# Patient Record
Sex: Male | Born: 1935 | Race: White | Hispanic: No | Marital: Married | Smoking: Former smoker
Health system: Southern US, Community
[De-identification: ages and names within clinical notes are randomized; demographics above are authoritative.]

## PROBLEM LIST (undated history)

## (undated) DIAGNOSIS — E1165 Type 2 diabetes mellitus with hyperglycemia: Secondary | ICD-10-CM

## (undated) DIAGNOSIS — G4730 Sleep apnea, unspecified: Secondary | ICD-10-CM

## (undated) DIAGNOSIS — E785 Hyperlipidemia, unspecified: Secondary | ICD-10-CM

## (undated) DIAGNOSIS — Z87442 Personal history of urinary calculi: Secondary | ICD-10-CM

## (undated) DIAGNOSIS — R251 Tremor, unspecified: Secondary | ICD-10-CM

## (undated) DIAGNOSIS — I429 Cardiomyopathy, unspecified: Secondary | ICD-10-CM

## (undated) DIAGNOSIS — K5790 Diverticulosis of intestine, part unspecified, without perforation or abscess without bleeding: Secondary | ICD-10-CM

## (undated) DIAGNOSIS — G4710 Hypersomnia, unspecified: Secondary | ICD-10-CM

## (undated) DIAGNOSIS — K922 Gastrointestinal hemorrhage, unspecified: Secondary | ICD-10-CM

## (undated) DIAGNOSIS — D649 Anemia, unspecified: Secondary | ICD-10-CM

## (undated) DIAGNOSIS — I2510 Atherosclerotic heart disease of native coronary artery without angina pectoris: Secondary | ICD-10-CM

## (undated) DIAGNOSIS — IMO0001 Reserved for inherently not codable concepts without codable children: Secondary | ICD-10-CM

## (undated) DIAGNOSIS — I10 Essential (primary) hypertension: Secondary | ICD-10-CM

## (undated) DIAGNOSIS — N400 Enlarged prostate without lower urinary tract symptoms: Secondary | ICD-10-CM

## (undated) DIAGNOSIS — E7800 Pure hypercholesterolemia, unspecified: Secondary | ICD-10-CM

## (undated) DIAGNOSIS — I219 Acute myocardial infarction, unspecified: Secondary | ICD-10-CM

## (undated) HISTORY — DX: Diverticulosis of intestine, part unspecified, without perforation or abscess without bleeding: K57.90

## (undated) HISTORY — PX: CARDIAC CATHETERIZATION: SHX172

## (undated) HISTORY — DX: Reserved for inherently not codable concepts without codable children: IMO0001

## (undated) HISTORY — PX: FLEXIBLE SIGMOIDOSCOPY: SHX1649

## (undated) HISTORY — PX: TONSILLECTOMY: SUR1361

## (undated) HISTORY — DX: Type 2 diabetes mellitus with hyperglycemia: E11.65

## (undated) HISTORY — PX: KNEE ARTHROSCOPY: SUR90

## (undated) HISTORY — PX: HERNIA REPAIR: SHX51

## (undated) HISTORY — DX: Acute myocardial infarction, unspecified: I21.9

## (undated) HISTORY — PX: OTHER SURGICAL HISTORY: SHX169

## (undated) HISTORY — DX: Sleep apnea, unspecified: G47.30

## (undated) HISTORY — DX: Atherosclerotic heart disease of native coronary artery without angina pectoris: I25.10

## (undated) HISTORY — DX: Hyperlipidemia, unspecified: E78.5

## (undated) HISTORY — DX: Benign prostatic hyperplasia without lower urinary tract symptoms: N40.0

## (undated) HISTORY — DX: Hypersomnia, unspecified: G47.10

## (undated) HISTORY — DX: Essential (primary) hypertension: I10

## (undated) HISTORY — PX: EYE SURGERY: SHX253

## (undated) HISTORY — DX: Pure hypercholesterolemia, unspecified: E78.00

---

## 1992-06-28 HISTORY — PX: CHOLECYSTECTOMY: SHX55

## 1994-06-28 DIAGNOSIS — I219 Acute myocardial infarction, unspecified: Secondary | ICD-10-CM

## 1994-06-28 HISTORY — DX: Acute myocardial infarction, unspecified: I21.9

## 1994-06-28 HISTORY — PX: CORONARY ANGIOPLASTY: SHX604

## 2009-04-30 ENCOUNTER — Ambulatory Visit: Payer: Self-pay | Admitting: Urology

## 2009-08-05 ENCOUNTER — Ambulatory Visit: Payer: Self-pay | Admitting: Gastroenterology

## 2010-03-04 ENCOUNTER — Ambulatory Visit: Payer: Self-pay | Admitting: Urology

## 2010-03-11 ENCOUNTER — Ambulatory Visit: Payer: Self-pay | Admitting: Urology

## 2010-03-12 LAB — PATHOLOGY REPORT

## 2012-04-10 ENCOUNTER — Ambulatory Visit: Payer: Self-pay | Admitting: Ophthalmology

## 2012-04-10 LAB — POTASSIUM: Potassium: 3.9 mmol/L (ref 3.5–5.1)

## 2012-04-25 ENCOUNTER — Ambulatory Visit: Payer: Self-pay | Admitting: Ophthalmology

## 2012-05-12 ENCOUNTER — Ambulatory Visit: Payer: Self-pay | Admitting: Ophthalmology

## 2012-05-23 ENCOUNTER — Ambulatory Visit: Payer: Self-pay | Admitting: Ophthalmology

## 2012-08-07 DIAGNOSIS — N529 Male erectile dysfunction, unspecified: Secondary | ICD-10-CM | POA: Insufficient documentation

## 2012-08-07 DIAGNOSIS — N138 Other obstructive and reflux uropathy: Secondary | ICD-10-CM | POA: Insufficient documentation

## 2012-08-07 DIAGNOSIS — R972 Elevated prostate specific antigen [PSA]: Secondary | ICD-10-CM | POA: Insufficient documentation

## 2012-08-07 DIAGNOSIS — N401 Enlarged prostate with lower urinary tract symptoms: Secondary | ICD-10-CM | POA: Insufficient documentation

## 2013-06-28 DIAGNOSIS — K922 Gastrointestinal hemorrhage, unspecified: Secondary | ICD-10-CM

## 2013-06-28 HISTORY — DX: Gastrointestinal hemorrhage, unspecified: K92.2

## 2013-07-06 ENCOUNTER — Inpatient Hospital Stay: Payer: Self-pay | Admitting: Internal Medicine

## 2013-07-06 LAB — COMPREHENSIVE METABOLIC PANEL
ALBUMIN: 3.2 g/dL — AB (ref 3.4–5.0)
Alkaline Phosphatase: 85 U/L
Anion Gap: 4 — ABNORMAL LOW (ref 7–16)
BILIRUBIN TOTAL: 1 mg/dL (ref 0.2–1.0)
BUN: 17 mg/dL (ref 7–18)
CALCIUM: 8.8 mg/dL (ref 8.5–10.1)
CHLORIDE: 103 mmol/L (ref 98–107)
CREATININE: 1.3 mg/dL (ref 0.60–1.30)
Co2: 29 mmol/L (ref 21–32)
GFR CALC AF AMER: 58 — AB
GFR CALC NON AF AMER: 50 — AB
GLUCOSE: 182 mg/dL — AB (ref 65–99)
Osmolality: 278 (ref 275–301)
POTASSIUM: 4.2 mmol/L (ref 3.5–5.1)
SGOT(AST): 34 U/L (ref 15–37)
SGPT (ALT): 22 U/L (ref 12–78)
SODIUM: 136 mmol/L (ref 136–145)
TOTAL PROTEIN: 7.7 g/dL (ref 6.4–8.2)

## 2013-07-06 LAB — CBC
HCT: 34.9 % — ABNORMAL LOW (ref 40.0–52.0)
HGB: 11.9 g/dL — ABNORMAL LOW (ref 13.0–18.0)
MCH: 30.5 pg (ref 26.0–34.0)
MCHC: 34.1 g/dL (ref 32.0–36.0)
MCV: 89 fL (ref 80–100)
Platelet: 198 10*3/uL (ref 150–440)
RBC: 3.91 10*6/uL — AB (ref 4.40–5.90)
RDW: 14.3 % (ref 11.5–14.5)
WBC: 7.4 10*3/uL (ref 3.8–10.6)

## 2013-07-07 LAB — HEMOGLOBIN
HGB: 8.9 g/dL — ABNORMAL LOW (ref 13.0–18.0)
HGB: 8.6 g/dL — AB (ref 13.0–18.0)

## 2013-07-07 LAB — CBC WITH DIFFERENTIAL/PLATELET
Basophil #: 0 10*3/uL (ref 0.0–0.1)
Basophil %: 0.4 %
EOS ABS: 0.2 10*3/uL (ref 0.0–0.7)
EOS PCT: 1.4 %
HCT: 27.7 % — ABNORMAL LOW (ref 40.0–52.0)
HGB: 9.3 g/dL — ABNORMAL LOW (ref 13.0–18.0)
LYMPHS ABS: 1.6 10*3/uL (ref 1.0–3.6)
Lymphocyte %: 15 %
MCH: 29.8 pg (ref 26.0–34.0)
MCHC: 33.7 g/dL (ref 32.0–36.0)
MCV: 89 fL (ref 80–100)
MONO ABS: 1.4 x10 3/mm — AB (ref 0.2–1.0)
MONOS PCT: 12.5 %
Neutrophil #: 7.7 10*3/uL — ABNORMAL HIGH (ref 1.4–6.5)
Neutrophil %: 70.7 %
Platelet: 152 10*3/uL (ref 150–440)
RBC: 3.14 10*6/uL — ABNORMAL LOW (ref 4.40–5.90)
RDW: 14.4 % (ref 11.5–14.5)
WBC: 10.9 10*3/uL — AB (ref 3.8–10.6)

## 2013-07-07 LAB — BASIC METABOLIC PANEL
ANION GAP: 4 — AB (ref 7–16)
BUN: 20 mg/dL — AB (ref 7–18)
CALCIUM: 8.1 mg/dL — AB (ref 8.5–10.1)
Chloride: 107 mmol/L (ref 98–107)
Co2: 29 mmol/L (ref 21–32)
Creatinine: 1.23 mg/dL (ref 0.60–1.30)
EGFR (African American): >60
GFR CALC NON AF AMER: 54 — AB
GLUCOSE: 148 mg/dL — AB (ref 65–99)
Osmolality: 285 (ref 275–301)
Potassium: 3.9 mmol/L (ref 3.5–5.1)
Sodium: 140 mmol/L (ref 136–145)

## 2013-07-07 LAB — HEMATOCRIT
HCT: 26.1 % — AB (ref 40.0–52.0)
HCT: 25.6 % — ABNORMAL LOW (ref 40.0–52.0)
HCT: 25.2 % — AB (ref 40.0–52.0)

## 2013-07-08 DIAGNOSIS — I4891 Unspecified atrial fibrillation: Secondary | ICD-10-CM

## 2013-07-08 LAB — CBC WITH DIFFERENTIAL/PLATELET
BASOS ABS: 0.1 10*3/uL (ref 0.0–0.1)
Basophil %: 0.6 %
EOS PCT: 2.5 %
Eosinophil #: 0.3 10*3/uL (ref 0.0–0.7)
HCT: 25.9 % — ABNORMAL LOW (ref 40.0–52.0)
HGB: 8.8 g/dL — ABNORMAL LOW (ref 13.0–18.0)
Lymphocyte #: 1.6 10*3/uL (ref 1.0–3.6)
Lymphocyte %: 14.5 %
MCH: 30.2 pg (ref 26.0–34.0)
MCHC: 34.1 g/dL (ref 32.0–36.0)
MCV: 88 fL (ref 80–100)
MONO ABS: 1.5 x10 3/mm — AB (ref 0.2–1.0)
MONOS PCT: 13.6 %
NEUTROS PCT: 68.8 %
Neutrophil #: 7.7 10*3/uL — ABNORMAL HIGH (ref 1.4–6.5)
PLATELETS: 141 10*3/uL — AB (ref 150–440)
RBC: 2.93 10*6/uL — ABNORMAL LOW (ref 4.40–5.90)
RDW: 14.3 % (ref 11.5–14.5)
WBC: 11.2 10*3/uL — ABNORMAL HIGH (ref 3.8–10.6)

## 2013-07-08 LAB — BASIC METABOLIC PANEL
ANION GAP: 4 — AB (ref 7–16)
BUN: 12 mg/dL (ref 7–18)
CALCIUM: 8 mg/dL — AB (ref 8.5–10.1)
CO2: 28 mmol/L (ref 21–32)
CREATININE: 1.18 mg/dL (ref 0.60–1.30)
Chloride: 106 mmol/L (ref 98–107)
EGFR (African American): >60
EGFR (Non-African Amer.): 57 — ABNORMAL LOW
GLUCOSE: 146 mg/dL — AB (ref 65–99)
OSMOLALITY: 278 (ref 275–301)
POTASSIUM: 4 mmol/L (ref 3.5–5.1)
Sodium: 138 mmol/L (ref 136–145)

## 2013-07-08 LAB — TSH: Thyroid Stimulating Horm: 1.77 u[IU]/mL

## 2013-07-27 ENCOUNTER — Encounter: Payer: Self-pay | Admitting: Cardiovascular Disease

## 2013-07-27 ENCOUNTER — Encounter (INDEPENDENT_AMBULATORY_CARE_PROVIDER_SITE_OTHER): Payer: Self-pay

## 2013-07-27 ENCOUNTER — Ambulatory Visit (INDEPENDENT_AMBULATORY_CARE_PROVIDER_SITE_OTHER): Payer: Medicare Other | Admitting: Cardiovascular Disease

## 2013-07-27 VITALS — BP 120/54 | HR 76 | Ht 65.0 in | Wt 196.8 lb

## 2013-07-27 DIAGNOSIS — K922 Gastrointestinal hemorrhage, unspecified: Secondary | ICD-10-CM

## 2013-07-27 DIAGNOSIS — E118 Type 2 diabetes mellitus with unspecified complications: Secondary | ICD-10-CM

## 2013-07-27 DIAGNOSIS — E785 Hyperlipidemia, unspecified: Secondary | ICD-10-CM

## 2013-07-27 DIAGNOSIS — I5032 Chronic diastolic (congestive) heart failure: Secondary | ICD-10-CM

## 2013-07-27 DIAGNOSIS — I4891 Unspecified atrial fibrillation: Secondary | ICD-10-CM

## 2013-07-27 DIAGNOSIS — Z9861 Coronary angioplasty status: Secondary | ICD-10-CM

## 2013-07-27 DIAGNOSIS — I509 Heart failure, unspecified: Secondary | ICD-10-CM

## 2013-07-27 DIAGNOSIS — I2510 Atherosclerotic heart disease of native coronary artery without angina pectoris: Secondary | ICD-10-CM

## 2013-07-27 DIAGNOSIS — E669 Obesity, unspecified: Secondary | ICD-10-CM | POA: Insufficient documentation

## 2013-07-27 DIAGNOSIS — R0789 Other chest pain: Secondary | ICD-10-CM

## 2013-07-27 NOTE — Patient Instructions (Signed)
You are doing well. No medication changes were made.  Please call us if you have new issues that need to be addressed before your next appt.  Your physician wants you to follow-up in: 3 to 4 weeks

## 2013-07-27 NOTE — Assessment & Plan Note (Signed)
Encouraged him to stay on his diuretic regiment. Discussion about heart failure diet

## 2013-07-27 NOTE — Assessment & Plan Note (Signed)
We have encouraged continued exercise, careful diet management in an effort to lose weight. 

## 2013-07-27 NOTE — Assessment & Plan Note (Signed)
Long discussion about various options for him and his atrial fibrillation. Given recent GI bleed 2 weeks ago, he is not a good candidate for anticoagulation at this time. Heart rate is well controlled. Will repeat again in several weeks to discuss how he feels and whether he would like to pursue anticoagulation, possible TEE, and cardioversion

## 2013-07-27 NOTE — Assessment & Plan Note (Signed)
Seen by GI in the hospital. Suspected diverticulitis. We'll hold off on any anticoagulation at this time

## 2013-07-27 NOTE — Progress Notes (Signed)
Patient ID: [name], male    DOB: Sep 19, 1927, 80 y.o.   MRN: [id number]  HPI Comments: [name] is a 80 year old gentleman presents for new patient evaluation. He is a history of CAD, angioplasty in August 1996. He had a recent hospitalization 07/07/2013 for diverticular bleed. He was evaluated by cardiology during this hospital admission for atrial fibrillation. He was given antibiotics for diverticulitis. Seen by Dr. Mechele Collin. No colonoscopy was performed. He was not started on anticoagulation by cardiology given his GI bleeding. He was started on other medications for heart rate control. He was discharged and presents today for followup.  He reports having dizziness while playing golf 05/16/2013. He felt he was having the flu. Seen by Dr. Dareen Piano and given prednisone with antibiotics. Uncertain if symptoms improved. Developed blood per rectum in January 2015 . He was anemic in the hospital, hemoglobin in the 9 range  Notes provided shows echocardiogram 06/26/2013 showing ejection fraction 45-50%, right ventricular systolic pressure 68 mmHg. He was instructed to increase his torsemide to 40 mg alternating with 20 mg Repeat echocardiogram July 08 2013 showed right ventricular systolic pressure 49, normal ejection fraction  In followup today he reports that he feels better but still has some shortness of breath with malaise. He does report having some memory issues. Daughter presents with him today with his wife. He continues to take high-dose aspirin as he has been taking for many years.  Notes provided by patient today shows normal stress test March 2009, normal ejection fraction by echocardiogram March 2009 He had a PTCI of a diagonal branch and OM in 1996  Recent creatinine 1.23 with BUN 20, TSH 1.77, normal LFTs  EKG today shows atrial fibrillation with ventricular rate 7 6 beats per minute, no significant ST or T wave changes  Allergies; 2 Exforge in 2008 with tongue  swelling, rash     Outpatient Encounter Prescriptions as of 07/27/2013  Medication Sig  . aspirin 325 MG tablet Take 325 mg by mouth daily.  . carvedilol (COREG) 25 MG tablet Take 25 mg by mouth 2 (two) times daily with a meal.  . dutasteride (AVODART) 0.5 MG capsule Take 0.5 mg by mouth daily.  Marland Kitchen glipiZIDE (GLUCOTROL) 10 MG tablet Take 10 mg by mouth daily before breakfast.  . glucose blood test strip 1 each by Other route as needed for other. Use as instructed  . Insulin Detemir (LEVEMIR FLEXPEN) 100 UNIT/ML Pen Inject 12 Units into the skin daily at 10 pm.  . losartan (COZAAR) 100 MG tablet Take 100 mg by mouth daily.  Marland Kitchen lovastatin (MEVACOR) 10 MG tablet Take 10 mg by mouth at bedtime.  . Magnesium 200 MG TABS Take 200 mg by mouth daily.   . potassium chloride (K-DUR) 10 MEQ tablet Take 10 mEq by mouth daily.  . sitaGLIPtin-metformin (JANUMET) 50-1000 MG per tablet Take 1 tablet by mouth 2 (two) times daily with a meal.  . torsemide (DEMADEX) 20 MG tablet Takes 40 mg every two days.  . [DISCONTINUED] pioglitazone (ACTOS) 30 MG tablet Take 30 mg by mouth daily.     Review of Systems  HENT: Negative.   Eyes: Negative.   Respiratory: Positive for shortness of breath.   Cardiovascular: Negative.   Gastrointestinal: Negative.   Endocrine: Negative.   Musculoskeletal: Negative.   Skin: Negative.   Allergic/Immunologic: Negative.   Neurological: Positive for weakness.  Hematological: Negative.   Psychiatric/Behavioral: Negative.   All other systems reviewed and are negative.  BP 120/54  Pulse 76  Ht 5\' 5"  (1.651 m)  Wt 196 lb 12 oz (89.245 kg)  BMI 32.74 kg/m2  Physical Exam  Nursing note and vitals reviewed. Constitutional: He is oriented to person, place, and time. He appears well-developed and well-nourished.  HENT:  Head: Normocephalic.  Nose: Nose normal.  Mouth/Throat: Oropharynx is clear and moist.  Eyes: Conjunctivae are normal. Pupils are equal, round, and  reactive to light.  Neck: Normal range of motion. Neck supple. No JVD present.  Cardiovascular: Normal rate, regular rhythm, S1 normal, S2 normal, normal heart sounds and intact distal pulses.  Exam reveals no gallop and no friction rub.   No murmur heard. Pulmonary/Chest: Effort normal and breath sounds normal. No respiratory distress. He has no wheezes. He has no rales. He exhibits no tenderness.  Abdominal: Soft. Bowel sounds are normal. He exhibits no distension. There is no tenderness.  Musculoskeletal: Normal range of motion. He exhibits no edema and no tenderness.  Lymphadenopathy:    He has no cervical adenopathy.  Neurological: He is alert and oriented to person, place, and time. Coordination normal.  Skin: Skin is warm and dry. No rash noted. No erythema.  Psychiatric: He has a normal mood and affect. His behavior is normal. Judgment and thought content normal.      Assessment and Plan

## 2013-07-27 NOTE — Assessment & Plan Note (Signed)
Encouraged weight loss and diet

## 2013-07-27 NOTE — Assessment & Plan Note (Signed)
Encouraged him to stay on his lovastatin. Goal LDL less than 70 

## 2013-07-27 NOTE — Assessment & Plan Note (Signed)
He is provided images today that suggests angioplasty to the OM and diagonal vessel

## 2013-07-27 NOTE — Assessment & Plan Note (Signed)
Prior angioplasty in 1996 of the OM vessel, diagonal vessel. Denies any recent angina.

## 2013-08-17 ENCOUNTER — Ambulatory Visit (INDEPENDENT_AMBULATORY_CARE_PROVIDER_SITE_OTHER): Payer: Medicare Other | Admitting: Cardiovascular Disease

## 2013-08-17 ENCOUNTER — Encounter: Payer: Self-pay | Admitting: Cardiovascular Disease

## 2013-08-17 VITALS — BP 142/60 | HR 61 | Ht 65.0 in | Wt 193.8 lb

## 2013-08-17 DIAGNOSIS — E118 Type 2 diabetes mellitus with unspecified complications: Secondary | ICD-10-CM

## 2013-08-17 DIAGNOSIS — R002 Palpitations: Secondary | ICD-10-CM

## 2013-08-17 DIAGNOSIS — E785 Hyperlipidemia, unspecified: Secondary | ICD-10-CM

## 2013-08-17 DIAGNOSIS — I4891 Unspecified atrial fibrillation: Secondary | ICD-10-CM

## 2013-08-17 DIAGNOSIS — I509 Heart failure, unspecified: Secondary | ICD-10-CM

## 2013-08-17 DIAGNOSIS — R0602 Shortness of breath: Secondary | ICD-10-CM

## 2013-08-17 DIAGNOSIS — I2510 Atherosclerotic heart disease of native coronary artery without angina pectoris: Secondary | ICD-10-CM

## 2013-08-17 DIAGNOSIS — I5032 Chronic diastolic (congestive) heart failure: Secondary | ICD-10-CM

## 2013-08-17 NOTE — Assessment & Plan Note (Signed)
Currently with no symptoms of angina. No further workup at this time. Continue current medication regimen. 

## 2013-08-17 NOTE — Assessment & Plan Note (Signed)
Long discussion about management of his atrial fibrillation. He is symptomatic though uncertain if this is from atrial fibrillation or continued heart failure symptoms, or from anemia or combination of all of the above. He's not a good candidate at this time for aggressive anticoagulation and we have discussed this with him. Very little buffer as he continues to be very anemic. He was recently started on iron. We have recommended that we wait several weeks until blood count improves. It would certainly be his choice as to whether he would go on anticoagulation as this could set him back if he has another bleed. Ideally we could see how he feels once anemia improves, heart failure improves and if he feels okay, could possibly use low dose anticoagulation such as eliquis 2.5 g twice a day and have him stay in atrial fibrillation. Alternatively we would need anticoagulation with TEE cardioversion and one more month of anticoagulation following this (eliquis 5 mg BID)

## 2013-08-17 NOTE — Assessment & Plan Note (Signed)
We have encouraged continued exercise, careful diet management in an effort to lose weight. 

## 2013-08-17 NOTE — Assessment & Plan Note (Signed)
Recommended he increase his torsemide to 40 mg daily, decrease his fluid intake

## 2013-08-17 NOTE — Assessment & Plan Note (Signed)
Recommended he stay on his lovastatin. Goal LDL less than 70 

## 2013-08-17 NOTE — Progress Notes (Signed)
Patient ID: [name], male    DOB: 09/29/1927, 80 y.o.   MRN: [id number]  HPI Comments: [name] is a 80 year old gentleman presents for routine followup of his atrial fibrillation. He is a history of CAD, angioplasty in August 1996. PTCA of a diagonal branch and OM in 1996 He had a recent hospitalization 07/07/2013 for diverticular bleed, atrial fibrillation. He was given antibiotics for diverticulitis. Seen by Dr. Mechele CollinElliott. No colonoscopy was performed. He was not started on anticoagulation by cardiology given his GI bleeding.  He reports having dizziness while playing golf 05/16/2013. He felt he was having the flu. Seen by Dr. Dareen PianoAnderson and given prednisone with antibiotics. Uncertain if symptoms improved. Developed blood per rectum in January 2015 . He was anemic in the hospital, hemoglobin in the 9 range  Notes provided shows echocardiogram 06/26/2013 showing ejection fraction 45-50%, right ventricular systolic pressure 68 mmHg. He was instructed to increase his torsemide to 40 mg alternating with 20 mg Repeat echocardiogram July 08 2013 showed right ventricular systolic pressure 49, normal ejection fraction  In followup today, he reports that he continues to have leg edema. Hemoglobin is 8.7. Continues to alternate torsemide 40 mg with 20 mg. He does not like having occasional palpitations. Feels his energy is poor, overall feels weak. Wife reports that he's not doing any walking or exercise. He feels he has some abdominal swelling/bloating. He reports having obstructive sleep apnea, not wearing his mask. Reports that [facility] is supposed to get back in touch with him.    He does report having some memory issues.    normal stress test March 2009, normal ejection fraction by echocardiogram March 2009 He had a PTCA of a diagonal branch and OM in 1996  Recent creatinine 1.23 with BUN 20, TSH 1.77, normal LFTs total cholesterol 99, LDL 50, HDL 32, hemoglobin A1c 7.3, hemoglobin 8.7     EKG shows atrial fibrillation with ventricular rate 61 beats per minute, nonspecific ST abnormality  EKG today shows atrial fibrillation with ventricular rate 7 6 beats per minute, no significant ST or T wave changes  Allergies; 2 Exforge in 2008 with tongue swelling, rash     Outpatient Encounter Prescriptions as of 08/17/2013  Medication Sig  . aspirin 325 MG tablet Take 325 mg by mouth daily.  . carvedilol (COREG) 25 MG tablet Take 25 mg by mouth 2 (two) times daily with a meal.  . dutasteride (AVODART) 0.5 MG capsule Take 0.5 mg by mouth 3 (three) times a week.   Marland Kitchen. glipiZIDE (GLUCOTROL) 10 MG tablet Take 10 mg by mouth daily before breakfast.  . glucose blood test strip 1 each by Other route as needed for other. Use as instructed  . Insulin Detemir (LEVEMIR FLEXPEN) 100 UNIT/ML Pen Inject 12 Units into the skin daily at 10 pm.  . IRON, FERROUS GLUCONATE, PO Take 150 mg by mouth 2 (two) times daily.  Marland Kitchen. losartan (COZAAR) 100 MG tablet Take 100 mg by mouth daily.  Marland Kitchen. lovastatin (MEVACOR) 10 MG tablet Take 10 mg by mouth at bedtime.  . Magnesium 200 MG TABS Take 200 mg by mouth daily.   . potassium chloride (K-DUR) 10 MEQ tablet Take 10 mEq by mouth daily.  . sitaGLIPtin-metformin (JANUMET) 50-1000 MG per tablet Take 1 tablet by mouth 2 (two) times daily with a meal.  . torsemide (DEMADEX) 20 MG tablet Takes 40 mg every two days.    Review of Systems  HENT: Negative.   Eyes: Negative.  Respiratory: Positive for shortness of breath.   Cardiovascular: Positive for palpitations.  Gastrointestinal: Negative.   Endocrine: Negative.   Musculoskeletal: Negative.   Skin: Negative.   Allergic/Immunologic: Negative.   Neurological: Positive for weakness.  Hematological: Negative.   Psychiatric/Behavioral: Negative.   All other systems reviewed and are negative.    BP 142/60  Pulse 61  Ht 5\' 5"  (1.651 m)  Wt 193 lb 12.8 oz (87.907 kg)  BMI 32.25 kg/m2  Physical Exam  Nursing  note and vitals reviewed. Constitutional: He is oriented to person, place, and time. He appears well-developed and well-nourished.  HENT:  Head: Normocephalic.  Nose: Nose normal.  Mouth/Throat: Oropharynx is clear and moist.  Eyes: Conjunctivae are normal. Pupils are equal, round, and reactive to light.  Neck: Normal range of motion. Neck supple. No JVD present.  Cardiovascular: Normal rate, regular rhythm, S1 normal, S2 normal, normal heart sounds and intact distal pulses.  Exam reveals no gallop and no friction rub.   No murmur heard. Pulmonary/Chest: Effort normal and breath sounds normal. No respiratory distress. He has no wheezes. He has no rales. He exhibits no tenderness.  Abdominal: Soft. Bowel sounds are normal. He exhibits no distension. There is no tenderness.  Musculoskeletal: Normal range of motion. He exhibits no edema and no tenderness.  Lymphadenopathy:    He has no cervical adenopathy.  Neurological: He is alert and oriented to person, place, and time. Coordination normal.  Skin: Skin is warm and dry. No rash noted. No erythema.  Psychiatric: He has a normal mood and affect. His behavior is normal. Judgment and thought content normal.      Assessment and Plan

## 2013-08-17 NOTE — Patient Instructions (Signed)
Your shortness of breath is from anemia and extra fluid, possibly also from atrial fibrillation  We will wait until the next lab work to determine what to do with the atrial fibrillation Take torsemide 40 mg daily If breathing gets worse, leg swelling worse on any particular day,  take an extra torsemide 40 mg after lunch  If the blood count comes way back up,  We could consider a blood thinner for a short period   Walk around the house at least three times a day for at least 5 minutes for exercise (otherwise you will get weaker) Also do chair exercises, light weight  Please call us if you have new issues that need to be addressed before your next appt.  Your physician wants you to follow-up in: 1 month.

## 2013-09-14 ENCOUNTER — Ambulatory Visit: Payer: Medicare Other | Admitting: Cardiovascular Disease

## 2013-09-21 ENCOUNTER — Ambulatory Visit (INDEPENDENT_AMBULATORY_CARE_PROVIDER_SITE_OTHER): Payer: Medicare Other | Admitting: Cardiovascular Disease

## 2013-09-21 ENCOUNTER — Encounter: Payer: Self-pay | Admitting: Cardiovascular Disease

## 2013-09-21 VITALS — BP 122/60 | Ht 65.0 in | Wt 199.2 lb

## 2013-09-21 DIAGNOSIS — R0602 Shortness of breath: Secondary | ICD-10-CM

## 2013-09-21 DIAGNOSIS — E785 Hyperlipidemia, unspecified: Secondary | ICD-10-CM

## 2013-09-21 DIAGNOSIS — E669 Obesity, unspecified: Secondary | ICD-10-CM

## 2013-09-21 DIAGNOSIS — I509 Heart failure, unspecified: Secondary | ICD-10-CM

## 2013-09-21 DIAGNOSIS — E118 Type 2 diabetes mellitus with unspecified complications: Secondary | ICD-10-CM

## 2013-09-21 DIAGNOSIS — I2510 Atherosclerotic heart disease of native coronary artery without angina pectoris: Secondary | ICD-10-CM

## 2013-09-21 DIAGNOSIS — I5032 Chronic diastolic (congestive) heart failure: Secondary | ICD-10-CM

## 2013-09-21 DIAGNOSIS — K922 Gastrointestinal hemorrhage, unspecified: Secondary | ICD-10-CM

## 2013-09-21 DIAGNOSIS — I4891 Unspecified atrial fibrillation: Secondary | ICD-10-CM

## 2013-09-21 NOTE — Assessment & Plan Note (Signed)
Heart rate well controlled. Unable to start anticoagulation given recent GI bleed approximately 2 weeks ago. Bleeding on and off for several days. He does not want a thinner and I believe it is risky

## 2013-09-21 NOTE — Assessment & Plan Note (Signed)
Cholesterol is at goal on the current lipid regimen. No changes to the medications were made.  

## 2013-09-21 NOTE — Progress Notes (Signed)
Patient ID: [name], male    DOB: 12/12/1927, 80 y.o.   MRN: [id number]  HPI Comments: [name] is a 80 year old gentleman presents for routine followup of his atrial fibrillation. history of CAD, angioplasty in August 1996. PTCA of a diagonal branch and OM in 1996  hospitalization 07/07/2013 for diverticular bleed, atrial fibrillation. He was given antibiotics for diverticulitis. Seen by Dr. Mechele CollinElliott. No colonoscopy was performed. He was not started on anticoagulation by cardiology given his GI bleeding.   dizziness started while playing golf 05/16/2013. He felt he was having the flu. Seen by Dr. Dareen PianoAnderson and given prednisone with antibiotics. Uncertain if symptoms improved. Developed blood per rectum in January 2015 . He was anemic in the hospital, hemoglobin in the 9 range   echocardiogram 06/26/2013 showing ejection fraction 45-50%, right ventricular systolic pressure 68 mmHg. He was instructed to increase his torsemide to 40 mg alternating with 20 mg Repeat echocardiogram July 08 2013 showed right ventricular systolic pressure 49, normal ejection fraction  On his last clinic visit, he had continued leg edema, hemoglobin 8.7 He continued to alternate torsemide 40 mg with 20 mg. he felt weak at that time with abdominal bloating Poorly controlled obstructive sleep apnea, not wearing his mask.   In followup today, still not wearing CPAP mask, has daytime somnolence Leg edema about the same, he is now to torsemide 40 mg daily. Heart rate relatively well controlled. He is sedentary, not doing any regular exercise. Some shortness of breath with exertion he does report having recent (M. on March 22 lasting for several days on and off. Finally resolved. Latest hemoglobin 10.6 one week ago  He does report having some memory issues.    normal stress test March 2009, normal ejection fraction by echocardiogram March 2009 He had a PTCA of a diagonal branch and OM in 1996  EKG shows  atrial fibrillation with ventricular rate 54 beats per minute, nonspecific ST abnormality  Allergies; 2 Exforge in 2008 with tongue swelling, rash     Outpatient Encounter Prescriptions as of 09/21/2013  Medication Sig  . aspirin 325 MG tablet Take 325 mg by mouth daily.  . carvedilol (COREG) 25 MG tablet Take 25 mg by mouth 2 (two) times daily with a meal.  . dutasteride (AVODART) 0.5 MG capsule Take 0.5 mg by mouth 3 (three) times a week.   Marland Kitchen. glipiZIDE (GLUCOTROL) 10 MG tablet Take 10 mg by mouth daily before breakfast.  . glucose blood test strip 1 each by Other route as needed for other. Use as instructed  . Insulin Detemir (LEVEMIR FLEXPEN) 100 UNIT/ML Pen Inject 12 Units into the skin daily at 10 pm.  . IRON, FERROUS GLUCONATE, PO Take 150 mg by mouth 2 (two) times daily.  Marland Kitchen. losartan (COZAAR) 100 MG tablet Take 100 mg by mouth daily.  Marland Kitchen. lovastatin (MEVACOR) 10 MG tablet Take 10 mg by mouth at bedtime.  . Magnesium 200 MG TABS Take 200 mg by mouth daily.   . potassium chloride (K-DUR) 10 MEQ tablet Take 10 mEq by mouth daily.  . sitaGLIPtin-metformin (JANUMET) 50-1000 MG per tablet Take 1 tablet by mouth 2 (two) times daily with a meal.  . torsemide (DEMADEX) 20 MG tablet Takes 40 mg every day.    Review of Systems  HENT: Negative.   Eyes: Negative.   Respiratory: Positive for shortness of breath.   Cardiovascular: Positive for palpitations.  Gastrointestinal: Negative.   Endocrine: Negative.   Skin: Negative.   Allergic/Immunologic:  Negative.   Neurological: Positive for weakness.  Hematological: Negative.   Psychiatric/Behavioral: Negative.   All other systems reviewed and are negative.    BP 122/60  Ht 5\' 5"  (1.651 m)  Wt 199 lb 4 oz (90.379 kg)  BMI 33.16 kg/m2  Physical Exam  Nursing note and vitals reviewed. Constitutional: He is oriented to person, place, and time. He appears well-developed and well-nourished.  HENT:  Head: Normocephalic.  Nose: Nose  normal.  Mouth/Throat: Oropharynx is clear and moist.  Eyes: Conjunctivae are normal. Pupils are equal, round, and reactive to light.  Neck: Normal range of motion. Neck supple. No JVD present.  Cardiovascular: Normal rate, regular rhythm, S1 normal, S2 normal, normal heart sounds and intact distal pulses.  Exam reveals no gallop and no friction rub.   No murmur heard. Pulmonary/Chest: Effort normal and breath sounds normal. No respiratory distress. He has no wheezes. He has no rales. He exhibits no tenderness.  Abdominal: Soft. Bowel sounds are normal. He exhibits no distension. There is no tenderness.  Musculoskeletal: Normal range of motion. He exhibits no edema and no tenderness.  Lymphadenopathy:    He has no cervical adenopathy.  Neurological: He is alert and oriented to person, place, and time. Coordination normal.  Skin: Skin is warm and dry. No rash noted. No erythema.  Psychiatric: He has a normal mood and affect. His behavior is normal. Judgment and thought content normal.      Assessment and Plan

## 2013-09-21 NOTE — Assessment & Plan Note (Signed)
Currently with no symptoms of angina. No further workup at this time. Continue current medication regimen. 

## 2013-09-21 NOTE — Assessment & Plan Note (Signed)
Recommended slow and steady weight loss by watching his diet

## 2013-09-21 NOTE — Patient Instructions (Signed)
You are doing well. No medication changes were made.  Please call us if you have new issues that need to be addressed before your next appt.  Your physician wants you to follow-up in: 3 months You will receive a reminder letter in the mail two months in advance. If you don't receive a letter, please call our office to schedule the follow-up appointment.   

## 2013-09-21 NOTE — Assessment & Plan Note (Signed)
Currently taking torsemide 40 mg daily. Suggested he try to minimize his fluid intake. Some of his leg edema could be secondary to venous insufficiency.

## 2013-09-21 NOTE — Assessment & Plan Note (Signed)
Recurrent GI bleed on March 22 on and off for several days. Suggested no more than aspirin 81 mg x2. We'll not start eliquis given recent bleeding even at low dose

## 2013-09-21 NOTE — Assessment & Plan Note (Signed)
We have encouraged continued exercise, careful diet management in an effort to lose weight. 

## 2013-10-29 ENCOUNTER — Ambulatory Visit: Payer: Self-pay | Admitting: Gastroenterology

## 2013-11-21 DIAGNOSIS — G4733 Obstructive sleep apnea (adult) (pediatric): Secondary | ICD-10-CM | POA: Insufficient documentation

## 2013-11-21 DIAGNOSIS — E7800 Pure hypercholesterolemia, unspecified: Secondary | ICD-10-CM | POA: Insufficient documentation

## 2013-11-21 DIAGNOSIS — I429 Cardiomyopathy, unspecified: Secondary | ICD-10-CM | POA: Insufficient documentation

## 2013-12-19 ENCOUNTER — Ambulatory Visit (INDEPENDENT_AMBULATORY_CARE_PROVIDER_SITE_OTHER): Payer: Medicare Other | Admitting: Cardiovascular Disease

## 2013-12-19 ENCOUNTER — Encounter: Payer: Self-pay | Admitting: Cardiovascular Disease

## 2013-12-19 VITALS — BP 90/52 | HR 42 | Ht 65.0 in | Wt 172.5 lb

## 2013-12-19 DIAGNOSIS — I509 Heart failure, unspecified: Secondary | ICD-10-CM

## 2013-12-19 DIAGNOSIS — I4891 Unspecified atrial fibrillation: Secondary | ICD-10-CM

## 2013-12-19 DIAGNOSIS — E785 Hyperlipidemia, unspecified: Secondary | ICD-10-CM

## 2013-12-19 DIAGNOSIS — I10 Essential (primary) hypertension: Secondary | ICD-10-CM

## 2013-12-19 DIAGNOSIS — I5032 Chronic diastolic (congestive) heart failure: Secondary | ICD-10-CM

## 2013-12-19 DIAGNOSIS — I2510 Atherosclerotic heart disease of native coronary artery without angina pectoris: Secondary | ICD-10-CM

## 2013-12-19 DIAGNOSIS — E118 Type 2 diabetes mellitus with unspecified complications: Secondary | ICD-10-CM

## 2013-12-19 MED ORDER — METOLAZONE 5 MG PO TABS: 5.0000 mg | ORAL_TABLET | Freq: Every day | ORAL | Status: DC | PRN

## 2013-12-19 MED ORDER — INSULIN PEN NEEDLE 31G X 6 MM MISC: Status: DC

## 2013-12-19 NOTE — Assessment & Plan Note (Signed)
Weight is dramatically improved. Overall he feels much better. He would like to stop metolazone as his weight has dropped dramatically over the past month, down 25 pounds. We have suggested he take metolazone only for weight in the high 170 range. Continue on torsemide 40 mg daily for now

## 2013-12-19 NOTE — Assessment & Plan Note (Signed)
We have encouraged continued exercise, careful diet management in an effort to lose weight. 

## 2013-12-19 NOTE — Progress Notes (Signed)
Patient ID: [name], male    DOB: 12-25-1927, 80 y.o.   MRN: [id number]  HPI Comments: [name] is a 80 year old gentleman presents for routine followup of his atrial fibrillation. history of CAD, angioplasty in August 1996. PTCA of a diagonal branch and OM in 1996  hospitalization 07/07/2013 for diverticular bleed, atrial fibrillation. He was given antibiotics for diverticulitis. Seen by Dr. Mechele Collin. No colonoscopy was performed. He was not started on anticoagulation by cardiology given his GI bleeding.   dizziness started while playing golf 05/16/2013. He felt he was having the flu. Seen by Dr. Dareen Piano and given prednisone with antibiotics. Uncertain if symptoms improved. Developed blood per rectum in January 2015 . He was anemic in the hospital, hemoglobin in the 9 range   echocardiogram 06/26/2013 showing ejection fraction 45-50%, right ventricular systolic pressure 68 mmHg. He was instructed to increase his torsemide to 40 mg alternating with 20 mg Repeat echocardiogram July 08 2013 showed right ventricular systolic pressure 49, normal ejection fraction  On his last clinic visit,  hemoglobin 8.7 Poorly controlled obstructive sleep apnea, not wearing his mask.   In followup today, he reports that metolazone has worked very well for him. He has been taking 5 mg 2 days per week for the past several weeks. He would like to stop the medication at this time. Weight is down from 199 pounds down to 172.8 pounds per our scale. Edema has resolved, overall he feels much better. He has started weighing himself daily. Breathing has improved, now his pants will fit. He continues to take torsemide 40 mg daily. He does report having some recent orthostasis On prior clinic visit, He does report having some memory issues.    normal stress test March 2009, normal ejection fraction by echocardiogram March 2009 He had a PTCA of a diagonal branch and OM in 1996  EKG shows atrial fibrillation  with ventricular rate 42 beats per minute, nonspecific ST abnormality. Repeat EKG with heart rate 51 beats per minute  Allergies; 2 Exforge in 2008 with tongue swelling, rash     Outpatient Encounter Prescriptions as of 12/19/2013  Medication Sig  . aspirin 325 MG tablet Take 325 mg by mouth daily.  . carvedilol (COREG) 25 MG tablet Take 25 mg by mouth 2 (two) times daily with a meal.  . Coenzyme Q10 (CO Q 10) 100 MG CAPS Take 200 mg by mouth daily.  Marland Kitchen dutasteride (AVODART) 0.5 MG capsule Take 0.5 mg by mouth 3 (three) times a week.   Marland Kitchen glipiZIDE (GLUCOTROL) 10 MG tablet Take 10 mg by mouth daily before breakfast.  . glucose blood test strip 1 each by Other route as needed for other. Use as instructed  . Insulin Detemir (LEVEMIR FLEXPEN) 100 UNIT/ML Pen Inject 12 Units into the skin daily at 10 pm.  . Insulin Detemir (LEVEMIR FLEXPEN) 100 UNIT/ML Pen Inject into the skin.  . IRON, FERROUS GLUCONATE, PO Take 150 mg by mouth 2 (two) times daily.  Marland Kitchen losartan (COZAAR) 100 MG tablet Take 100 mg by mouth daily.  Marland Kitchen lovastatin (MEVACOR) 10 MG tablet Take 10 mg by mouth at bedtime.  . Magnesium 200 MG TABS Take 200 mg by mouth daily.   . metolazone (ZAROXOLYN) 5 MG tablet Take 5 mg by mouth 2 (two) times a week.  . potassium chloride (K-DUR) 10 MEQ tablet Take 20 mEq by mouth daily.   . sitaGLIPtin-metformin (JANUMET) 50-1000 MG per tablet Take 1 tablet by mouth 2 (two) times  daily with a meal.  . torsemide (DEMADEX) 20 MG tablet Takes 40 mg every day.    Review of Systems  HENT: Negative.   Eyes: Negative.   Respiratory: Positive for shortness of breath.   Cardiovascular: Positive for palpitations.  Gastrointestinal: Negative.   Endocrine: Negative.   Musculoskeletal: Negative.   Skin: Negative.   Allergic/Immunologic: Negative.   Neurological: Positive for dizziness.  Hematological: Negative.   Psychiatric/Behavioral: Negative.   All other systems reviewed and are negative.   BP  90/52  Ht 5\' 5"  (1.651 m)  Wt 172 lb 8 oz (78.245 kg)  BMI 28.71 kg/m2  Physical Exam  Nursing note and vitals reviewed. Constitutional: He is oriented to person, place, and time. He appears well-developed and well-nourished.  HENT:  Head: Normocephalic.  Nose: Nose normal.  Mouth/Throat: Oropharynx is clear and moist.  Eyes: Conjunctivae are normal. Pupils are equal, round, and reactive to light.  Neck: Normal range of motion. Neck supple. No JVD present.  Cardiovascular: S1 normal, S2 normal, normal heart sounds and intact distal pulses.  An irregularly irregular rhythm present. Bradycardia present.  Exam reveals no gallop and no friction rub.   No murmur heard. Pulmonary/Chest: Effort normal and breath sounds normal. No respiratory distress. He has no wheezes. He has no rales. He exhibits no tenderness.  Abdominal: Soft. Bowel sounds are normal. He exhibits no distension. There is no tenderness.  Musculoskeletal: Normal range of motion. He exhibits no edema and no tenderness.  Lymphadenopathy:    He has no cervical adenopathy.  Neurological: He is alert and oriented to person, place, and time. Coordination normal.  Skin: Skin is warm and dry. No rash noted. No erythema.  Psychiatric: He has a normal mood and affect. His behavior is normal. Judgment and thought content normal.      Assessment and Plan

## 2013-12-19 NOTE — Assessment & Plan Note (Signed)
Currently with no symptoms of angina. No further workup at this time. Continue current medication regimen. 

## 2013-12-19 NOTE — Patient Instructions (Addendum)
Please cut the coreg/cardvedilol in 1/2 twice a day  Please cut the losartan in 1/2 daily Please monitor your heart rate and blood pressure  Please take metolazone only as needed for weight >178 pounds or for leg swelling Take 30 minutes before the morning torsemide Please monitor your weight daily  Please call us if you have new issues that need to be addressed before your next appt.  Your physician wants you to follow-up in: 3 months.

## 2013-12-19 NOTE — Assessment & Plan Note (Signed)
We have recommended that he stay on his lovastatin

## 2013-12-19 NOTE — Assessment & Plan Note (Signed)
Heart rate is very slow today. Blood pressure also low. We have suggested he decrease the Coreg to 12.5 mg twice a day.

## 2013-12-19 NOTE — Assessment & Plan Note (Signed)
80s systolic on initial check, 100 systolic on my check. We would decrease the Coreg to 12.5 mg twice a day, decrease the losartan down to 50 mg daily. Suggested he monitor his blood pressure at home. He will also hold the metolazone, take the metolazone only for weight in the high 170 range. Weight today 172

## 2014-01-26 DIAGNOSIS — I2510 Atherosclerotic heart disease of native coronary artery without angina pectoris: Secondary | ICD-10-CM | POA: Insufficient documentation

## 2014-01-26 DIAGNOSIS — E119 Type 2 diabetes mellitus without complications: Secondary | ICD-10-CM | POA: Insufficient documentation

## 2014-03-21 ENCOUNTER — Telehealth: Payer: Self-pay | Admitting: *Deleted

## 2014-03-21 ENCOUNTER — Encounter: Payer: Self-pay | Admitting: Cardiovascular Disease

## 2014-03-21 ENCOUNTER — Ambulatory Visit (INDEPENDENT_AMBULATORY_CARE_PROVIDER_SITE_OTHER): Payer: Medicare Other | Admitting: Cardiovascular Disease

## 2014-03-21 VITALS — BP 122/75 | HR 58 | Ht 65.0 in | Wt 174.0 lb

## 2014-03-21 DIAGNOSIS — E785 Hyperlipidemia, unspecified: Secondary | ICD-10-CM

## 2014-03-21 DIAGNOSIS — I4891 Unspecified atrial fibrillation: Secondary | ICD-10-CM

## 2014-03-21 DIAGNOSIS — I509 Heart failure, unspecified: Secondary | ICD-10-CM

## 2014-03-21 DIAGNOSIS — I10 Essential (primary) hypertension: Secondary | ICD-10-CM

## 2014-03-21 DIAGNOSIS — I2510 Atherosclerotic heart disease of native coronary artery without angina pectoris: Secondary | ICD-10-CM

## 2014-03-21 DIAGNOSIS — E118 Type 2 diabetes mellitus with unspecified complications: Secondary | ICD-10-CM

## 2014-03-21 DIAGNOSIS — I4819 Other persistent atrial fibrillation: Secondary | ICD-10-CM

## 2014-03-21 DIAGNOSIS — E669 Obesity, unspecified: Secondary | ICD-10-CM

## 2014-03-21 DIAGNOSIS — K922 Gastrointestinal hemorrhage, unspecified: Secondary | ICD-10-CM

## 2014-03-21 DIAGNOSIS — I5032 Chronic diastolic (congestive) heart failure: Secondary | ICD-10-CM

## 2014-03-21 MED ORDER — APIXABAN 2.5 MG PO TABS: 2.5000 mg | ORAL_TABLET | Freq: Two times a day (BID) | ORAL | Status: DC

## 2014-03-21 NOTE — Assessment & Plan Note (Signed)
We have encouraged continued exercise, careful diet management in an effort to lose weight. 

## 2014-03-21 NOTE — Assessment & Plan Note (Signed)
Appears relatively euvolemic, if not mildly dehydrated given recent elevated creatinine and BUN. We have recommended he decrease the torsemide to 20 mg daily, back to 40 mg daily for weight of 178 pounds, metolazone only for weight of 180 pounds

## 2014-03-21 NOTE — Assessment & Plan Note (Signed)
Blood pressure is well controlled on today's visit. No changes made to the medications. 

## 2014-03-21 NOTE — Assessment & Plan Note (Signed)
Would encourage you his lovastatin. Goal LDL less than 70

## 2014-03-21 NOTE — Assessment & Plan Note (Addendum)
1 prior bleed in January 2015. No prior bleeds or bleed since that time. We will start low-dose anticoagulation for atrial fibrillation with close monitoring

## 2014-03-21 NOTE — Telephone Encounter (Signed)
Spoke w/ pt.  He is questioning the cost of xarelto, as the cash price is over $1000 for a month supply.  Eliquis is $152.72 for 30 day supply w/ his ins. Advised pt that Xarelto is on his ins plan, but I do not have access to the cost of his meds.  He states that the pharmacy could not run the ins cost for xarelto, as they had already entered his eliquis.  Advised pt that I can provide him w/ samples if we have them. He is appreciative and will call back if he would like to switch to xarelto.

## 2014-03-21 NOTE — Assessment & Plan Note (Signed)
We have encouraged continued exercise, careful diet management in an effort to lose weight. Hemoglobin A1c 7.5

## 2014-03-21 NOTE — Progress Notes (Signed)
Patient ID: [name], male    DOB: 1927-11-04, 80 y.o.   MRN: [id number]  HPI Comments: [name] is a 80 year old gentleman presents for routine followup of his atrial fibrillation. history of CAD, angioplasty in August 1996. PTCA of a diagonal branch and OM in 1996  hospitalization 07/07/2013 for diverticular bleed, atrial fibrillation. He was given antibiotics for diverticulitis. Seen by Dr. Mechele Collin. No colonoscopy was performed. He was not started on anticoagulation by cardiology given his GI bleeding and anemia.  In followup today, he reports that he is feeling well. On his prior clinic visit, medication changes were made including decreasing his carvedilol for bradycardia and hypotension. Since then he has felt better. No lightheadedness or dizziness. Good energy. He is playing golf. Does have calf pain after playing golf. Poorly controlled obstructive sleep apnea, not wearing his mask.  He's been taking torsemide 40 mg daily, metolazone as needed for weight of 178 pounds. Recent creatinine and BUN mildly elevated  He takes metolazone probably once every 10 days  Recent lab work August 2015 showing total cholesterol 129, LDL 74, hemoglobin A1c 7.5. No recent CBC. He reports hemoglobin 11   dizziness started while playing golf 05/16/2013. He felt he was having the flu. Seen by Dr. Dareen Piano and given prednisone with antibiotics. Uncertain if symptoms improved. Developed blood per rectum in January 2015 . He was anemic in the hospital, hemoglobin in the 9 range   echocardiogram 06/26/2013 showing ejection fraction 45-50%, right ventricular systolic pressure 68 mmHg. He was instructed to increase his torsemide to 40 mg alternating with 20 mg Repeat echocardiogram July 08 2013 showed right ventricular systolic pressure 49, normal ejection fraction   normal stress test March 2009, normal ejection fraction by echocardiogram March 2009 He had a PTCA of a diagonal branch and OM in  1996  EKG shows atrial fibrillation with ventricular rate 58 beats per minute, nonspecific ST abnormality.   Allergies; 2 Exforge in 2008 with tongue swelling, rash     Outpatient Encounter Prescriptions as of 03/21/2014  Medication Sig  . aspirin 325 MG tablet Take 325 mg by mouth daily.  . carvedilol (COREG) 25 MG tablet Take 12.5 mg by mouth 2 (two) times daily with a meal.   . Coenzyme Q10 (CO Q 10) 100 MG CAPS Take 200 mg by mouth daily.  Marland Kitchen dutasteride (AVODART) 0.5 MG capsule Take 0.5 mg by mouth 3 (three) times a week.   Marland Kitchen glipiZIDE (GLUCOTROL) 10 MG tablet Take 10 mg by mouth daily before breakfast.  . glucose blood test strip 1 each by Other route as needed for other. Use as instructed  . Insulin Detemir (LEVEMIR FLEXPEN) 100 UNIT/ML Pen Inject 12 Units into the skin daily at 10 pm.  . Insulin Pen Needle 31G X 6 MM MISC Use as directed  . IRON, FERROUS GLUCONATE, PO Take 150 mg by mouth 2 (two) times daily.  Marland Kitchen losartan (COZAAR) 100 MG tablet Take 50 mg by mouth daily.   Marland Kitchen lovastatin (MEVACOR) 10 MG tablet Take 10 mg by mouth at bedtime.  . Magnesium 200 MG TABS Take 200 mg by mouth daily.   . metolazone (ZAROXOLYN) 5 MG tablet Take 1 tablet (5 mg total) by mouth daily as needed.  . potassium chloride (K-DUR) 10 MEQ tablet Take 20 mEq by mouth daily.   . sitaGLIPtin-metformin (JANUMET) 50-1000 MG per tablet Take 1 tablet by mouth 2 (two) times daily with a meal.  . torsemide (DEMADEX) 20 MG  tablet Takes 40 mg every day.    Review of Systems  HENT: Negative.   Eyes: Negative.   Respiratory: Negative.   Cardiovascular: Negative.   Gastrointestinal: Negative.   Endocrine: Negative.   Musculoskeletal: Positive for myalgias.  Skin: Negative.   Allergic/Immunologic: Negative.   Neurological: Negative.   Hematological: Negative.   Psychiatric/Behavioral: Negative.   All other systems reviewed and are negative.   BP 122/75  Pulse 58  Ht 5\' 5"  (1.651 m)  Wt 174 lb  (78.926 kg)  BMI 28.96 kg/m2  Physical Exam  Nursing note and vitals reviewed. Constitutional: He is oriented to person, place, and time. He appears well-developed and well-nourished.  HENT:  Head: Normocephalic.  Nose: Nose normal.  Mouth/Throat: Oropharynx is clear and moist.  Eyes: Conjunctivae are normal. Pupils are equal, round, and reactive to light.  Neck: Normal range of motion. Neck supple. No JVD present.  Cardiovascular: S1 normal, S2 normal, normal heart sounds and intact distal pulses.  An irregularly irregular rhythm present. Bradycardia present.  Exam reveals no gallop and no friction rub.   No murmur heard. Pulmonary/Chest: Effort normal and breath sounds normal. No respiratory distress. He has no wheezes. He has no rales. He exhibits no tenderness.  Abdominal: Soft. Bowel sounds are normal. He exhibits no distension. There is no tenderness.  Musculoskeletal: Normal range of motion. He exhibits no edema and no tenderness.  Lymphadenopathy:    He has no cervical adenopathy.  Neurological: He is alert and oriented to person, place, and time. Coordination normal.  Skin: Skin is warm and dry. No rash noted. No erythema.  Psychiatric: He has a normal mood and affect. His behavior is normal. Judgment and thought content normal.      Assessment and Plan

## 2014-03-21 NOTE — Assessment & Plan Note (Signed)
Currently with no symptoms of angina. No further workup at this time. Continue current medication regimen. 

## 2014-03-21 NOTE — Patient Instructions (Signed)
You are doing well. Please decrease the torsemide down to once a day Only take metolazone for weight greater than 180  Please start eliquis 2.5 mg twice a twice a day  Please call us if you have new issues that need to be addressed before your next appt.  Your physician wants you to follow-up in: 3 months.  You will receive a reminder letter in the mail two months in advance. If you don't receive a letter, please call our office to schedule the follow-up appointment.

## 2014-03-21 NOTE — Assessment & Plan Note (Signed)
Heart rate well controlled, continues to run in atrial fibrillation. He reports hemoglobin has improved to greater than 11. Long discussion today about anticoagulation with him. He is reluctant but willing to start eliquis at a low dose. We'll start 2.5 mg twice a day to start. Low dose given his prior GI bleeding history . 30 day coupon given today

## 2014-03-21 NOTE — Telephone Encounter (Signed)
Please call the patient with the strength of the [facility]?

## 2014-06-07 DIAGNOSIS — I480 Paroxysmal atrial fibrillation: Secondary | ICD-10-CM | POA: Insufficient documentation

## 2014-06-17 ENCOUNTER — Ambulatory Visit (INDEPENDENT_AMBULATORY_CARE_PROVIDER_SITE_OTHER): Payer: Medicare Other | Admitting: Cardiovascular Disease

## 2014-06-17 ENCOUNTER — Telehealth: Payer: Self-pay | Admitting: Cardiovascular Disease

## 2014-06-17 ENCOUNTER — Encounter: Payer: Self-pay | Admitting: Cardiovascular Disease

## 2014-06-17 VITALS — BP 138/64 | HR 52 | Ht 65.0 in | Wt 181.2 lb

## 2014-06-17 DIAGNOSIS — I4891 Unspecified atrial fibrillation: Secondary | ICD-10-CM

## 2014-06-17 DIAGNOSIS — E669 Obesity, unspecified: Secondary | ICD-10-CM

## 2014-06-17 DIAGNOSIS — E118 Type 2 diabetes mellitus with unspecified complications: Secondary | ICD-10-CM

## 2014-06-17 DIAGNOSIS — I2510 Atherosclerotic heart disease of native coronary artery without angina pectoris: Secondary | ICD-10-CM

## 2014-06-17 DIAGNOSIS — I10 Essential (primary) hypertension: Secondary | ICD-10-CM

## 2014-06-17 DIAGNOSIS — E785 Hyperlipidemia, unspecified: Secondary | ICD-10-CM

## 2014-06-17 DIAGNOSIS — R001 Bradycardia, unspecified: Secondary | ICD-10-CM

## 2014-06-17 MED ORDER — POTASSIUM CHLORIDE ER 20 MEQ PO TBCR: 20.0000 meq | EXTENDED_RELEASE_TABLET | Freq: Every day | ORAL | Status: DC

## 2014-06-17 MED ORDER — APIXABAN 2.5 MG PO TABS: 2.5000 mg | ORAL_TABLET | Freq: Two times a day (BID) | ORAL | Status: DC

## 2014-06-17 MED ORDER — CARVEDILOL 6.25 MG PO TABS: 6.2500 mg | ORAL_TABLET | Freq: Two times a day (BID) | ORAL | Status: DC

## 2014-06-17 NOTE — Telephone Encounter (Signed)
Pt called to let us, Eliquis was not on the "updated" med list   Eliquis. Needs to be on the list of meds.

## 2014-06-17 NOTE — Assessment & Plan Note (Signed)
Decreased coreg dow down to 6.25 mg po BID

## 2014-06-17 NOTE — Assessment & Plan Note (Signed)
Recommended strict diet changes, weight loss in an effort to get hemoglobin A1c in the 6 range

## 2014-06-17 NOTE — Assessment & Plan Note (Signed)
We have recommended that he decrease the carvedilol down to 6.25 mill grams twice a day. He has fatigue, heart rate low 50s

## 2014-06-17 NOTE — Assessment & Plan Note (Signed)
Currently with no symptoms of angina. No further workup at this time. Continue current medication regimen. 

## 2014-06-17 NOTE — Assessment & Plan Note (Signed)
We have encouraged continued exercise, careful diet management in an effort to lose weight. 

## 2014-06-17 NOTE — Assessment & Plan Note (Addendum)
I'm concerned his leg cramping could be from the lovastatin. We'll hold the lovastatin for now and suggested he call us back if symptoms of calf cramping improves He had similar symptoms with Lipitor

## 2014-06-17 NOTE — Patient Instructions (Addendum)
You are doing well.  For slow heart rate,  Decrease the coreg down to 6.25 mg twice a day  Please hold the lovastatin for a few weeks to see if leg cramps get better  Mon, wed,fri take an extra potassium  Please call us if you have new issues that need to be addressed before your next appt.  Your physician wants you to follow-up in: 6 months.  You will receive a reminder letter in the mail two months in advance. If you don't receive a letter, please call our office to schedule the follow-up appointment.

## 2014-06-17 NOTE — Progress Notes (Signed)
Patient ID: Yonatan Zottola, male    DOB: [birth year], 80 y.o.   MRN: [id number]  HPI Comments: Mr Daws is a 80 year old gentleman presents for routine followup of his atrial fibrillation. history of CAD, angioplasty in August 1996. PTCA of a diagonal branch and OM in 1996 He has obstructive sleep apnea, not on CPAP  hospitalization 07/07/2013 for diverticular bleed, atrial fibrillation. He was given antibiotics for diverticulitis. Seen by Dr. Mechele Collin. No colonoscopy was performed. He was not started on anticoagulation by cardiology given his GI bleeding and anemia.  In followup today, he reports that he is feeling well. He reports that he is having some leg cramping. Previously had problems on Lipitor States that his weight has been stable. Typically takes torsemide daily, metolazone for weight of 181 pounds He takes potassium daily Recent blood showing creatinine 1.6, BUN 29, hemoglobin A1c 7.6 Total cholesterol 120, LDL 64, this was reviewed with him Carvedilol previously decreased for bradycardia. He takes Coreg 12.5 mill grams twice a day He does report significant fatigue in the daytime, sleeps whenever he sits down  EKG on today's visit shows sinus bradycardia with rate 52 bpm, no significant ST or T-wave changes   lab work August 2015 showing total cholesterol 129, LDL 74, hemoglobin A1c 7.5. No recent CBC. He reports hemoglobin 11   dizziness started while playing golf 05/16/2013. He felt he was having the flu. Seen by Dr. Dareen Piano and given prednisone with antibiotics. Uncertain if symptoms improved. Developed blood per rectum in January 2015 . He was anemic in the hospital, hemoglobin in the 9 range   echocardiogram 06/26/2013 showing ejection fraction 45-50%, right ventricular systolic pressure 68 mmHg. He was instructed to increase his torsemide to 40 mg alternating with 20 mg Repeat echocardiogram July 08 2013 showed right ventricular systolic pressure 49, normal ejection  fraction   normal stress test March 2009, normal ejection fraction by echocardiogram March 2009 He had a PTCA of a diagonal branch and OM in 1996  EKG shows atrial fibrillation with ventricular rate 58 beats per minute, nonspecific ST abnormality.   Allergies; 2 Exforge in 2008 with tongue swelling, rash     Allergies  Allergen Reactions  . Exforge [Amlodipine Besylate-Valsartan]   . Lisinopril   . Niacin And Related     Outpatient Encounter Prescriptions as of 06/17/2014  Medication Sig  . apixaban (ELIQUIS) 2.5 MG TABS tablet Take 1 tablet (2.5 mg total) by mouth 2 (two) times daily.  Marland Kitchen aspirin 325 MG tablet Take 325 mg by mouth daily.  . carvedilol (COREG) 6.25 MG tablet Take 1 tablet (6.25 mg total) by mouth 2 (two) times daily with a meal.  . Coenzyme Q10 (CO Q 10) 100 MG CAPS Take 200 mg by mouth daily.  Marland Kitchen dutasteride (AVODART) 0.5 MG capsule Take 0.5 mg by mouth 3 (three) times a week.   Marland Kitchen glipiZIDE (GLUCOTROL) 10 MG tablet Take 10 mg by mouth daily before breakfast.  . glucose blood test strip 1 each by Other route as needed for other. Use as instructed  . Insulin Detemir (LEVEMIR FLEXPEN) 100 UNIT/ML Pen Inject 12 Units into the skin daily at 10 pm.  . Insulin Pen Needle 31G X 6 MM MISC Use as directed  . IRON, FERROUS GLUCONATE, PO Take 150 mg by mouth 2 (two) times daily.  Marland Kitchen losartan (COZAAR) 100 MG tablet Take 50 mg by mouth daily.   Marland Kitchen lovastatin (MEVACOR) 10 MG tablet Take 10 mg by mouth at  bedtime.  . Magnesium 200 MG TABS Take 200 mg by mouth daily.   . metolazone (ZAROXOLYN) 5 MG tablet Take 1 tablet (5 mg total) by mouth daily as needed.  . sitaGLIPtin-metformin (JANUMET) 50-1000 MG per tablet Take 1 tablet by mouth 2 (two) times daily with a meal.  . torsemide (DEMADEX) 20 MG tablet Take 20 mg by mouth daily.   . [DISCONTINUED] apixaban (ELIQUIS) 2.5 MG TABS tablet Take 1 tablet (2.5 mg total) by mouth 2 (two) times daily.  . [DISCONTINUED] carvedilol (COREG)  25 MG tablet Take 12.5 mg by mouth 2 (two) times daily with a meal.   . [DISCONTINUED] potassium chloride (K-DUR) 10 MEQ tablet Take 20 mEq by mouth daily.   . potassium chloride 20 MEQ TBCR Take 20 mEq by mouth daily.    Past Medical History  Diagnosis Date  . Pure hypercholesterolemia   . Type II or unspecified type diabetes mellitus without mention of complication, uncontrolled   . Hypersomnia with sleep apnea, unspecified   . Other and unspecified hyperlipidemia   . Essential hypertension, benign   . Coronary artery disease   . BPH (benign prostatic hypertrophy)   . Diverticulosis   . MI (myocardial infarction)     Past Surgical History  Procedure Laterality Date  . Tonsillectomy    . Inguinal hernia repair      left inguinal   . Cholecystectomy  1994  . Flexible sigmoidoscopy    . Cardiac catheterization    . [place]    Social History  reports that he has quit smoking. His smoking use included Cigarettes. He smoked 0.00 packs per day for 70 years. He does not have any smokeless tobacco history on file. He reports that he does not drink alcohol or use illicit drugs.  Family History Family history is unknown by patient.     Review of Systems  Constitutional: Positive for fatigue.  Eyes: Negative.   Respiratory: Negative.   Cardiovascular: Negative.   Gastrointestinal: Negative.   Musculoskeletal: Positive for myalgias.  Neurological: Negative.   Hematological: Negative.   Psychiatric/Behavioral: Negative.   All other systems reviewed and are negative.   BP 138/64 mmHg  Pulse 52  Ht 5\' 5"  (1.651 m)  Wt 181 lb 4 oz (82.214 kg)  BMI 30.16 kg/m2  Physical Exam  Constitutional: He is oriented to person, place, and time. He appears well-developed and well-nourished.  HENT:  Head: Normocephalic.  Nose: Nose normal.  Mouth/Throat: Oropharynx is clear and moist.  Eyes: Conjunctivae are normal. Pupils are equal, round, and  reactive to light.  Neck: Normal range of motion. Neck supple. No JVD present.  Cardiovascular: Normal rate, regular rhythm, S1 normal, S2 normal, normal heart sounds and intact distal pulses.  Exam reveals no gallop and no friction rub.   No murmur heard. Pulmonary/Chest: Effort normal and breath sounds normal. No respiratory distress. He has no wheezes. He has no rales. He exhibits no tenderness.  Abdominal: Soft. Bowel sounds are normal. He exhibits no distension. There is no tenderness.  Musculoskeletal: Normal range of motion. He exhibits no edema or tenderness.  Lymphadenopathy:    He has no cervical adenopathy.  Neurological: He is alert and oriented to person, place, and time. Coordination normal.  Skin: Skin is warm and dry. No rash noted. No erythema.  Psychiatric: He has a normal mood and affect. His behavior is normal. Judgment and thought content normal.  Assessment and Plan   Nursing note and vitals reviewed.

## 2014-06-19 NOTE — Telephone Encounter (Signed)
Left message on pt's vm that Eliquis 2.5 mg is on his med list and must have been added after his AVS was printed. Asked him to call back w/ any questions or concerns.

## 2014-06-19 NOTE — Telephone Encounter (Signed)
Pt called, states he is waiting for a call back from yesterday regarding his Eliquis

## 2014-06-20 ENCOUNTER — Ambulatory Visit: Payer: Medicare Other | Admitting: Cardiovascular Disease

## 2014-09-30 DIAGNOSIS — R310 Gross hematuria: Secondary | ICD-10-CM | POA: Insufficient documentation

## 2014-10-15 NOTE — Op Note (Signed)
PATIENT NAME:  [name], [name] MR#:  [id number] DATE OF BIRTH:  [birth year]  DATE OF PROCEDURE:  04/25/2012  PREOPERATIVE DIAGNOSIS: Visually significant cataract of the left eye.   POSTOPERATIVE DIAGNOSIS: Visually significant cataract of the left eye.   OPERATIVE PROCEDURE: Cataract extraction by phacoemulsification with implant of intraocular lens to left eye.   SURGEON: Galen Manila, MD.   ANESTHESIA:  1. Managed anesthesia care.  2. Topical tetracaine drops followed by 2% Xylocaine jelly applied in the preoperative holding area.   COMPLICATIONS: None.   TECHNIQUE:  Stop and chop.   DESCRIPTION OF PROCEDURE: The patient was examined and consented in the preoperative holding area where the aforementioned topical anesthesia was applied to the left eye and then brought back to the Operating Room where the left eye was prepped and draped in the usual sterile ophthalmic fashion and a lid speculum was placed. A paracentesis was created with the side port blade and the anterior chamber was filled with viscoelastic. A near clear corneal incision was performed with the steel keratome. A continuous curvilinear capsulorrhexis was performed with a cystotome followed by the capsulorrhexis forceps. Hydrodissection and hydrodelineation were carried out with BSS on a blunt cannula. The lens was removed in a stop and chop technique and the remaining cortical material was removed with the irrigation-aspiration handpiece. The capsular bag was inflated with viscoelastic and the Tecnis ZCB00 22.5-diopter lens, serial number [id number] was placed in the capsular bag without complication. The remaining viscoelastic was removed from the eye with the irrigation-aspiration handpiece. The wounds were hydrated. The anterior chamber was flushed with Miostat and the eye was inflated to physiologic pressure. The wounds were found to be water tight. The eye was dressed with Vigamox. The patient was given protective  glasses to wear throughout the day and a shield with which to sleep tonight. The patient was also given drops with which to begin a drop regimen today and will follow-up with me in one day.  ____________________________ Jerilee Field. Dewie Ahart, MD wlp:slb D: 04/25/2012 15:29:15 ET T: 04/25/2012 15:52:33 ET JOB#: [id number]  cc: Caine Barfield L. Caitlen Worth, MD, <Dictator> Jerilee Field Jeena Arnett MD ELECTRONICALLY SIGNED 05/04/2012 10:23

## 2014-10-15 NOTE — Op Note (Signed)
PATIENT NAME:  [name], [name] MR#:  [id number] DATE OF BIRTH:  [birth year]  DATE OF PROCEDURE:  05/23/2012  PREOPERATIVE DIAGNOSIS: Visually significant cataract of the right eye.   POSTOPERATIVE DIAGNOSIS: Visually significant cataract of the right eye.   OPERATIVE PROCEDURE: Cataract extraction by phacoemulsification with implant of intraocular lens to right eye.   SURGEON: Galen Manila, MD.   ANESTHESIA:  1. Managed anesthesia care.  2. Topical tetracaine drops followed by 2% Xylocaine jelly applied in the preoperative holding area.   COMPLICATIONS: None.   TECHNIQUE:  Stop and chop.  DESCRIPTION OF PROCEDURE: The patient was examined and consented in the preoperative holding area where the aforementioned topical anesthesia was applied to the right eye and then brought back to the Operating Room where the right eye was prepped and draped in the usual sterile ophthalmic fashion and a lid speculum was placed. A paracentesis was created with the side port blade and the anterior chamber was filled with viscoelastic. A near clear corneal incision was performed with the steel keratome. A continuous curvilinear capsulorrhexis was performed with a cystotome followed by the capsulorrhexis forceps. Hydrodissection and hydrodelineation were carried out with BSS on a blunt cannula. The lens was removed in a stop and chop technique and the remaining cortical material was removed with the irrigation-aspiration handpiece. The capsular bag was inflated with viscoelastic and the Tecnis ZCB00 23.5-diopter lens, serial number [id number] was placed in the capsular bag without complication. The remaining viscoelastic was removed from the eye with the irrigation-aspiration handpiece. The wounds were hydrated. The anterior chamber was flushed with Miostat and the eye was inflated to physiologic pressure. The wounds were found to be water tight. The eye was dressed with Vigamox. The patient was given protective  glasses to wear throughout the day and a shield with which to sleep tonight. The patient was also given drops with which to begin a drop regimen today and will follow-up with me in one day.  ____________________________ Jerilee Field. Jabree Pernice, MD wlp:slb D: 05/23/2012 16:31:31 ET T: 05/23/2012 16:47:16 ET JOB#: [id number]  cc: Serapio Edelson L. Haillee Johann, MD, <Dictator> Jerilee Field Yudit Modesitt MD ELECTRONICALLY SIGNED 06/01/2012 15:44

## 2014-10-19 NOTE — Consult Note (Signed)
Brief Consult Note: Diagnosis: atrial fibrillation, new onset.   Patient was seen by consultant.   Consult note dictated.   Comments: Patient seen and examined. Full dictated note to follow. The patient has been admitted with a GI bleed due to diverticular disease. His bleeding appears have stopped. He has been found to have atrial fibrillation. He is not a candidate for systemic anti-coagulation, at least not presently. I have discussed the treatment options. He is minimally sympomatic. Would recommend a strategy of rate control with beta blockers. He will need a 2D echo. This could be scheduled as an outpatient. Lewayne Bunting, M.D.  Electronic Signatures: Lewayne Bunting (MD)  (Signed 11-Jan-15 12:35)  Authored: Brief Consult Note   Last Updated: 11-Jan-15 12:35 by Lewayne Bunting (MD)

## 2014-10-19 NOTE — Consult Note (Signed)
PATIENT NAME:  [name], [name] MR#:  [id number] DATE OF BIRTH:  April 15, 1928  DATE OF CONSULTATION:  07/08/2013  REFERRING PHYSICIAN:  Dr. Thedore Mins CONSULTING PHYSICIAN:  Doylene Canning. Ladona Ridgel, MD  INDICATION FOR CONSULTATION:  Evaluation of atrial fibrillation in the setting of GI bleeding.    HISTORY OF PRESENT ILLNESS:  The patient is an 81 year old male, retired, moved approximately 5 years ago from [place] to [place] to be closer to family.  He has a history of diabetes and coronary disease and was in his usual state of health when he developed bleeding with bright red blood per rectum.  He was admitted to the hospital and after approximately 12 hours his bleeding stopped.  The patient did get lightheaded and dizzy when he was going to the bathroom, and felt like he might pass out though he did not.  He does not have palpitations.  He was subsequently found to have atrial fibrillation with a controlled/rapid ventricular response and was referred for additional evaluation.  He denies any prior history of atrial fibrillation and this is a new diagnosis to him.  He has not had syncope.  He denies chest pain or shortness of breath at baseline.  He normally does not have nausea or vomiting and his health has otherwise been fairly good.    PAST MEDICAL HISTORY:  Notable for hypertension, also notable for diabetes and coronary disease.  He had angioplasty in the 1990's.    PAST SURGICAL HISTORY:  Notable for cholecystectomy, a cystoscopy and remote hernia repair.    ALLERGIES:  HE HAS A HISTORY OF ALLERGIES TO EXFORGE, LISINOPRIL AND NIASPAN.  SOCIAL HISTORY:  The patient has a history of tobacco abuse but stopped smoking many, many years ago.  He denies alcohol or drug use.    FAMILY HISTORY:  He has a family history of hypertension, otherwise negative.   REVIEW OF SYSTEMS:  Negative except as noted in the HPI.  He did not have any stroke-like symptoms.    PHYSICAL EXAMINATION: GENERAL:  He is a  pleasant, elderly-appearing man, well appearing, in no acute distress.   VITAL SIGNS:  Blood pressure was 150/76, his pulse was 80 and irregular, the respirations were 18, temperature was 98. HEENT:  Normocephalic, atraumatic.  Pupils were equal and round.  Oropharynx was moist.  His sclerae were anicteric.   NECK:  Revealed 7 cm jugular venous distension.  There was no thyromegaly.  Trachea was midline.  The carotids were 2+ and symmetric.   LUNGS:  Revealed rales in the bases bilaterally but no increased work of breathing, no rhonchi and no wheezes.   CARDIOVASCULAR:  Distant with an irregularly irregular rhythm but normal S1 and S2.  The PMI was not enlarged, nor was it laterally displaced.   ABDOMEN:  Obese, nontender, nondistended.  There was no organomegaly.   EXTREMITIES:  Demonstrated trace peripheral edema bilaterally.  There was no cyanosis or clubbing.    LABORATORY, DIAGNOSTIC AND RADIOLOGICAL DATA:  EKG demonstrates atrial fibrillation with a controlled ventricular response.  Chest x-ray was reviewed.  Labs were reviewed.    IMPRESSION: 1.  New-onset atrial fibrillation. 2.  Gastrointestinal bleeding.   3.  Diabetes. 4.  Obesity.  DISCUSSION:  The patient is not a candidate for anticoagulation at the present time secondary to his acute GI bleed.  He may well be in the future but not right now.  Would continue the strategy of rate control with low-dose beta blocker therapy, either 25  to 50 mg of Toprol a day or 6 to 12 mg twice a day of carvedilol.  Would recommend 2-D echo.  He will need followup with my partners in the [place] office, Dr. Mariah Milling and/or Dr. Kirke Corin.  From a cardiovascular perspective, he could be discharged sooner rather than later as his GI bleeding appears to have stopped and he will not need any systemic anticoagulation with his history of GI bleeding.    ____________________________ Doylene Canning. Ladona Ridgel, MD gwt:cs D: 07/08/2013 12:41:00 ET T: 07/08/2013 15:01:04  ET JOB#: [id number]  cc: Doylene Canning. Ladona Ridgel, MD, <Dictator> Dr. Lewayne Bunting, M.D. ELECTRONICALLY SIGNED 08/14/2013 15:45

## 2014-10-19 NOTE — Consult Note (Signed)
Pt with LGI bleed likely from diverticulosis.  He has stopped bleeding, his GI bleeding scan was neg.  He has palpable tenderness in LLQ so I would go ahead and cover him with antibiotics, Zosyn and change to Augmentin when discharged.  Clear liq today and tomorrow and full liquids after that followed by mechanical soft diet.  Will follow with you.  If has obvious rebleed need repeat CT scan since he had diveticuli in transverse, descending and sigmoid colon on colonoscopy 3 years ago.  Electronic Signatures: Scot Jun (MD)  (Signed on 10-Jan-15 11:31)  Authored  Last Updated: 10-Jan-15 11:31 by Scot Jun (MD)

## 2014-10-19 NOTE — Consult Note (Signed)
Pt without bleeding.  VSS, on 3L oxygen.  New onset at fib.  hgb 8.8.  Met B OK.    He has insp rales in right base posterior.  From GI standpoint he can go home today, await cardiac work up.  Due to rales and hypoxemia suggest CXR, I cannot find results on one.  Electronic Signatures: Manya Silvas (MD)  (Signed on 11-Jan-15 10:16)  Authored  Last Updated: 11-Jan-15 10:16 by Manya Silvas (MD)

## 2014-10-19 NOTE — Discharge Summary (Signed)
PATIENT NAME:  [name], [name] MR#:  [id number] DATE OF BIRTH:  1927-11-12  DATE OF ADMISSION:  07/06/2013 DATE OF DISCHARGE:  07/09/2013  DISCHARGE DIAGNOSES:  1.  Anemia, acute from lower gastrointestinal bleed.  2.  Diverticulitis.  3.  History of cardiomyopathy with edema.  4.  Atrial fibrillation, not anticoagulated secondary to the above, and just 1 episode with acute illness.   DISCHARGE MEDICATIONS: Per [facility] med reconciliation system. Please see for details. Briefly, on Augmentin for a week. We are holding his doxazosin and losartan until seen in the office but going back to his full dose of carvedilol 25 b.i.d. now.   HISTORY AND PHYSICAL: Please see detailed history and physical done on admission and documented in the chart.   HOSPITAL COURSE: The patient was admitted by the hospitalist, seen by partner, Dr. Thedore Mins, throughout the hospitalization until today's date. He had a lower GI bleed thought likely diverticular. Cardiology saw him for the brief Afib. Given his rate was controlled, he did not want anticoagulation as noted. He was seen by GI, Dr. Mechele Collin, as well. They noted a negative bleeding scan and likely diverticulitis with some left lower quadrant tenderness. He had some mild hypoxemia, history of edema noted, which has actually markedly improved with recent diuresis as an outpatient. He will follow up in the office soon.   TIME SPENT: It took approximately 34 minutes to do all discharge tasks today;  evaluation, med reconciliation, discussion with the patient, review of chart, etc.   ____________________________ Marya Amsler. Dareen Piano, MD mwa:np D: 07/09/2013 09:46:33 ET T: 07/09/2013 15:03:08 ET JOB#: [id number]  cc: Marya Amsler. Dareen Piano, MD, <Dictator> Lauro Regulus MD ELECTRONICALLY SIGNED 07/10/2013 7:30

## 2014-10-19 NOTE — Consult Note (Signed)
PATIENT NAME:  [name], [name] MR#:  [id number] DATE OF BIRTH:  06/13/1928  DATE OF CONSULTATION:  07/07/2013  CONSULTING PHYSICIAN:  Charles Jun, MD  HISTORY OF PRESENT ILLNESS: The patient is an 81 year old white male with a history of rectal bleeding. He had about 10 bowel movements with passage of blood, and last time he sat on the commode, he felt like he was going to have a syncopal episode and reported dizziness in the ER when he was evaluated. I was asked to see him for lower GI bleeding.   The patient had a colonoscopy in 2011 with Dr. Bluford Frazier that showed diverticulosis of the sigmoid, descending and transverse colons. The patient had a bleeding scan this hospitalization after admission, and by that time, apparently the bleeding had stopped because the scan was negative, and since then, he has had no further episodes of bleeding. Because of dizziness on presentation and near-syncope and continued bleeding while in the ER, it was felt necessary to transfuse him with a unit of blood, which was done, which turns out to be very appropriate use since his hemoglobin, despite the unit of blood, fell 2 points since admission.   PAST MEDICAL HISTORY: Diabetes, coronary artery disease with angioplasty, diverticulosis.   PAST SURGICAL HISTORY: Gallbladder removal, cystoscopy, hernia repair.   SOCIAL HISTORY: The patient was an Print production planner. Lived in [place] most of his life. Moved down to this area 5 years ago.   ALLERGIES: EXFORGE, LISINOPRIL AND NIASPAN.   MEDICATIONS: He takes:  1. Torsemide 20 mg once a day.  2. Losartan 100 mg once a day. 3. Levemir 12 units per day. 4. Janumet 1000/50 one twice a day.  5. Hydrochlorothiazide 25 mg a day.  6. Glipizide 10 mg a day.  7. Ecotrin 325 mg once a day.  8. Doxazosin 4 mg a day.  9. Carvedilol 25 mg twice a day. 10. Avodart 0.5 mg 3 to 4 times a day.   REVIEW OF SYSTEMS: He denies any nausea, vomiting, fever, chills. No shortness  of breath. No dysuria. No urinary bleeding. He did have some illness for a few weeks in November with some shortness of breath and dyspnea on exertion after a flulike illness, which went away after a few weeks. He does complain of twinges of pain in the left lower quadrant, however.   PHYSICAL EXAMINATION:  GENERAL: An elderly white male in no acute distress, wearing oxygen.  VITAL SIGNS: Temperature 98.8, pulse 64, blood pressure 144/63, 93% on room air, 100% on 3 liters.  HEENT: Sclerae nonicteric. Conjunctivae negative. Tongue negative. The head is atraumatic.  CHEST: Clear anterior fields.  HEART: Shows no murmurs or gallops that I can hear.  ABDOMEN: Bowel sounds are present. No hepatosplenomegaly. No masses. No bruits. There is tenderness in the left lower quadrant.  EXTREMITIES: No edema.  SKIN: Warm and dry.  PSYCHIATRIC: Mood and affect are appropriate.   LABORATORY DATA: Glucose 148, BUN 20, creatinine 1.23, sodium 140, potassium 3.9, chloride 107, CO2 29, calcium 8.1, total protein 7.7, albumin 3.2, total bilirubin 1.0, alkaline phosphatase 85, SGOT 34, SGPT 22. White blood count 10.9, hemoglobin 9.3, hemoglobin repeat 6 hours later is 8.9. Bleeding scan showed no active bleeding. He has A positive blood with a negative antibody screen.   ASSESSMENT AND PLAN: Lower gastrointestinal bleeding, very likely diverticulosis and bleeding from that. It is unusual to see abdominal discomfort in patients with diverticulosis bleeding, so there may be a touch of diverticulitis  here also. Because of his tenderness and his bleeding, I am going to go ahead and cover him with Zosyn and recommend a course of Augmentin as an outpatient. No need for colonoscopy at this time. If he rebleeds, will repeat the bleeding scan and go from there.   ____________________________ Charles Jun, MD rte:lb D: 07/07/2013 11:47:55 ET T: 07/07/2013 11:59:54 ET JOB#: [id number]  cc: Charles Jun, MD,  <Dictator> [name]. Charles Kaufmann, MD [name]. Charles Piano, MD  Charles Jun MD ELECTRONICALLY SIGNED 07/12/2013 17:38

## 2014-10-19 NOTE — H&P (Signed)
PATIENT NAME:  [name], SWINGER MR#:  [id number] DATE OF BIRTH:  1928-01-09  DATE OF ADMISSION:  07/06/2013  PRIMARY CARE PROVIDER:  Dr. Dareen Piano  EMERGENCY REFERRING PHYSICIAN:  Dr. Carollee Massed  CHIEF COMPLAINT: Bright red blood per rectum.   HISTORY OF PRESENT ILLNESS: The patient is an 81 year old white male with history of diabetes, coronary artery disease who states that he was recently getting over pneumonia and was doing better over the past few days. And then last night he started having some bright red blood per rectum. He thought it would go away. His second bowel movement was clear and then he went out. And then subsequently started having further bowel movements with bright red color. The patient at least had 10 bowel movements of this type. He came to the ED. The last bowel movement he had, he was sitting on the commode when he felt like he was going to pass out. The ED physician has asked to get a bleeding scan. The results, currently we are waiting for the tech to come in. The patient reports that he is not feeling short of breath, is not having any chest pain but is feeling a little dizzy. The patient reports no previous history of having bleeding. He did have a colonoscopy a few years ago that did show diverticula. He otherwise denies any abdominal pain, nausea, vomiting or any hematemesis.   PAST MEDICAL HISTORY: Significant for:  1.  Diabetes.  2.  Coronary artery disease with balloon angioplasty in the 1990s. 3.  History of diverticulosis. 4.  History of colon.   PAST SURGICAL HISTORY: Status post cholecystectomy, history of cystoscopy, history of hernia repair.   ALLERGIES: EXFORGE, LISINOPRIL, NIASPAN.  MEDICATIONS:  List is currently pending. His wife is to bring that.   SOCIAL HISTORY: History of smoking, quit a long time ago. No alcohol or drug use.   FAMILY HISTORY: Positive for hypertension.  REVIEW OF SYSTEMS:  CONSTITUTIONAL: Denies any fevers, fatigueness,  weakness. No pain. No weight loss. No weight gain.  EYES: No blurred or double vision. No pain. No redness. No inflammation. No glaucoma. No cataract.  ENT: No tinnitus. No ear pain. No hearing loss. No seasonal or year-round allergies. No difficulty swallowing.  RESPIRATORY: Denies any cough, wheezing, hemoptysis. No COPD.  CARDIOVASCULAR: Denies any chest pain, orthopnea. Has chronic edema of the lower extremity. No palpitation.  GASTROINTESTINAL: No nausea, vomiting, diarrhea. No abdominal pain. Complains of bright red blood per rectum. Denies any constipation.  GENITOURINARY: Denies any dysuria, hematuria, renal calc or frequency.  ENDOCRINE: Denies any polyuria, nocturia or thyroid problems. HEMATOLOGIC AND LYMPHATIC:  Denies anemias, easy bruisability or bleeding.  SKIN: No acne. No rash. No changes in mole, hair or skin.  MUSCULOSKELETAL: Denies any pain in the neck, back or shoulder.  NEUROLOGIC: No numbness. No CVA. No TIA. No seizures.  PSYCHIATRIC: No anxiety. No insomnia. No ADD.   PHYSICAL EXAMINATION: VITAL SIGNS: Temperature 97.8, pulse 74, respirations 18, blood pressure 173/66, O2 100%.  GENERAL: The patient is an elderly white male, obese, currently not in any acute distress.  HEENT: Head atraumatic, normocephalic. Pupils equally round, reactive to light and accommodation. There is no conjunctival pallor. No scleral icterus. Nasal exam shows no drainage or ulceration.  Oropharynx is clear without any exudate.  NECK: Supple without any JVD.  CARDIOVASCULAR: Regular rate and rhythm. No murmurs, rubs, clicks or gallops. PMI is not displaced.  LUNGS: Clear to auscultation bilaterally without any rales, rhonchi, wheezing.  ABDOMEN: Soft, nontender, nondistended. Positive bowel sounds x 4.  EXTREMITIES: No clubbing, cyanosis or edema.  SKIN: No rash.  LYMPHATICS: No lymph nodes palpable.  VASCULAR: Good DP, PT pulses.  PSYCHIATRIC: Not anxious or depressed.  NEUROLOGIC: Awake,  alert, oriented x 3. No focal deficits.  Glucose 182, BUN 17, creatinine 1.30. Sodium 136, potassium 4.2, chloride 103. CO2 is 29, calcium 8.8. LFTs are normal except albumin at 3.2. WBC 7.4, hemoglobin 11.9, platelet count 198.   ASSESSMENT AND PLAN: The patient is an 81 year old white male with history of diverticulosis, diabetes, hypertension, presents with bright red blood per rectum, has had 10 bowel movements, had a near syncopal episode.  1.  Acute lower gastrointestinal bleed, severe with symptomatic. The patient's bleeding scan is currently pending. He is very symptomatic. We will transfuse, likely due to diverticular bleed. A bleeding scan is currently pending. To localize the bleeding, if possible. The patient is very symptomatic and continues to have bleeding. At this point, we will go ahead and transfuse him 2 packed red blood cells. Follow CBC.  I have discussed the case with Dr. Mechele Collin, who agrees with the plan. He agrees with the transfusion. 2.  Coronary artery disease, hold aspirin.  3.  Diabetes. We will place him on sliding scale.  4. Miscellaneous. We will use sequential compression devices for deep vein thrombosis prophylaxis in light of gastrointestinal bleeding.  TIME SPENT:  On this patient is 50 minutes.  NOTE:  The patient's care will be transferred to Dr. Dareen Piano or [facility].   ____________________________ Lacie Scotts. Allena Katz, MD shp:ce D: 07/06/2013 19:18:21 ET T: 07/06/2013 19:50:20 ET JOB#: [id number]  cc: Addis Tuohy H. Allena Katz, MD, <Dictator> Charise Carwin MD ELECTRONICALLY SIGNED 07/20/2013 17:08

## 2014-10-24 DIAGNOSIS — I739 Peripheral vascular disease, unspecified: Secondary | ICD-10-CM | POA: Insufficient documentation

## 2014-12-18 ENCOUNTER — Encounter: Payer: Self-pay | Admitting: Cardiovascular Disease

## 2014-12-18 ENCOUNTER — Ambulatory Visit (INDEPENDENT_AMBULATORY_CARE_PROVIDER_SITE_OTHER): Payer: Medicare Other | Admitting: Cardiovascular Disease

## 2014-12-18 VITALS — BP 140/70 | HR 53 | Ht 65.0 in | Wt 180.0 lb

## 2014-12-18 DIAGNOSIS — I481 Persistent atrial fibrillation: Secondary | ICD-10-CM | POA: Diagnosis not present

## 2014-12-18 DIAGNOSIS — I5032 Chronic diastolic (congestive) heart failure: Secondary | ICD-10-CM

## 2014-12-18 DIAGNOSIS — I2510 Atherosclerotic heart disease of native coronary artery without angina pectoris: Secondary | ICD-10-CM | POA: Diagnosis not present

## 2014-12-18 DIAGNOSIS — E785 Hyperlipidemia, unspecified: Secondary | ICD-10-CM | POA: Diagnosis not present

## 2014-12-18 DIAGNOSIS — I10 Essential (primary) hypertension: Secondary | ICD-10-CM

## 2014-12-18 DIAGNOSIS — I4819 Other persistent atrial fibrillation: Secondary | ICD-10-CM

## 2014-12-18 DIAGNOSIS — E669 Obesity, unspecified: Secondary | ICD-10-CM | POA: Diagnosis not present

## 2014-12-18 MED ORDER — LOSARTAN POTASSIUM 50 MG PO TABS
50.0000 mg | ORAL_TABLET | Freq: Every day | ORAL | Status: DC
Start: 2014-12-18 — End: 2015-11-25

## 2014-12-18 NOTE — Assessment & Plan Note (Signed)
Currently not on a statin due to myalgias. Recommended we could try low-dose Crestor. He will think about this Goal LDL less than 70

## 2014-12-18 NOTE — Assessment & Plan Note (Signed)
Appears relatively euvolemic. He will change to Lasix as torsemide not on his formulary

## 2014-12-18 NOTE — Assessment & Plan Note (Signed)
Currently with no symptoms of angina. No further workup at this time.   

## 2014-12-18 NOTE — Patient Instructions (Signed)
You are doing well. No medication changes were made.  Hold the aspirin (or no more than 81 mg daily)  Please call us if you have new issues that need to be addressed before your next appt.  Your physician wants you to follow-up in: 6 months.  You will receive a reminder letter in the mail two months in advance. If you don't receive a letter, please call our office to schedule the follow-up appointment.

## 2014-12-18 NOTE — Assessment & Plan Note (Signed)
We have encouraged continued exercise, careful diet management in an effort to lose weight. 

## 2014-12-18 NOTE — Assessment & Plan Note (Signed)
Chronic age fibrillation, heart rate relatively well-controlled. On low-dose eliquis. Recommended he hold his aspirin

## 2014-12-18 NOTE — Progress Notes (Signed)
Patient ID: [name], male    DOB: September 14, 1927, 81 y.o.   MRN: [id number]  HPI Comments: [name] is a 81 year old gentleman presents for routine followup of his atrial fibrillation. history of CAD, angioplasty in August 1996. PTCA of a diagonal branch and OM in 1996 [name] has obstructive sleep apnea, not on CPAP  hospitalization 07/07/2013 for diverticular bleed, atrial fibrillation. [name] was given antibiotics for diverticulitis. Seen by Dr. Mechele Collin. No colonoscopy was performed. [name] was not started on anticoagulation by cardiology given his GI bleeding and anemia.  In followup today, [name] reports that [name] is feeling well. [name] continues to have chronic leg edema. [name] takes torsemide daily, occasionally takes metolazone for weight gain. Weight has been relatively stable. [name] is bothered by his insurance company which has changed his formulary. Torsemide no longer available. Primary care recommended [name] change to Lasix with metolazone as needed. Also reports Avodart is no longer available. [name] has run out of this but has not noted any change in his symptoms, nocturia. Typically goes to urination at night every 2-3 hours even on the medication. Denies any shortness of breath, chest pain concerning for angina   Previously had problems on Lipitor, muscle ache. Also recently stopped lovastatin. Currently not on any cholesterol medication Previous blood showing creatinine 1.6, BUN 29, hemoglobin A1c 7.6 Carvedilol previously decreased for bradycardia.   EKG on today's visit shows atrial fibrillation with rate 57 bpm, no significant ST or T-wave changes  Other past medical history  dizziness while playing golf 05/16/2013. [name] felt [name] was having the flu. Seen by PMD and given prednisone with antibiotics.   Developed blood per rectum in January 2015 . [name] was anemic in the hospital, hemoglobin in the 9 range   echocardiogram 06/26/2013 showing ejection fraction 45-50%, right ventricular systolic pressure 68  mmHg. [name] was instructed to increase his torsemide to 40 mg alternating with 20 mg Repeat echocardiogram July 08 2013 showed right ventricular systolic pressure 49, normal ejection fraction   normal stress test March 2009, normal ejection fraction by echocardiogram March 2009 [name] had a PTCA of a diagonal branch and OM in 1996  Allergies; 2 Exforge in 2008 with tongue swelling, rash     Allergies  Allergen Reactions  . Exforge [Amlodipine Besylate-Valsartan]   . Lisinopril   . Niacin And Related     Outpatient Encounter Prescriptions as of 12/18/2014  Medication Sig  . apixaban (ELIQUIS) 2.5 MG TABS tablet Take 1 tablet (2.5 mg total) by mouth 2 (two) times daily.  . carvedilol (COREG) 6.25 MG tablet Take 1 tablet (6.25 mg total) by mouth 2 (two) times daily with a meal.  . Coenzyme Q10 (CO Q 10) 100 MG CAPS Take 200 mg by mouth daily.  Marland Kitchen glipiZIDE (GLUCOTROL) 10 MG tablet Take 10 mg by mouth daily before breakfast.  . glucose blood test strip 1 each by Other route as needed for other. Use as instructed  . Insulin Detemir (LEVEMIR FLEXPEN) 100 UNIT/ML Pen Inject 12 Units into the skin daily at 10 pm.  . Insulin Pen Needle 31G X 6 MM MISC Use as directed  . IRON, FERROUS GLUCONATE, PO Take 150 mg by mouth 2 (two) times daily.  Marland Kitchen losartan (COZAAR) 50 MG tablet Take 1 tablet (50 mg total) by mouth daily.  . Magnesium 200 MG TABS Take 200 mg by mouth daily.   . metolazone (ZAROXOLYN) 5 MG tablet Take 1 tablet (5 mg total) by mouth daily as needed.  Marland Kitchen  potassium chloride 20 MEQ TBCR Take 20 mEq by mouth daily. (Patient taking differently: Take 20 mEq by mouth daily. Takes additional tablet on Mon., Wed and Fridays.)  . sitaGLIPtin-metformin (JANUMET) 50-1000 MG per tablet Take 1 tablet by mouth 2 (two) times daily with a meal.  . torsemide (DEMADEX) 20 MG tablet Take 20 mg by mouth daily.   . [DISCONTINUED] aspirin 325 MG tablet Take 325 mg by mouth daily.  . [DISCONTINUED] losartan  (COZAAR) 100 MG tablet Take 50 mg by mouth daily.   Marland Kitchen dutasteride (AVODART) 0.5 MG capsule Take 0.5 mg by mouth 3 (three) times a week.   . [DISCONTINUED] lovastatin (MEVACOR) 10 MG tablet Take 10 mg by mouth at bedtime.   No facility-administered encounter medications on file as of 12/18/2014.    Past Medical History  Diagnosis Date  . Pure hypercholesterolemia   . Type II or unspecified type diabetes mellitus without mention of complication, uncontrolled   . Hypersomnia with sleep apnea, unspecified   . Other and unspecified hyperlipidemia   . Essential hypertension, benign   . Coronary artery disease   . BPH (benign prostatic hypertrophy)   . Diverticulosis   . MI (myocardial infarction)     Past Surgical History  Procedure Laterality Date  . Tonsillectomy    . Inguinal hernia repair      left inguinal   . Cholecystectomy  1994  . Flexible sigmoidoscopy    . Cardiac catheterization    . Coronary angioplasty  1996    [place]    Social History  reports that [name] has quit smoking. His smoking use included Cigarettes. [name] quit after 70 years of use. [name] does not have any smokeless tobacco history on file. [name] reports that [name] does not drink alcohol or use illicit drugs.  Family History Family history is unknown by patient.   Review of Systems  Constitutional: Negative.   Eyes: Negative.   Respiratory: Negative.   Cardiovascular: Negative.   Gastrointestinal: Negative.   Genitourinary: Positive for frequency.  Musculoskeletal: Positive for myalgias.  Neurological: Negative.   Hematological: Negative.   Psychiatric/Behavioral: Negative.   All other systems reviewed and are negative.   BP 140/70 mmHg  Pulse 53  Ht  (1.651 m)  Wt 180 lb (81.647 kg)  BMI 29.95 kg/m2  Physical Exam  Constitutional: [name] is oriented to person, place, and time. [name] appears well-developed and well-nourished.  HENT:  Head: Normocephalic.  Nose: Nose normal.  Mouth/Throat: Oropharynx  is clear and moist.  Eyes: Conjunctivae are normal. Pupils are equal, round, and reactive to light.  Neck: Normal range of motion. Neck supple. No JVD present.  Cardiovascular: Normal rate, regular rhythm, S1 normal, S2 normal, normal heart sounds and intact distal pulses.  Exam reveals no gallop and no friction rub.   No murmur heard. Pulmonary/Chest: Effort normal and breath sounds normal. No respiratory distress. [name] has no wheezes. [name] has no rales. [name] exhibits no tenderness.  Abdominal: Soft. Bowel sounds are normal. [name] exhibits no distension. There is no tenderness.  Musculoskeletal: Normal range of motion. [name] exhibits no edema or tenderness.  Lymphadenopathy:    [name] has no cervical adenopathy.  Neurological: [name] is alert and oriented to person, place, and time. Coordination normal.  Skin: Skin is warm and dry. No rash noted. No erythema.  Psychiatric: [name] has a normal mood and affect. His behavior is normal. Judgment and thought content normal.      Assessment and Plan  Nursing note and vitals reviewed.

## 2014-12-18 NOTE — Assessment & Plan Note (Signed)
Blood pressure is well controlled on today's visit. No changes made to the medications. 

## 2015-02-21 ENCOUNTER — Encounter: Payer: Self-pay | Admitting: Emergency Medicine

## 2015-02-21 ENCOUNTER — Inpatient Hospital Stay
Admission: EM | Admit: 2015-02-21 | Discharge: 2015-02-23 | DRG: 378 | Disposition: A | Discharge status: Home / Self Care | Payer: Medicare Other | Attending: Internal Medicine | Admitting: Internal Medicine

## 2015-02-21 DIAGNOSIS — Z87891 Personal history of nicotine dependence: Secondary | ICD-10-CM

## 2015-02-21 DIAGNOSIS — E119 Type 2 diabetes mellitus without complications: Secondary | ICD-10-CM | POA: Diagnosis not present

## 2015-02-21 DIAGNOSIS — E780 Pure hypercholesterolemia: Secondary | ICD-10-CM | POA: Diagnosis present

## 2015-02-21 DIAGNOSIS — E785 Hyperlipidemia, unspecified: Secondary | ICD-10-CM | POA: Diagnosis present

## 2015-02-21 DIAGNOSIS — Z9861 Coronary angioplasty status: Secondary | ICD-10-CM | POA: Diagnosis not present

## 2015-02-21 DIAGNOSIS — Z7982 Long term (current) use of aspirin: Secondary | ICD-10-CM | POA: Diagnosis not present

## 2015-02-21 DIAGNOSIS — G4730 Sleep apnea, unspecified: Secondary | ICD-10-CM | POA: Diagnosis not present

## 2015-02-21 DIAGNOSIS — Z9049 Acquired absence of other specified parts of digestive tract: Secondary | ICD-10-CM | POA: Diagnosis not present

## 2015-02-21 DIAGNOSIS — K5731 Diverticulosis of large intestine without perforation or abscess with bleeding: Secondary | ICD-10-CM | POA: Diagnosis present

## 2015-02-21 DIAGNOSIS — Z820 Family history of epilepsy and other diseases of the nervous system: Secondary | ICD-10-CM

## 2015-02-21 DIAGNOSIS — K922 Gastrointestinal hemorrhage, unspecified: Secondary | ICD-10-CM

## 2015-02-21 DIAGNOSIS — G4710 Hypersomnia, unspecified: Secondary | ICD-10-CM | POA: Diagnosis present

## 2015-02-21 DIAGNOSIS — N400 Enlarged prostate without lower urinary tract symptoms: Secondary | ICD-10-CM | POA: Diagnosis not present

## 2015-02-21 DIAGNOSIS — I10 Essential (primary) hypertension: Secondary | ICD-10-CM | POA: Diagnosis present

## 2015-02-21 DIAGNOSIS — D62 Acute posthemorrhagic anemia: Secondary | ICD-10-CM

## 2015-02-21 DIAGNOSIS — I482 Chronic atrial fibrillation: Secondary | ICD-10-CM | POA: Diagnosis not present

## 2015-02-21 DIAGNOSIS — Z794 Long term (current) use of insulin: Secondary | ICD-10-CM | POA: Diagnosis not present

## 2015-02-21 DIAGNOSIS — K921 Melena: Secondary | ICD-10-CM

## 2015-02-21 DIAGNOSIS — I252 Old myocardial infarction: Secondary | ICD-10-CM

## 2015-02-21 DIAGNOSIS — I2510 Atherosclerotic heart disease of native coronary artery without angina pectoris: Secondary | ICD-10-CM | POA: Diagnosis present

## 2015-02-21 HISTORY — DX: Gastrointestinal hemorrhage, unspecified: K92.2

## 2015-02-21 LAB — COMPREHENSIVE METABOLIC PANEL
ALT: 14 U/L — AB (ref 17–63)
AST: 27 U/L (ref 15–41)
Albumin: 3.6 g/dL (ref 3.5–5.0)
Alkaline Phosphatase: 72 U/L (ref 38–126)
Anion gap: 8 (ref 5–15)
BUN: 30 mg/dL — ABNORMAL HIGH (ref 6–20)
CHLORIDE: 107 mmol/L (ref 101–111)
CO2: 25 mmol/L (ref 22–32)
CREATININE: 1.56 mg/dL — AB (ref 0.61–1.24)
Calcium: 8.7 mg/dL — ABNORMAL LOW (ref 8.9–10.3)
GFR calc non Af Amer: 39 mL/min — ABNORMAL LOW (ref >60)
GFR, EST AFRICAN AMERICAN: 45 mL/min — AB (ref >60)
Glucose, Bld: 125 mg/dL — ABNORMAL HIGH (ref 65–99)
POTASSIUM: 3.7 mmol/L (ref 3.5–5.1)
SODIUM: 140 mmol/L (ref 135–145)
Total Bilirubin: 0.7 mg/dL (ref 0.3–1.2)
Total Protein: 8.1 g/dL (ref 6.5–8.1)

## 2015-02-21 LAB — CBC
HEMATOCRIT: 27.4 % — AB (ref 40.0–52.0)
Hemoglobin: 8.4 g/dL — ABNORMAL LOW (ref 13.0–18.0)
MCH: 23.9 pg — AB (ref 26.0–34.0)
MCHC: 30.8 g/dL — ABNORMAL LOW (ref 32.0–36.0)
MCV: 77.7 fL — AB (ref 80.0–100.0)
PLATELETS: 205 10*3/uL (ref 150–440)
RBC: 3.52 MIL/uL — AB (ref 4.40–5.90)
RDW: 21.8 % — ABNORMAL HIGH (ref 11.5–14.5)
WBC: 7.5 10*3/uL (ref 3.8–10.6)

## 2015-02-21 LAB — HEMOGLOBIN: Hemoglobin: 7 g/dL — ABNORMAL LOW (ref 13.0–18.0)

## 2015-02-21 LAB — PREPARE RBC (CROSSMATCH)

## 2015-02-21 LAB — GLUCOSE, CAPILLARY: GLUCOSE-CAPILLARY: 132 mg/dL — AB (ref 65–99)

## 2015-02-21 LAB — ABO/RH: ABO/RH(D): A POS

## 2015-02-21 MED ORDER — MAGNESIUM OXIDE 400 (241.3 MG) MG PO TABS
200.0000 mg | ORAL_TABLET | Freq: Every day | ORAL | Status: DC
Start: 2015-02-21 — End: 2015-02-23
  Administered 2015-02-21 – 2015-02-23 (×3): 200 mg via ORAL
  Filled 2015-02-21 (×4): qty 1

## 2015-02-21 MED ORDER — FINASTERIDE 5 MG PO TABS
5.0000 mg | ORAL_TABLET | Freq: Every day | ORAL | Status: DC
Start: 2015-02-21 — End: 2015-02-23
  Administered 2015-02-21 – 2015-02-22 (×2): 5 mg via ORAL
  Filled 2015-02-21 (×2): qty 1

## 2015-02-21 MED ORDER — ACETAMINOPHEN 325 MG PO TABS
650.0000 mg | ORAL_TABLET | Freq: Four times a day (QID) | ORAL | Status: DC | PRN
  Administered 2015-02-22: 650 mg via ORAL
  Filled 2015-02-21: qty 2

## 2015-02-21 MED ORDER — INSULIN ASPART 100 UNIT/ML ~~LOC~~ SOLN
0.0000 [IU] | Freq: Three times a day (TID) | SUBCUTANEOUS | Status: DC
  Administered 2015-02-22 (×2): 1 [IU] via SUBCUTANEOUS
  Administered 2015-02-23: 3 [IU] via SUBCUTANEOUS
  Administered 2015-02-23: 1 [IU] via SUBCUTANEOUS
  Filled 2015-02-21: qty 3
  Filled 2015-02-21 (×3): qty 1

## 2015-02-21 MED ORDER — INSULIN ASPART 100 UNIT/ML ~~LOC~~ SOLN: 0.0000 [IU] | Freq: Every day | SUBCUTANEOUS | Status: DC

## 2015-02-21 MED ORDER — ACETAMINOPHEN 650 MG RE SUPP: 650.0000 mg | Freq: Four times a day (QID) | RECTAL | Status: DC | PRN

## 2015-02-21 MED ORDER — SODIUM CHLORIDE 0.9 % IV SOLN
10.0000 mL/h | Freq: Once | INTRAVENOUS | Status: AC
  Administered 2015-02-22: 10 mL/h via INTRAVENOUS

## 2015-02-21 MED ORDER — ONDANSETRON HCL 4 MG/2ML IJ SOLN: 4.0000 mg | Freq: Four times a day (QID) | INTRAMUSCULAR | Status: DC | PRN

## 2015-02-21 MED ORDER — POTASSIUM CHLORIDE CRYS ER 20 MEQ PO TBCR
20.0000 meq | EXTENDED_RELEASE_TABLET | Freq: Every day | ORAL | Status: DC
  Administered 2015-02-22 – 2015-02-23 (×2): 20 meq via ORAL
  Filled 2015-02-21 (×2): qty 1

## 2015-02-21 MED ORDER — CARVEDILOL 6.25 MG PO TABS
6.2500 mg | ORAL_TABLET | Freq: Two times a day (BID) | ORAL | Status: DC
  Administered 2015-02-22 – 2015-02-23 (×3): 6.25 mg via ORAL
  Filled 2015-02-21 (×4): qty 1

## 2015-02-21 MED ORDER — ONDANSETRON HCL 4 MG PO TABS: 4.0000 mg | ORAL_TABLET | Freq: Four times a day (QID) | ORAL | Status: DC | PRN

## 2015-02-21 MED ORDER — CO Q 10 100 MG PO CAPS: 100.0000 mg | ORAL_CAPSULE | Freq: Every day | ORAL | Status: DC

## 2015-02-21 MED ORDER — SODIUM CHLORIDE 0.9 % IV SOLN
INTRAVENOUS | Status: DC
  Administered 2015-02-22 (×3): via INTRAVENOUS

## 2015-02-21 NOTE — ED Notes (Signed)
Patient to ED from [facility] with report of rectal bleeding that began this am, patient reports approximately 6 times since this am. Patient/wife reports bowl was full of blood. Patient reports this has happened before approximately 1.5-2 years ago.

## 2015-02-21 NOTE — ED Notes (Signed)
Phone call from lab that blood is ready to be picked up.

## 2015-02-21 NOTE — ED Notes (Signed)
Patient states he does not feel like he is having a BM but that is just pure blood.

## 2015-02-21 NOTE — ED Provider Notes (Signed)
[facility] Emergency Department Provider Note  ____________________________________________  Time seen: 5:20 PM  I have reviewed the triage vital signs and the nursing notes.   HISTORY  Chief Complaint Rectal Bleeding    HPI [name] is a 81 y.o. male who complains of multiple grossly bloody bowel movements today. He denies any chest pain or shortness of breath, denies abdominal pain or nausea. He had a similar episode about 1.5 years ago. Review of the records indicates that he was hospitalized due to acute anemia from lower GI bleeding which was thought to be due to his pancolonic diverticulosis, although a bleeding scan was negative by the time it was able to be completed. Patient reports that he is no longer taking Eliquis and does not take any other anticoagulants.     Past Medical History  Diagnosis Date  . Pure hypercholesterolemia   . Type II or unspecified type diabetes mellitus without mention of complication, uncontrolled   . Hypersomnia with sleep apnea, unspecified   . Other and unspecified hyperlipidemia   . Essential hypertension, benign   . Coronary artery disease   . BPH (benign prostatic hypertrophy)   . Diverticulosis   . MI (myocardial infarction)     Patient Active Problem List   Diagnosis Date Noted  . Bradycardia 06/17/2014  . Essential hypertension 12/19/2013  . Coronary artery disease 07/27/2013  . Status post coronary angioplasty 07/27/2013  . Diabetes mellitus type 2 with complications 07/27/2013  . Atrial fibrillation 07/27/2013  . Obesity 07/27/2013  . Hyperlipidemia 07/27/2013  . Chronic diastolic CHF (congestive heart failure) 07/27/2013  . Lower GI bleed 07/27/2013    Past Surgical History  Procedure Laterality Date  . Tonsillectomy    . Inguinal hernia repair      left inguinal   . Cholecystectomy  1994  . Flexible sigmoidoscopy    . Cardiac catheterization    . Coronary angioplasty  1996    [place]    Current Outpatient Rx  Name  Route  Sig  Dispense  Refill  . apixaban (ELIQUIS) 2.5 MG TABS tablet   Oral   Take 1 tablet (2.5 mg total) by mouth 2 (two) times daily.   180 tablet   3   . carvedilol (COREG) 6.25 MG tablet   Oral   Take 1 tablet (6.25 mg total) by mouth 2 (two) times daily with a meal.   180 tablet   3   . Coenzyme Q10 (CO Q 10) 100 MG CAPS   Oral   Take 200 mg by mouth daily.         Marland Kitchen dutasteride (AVODART) 0.5 MG capsule   Oral   Take 0.5 mg by mouth 3 (three) times a week.          Marland Kitchen glipiZIDE (GLUCOTROL) 10 MG tablet   Oral   Take 10 mg by mouth daily before breakfast.         . Insulin Detemir (LEVEMIR FLEXPEN) 100 UNIT/ML Pen   Subcutaneous   Inject 12 Units into the skin daily at 10 pm.         . Insulin Pen Needle 31G X 6 MM MISC      Use as directed   100 each   0   . IRON, FERROUS GLUCONATE, PO   Oral   Take 150 mg by mouth 2 (two) times daily.         Marland Kitchen losartan (COZAAR) 50 MG tablet  Oral   Take 1 tablet (50 mg total) by mouth daily.   90 tablet   3   . Magnesium 200 MG TABS   Oral   Take 200 mg by mouth daily.          . metolazone (ZAROXOLYN) 5 MG tablet   Oral   Take 1 tablet (5 mg total) by mouth daily as needed.   30 tablet   1   . potassium chloride 20 MEQ TBCR   Oral   Take 20 mEq by mouth daily. Patient taking differently: Take 20 mEq by mouth daily. Takes additional tablet on Mon., Wed and Fridays.   30 tablet   6   . sitaGLIPtin-metformin (JANUMET) 50-1000 MG per tablet   Oral   Take 1 tablet by mouth 2 (two) times daily with a meal.         . torsemide (DEMADEX) 20 MG tablet   Oral   Take 20 mg by mouth daily.            Allergies Exforge; Lisinopril; and Niacin and related  Family History  Problem Relation Age of Onset  . Family history unknown: Yes    Social History Social History  Substance Use Topics  . Smoking status: Former Smoker -- 70 years    Types:  Cigarettes  . Smokeless tobacco: None  . Alcohol Use: No    Review of Systems  Constitutional: No fever or chills. No weight changes Eyes:No blurry vision or double vision.  ENT: No sore throat. Cardiovascular: No chest pain. Respiratory: No dyspnea or cough. Gastrointestinal: Negative for abdominal pain, vomiting and diarrhea.  Positive BRBPR. Genitourinary: Negative for dysuria, urinary retention, bloody urine, or difficulty urinating. Musculoskeletal: Negative for back pain. No joint swelling or pain. Skin: Negative for rash. Neurological: Negative for headaches, focal weakness or numbness. Psychiatric:No anxiety or depression.   Endocrine:No hot/cold intolerance, changes in energy, or sleep difficulty.  10-point ROS otherwise negative.  ____________________________________________   PHYSICAL EXAM:  VITAL SIGNS: ED Triage Vitals  Enc Vitals Group     BP 02/21/15 1524 135/102 mmHg     Pulse Rate 02/21/15 1524 58     Resp 02/21/15 1524 20     Temp 02/21/15 1524 98.5 F (36.9 C)     Temp Source 02/21/15 1524 Oral     SpO2 02/21/15 1524 100 %     Weight 02/21/15 1524 174 lb (78.926 kg)     Height 02/21/15 1524  (1.651 m)     Head Cir --      Peak Flow --      Pain Score 02/21/15 1733 0     Pain Loc --      Pain Edu? --      Excl. in GC? --      Constitutional: Alert and oriented. Well appearing and in no distress. Eyes: No scleral icterus. No conjunctival pallor. PERRL. EOMI ENT   Head: Normocephalic and atraumatic.   Nose: No congestion/rhinnorhea. No septal hematoma   Mouth/Throat: MMM, no pharyngeal erythema. No peritonsillar mass. No uvula shift.   Neck: No stridor. No SubQ emphysema. No meningismus. Hematological/Lymphatic/Immunilogical: No cervical lymphadenopathy. Cardiovascular: RRR. Normal and symmetric distal pulses are present in all extremities. No murmurs, rubs, or gallops. Respiratory: Normal respiratory effort without tachypnea  nor retractions. Breath sounds are clear and equal bilaterally. No wheezes/rales/rhonchi. Gastrointestinal: Soft with mild tenderness in the left upper quadrant and epigastrium. No distention. There is no CVA tenderness.  No rebound, rigidity, or guarding. Patient has had a grossly bloody bowel movement without stool in the toilet in the treatment room. Genitourinary: deferred Musculoskeletal: Nontender with normal range of motion in all extremities. No joint effusions.  No lower extremity tenderness.  No edema. Neurologic:   Normal speech and language.  CN 2-10 normal. Motor grossly intact. No pronator drift.  Normal gait. No gross focal neurologic deficits are appreciated.  Skin:  Skin is warm, dry and intact. No rash noted.  No petechiae, purpura, or bullae. Psychiatric: Mood and affect are normal. Speech and behavior are normal. Patient exhibits appropriate insight and judgment.  ____________________________________________    LABS (pertinent positives/negatives) (all labs ordered are listed, but only abnormal results are displayed) Labs Reviewed  COMPREHENSIVE METABOLIC PANEL - Abnormal; Notable for the following:    Glucose, Bld 125 (*)    BUN 30 (*)    Creatinine, Ser 1.56 (*)    Calcium 8.7 (*)    ALT 14 (*)    GFR calc non Af Amer 39 (*)    GFR calc Af Amer 45 (*)    All other components within normal limits  CBC - Abnormal; Notable for the following:    RBC 3.52 (*)    Hemoglobin 8.4 (*)    HCT 27.4 (*)    MCV 77.7 (*)    MCH 23.9 (*)    MCHC 30.8 (*)    RDW 21.8 (*)    All other components within normal limits  POC OCCULT BLOOD, ED  TYPE AND SCREEN  ABO/RH  PREPARE RBC (CROSSMATCH)   ____________________________________________   EKG  EKG at 1521 interpreted by me, reveals atrial fibrillation with a rate of 53. Normal axis intervals gross ST segments and T waves.  G performed at 1618 p.m. Route interpreted by me Neutrophils ablation rate of 103, normal  axis intervals QRS and ST segments and T waves.  ____________________________________________    RADIOLOGY    ____________________________________________   PROCEDURES CRITICAL CARE Performed by: Sharman Cheek   Total critical care time: 35 minutes  Critical care time was exclusive of separately billable procedures and treating other patients.  Critical care was necessary to treat or prevent imminent or life-threatening deterioration.  Critical care was time spent personally by me on the following activities: development of treatment plan with patient and/or surrogate as well as nursing, discussions with consultants, evaluation of patient's response to treatment, examination of patient, obtaining history from patient or surrogate, ordering and performing treatments and interventions, ordering and review of laboratory studies, ordering and review of radiographic studies, pulse oximetry and re-evaluation of patient's condition.  ____________________________________________   INITIAL IMPRESSION / ASSESSMENT AND PLAN / ED COURSE  Pertinent labs & imaging results that were available during my care of the patient were reviewed by me and considered in my medical decision making (see chart for details).  Patient presents with acute lower GI bleed. No vomiting or concern for upper GI bleeding source at this time. This is most likely due to his severe diverticulosis, and the location of tenderness points to the transverse colon. Due to the precipitous bleed with a drop in his hemoglobin from 10 in April 2 8 today, we'll go ahead and start transfusion and type and cross due to high risk of rapid deterioration with ongoing blood loss. We'll give IV fluids until blood is available. ----------------------------------------- 6:07 PM on 02/21/2015 -----------------------------------------  Vitals remained stable for now. Case was discussed with the hospitalist will evaluate the  patient for  admission. ____________________________________________   FINAL CLINICAL IMPRESSION(S) / ED DIAGNOSES  Final diagnoses:  Acute lower GI bleeding  Acute blood loss anemia      Sharman Cheek, MD 02/21/15 [phone]

## 2015-02-21 NOTE — H&P (Signed)
[facility] - [place] at [facility]   PATIENT NAME: [name]    MR#:  [id number]  DATE OF BIRTH:  04-20-1928  DATE OF ADMISSION:  02/21/2015  PRIMARY CARE PHYSICIAN: Lauro Regulus., MD   REQUESTING/REFERRING PHYSICIAN: Dr. Alfonse Flavors  CHIEF COMPLAINT:   Chief Complaint  Patient presents with  . Rectal Bleeding    HISTORY OF PRESENT ILLNESS:  [name]  is a 81 y.o. male with a known history of diabetes, hypertension, hyperlipidemia, a pH, history of diverticulosis, who presents to the hospital due to multiple episodes of rectal bleeding. Patient says that he's had about 7-8 episodes of rectal bleeding since this morning. Initially his stool was mixed with blood but now for the past 3-4 bowel movements it's all been blood. Patient says he had similar episode like this about a year and a happy half ago and was noted to be secondary to diverticulosis. Patient is on Eliquis and aspirin which she did not take this morning when his bleeding started. He denies any abdominal pain, nausea, vomiting, fever, chills or any other associated symptoms. Given his progressive rectal bleeding and a drop in his hemoglobin hospitalist services were contacted for further treatment and evaluation.    PAST MEDICAL HISTORY:   Past Medical History  Diagnosis Date  . Pure hypercholesterolemia   . Type II or unspecified type diabetes mellitus without mention of complication, uncontrolled   . Hypersomnia with sleep apnea, unspecified   . Other and unspecified hyperlipidemia   . Essential hypertension, benign   . Coronary artery disease   . BPH (benign prostatic hypertrophy)   . Diverticulosis   . MI (myocardial infarction)     PAST SURGICAL HISTORY:   Past Surgical History  Procedure Laterality Date  . Tonsillectomy    . Inguinal hernia repair      left inguinal   . Cholecystectomy  1994  . Flexible sigmoidoscopy    . Cardiac catheterization    .  Coronary angioplasty  1996    [place]    SOCIAL HISTORY:   Social History  Substance Use Topics  . Smoking status: Former Smoker -- 1.00 packs/day for 50 years    Types: Cigarettes  . Smokeless tobacco: Not on file  . Alcohol Use: No    FAMILY HISTORY:   Family History  Problem Relation Age of Onset  . Rheum arthritis Mother   . Alzheimer's disease Father     DRUG ALLERGIES:   Allergies  Allergen Reactions  . Exforge [Amlodipine Besylate-Valsartan] Swelling and Other (See Comments)    Pt states that he is unable to swallow.    . Lisinopril Swelling and Other (See Comments)    Pt states that he is unable to swallow.    . Niacin And Related Swelling and Other (See Comments)    Pt states that he is unable to swallow.      REVIEW OF SYSTEMS:   Review of Systems  Constitutional: Negative for fever and weight loss.  HENT: Negative for congestion, nosebleeds and tinnitus.   Eyes: Negative for blurred vision, double vision and redness.  Respiratory: Negative for cough, hemoptysis and shortness of breath.   Cardiovascular: Negative for chest pain, orthopnea, leg swelling and PND.  Gastrointestinal: Positive for blood in stool. Negative for nausea, vomiting, abdominal pain, diarrhea and melena.  Genitourinary: Negative for dysuria, urgency and hematuria.  Musculoskeletal: Negative for joint pain and falls.  Neurological: Negative for dizziness, tingling, sensory change, focal weakness,  seizures, weakness and headaches.  Endo/Heme/Allergies: Negative for polydipsia. Does not bruise/bleed easily.  Psychiatric/Behavioral: Negative for depression and memory loss. The patient is not nervous/anxious.     MEDICATIONS AT HOME:   Prior to Admission medications   Medication Sig Start Date End Date Taking? Authorizing Provider  apixaban (ELIQUIS) 2.5 MG TABS tablet Take 1 tablet (2.5 mg total) by mouth 2 (two) times daily. 06/17/14  Yes Antonieta Iba, MD  aspirin EC 81 MG  tablet Take 81 mg by mouth daily.   Yes Historical Provider, MD  carvedilol (COREG) 6.25 MG tablet Take 1 tablet (6.25 mg total) by mouth 2 (two) times daily with a meal. 06/17/14  Yes Antonieta Iba, MD  Coenzyme Q10 (CO Q 10) 100 MG CAPS Take 100 mg by mouth daily.    Yes Historical Provider, MD  finasteride (PROSCAR) 5 MG tablet Take 5 mg by mouth at bedtime.   Yes Historical Provider, MD  furosemide (LASIX) 40 MG tablet Take 40-80 mg by mouth 2 (two) times daily. Pt takes two in the morning and one in the evening.   Yes Historical Provider, MD  glipiZIDE (GLUCOTROL) 10 MG tablet Take 10 mg by mouth at bedtime.    Yes Historical Provider, MD  insulin glargine (LANTUS) 100 UNIT/ML injection Inject 12 Units into the skin daily.   Yes Historical Provider, MD  losartan (COZAAR) 50 MG tablet Take 1 tablet (50 mg total) by mouth daily. 12/18/14  Yes Antonieta Iba, MD  Magnesium 200 MG TABS Take 200 mg by mouth daily.    Yes Historical Provider, MD  metolazone (ZAROXOLYN) 5 MG tablet Take 1 tablet (5 mg total) by mouth daily as needed. Patient taking differently: Take 5 mg by mouth daily as needed (for edema).  12/19/13  Yes Antonieta Iba, MD  potassium chloride 20 MEQ TBCR Take 20 mEq by mouth daily. Patient taking differently: Take 20-40 mEq by mouth daily. Pt takes two tablets on Monday, Wednesday, and Friday and takes one tablet all other days. 06/17/14  Yes Antonieta Iba, MD  sitaGLIPtin-metformin (JANUMET) 50-1000 MG per tablet Take 1 tablet by mouth 2 (two) times daily with a meal.   Yes Historical Provider, MD      VITAL SIGNS:  Blood pressure 145/59, pulse 38, temperature 98.5 F (36.9 C), temperature source Oral, resp. rate 16, height  (1.651 m), weight 78.926 kg (174 lb), SpO2 97 %.  PHYSICAL EXAMINATION:  Physical Exam  GENERAL:  81 y.o.-year-old patient lying in the bed with no acute distress.  EYES: Pupils equal, round, reactive to light and accommodation. No scleral  icterus. Extraocular muscles intact.  HEENT: Head atraumatic, normocephalic. Oropharynx and nasopharynx clear. No oropharyngeal erythema, moist oral mucosa  NECK:  Supple, no jugular venous distention. No thyroid enlargement, no tenderness.  LUNGS: Normal breath sounds bilaterally, no wheezing, rales, rhonchi. No use of accessory muscles of respiration.  CARDIOVASCULAR: S1, S2 Irregular. No murmurs, rubs, gallops, clicks.  ABDOMEN: Soft, nontender, nondistended. Bowel sounds present. No organomegaly or mass.  EXTREMITIES: No pedal edema, cyanosis, or clubbing. + 2 pedal & radial pulses b/l.   NEUROLOGIC: Cranial nerves II through XII are intact. No focal Motor or sensory deficits appreciated b/l PSYCHIATRIC: The patient is alert and oriented x 3. Good affect.  SKIN: No obvious rash, lesion, or ulcer.   LABORATORY PANEL:   CBC  Recent Labs Lab 02/21/15 1529  WBC 7.5  HGB 8.4*  HCT 27.4*  PLT  205   ------------------------------------------------------------------------------------------------------------------  Chemistries   Recent Labs Lab 02/21/15 1529  NA 140  K 3.7  CL 107  CO2 25  GLUCOSE 125*  BUN 30*  CREATININE 1.56*  CALCIUM 8.7*  AST 27  ALT 14*  ALKPHOS 72  BILITOT 0.7   ------------------------------------------------------------------------------------------------------------------  Cardiac Enzymes No results for input(s): TROPONINI in the last 168 hours. ------------------------------------------------------------------------------------------------------------------  RADIOLOGY:  No results found.   IMPRESSION AND PLAN:   81 year old male with past medical history of diabetes, hypertension, hyperlipidemia, BPH, history of diverticulosis, who presented to the hospital due to multiple episodes of rectal bleeding.   #1 acute GI bleed-this is likely a lower GI bleed given the patient's hematochezia. I suspect this is likely a diverticular GI  bleed. -Patient continues to have active bleeding and therefore I will order a nuclear medicine GI bleeding scan -Follow serial hemoglobin. Hold aspirin, Eliquis. Get a gastroenterology consult. -Patient has been ordered for transfusion of 1 unit of packed red blood cells.  #2 history of chronic afibrillation-continue Coreg, hold Eliquis, aspirin.  #3 BPH-continue finasteride.  #4 diabetes type 2 without complication-hold metformin, glipizide, scheduled insulin.  -Continue sliding scale insulin.  #5 history of hypertension-continue Coreg. I will hold other antihypertensives given his acute GI bleed presently.   All the records are reviewed and case discussed with ED provider. Management plans discussed with the patient, family and they are in agreement.  CODE STATUS: Full  TOTAL TIME TAKING CARE OF THIS PATIENT: 45 minutes.    Houston Siren M.D on 02/21/2015 at 7:23 PM  Between 7am to 6pm - Pager - [phone]  After 6pm go to [email or web address] - password [email or web address]  [place] [facility]  Office  [phone]  CC: Primary care physician; Lauro Regulus., MD

## 2015-02-21 NOTE — ED Notes (Signed)
Pt. Used bathroom had bowel movement with one stool, moderate amount of blood (red/brown) in toilet bowel.

## 2015-02-22 ENCOUNTER — Inpatient Hospital Stay: Payer: Medicare Other

## 2015-02-22 LAB — BASIC METABOLIC PANEL
ANION GAP: 7 (ref 5–15)
BUN: 30 mg/dL — AB (ref 6–20)
CHLORIDE: 110 mmol/L (ref 101–111)
CO2: 24 mmol/L (ref 22–32)
Calcium: 8.2 mg/dL — ABNORMAL LOW (ref 8.9–10.3)
Creatinine, Ser: 1.44 mg/dL — ABNORMAL HIGH (ref 0.61–1.24)
GFR calc Af Amer: 50 mL/min — ABNORMAL LOW (ref >60)
GFR, EST NON AFRICAN AMERICAN: 43 mL/min — AB (ref >60)
GLUCOSE: 146 mg/dL — AB (ref 65–99)
POTASSIUM: 4.1 mmol/L (ref 3.5–5.1)
Sodium: 141 mmol/L (ref 135–145)

## 2015-02-22 LAB — GLUCOSE, CAPILLARY
Glucose-Capillary: 142 mg/dL — ABNORMAL HIGH (ref 65–99)
Glucose-Capillary: 129 mg/dL — ABNORMAL HIGH (ref 65–99)
Glucose-Capillary: 169 mg/dL — ABNORMAL HIGH (ref 65–99)

## 2015-02-22 LAB — CBC
HEMATOCRIT: 24.3 % — AB (ref 40.0–52.0)
HEMOGLOBIN: 7.7 g/dL — AB (ref 13.0–18.0)
MCH: 24.5 pg — AB (ref 26.0–34.0)
MCHC: 31.9 g/dL — AB (ref 32.0–36.0)
MCV: 76.7 fL — AB (ref 80.0–100.0)
Platelets: 159 10*3/uL (ref 150–440)
RBC: 3.16 MIL/uL — ABNORMAL LOW (ref 4.40–5.90)
RDW: 21.3 % — AB (ref 11.5–14.5)
WBC: 8.5 10*3/uL (ref 3.8–10.6)

## 2015-02-22 LAB — HEMOGLOBIN
HEMOGLOBIN: 7.2 g/dL — AB (ref 13.0–18.0)
Hemoglobin: 7.3 g/dL — ABNORMAL LOW (ref 13.0–18.0)

## 2015-02-22 LAB — OCCULT BLOOD X 1 CARD TO LAB, STOOL: Fecal Occult Bld: POSITIVE — AB

## 2015-02-22 MED ORDER — LORAZEPAM 2 MG/ML IJ SOLN
0.5000 mg | Freq: Once | INTRAMUSCULAR | Status: AC
  Administered 2015-02-22: 0.5 mg via INTRAVENOUS
  Filled 2015-02-22: qty 1

## 2015-02-22 MED ORDER — TECHNETIUM TC 99M-LABELED RED BLOOD CELLS IV KIT
21.5800 | PACK | Freq: Once | INTRAVENOUS | Status: AC | PRN
  Administered 2015-02-22: 21.58 via INTRAVENOUS

## 2015-02-22 MED ORDER — INFLUENZA VAC SPLIT QUAD 0.5 ML IM SUSY
0.5000 mL | PREFILLED_SYRINGE | INTRAMUSCULAR | Status: AC
  Administered 2015-02-23: 0.5 mL via INTRAMUSCULAR
  Filled 2015-02-22: qty 0.5

## 2015-02-22 NOTE — Progress Notes (Signed)
[facility] - [place] at [facility]   PATIENT NAME: [name]    MR#:  [id number]  DATE OF BIRTH:  [birth year]  SUBJECTIVE:  CHIEF COMPLAINT:  Patient reports one bowel movement with blood since this a.m.. Some abdominal pain in the left lower quadrant which is 3 out of 4  REVIEW OF SYSTEMS:  CONSTITUTIONAL: No fever, fatigue or weakness.  EYES: No blurred or double vision.  EARS, NOSE, AND THROAT: No tinnitus or ear pain.  RESPIRATORY: No cough, shortness of breath, wheezing or hemoptysis.  CARDIOVASCULAR: No chest pain, orthopnea, edema.  GASTROINTESTINAL: No nausea, vomiting, diarrhea .reporting left lower quadrant abdominal pain. Hematochezia GENITOURINARY: No dysuria, hematuria.  ENDOCRINE: No polyuria, nocturia,  HEMATOLOGY: No anemia, easy bruising or bleeding SKIN: No rash or lesion. MUSCULOSKELETAL: No joint pain or arthritis.   NEUROLOGIC: No tingling, numbness, weakness.  PSYCHIATRY: No anxiety or depression.   DRUG ALLERGIES:   Allergies  Allergen Reactions  . Exforge [Amlodipine Besylate-Valsartan] Swelling and Other (See Comments)    Pt states that he is unable to swallow.    . Lisinopril Swelling and Other (See Comments)    Pt states that he is unable to swallow.    . Niacin And Related Swelling and Other (See Comments)    Pt states that he is unable to swallow.      VITALS:  Blood pressure 152/63, pulse 58, temperature 98.5 F (36.9 C), temperature source Oral, resp. rate 18, height 5\' 5"  (1.651 m), weight 76.34 kg (168 lb 4.8 oz), SpO2 98 %.  PHYSICAL EXAMINATION:  GENERAL:  81 y.o.-year-old patient lying in the bed with no acute distress.  EYES: Pupils equal, round, reactive to light and accommodation. No scleral icterus. Extraocular muscles intact.  HEENT: Head atraumatic, normocephalic. Oropharynx and nasopharynx clear.  NECK:  Supple, no jugular venous distention. No thyroid enlargement, no tenderness.  LUNGS: Normal  breath sounds bilaterally, no wheezing, rales,rhonchi or crepitation. No use of accessory muscles of respiration.  CARDIOVASCULAR: S1, S2 normal. No murmurs, rubs, or gallops.  ABDOMEN: Soft, left lower quadrant tender, no rebound tenderness, distended. Bowel sounds present. No organomegaly or mass.  EXTREMITIES: No pedal edema, cyanosis, or clubbing.  NEUROLOGIC: Cranial nerves II through XII are intact. Muscle strength 5/5 in all extremities. Sensation intact. Gait not checked.  PSYCHIATRIC: The patient is alert and oriented x 3.  SKIN: No obvious rash, lesion, or ulcer.    LABORATORY PANEL:   CBC  Recent Labs Lab 02/22/15 0248 02/22/15 0830  WBC 8.5  --   HGB 7.7* 7.3*  HCT 24.3*  --   PLT 159  --    ------------------------------------------------------------------------------------------------------------------  Chemistries   Recent Labs Lab 02/21/15 1529 02/22/15 0248  NA 140 141  K 3.7 4.1  CL 107 110  CO2 25 24  GLUCOSE 125* 146*  BUN 30* 30*  CREATININE 1.56* 1.44*  CALCIUM 8.7* 8.2*  AST 27  --   ALT 14*  --   ALKPHOS 72  --   BILITOT 0.7  --    ------------------------------------------------------------------------------------------------------------------  Cardiac Enzymes No results for input(s): TROPONINI in the last 168 hours. ------------------------------------------------------------------------------------------------------------------  RADIOLOGY:  Nm Gi Blood Loss  02/22/2015   CLINICAL DATA:  Hematochezia, last bleeding at 0200 hours, history type II diabetes mellitus, essential benign hypertension, coronary artery disease post MI  EXAM: NUCLEAR MEDICINE GASTROINTESTINAL BLEEDING SCAN  TECHNIQUE: Sequential abdominal images were obtained following intravenous administration of Tc-24m labeled red blood cells. Imaging was  performed for 2 hours.  RADIOPHARMACEUTICALS:  21.58 mCi Tc-18m in-vitro labeled autologous red cells.  COMPARISON:  07/06/2013   FINDINGS: Normal blood pool distribution of tracer.  Small amount of delabeled tracer within urinary bladder.  No abnormal gastrointestinal localization of tracer identified to suggest active GI bleeding.  IMPRESSION: Negative GI bleeding scan.   Electronically Signed   By: Ulyses Southward M.D.   On: 02/22/2015 14:17    EKG:   Orders placed or performed during the hospital encounter of 02/21/15  . EKG 12-Lead  . EKG 12-Lead    ASSESSMENT AND PLAN:  81 year old male with past medical history of diabetes, hypertension, hyperlipidemia, BPH, history of diverticulosis, who presented to the hospital due to multiple episodes of rectal bleeding.   #1 acute GI bleed-this is likely a lower GI bleed given the patient's hematochezia ,  likely a diverticular GI bleed. -Patient's bleeding scan is negative -Follow serial hemoglobin. Hold aspirin, Eliquis.  -Pending gastroenterology consult. -Patient had transfusion of 1 unit of packed red blood cells, hemoglobin is at 7.7--.7.3 today  #2 history of chronic afibrillation-continue Coreg, hold Eliquis, aspirin.  #3 BPH-continue finasteride.  #4 diabetes type 2 without complication-hold metformin, glipizide, scheduled insulin.  -Continue sliding scale insulin.  #5 history of hypertension-continue Coreg. I will hold other antihypertensives given his acute GI bleed presently.     All the records are reviewed and case discussed with Care Management/Social Workerr. Management plans discussed with the patient, family and they are in agreement.  CODE STATUS: Full code  TOTAL TIME TAKING CARE OF THIS PATIENT: 35 minutes.   POSSIBLE D/C IN  2-3AYS, DEPENDING ON CLINICAL CONDITION.   Ramonita Lab M.D on 02/22/2015 at 2:28 PM  Between 7am to 6pm - Pager - [phone] After 6pm go to [email or web address] - password [email or web address]  [place] [facility]  Office  [phone]  CC: Primary care physician; Lauro Regulus., MD

## 2015-02-22 NOTE — Progress Notes (Signed)
Spoke with Dr Clint Guy, bleeding scan for Saturday

## 2015-02-22 NOTE — Consult Note (Signed)
GI Inpatient Consult Note  Reason for Consult: hematochezia, anemia.    Attending Requesting Consult: Sainani  History of Present Illness: [name] is a 81 y.o. male with past medical history notable for coronary artery disease, lower GI bleed DM 2 presenting for evaluation of rectal bleeding.  The bleeding started on the morning of the 26th. Up to 6 or 7 episodes and then presented to the emergency room. He had several more episodes here in the hospital. The blood is bright red and maroon in color. Not had any further bleeding since 2 AM this morning. [name] really does endorse a previous history of lower GI bleed which she reports was thought due to diverticulosis. He does report a colonoscopy several years ago and is not interested in pursuing any further colonoscopies.  Here in the hospital he did have a large drop in his hemoglobin to 7. It is now stable at 7. He went for a tagged red blood cell scan this afternoon with the results still pending.  He currently feels well. He does not have abdominal pain, nausea, vomiting, chest pain, or shortness of breath.  Reviewing the records, he did have a colonoscopy 1 year ago and on that study he had only sigmoid and descending diverticulosis.  Past Medical History:  Past Medical History  Diagnosis Date  . Pure hypercholesterolemia   . Type II or unspecified type diabetes mellitus without mention of complication, uncontrolled   . Hypersomnia with sleep apnea, unspecified   . Other and unspecified hyperlipidemia   . Essential hypertension, benign   . Coronary artery disease   . BPH (benign prostatic hypertrophy)   . Diverticulosis   . MI (myocardial infarction)   . GI bleed 2015    Problem List: Patient Active Problem List   Diagnosis Date Noted  . GI bleed 02/21/2015  . Bradycardia 06/17/2014  . Essential hypertension 12/19/2013  . Coronary artery disease 07/27/2013  . Status post coronary angioplasty 07/27/2013  . Diabetes  mellitus type 2 with complications 07/27/2013  . Atrial fibrillation 07/27/2013  . Obesity 07/27/2013  . Hyperlipidemia 07/27/2013  . Chronic diastolic CHF (congestive heart failure) 07/27/2013  . Lower GI bleed 07/27/2013    Past Surgical History: Past Surgical History  Procedure Laterality Date  . Tonsillectomy    . Inguinal hernia repair      left inguinal   . Cholecystectomy  1994  . Flexible sigmoidoscopy    . Cardiac catheterization    . Coronary angioplasty  1996    [place]    Allergies: Allergies  Allergen Reactions  . Exforge [Amlodipine Besylate-Valsartan] Swelling and Other (See Comments)    Pt states that he is unable to swallow.    . Lisinopril Swelling and Other (See Comments)    Pt states that he is unable to swallow.    . Niacin And Related Swelling and Other (See Comments)    Pt states that he is unable to swallow.      Home Medications: Prescriptions prior to admission  Medication Sig Dispense Refill Last Dose  . apixaban (ELIQUIS) 2.5 MG TABS tablet Take 1 tablet (2.5 mg total) by mouth 2 (two) times daily. 180 tablet 3 02/20/2015 at Unknown time  . aspirin EC 81 MG tablet Take 81 mg by mouth daily.   02/20/2015 at Unknown time  . carvedilol (COREG) 6.25 MG tablet Take 1 tablet (6.25 mg total) by mouth 2 (two) times daily with a meal. 180 tablet 3 02/21/2015 at 0730  .  Coenzyme Q10 (CO Q 10) 100 MG CAPS Take 100 mg by mouth daily.    02/20/2015 at Unknown time  . finasteride (PROSCAR) 5 MG tablet Take 5 mg by mouth at bedtime.   02/20/2015 at Unknown time  . furosemide (LASIX) 40 MG tablet Take 40-80 mg by mouth 2 (two) times daily. Pt takes two in the morning and one in the evening.   02/21/2015 at Unknown time  . glipiZIDE (GLUCOTROL) 10 MG tablet Take 10 mg by mouth at bedtime.    02/20/2015 at Unknown time  . insulin glargine (LANTUS) 100 UNIT/ML injection Inject 12 Units into the skin daily.   02/21/2015 at Unknown time  . losartan (COZAAR) 50 MG tablet Take  1 tablet (50 mg total) by mouth daily. 90 tablet 3 02/21/2015 at Unknown time  . Magnesium 200 MG TABS Take 200 mg by mouth daily.    02/20/2015 at Unknown time  . metolazone (ZAROXOLYN) 5 MG tablet Take 1 tablet (5 mg total) by mouth daily as needed. (Patient taking differently: Take 5 mg by mouth daily as needed (for edema). ) 30 tablet 1 Past Month at Unknown time  . potassium chloride 20 MEQ TBCR Take 20 mEq by mouth daily. (Patient taking differently: Take 20-40 mEq by mouth daily. Pt takes two tablets on Monday, Wednesday, and Friday and takes one tablet all other days.) 30 tablet 6 02/21/2015 at Unknown time  . sitaGLIPtin-metformin (JANUMET) 50-1000 MG per tablet Take 1 tablet by mouth 2 (two) times daily with a meal.   02/21/2015 at Unknown time   Home medication reconciliation was completed with the patient.   Scheduled Inpatient Medications:   . carvedilol  6.25 mg Oral BID WC  . finasteride  5 mg Oral QHS  . [START ON 02/23/2015] Influenza vac split quadrivalent PF  0.5 mL Intramuscular Tomorrow-1000  . insulin aspart  0-5 Units Subcutaneous QHS  . insulin aspart  0-9 Units Subcutaneous TID WC  . magnesium oxide  200 mg Oral Daily  . potassium chloride SA  20 mEq Oral Daily    Continuous Inpatient Infusions:   . sodium chloride 100 mL/hr at 02/22/15 1102    PRN Inpatient Medications:  acetaminophen **OR** acetaminophen, ondansetron **OR** ondansetron (ZOFRAN) IV  Family History: family history includes Alzheimer's disease in his father; Rheum arthritis in his mother.  The patient's family history is negative for inflammatory bowel disorders, GI malignancy, or solid organ transplantation.  Social History:   reports that he has quit smoking. His smoking use included Cigarettes. He has a 50 pack-year smoking history. He does not have any smokeless tobacco history on file. He reports that he does not drink alcohol or use illicit drugs.    Review of Systems: Constitutional: Weight  is stable.  Eyes: No changes in vision. ENT: No oral lesions, sore throat.  GI: see HPI.  Heme/Lymph: No easy bruising.  CV: No chest pain.  GU: No hematuria.  Integumentary: No rashes.  Neuro: No headaches.  Psych: No depression/anxiety.  Endocrine: No heat/cold intolerance.  Allergic/Immunologic: No urticaria.  Resp: No cough, SOB.  Musculoskeletal: No joint swelling.    Physical Examination: BP 152/63 mmHg  Pulse 58  Temp(Src) 98.5 F (36.9 C) (Oral)  Resp 18  Ht  (1.651 m)  Wt 76.34 kg (168 lb 4.8 oz)  BMI 28.01 kg/m2  SpO2 98% Gen: NAD, alert and oriented x 4 HEENT: PEERLA, EOMI, Neck: supple, no JVD or thyromegaly Chest: CTA bilaterally, no wheezes,  crackles, or other adventitious sounds CV: RRR, no m/g/c/r Abd: soft, NT, ND, +BS in all four quadrants; no HSM, guarding, ridigity, or rebound tenderness Ext: no edema, well perfused with 2+ pulses, Skin: no rash or lesions noted Lymph: no LAD  Data: Lab Results  Component Value Date   WBC 8.5 02/22/2015   HGB 7.2* 02/22/2015   HCT 24.3* 02/22/2015   MCV 76.7* 02/22/2015   PLT 159 02/22/2015    Recent Labs Lab 02/22/15 0248 02/22/15 0830 02/22/15 1443  HGB 7.7* 7.3* 7.2*   Lab Results  Component Value Date   NA 141 02/22/2015   K 4.1 02/22/2015   CL 110 02/22/2015   CO2 24 02/22/2015   BUN 30* 02/22/2015   CREATININE 1.44* 02/22/2015   Lab Results  Component Value Date   ALT 14* 02/21/2015   AST 27 02/21/2015   ALKPHOS 72 02/21/2015   BILITOT 0.7 02/21/2015   No results for input(s): APTT, INR, PTT in the last 168 hours.   Assessment/Plan: Mr. Urrego is a 81 y.o. male with hematochezia that has now resolved. He does have a low hemoglobin at this time of 7.2. The colonoscopy 1 year ago and did have documented diverticulosis on that exam. He is not interested in any further colonoscopies and I think this is quite reasonable giving the bleeding has resolved and his age along with, along  with a colonoscopy performed one year ago.  Recommendations: - monitor Hgb until stable - safe for d/c when hgb stable and no further bleeding - start PO iron TID - consider transfusion if becomes symptomatic or further drop in hgb - no plans for colonoscopy.   Thank you for the consult. Please call with questions or concerns.  Oshua Mcconaha, Addison Naegeli, MD

## 2015-02-23 DIAGNOSIS — K5731 Diverticulosis of large intestine without perforation or abscess with bleeding: Secondary | ICD-10-CM | POA: Diagnosis not present

## 2015-02-23 LAB — GLUCOSE, CAPILLARY
GLUCOSE-CAPILLARY: 241 mg/dL — AB (ref 65–99)
Glucose-Capillary: 149 mg/dL — ABNORMAL HIGH (ref 65–99)
Glucose-Capillary: 149 mg/dL — ABNORMAL HIGH (ref 65–99)

## 2015-02-23 LAB — CBC
HCT: 21.8 % — ABNORMAL LOW (ref 40.0–52.0)
Hemoglobin: 6.9 g/dL — ABNORMAL LOW (ref 13.0–18.0)
MCH: 24.3 pg — ABNORMAL LOW (ref 26.0–34.0)
MCHC: 31.5 g/dL — ABNORMAL LOW (ref 32.0–36.0)
MCV: 77 fL — ABNORMAL LOW (ref 80.0–100.0)
PLATELETS: 165 10*3/uL (ref 150–440)
RBC: 2.83 MIL/uL — AB (ref 4.40–5.90)
RDW: 21.7 % — AB (ref 11.5–14.5)
WBC: 8 10*3/uL (ref 3.8–10.6)

## 2015-02-23 LAB — BASIC METABOLIC PANEL
ANION GAP: 6 (ref 5–15)
BUN: 27 mg/dL — AB (ref 6–20)
CO2: 23 mmol/L (ref 22–32)
Calcium: 8.1 mg/dL — ABNORMAL LOW (ref 8.9–10.3)
Chloride: 110 mmol/L (ref 101–111)
Creatinine, Ser: 1.49 mg/dL — ABNORMAL HIGH (ref 0.61–1.24)
GFR, EST AFRICAN AMERICAN: 48 mL/min — AB (ref >60)
GFR, EST NON AFRICAN AMERICAN: 41 mL/min — AB (ref >60)
Glucose, Bld: 144 mg/dL — ABNORMAL HIGH (ref 65–99)
POTASSIUM: 4.5 mmol/L (ref 3.5–5.1)
SODIUM: 139 mmol/L (ref 135–145)

## 2015-02-23 LAB — PREPARE RBC (CROSSMATCH)

## 2015-02-23 LAB — HEMOGLOBIN AND HEMATOCRIT, BLOOD
HEMATOCRIT: 25.5 % — AB (ref 40.0–52.0)
Hemoglobin: 8.2 g/dL — ABNORMAL LOW (ref 13.0–18.0)

## 2015-02-23 MED ORDER — SODIUM CHLORIDE 0.9 % IV SOLN
Freq: Once | INTRAVENOUS | Status: AC
  Administered 2015-02-23: 09:00:00 via INTRAVENOUS

## 2015-02-23 MED ORDER — FERROUS SULFATE 325 (65 FE) MG PO TABS: 325.0000 mg | ORAL_TABLET | Freq: Three times a day (TID) | ORAL | Status: DC

## 2015-02-23 MED ORDER — DOCUSATE SODIUM 100 MG PO CAPS: 100.0000 mg | ORAL_CAPSULE | Freq: Two times a day (BID) | ORAL | Status: DC

## 2015-02-23 MED ORDER — ACETAMINOPHEN 325 MG PO TABS: 650.0000 mg | ORAL_TABLET | Freq: Four times a day (QID) | ORAL | Status: DC | PRN

## 2015-02-23 NOTE — Progress Notes (Signed)
GI Note:  No further bleeding.  Hgb 6.9 today. Getting 1 U prbc.   Recs: - ok for d/c. - would start just asa 81 and sched f/u with cardiologist regarding starting elaquis, would err on side of re-starting since this was likely diverticular bleed.

## 2015-02-23 NOTE — Discharge Instructions (Signed)
F/u with GI -dr,Rein in 1-2 weeks F/u with [facility] in a week F/u with [facility] - dr.Gollan in 3 days Stop taking eliquis until seen and cleared by dr.Gollan  Activity as tolerated Diet -cardiac, diabetic

## 2015-02-23 NOTE — Care Management Note (Signed)
Case Management Note  Patient Details  Name: [name] MRN: [id number] Date of Birth: 05-20-1928  Subjective/Objective:        Discussed discharge planning with 81yo [name]. Resides at home with his wife. PCP=Dr Anderson. Pharmacy=[facility] on [address] Both he and his wife drive. Performs own ADLs. No home assistive equipment. Denies need for any in home services after this hospital discharge.             Action/Plan:   Expected Discharge Date:                  Expected Discharge Plan:     In-House Referral:     Discharge planning Services     Post Acute Care Choice:    Choice offered to:     DME Arranged:    DME Agency:     HH Arranged:    HH Agency:     Status of Service:     Medicare Important Message Given:    Date Medicare IM Given:    Medicare IM give by:    Date Additional Medicare IM Given:    Additional Medicare Important Message give by:     If discussed at Long Length of Stay Meetings, dates discussed:    Additional Comments:  Mccall Lomax A, RN 02/23/2015, 1:53 PM

## 2015-02-23 NOTE — Progress Notes (Signed)
Pt alert and oriented. Discharge information given, concerns addressed. Prescription given. IV site removed.

## 2015-02-25 LAB — TYPE AND SCREEN
ABO/RH(D): A POS
ANTIBODY SCREEN: NEGATIVE
UNIT DIVISION: 0
UNIT DIVISION: 0
Unit division: 0

## 2015-02-26 ENCOUNTER — Ambulatory Visit (INDEPENDENT_AMBULATORY_CARE_PROVIDER_SITE_OTHER): Payer: Medicare Other | Admitting: Cardiovascular Disease

## 2015-02-26 ENCOUNTER — Encounter: Payer: Self-pay | Admitting: Cardiovascular Disease

## 2015-02-26 VITALS — BP 138/60 | HR 56 | Ht 65.0 in | Wt 179.5 lb

## 2015-02-26 DIAGNOSIS — K922 Gastrointestinal hemorrhage, unspecified: Secondary | ICD-10-CM

## 2015-02-26 DIAGNOSIS — D500 Iron deficiency anemia secondary to blood loss (chronic): Secondary | ICD-10-CM | POA: Diagnosis not present

## 2015-02-26 DIAGNOSIS — I4819 Other persistent atrial fibrillation: Secondary | ICD-10-CM

## 2015-02-26 DIAGNOSIS — I2510 Atherosclerotic heart disease of native coronary artery without angina pectoris: Secondary | ICD-10-CM | POA: Diagnosis not present

## 2015-02-26 DIAGNOSIS — I481 Persistent atrial fibrillation: Secondary | ICD-10-CM | POA: Diagnosis not present

## 2015-02-26 DIAGNOSIS — I5032 Chronic diastolic (congestive) heart failure: Secondary | ICD-10-CM

## 2015-02-26 DIAGNOSIS — E785 Hyperlipidemia, unspecified: Secondary | ICD-10-CM

## 2015-02-26 DIAGNOSIS — Z9861 Coronary angioplasty status: Secondary | ICD-10-CM

## 2015-02-26 DIAGNOSIS — E669 Obesity, unspecified: Secondary | ICD-10-CM

## 2015-02-26 NOTE — Assessment & Plan Note (Signed)
Currently not on a statin given history of myalgias

## 2015-02-26 NOTE — Discharge Summary (Signed)
[facility] [place] [facility]   PATIENT NAME: [name]    MR#:  [id number]  DATE OF BIRTH:  13-Oct-1927  DATE OF ADMISSION:  02/21/2015 ADMITTING PHYSICIAN: Houston Siren, MD  DATE OF DISCHARGE: 02/23/2015  6:58 PM  PRIMARY CARE PHYSICIAN: Lauro Regulus., MD    ADMISSION DIAGNOSIS:  Hematochezia [K92.1] Acute blood loss anemia [D62] Acute lower GI bleeding [K92.2]  DISCHARGE DIAGNOSIS:  Active Problems:   GI bleed   SECONDARY DIAGNOSIS:   Past Medical History  Diagnosis Date  . Pure hypercholesterolemia   . Type II or unspecified type diabetes mellitus without mention of complication, uncontrolled   . Hypersomnia with sleep apnea, unspecified   . Other and unspecified hyperlipidemia   . Essential hypertension, benign   . Coronary artery disease   . BPH (benign prostatic hypertrophy)   . Diverticulosis   . MI (myocardial infarction)   . GI bleed 2015    HOSPITAL COURSE:   81 year old male with past medical history of diabetes, hypertension, hyperlipidemia, BPH, history of diverticulosis, who presented to the hospital due to multiple episodes of rectal bleeding.   #1 acute GI bleed-this is likely a lower GI bleed given the patient's hematochezia , likely a diverticular GI bleed. -Patient's bleeding scan is negative -Followed serial hemoglobins and given PRBC prn . Held aspirin, Eliquis.  -Appreciate gastroenterology consult, recommending OP f/u , rec to resume baby asa and op f/u with cardio regarding Eliquis to be continued or not in future -Patient had transfusion of 2 unit of packed red blood cells, hemoglobin is at 7.7--.7.3-->6.9 --> 8.2 today after transfusion   #2 history of chronic afibrillation-continue Coreg, hold Eliquis, resume 81 mg aspirin.  #3 BPH-continue finasteride.  #4 diabetes type 2 without complication- resumed  metformin, glipizide, scheduled insulin.  - given  sliding scale insulin during  hospital course   #5 history of hypertension-continue Coreg.       DISCHARGE CONDITIONS:   satisfactory  CONSULTS OBTAINED:   Gastroenterology   PROCEDURES none   DRUG ALLERGIES:   Allergies  Allergen Reactions  . Exforge [Amlodipine Besylate-Valsartan] Swelling and Other (See Comments)    Pt states that he is unable to swallow.    . Lisinopril Swelling and Other (See Comments)    Pt states that he is unable to swallow.    . Niacin And Related Swelling and Other (See Comments)    Pt states that he is unable to swallow.      DISCHARGE MEDICATIONS:   Discharge Medication List as of 02/24/2015  4:39 PM    START taking these medications   Details  docusate sodium (COLACE) 100 MG capsule Take 1 capsule (100 mg total) by mouth 2 (two) times daily., Starting 02/23/2015, Until Discontinued, Normal    ferrous sulfate 325 (65 FE) MG tablet Take 1 tablet (325 mg total) by mouth 3 (three) times daily with meals., Starting 02/23/2015, Until Discontinued, Normal    acetaminophen (TYLENOL) 325 MG tablet Take 2 tablets (650 mg total) by mouth every 6 (six) hours as needed for mild pain (or Fever >/= 101)., Starting 02/23/2015, Until Discontinued, OTC      CONTINUE these medications which have NOT CHANGED   Details  aspirin EC 81 MG tablet Take 81 mg by mouth daily., Until Discontinued, Historical Med    carvedilol (COREG) 6.25 MG tablet Take 1 tablet (6.25 mg total) by mouth 2 (two) times daily with a meal., Starting 06/17/2014, Until  Discontinued, Normal    Coenzyme Q10 (CO Q 10) 100 MG CAPS Take 100 mg by mouth daily. , Until Discontinued, Historical Med    finasteride (PROSCAR) 5 MG tablet Take 5 mg by mouth at bedtime., Until Discontinued, Historical Med    furosemide (LASIX) 40 MG tablet Take 40-80 mg by mouth 2 (two) times daily. Pt takes two in the morning and one in the evening., Until Discontinued, Historical Med    glipiZIDE (GLUCOTROL) 10 MG tablet Take 10 mg by mouth at  bedtime. , Until Discontinued, Historical Med    insulin glargine (LANTUS) 100 UNIT/ML injection Inject 12 Units into the skin daily., Until Discontinued, Historical Med    losartan (COZAAR) 50 MG tablet Take 1 tablet (50 mg total) by mouth daily., Starting 12/18/2014, Until Discontinued, Normal    metolazone (ZAROXOLYN) 5 MG tablet Take 1 tablet (5 mg total) by mouth daily as needed., Starting 12/19/2013, Until Discontinued, Normal    sitaGLIPtin-metformin (JANUMET) 50-1000 MG per tablet Take 1 tablet by mouth 2 (two) times daily with a meal., Until Discontinued, Historical Med    Magnesium 200 MG TABS Take 200 mg by mouth daily. , Until Discontinued, Historical Med    potassium chloride 20 MEQ TBCR Take 20 mEq by mouth daily., Starting 06/17/2014, Until Discontinued, Normal      STOP taking these medications     apixaban (ELIQUIS) 2.5 MG TABS tablet          DISCHARGE INSTRUCTIONS:  F/u with pcp in a week  F/u with GI in 2 weeks F/u with cardiology in 3-5 days   DIET:  Healthy heart and diabetic   DISCHARGE CONDITION:  fair  ACTIVITY:  As tolerated  OXYGEN:  Home Oxygen: no   Oxygen Delivery: none  DISCHARGE LOCATION:  home  If you experience worsening of your admission symptoms, develop shortness of breath, life threatening emergency, suicidal or homicidal thoughts you must seek medical attention immediately by calling 911 or calling your MD immediately  if symptoms less severe.  You Must read complete instructions/literature along with all the possible adverse reactions/side effects for all the Medicines you take and that have been prescribed to you. Take any new Medicines after you have completely understood and accpet all the possible adverse reactions/side effects.   Please note  You were cared for by a hospitalist during your hospital stay. If you have any questions about your discharge medications or the care you received while you were in the hospital after  you are discharged, you can call the unit and asked to speak with the hospitalist on call if the hospitalist that took care of you is not available. Once you are discharged, your primary care physician will handle any further medical issues. Please note that NO REFILLS for any discharge medications will be authorized once you are discharged, as it is imperative that you return to your primary care physician (or establish a relationship with a primary care physician if you do not have one) for your aftercare needs so that they can reassess your need for medications and monitor your lab values.     Today  Chief Complaint  Patient presents with  . Rectal Bleeding    Pt is feeling fine, denies any other episodes of bloody stool, abdominal pain , wants to go home after PRBC transfusion   ROS: none CONSTITUTIONAL: Denies fevers, chills. Denies any fatigue, weakness.  EYES: Denies blurry vision, double vision, eye pain. EARS, NOSE, THROAT: Denies tinnitus, ear  pain, hearing loss. RESPIRATORY: Denies cough, wheeze, shortness of breath.  CARDIOVASCULAR: Denies chest pain, palpitations, edema.  GASTROINTESTINAL: Denies nausea, vomiting, diarrhea, abdominal pain. Denies bright red blood per rectum. GENITOURINARY: Denies dysuria, hematuria. ENDOCRINE: Denies nocturia or thyroid problems. HEMATOLOGIC AND LYMPHATIC: Denies easy bruising or bleeding. SKIN: Denies rash or lesion. MUSCULOSKELETAL: Denies pain in neck, back, shoulder, knees, hips or arthritic symptoms.  NEUROLOGIC: Denies paralysis, paresthesias.  PSYCHIATRIC: Denies anxiety or depressive symptoms.   VITAL SIGNS:  Blood pressure 117/54, pulse 64, temperature 98.3 F (36.8 C), temperature source Oral, resp. rate 17, height 5\' 5"  (1.651 m), weight 76.34 kg (168 lb 4.8 oz), SpO2 97 %.  I/O:  No intake or output data in the 24 hours ending 02/26/15 1231  PHYSICAL EXAMINATION:  GENERAL:  81 y.o.-year-old patient lying in the bed with  no acute distress.  EYES: Pupils equal, round, reactive to light and accommodation. No scleral icterus. Extraocular muscles intact.  HEENT: Head atraumatic, normocephalic. Oropharynx and nasopharynx clear.  NECK:  Supple, no jugular venous distention. No thyroid enlargement, no tenderness.  LUNGS: Normal breath sounds bilaterally, no wheezing, rales,rhonchi or crepitation. No use of accessory muscles of respiration.  CARDIOVASCULAR: S1, S2 normal. No murmurs, rubs, or gallops.  ABDOMEN: Soft, non-tender, non-distended. Bowel sounds present. No organomegaly or mass.  EXTREMITIES: No pedal edema, cyanosis, or clubbing.  NEUROLOGIC: Cranial nerves II through XII are intact. Muscle strength 5/5 in all extremities. Sensation intact. Gait not checked.  PSYCHIATRIC: The patient is alert and oriented x 3.  SKIN: No obvious rash, lesion, or ulcer.   DATA REVIEW:   CBC  Recent Labs Lab 02/23/15 0646 02/23/15 1626  WBC 8.0  --   HGB 6.9* 8.2*  HCT 21.8* 25.5*  PLT 165  --     Chemistries   Recent Labs Lab 02/21/15 1529  02/23/15 1626  NA 140  < > 139  K 3.7  < > 4.5  CL 107  < > 110  CO2 25  < > 23  GLUCOSE 125*  < > 144*  BUN 30*  < > 27*  CREATININE 1.56*  < > 1.49*  CALCIUM 8.7*  < > 8.1*  AST 27  --   --   ALT 14*  --   --   ALKPHOS 72  --   --   BILITOT 0.7  --   --   < > = values in this interval not displayed.  Cardiac Enzymes No results for input(s): TROPONINI in the last 168 hours.  Microbiology Results  No results found for this or any previous visit.  RADIOLOGY:  Nm Gi Blood Loss  02/22/2015   CLINICAL DATA:  Hematochezia, last bleeding at 0200 hours, history type II diabetes mellitus, essential benign hypertension, coronary artery disease post MI  EXAM: NUCLEAR MEDICINE GASTROINTESTINAL BLEEDING SCAN  TECHNIQUE: Sequential abdominal images were obtained following intravenous administration of Tc-12m labeled red blood cells. Imaging was performed for 2 hours.   RADIOPHARMACEUTICALS:  21.58 mCi Tc-44m in-vitro labeled autologous red cells.  COMPARISON:  07/06/2013  FINDINGS: Normal blood pool distribution of tracer.  Small amount of delabeled tracer within urinary bladder.  No abnormal gastrointestinal localization of tracer identified to suggest active GI bleeding.  IMPRESSION: Negative GI bleeding scan.   Electronically Signed   By: Ulyses Southward M.D.   On: 02/22/2015 14:17    EKG:   Orders placed or performed during the hospital encounter of 02/21/15  . EKG 12-Lead  .  EKG 12-Lead  . EKG      Management plans discussed with the patient, family and they are in agreement.  CODE STATUS: full code  TOTAL TIME TAKING CARE OF THIS PATIENT: 45  minutes.    @[facility] @  on 02/26/2015 at 12:31 PM  Between 7am to 6pm - Pager - [phone]  After 6pm go to [email or web address] - password [email or web address] [facility]  [facility]  Office  [phone]  CC: Primary care physician; Lauro Regulus., MD

## 2015-02-26 NOTE — Assessment & Plan Note (Signed)
Chronic atrial fibrillation, rate relatively well-controlled Recommended that he stop his aspirin as this provides little benefit with higher bleeding risk. He will continue to hold other anticoagulation for least 2 weeks, repeat CBC at that time. Recommended he stay on his iron We will likely change his anticoagulation to pradaxa 75 mg by mouth twice a day, which has a reversing agent that can be given in the emergency room I plan is to restart anticoagulation once CBC has improved up to hemoglobin at least 10, perhaps in 2 weeks or so

## 2015-02-26 NOTE — Assessment & Plan Note (Signed)
Appears relatively euvolemic. Leg edema from venous insufficiency. Suggested he stay on his metolazone

## 2015-02-26 NOTE — Progress Notes (Signed)
Patient ID: [name], male    DOB: [birth year], 81 y.o.   MRN: [id number]  HPI Comments: [name] is a 81 year old gentleman presents for routine followup of his atrial fibrillation and GI bleeding history of CAD, angioplasty in August 1996. PTCA of a diagonal branch and OM in 1996 He has obstructive sleep apnea, not on CPAP  hospitalization 07/07/2013 for diverticular bleed, atrial fibrillation. He was given antibiotics for diverticulitis. Seen by Dr. Mechele Collin. No colonoscopy was performed. He was not started on anticoagulation by cardiology given his GI bleeding and anemia.  In follow-up today, he reports that last week he developed lower GI bleed, bright red blood per rectum. Several bowel movements on Friday and Saturday .Seem to taper off on the Sunday. Was kept in the hospital for several days, given 2 units of blood for hemoglobin 6.9 . Increased up to 8.2 at the time of discharge . Currently feel stable.   he was taking aspirin and low-dose eliquis 2.5 mill grams twice a day at the time of the bleed Seen by GI, no procedures performed Continued mild fatigue, chronic leg swelling  EKG on today's visit shows atrial fibrillation with ventricular rate 56 bpm  Other past medical history   Previously had problems on Lipitor, muscle ache. Also recently stopped lovastatin. Currently not on any cholesterol medication Previous blood showing creatinine 1.6, BUN 29, hemoglobin A1c 7.6 Carvedilol previously decreased for bradycardia.    dizziness while playing golf 05/16/2013. He felt he was having the flu. Seen by PMD and given prednisone with antibiotics.   Developed blood per rectum in January 2015 . He was anemic in the hospital, hemoglobin in the 9 range   echocardiogram 06/26/2013 showing ejection fraction 45-50%, right ventricular systolic pressure 68 mmHg. He was instructed to increase his torsemide to 40 mg alternating with 20 mg Repeat echocardiogram July 08 2013 showed  right ventricular systolic pressure 49, normal ejection fraction   normal stress test March 2009, normal ejection fraction by echocardiogram March 2009 He had a PTCA of a diagonal branch and OM in 1996  Allergies; 2 Exforge in 2008 with tongue swelling, rash     Allergies  Allergen Reactions  . Exforge [Amlodipine Besylate-Valsartan] Swelling and Other (See Comments)    Pt states that he is unable to swallow.    . Lisinopril Swelling and Other (See Comments)    Pt states that he is unable to swallow.    . Niacin And Related Swelling and Other (See Comments)    Pt states that he is unable to swallow.      Outpatient Encounter Prescriptions as of 02/26/2015  Medication Sig  . aspirin EC 81 MG tablet Take 81 mg by mouth daily.  . carvedilol (COREG) 6.25 MG tablet Take 1 tablet (6.25 mg total) by mouth 2 (two) times daily with a meal.  . Coenzyme Q10 (CO Q 10) 100 MG CAPS Take 100 mg by mouth daily.   Marland Kitchen docusate sodium (COLACE) 100 MG capsule Take 1 capsule (100 mg total) by mouth 2 (two) times daily.  . ferrous sulfate 325 (65 FE) MG tablet Take 1 tablet (325 mg total) by mouth 3 (three) times daily with meals.  . finasteride (PROSCAR) 5 MG tablet Take 5 mg by mouth at bedtime.  . furosemide (LASIX) 40 MG tablet Take 40-80 mg by mouth 2 (two) times daily. Pt takes two in the morning and one in the evening.  Marland Kitchen glipiZIDE (GLUCOTROL) 10 MG tablet Take 10  mg by mouth at bedtime.   . insulin glargine (LANTUS) 100 UNIT/ML injection Inject 12 Units into the skin daily.  Marland Kitchen losartan (COZAAR) 50 MG tablet Take 1 tablet (50 mg total) by mouth daily.  . Magnesium 250 MG TABS Take 250 mg by mouth daily.  . metolazone (ZAROXOLYN) 5 MG tablet Take 1 tablet (5 mg total) by mouth daily as needed. (Patient taking differently: Take 5 mg by mouth daily as needed (for edema). )  . potassium chloride (KLOR-CON) 20 MEQ packet Take 20 mEq by mouth daily.  . sitaGLIPtin-metformin (JANUMET) 50-1000 MG per  tablet Take 1 tablet by mouth 2 (two) times daily with a meal.  . [DISCONTINUED] acetaminophen (TYLENOL) 325 MG tablet Take 2 tablets (650 mg total) by mouth every 6 (six) hours as needed for mild pain (or Fever >/= 101). (Patient not taking: Reported on 02/26/2015)  . [DISCONTINUED] Magnesium 200 MG TABS Take 200 mg by mouth daily.   . [DISCONTINUED] potassium chloride 20 MEQ TBCR Take 20 mEq by mouth daily. (Patient not taking: Reported on 02/26/2015)   No facility-administered encounter medications on file as of 02/26/2015.    Past Medical History  Diagnosis Date  . Pure hypercholesterolemia   . Type II or unspecified type diabetes mellitus without mention of complication, uncontrolled   . Hypersomnia with sleep apnea, unspecified   . Other and unspecified hyperlipidemia   . Essential hypertension, benign   . Coronary artery disease   . BPH (benign prostatic hypertrophy)   . Diverticulosis   . MI (myocardial infarction)   . GI bleed 2015    Past Surgical History  Procedure Laterality Date  . Tonsillectomy    . Inguinal hernia repair      left inguinal   . Cholecystectomy  1994  . Flexible sigmoidoscopy    . Cardiac catheterization    . Coronary angioplasty  1996    [place]    Social History  reports that he has quit smoking. His smoking use included Cigarettes. He has a 50 pack-year smoking history. He does not have any smokeless tobacco history on file. He reports that he does not drink alcohol or use illicit drugs.  Family History family history includes Alzheimer's disease in his father; Rheum arthritis in his mother.   Review of Systems  Constitutional: Negative.   Eyes: Negative.   Respiratory: Positive for shortness of breath.   Cardiovascular: Positive for leg swelling.  Gastrointestinal: Negative.   Genitourinary: Negative.   Musculoskeletal: Negative.   Neurological: Negative.   Hematological: Negative.   Psychiatric/Behavioral: Negative.   All other  systems reviewed and are negative.   BP 138/60 mmHg  Pulse 56  Ht 5\' 5"  (1.651 m)  Wt 179 lb 8 oz (81.421 kg)  BMI 29.87 kg/m2  Physical Exam  Constitutional: He is oriented to person, place, and time. He appears well-developed and well-nourished.  HENT:  Head: Normocephalic.  Nose: Nose normal.  Mouth/Throat: Oropharynx is clear and moist.  Eyes: Conjunctivae are normal. Pupils are equal, round, and reactive to light.  Neck: Normal range of motion. Neck supple. No JVD present.  Cardiovascular: Normal rate, regular rhythm, S1 normal, S2 normal, normal heart sounds and intact distal pulses.  Exam reveals no gallop and no friction rub.   No murmur heard. Pulmonary/Chest: Effort normal and breath sounds normal. No respiratory distress. He has no wheezes. He has no rales. He exhibits no tenderness.  Abdominal: Soft. Bowel sounds are normal. He exhibits no distension.  There is no tenderness.  Musculoskeletal: Normal range of motion. He exhibits no edema or tenderness.  Lymphadenopathy:    He has no cervical adenopathy.  Neurological: He is alert and oriented to person, place, and time. Coordination normal.  Skin: Skin is warm and dry. No rash noted. No erythema.  Psychiatric: He has a normal mood and affect. His behavior is normal. Judgment and thought content normal.      Assessment and Plan   Nursing note and vitals reviewed.

## 2015-02-26 NOTE — Assessment & Plan Note (Signed)
On recent hospitalization, it was recommended that he schedule follow-up in 2-3 weeks' time with GI Prior colonoscopy showing diverticuli

## 2015-02-26 NOTE — Assessment & Plan Note (Signed)
Currently with no symptoms of angina. No further testing at this time 

## 2015-02-26 NOTE — Patient Instructions (Signed)
Please take miralex (generic) daily, cap fill  Consider adding citracel  Daily  Stop eliquis and aspirin CBC check in 2 weeks here, We will call with results Stay on iron three times a day  Meet with GI doctor 2 to 3 weeks  We should consider restart pradaxa 75 mg twice a day in 2 to 3 weeks  Please call us if you have new issues that need to be addressed before your next appt.  Your physician wants you to follow-up in: 1 month.

## 2015-02-26 NOTE — Assessment & Plan Note (Signed)
Currently with no symptoms of angina. No further workup at this time. Continue current medication regimen. We'll hold his aspirin given recent GI bleed

## 2015-02-26 NOTE — Assessment & Plan Note (Signed)
We have encouraged continued exercise, careful diet management in an effort to lose weight. 

## 2015-03-12 ENCOUNTER — Other Ambulatory Visit: Payer: Medicare Other

## 2015-03-12 DIAGNOSIS — I4819 Other persistent atrial fibrillation: Secondary | ICD-10-CM

## 2015-03-12 DIAGNOSIS — D500 Iron deficiency anemia secondary to blood loss (chronic): Secondary | ICD-10-CM

## 2015-03-13 LAB — CBC WITH DIFFERENTIAL/PLATELET
BASOS: 0 %
Basophils Absolute: 0 10*3/uL (ref 0.0–0.2)
EOS (ABSOLUTE): 0.3 10*3/uL (ref 0.0–0.4)
EOS: 5 %
HEMOGLOBIN: 9.9 g/dL — AB (ref 12.6–17.7)
Hematocrit: 30.4 % — ABNORMAL LOW (ref 37.5–51.0)
IMMATURE GRANS (ABS): 0 10*3/uL (ref 0.0–0.1)
Immature Granulocytes: 0 %
LYMPHS ABS: 1.7 10*3/uL (ref 0.7–3.1)
LYMPHS: 28 %
MCH: 28.2 pg (ref 26.6–33.0)
MCHC: 32.6 g/dL (ref 31.5–35.7)
MCV: 87 fL (ref 79–97)
MONOCYTES: 12 %
Monocytes Absolute: 0.7 10*3/uL (ref 0.1–0.9)
NEUTROS ABS: 3.3 10*3/uL (ref 1.4–7.0)
Neutrophils: 55 %
PLATELETS: 264 10*3/uL (ref 150–379)
RBC: 3.51 x10E6/uL — ABNORMAL LOW (ref 4.14–5.80)
RDW: 22.1 % — ABNORMAL HIGH (ref 12.3–15.4)
WBC: 6 10*3/uL (ref 3.4–10.8)

## 2015-04-15 ENCOUNTER — Encounter: Payer: Self-pay | Admitting: Cardiovascular Disease

## 2015-04-15 ENCOUNTER — Ambulatory Visit (INDEPENDENT_AMBULATORY_CARE_PROVIDER_SITE_OTHER): Payer: Medicare Other | Admitting: Cardiovascular Disease

## 2015-04-15 VITALS — BP 156/64 | HR 48 | Ht 68.0 in | Wt 178.8 lb

## 2015-04-15 DIAGNOSIS — I2510 Atherosclerotic heart disease of native coronary artery without angina pectoris: Secondary | ICD-10-CM | POA: Diagnosis not present

## 2015-04-15 DIAGNOSIS — E118 Type 2 diabetes mellitus with unspecified complications: Secondary | ICD-10-CM

## 2015-04-15 DIAGNOSIS — I4819 Other persistent atrial fibrillation: Secondary | ICD-10-CM

## 2015-04-15 DIAGNOSIS — I481 Persistent atrial fibrillation: Secondary | ICD-10-CM

## 2015-04-15 DIAGNOSIS — I5032 Chronic diastolic (congestive) heart failure: Secondary | ICD-10-CM | POA: Diagnosis not present

## 2015-04-15 DIAGNOSIS — R001 Bradycardia, unspecified: Secondary | ICD-10-CM

## 2015-04-15 DIAGNOSIS — I10 Essential (primary) hypertension: Secondary | ICD-10-CM | POA: Diagnosis not present

## 2015-04-15 DIAGNOSIS — K922 Gastrointestinal hemorrhage, unspecified: Secondary | ICD-10-CM

## 2015-04-15 MED ORDER — APIXABAN 2.5 MG PO TABS: 2.5000 mg | ORAL_TABLET | Freq: Two times a day (BID) | ORAL | Status: DC

## 2015-04-15 NOTE — Assessment & Plan Note (Signed)
Currently with no symptoms of angina. No further workup at this time. Continue current medication regimen. 

## 2015-04-15 NOTE — Assessment & Plan Note (Signed)
Asymptomatic bradycardia. Recommended he watch his heart rate Suggested we could cut back his Coreg. He prefers to leave it as it is

## 2015-04-15 NOTE — Assessment & Plan Note (Signed)
Chronic atrial fibrillation.ventricular rate is slow. He reports he is asymptomatic

## 2015-04-15 NOTE — Assessment & Plan Note (Signed)
No recent GI bleeding on low-dose anticoagulation

## 2015-04-15 NOTE — Progress Notes (Signed)
Patient ID: [name], male    DOB: Feb 11, 1928, 81 y.o.   MRN: [id number]  HPI Comments: [name] is a 81 year old gentleman presents for routine followup of his atrial fibrillation and GI bleeding history of CAD, angioplasty in August 1996. PTCA of a diagonal branch and OM in 1996 He has obstructive sleep apnea, not on CPAP  hospitalization 07/07/2013 for diverticular bleed, atrial fibrillation. He was given antibiotics for diverticulitis. Seen by Dr. Mechele Collin. No colonoscopy was performed. He was not started on anticoagulation by cardiology given his GI bleeding and anemia.  In follow-up today, he reports that he restarted his eliquis 2.5 mill grams twice a day approximately one month ago Some dark stools presumably from iron pill otherwise feels well Previous hemoglobin 9.9 in September 2016 Denies any other complaints. Legs are getting weaker, not doing regular exercise Plays golf once per week Chronic lower extremity edema, does not bother him Chronic mild fatigue which she attributes to inactivity Currently is not taking a cholesterol medication. Total cholesterol in August 2016 was 120 range  EKG on today's visit shows atrial fibrillation with ventricular rate 52 bpm, nonspecific ST abnormality  Other past medical history  Previously had problems on Lipitor, muscle ache. Also recently stopped lovastatin. Currently not on any cholesterol medication Previous blood showing creatinine 1.6, BUN 29, hemoglobin A1c 7.6 Carvedilol previously decreased for bradycardia.    dizziness while playing golf 05/16/2013. He felt he was having the flu. Seen by PMD and given prednisone with antibiotics.   Developed blood per rectum in January 2015 . He was anemic in the hospital, hemoglobin in the 9 range   echocardiogram 06/26/2013 showing ejection fraction 45-50%, right ventricular systolic pressure 68 mmHg. He was instructed to increase his torsemide to 40 mg alternating with 20 mg Repeat  echocardiogram July 08 2013 showed right ventricular systolic pressure 49, normal ejection fraction   normal stress test March 2009, normal ejection fraction by echocardiogram March 2009 He had a PTCA of a diagonal branch and OM in 1996  Allergies; 2 Exforge in 2008 with tongue swelling, rash     Allergies  Allergen Reactions  . Exforge [Amlodipine Besylate-Valsartan] Swelling and Other (See Comments)    Pt states that he is unable to swallow.    . Lisinopril Swelling and Other (See Comments)    Pt states that he is unable to swallow.    . Niacin And Related Swelling and Other (See Comments)    Pt states that he is unable to swallow.      Outpatient Encounter Prescriptions as of 04/15/2015  Medication Sig  . carvedilol (COREG) 6.25 MG tablet Take 1 tablet (6.25 mg total) by mouth 2 (two) times daily with a meal.  . Coenzyme Q10 (CO Q 10) 100 MG CAPS Take 100 mg by mouth daily.   Marland Kitchen docusate sodium (COLACE) 100 MG capsule Take 1 capsule (100 mg total) by mouth 2 (two) times daily.  . ferrous sulfate 325 (65 FE) MG tablet Take 1 tablet (325 mg total) by mouth 3 (three) times daily with meals.  . finasteride (PROSCAR) 5 MG tablet Take 5 mg by mouth at bedtime.  . furosemide (LASIX) 40 MG tablet Take 40-80 mg by mouth 2 (two) times daily. Pt takes two in the morning and one in the evening.  Marland Kitchen glipiZIDE (GLUCOTROL) 10 MG tablet Take 10 mg by mouth at bedtime.   . insulin glargine (LANTUS) 100 UNIT/ML injection Inject 12 Units into the skin daily.  Marland Kitchen  losartan (COZAAR) 50 MG tablet Take 1 tablet (50 mg total) by mouth daily.  . Magnesium 250 MG TABS Take 250 mg by mouth daily.  . metolazone (ZAROXOLYN) 5 MG tablet Take 1 tablet (5 mg total) by mouth daily as needed. (Patient taking differently: Take 5 mg by mouth daily as needed (for edema). )  . potassium chloride (KLOR-CON) 20 MEQ packet Take 20 mEq by mouth daily.  . sitaGLIPtin-metformin (JANUMET) 50-1000 MG per tablet Take 1 tablet  by mouth 2 (two) times daily with a meal.  . [DISCONTINUED] aspirin EC 81 MG tablet Take 81 mg by mouth daily.  Marland Kitchen apixaban (ELIQUIS) 2.5 MG TABS tablet Take 1 tablet (2.5 mg total) by mouth 2 (two) times daily.   No facility-administered encounter medications on file as of 04/15/2015.    Past Medical History  Diagnosis Date  . Pure hypercholesterolemia   . Type II or unspecified type diabetes mellitus without mention of complication, uncontrolled   . Hypersomnia with sleep apnea, unspecified   . Other and unspecified hyperlipidemia   . Essential hypertension, benign   . Coronary artery disease   . BPH (benign prostatic hypertrophy)   . Diverticulosis   . MI (myocardial infarction) (HCC)   . GI bleed 2015    Past Surgical History  Procedure Laterality Date  . Tonsillectomy    . Inguinal hernia repair      left inguinal   . Cholecystectomy  1994  . Flexible sigmoidoscopy    . Cardiac catheterization    . Coronary angioplasty  1996    [place]    Social History  reports that he has quit smoking. His smoking use included Cigarettes. He has a 50 pack-year smoking history. He does not have any smokeless tobacco history on file. He reports that he does not drink alcohol or use illicit drugs.  Family History family history includes Alzheimer's disease in his father; Rheum arthritis in his mother.   Review of Systems  Constitutional: Negative.   Respiratory: Negative.   Cardiovascular: Positive for leg swelling.  Gastrointestinal: Negative.   Genitourinary: Negative.   Musculoskeletal: Negative.   Neurological: Negative.   Hematological: Negative.   Psychiatric/Behavioral: Negative.   All other systems reviewed and are negative.   BP 156/64 mmHg  Pulse 48  Ht 5\' 8"  (1.727 m)  Wt 178 lb 12 oz (81.08 kg)  BMI 27.18 kg/m2  Physical Exam  Constitutional: He is oriented to person, place, and time. He appears well-developed and well-nourished.  HENT:  Head:  Normocephalic.  Nose: Nose normal.  Mouth/Throat: Oropharynx is clear and moist.  Eyes: Conjunctivae are normal. Pupils are equal, round, and reactive to light.  Neck: Normal range of motion. Neck supple. No JVD present.  Cardiovascular: Normal rate, regular rhythm, S1 normal, S2 normal, normal heart sounds and intact distal pulses.  Exam reveals no gallop and no friction rub.   No murmur heard. Pulmonary/Chest: Effort normal and breath sounds normal. No respiratory distress. He has no wheezes. He has no rales. He exhibits no tenderness.  Abdominal: Soft. Bowel sounds are normal. He exhibits no distension. There is no tenderness.  Musculoskeletal: Normal range of motion. He exhibits no edema or tenderness.  Lymphadenopathy:    He has no cervical adenopathy.  Neurological: He is alert and oriented to person, place, and time. Coordination normal.  Skin: Skin is warm and dry. No rash noted. No erythema.  Psychiatric: He has a normal mood and affect. His behavior is normal.  Judgment and thought content normal.      Assessment and Plan   Nursing note and vitals reviewed.

## 2015-04-15 NOTE — Assessment & Plan Note (Signed)
Blood pressure borderline elevated today. He reports it is approximately 140 at home on a regular basis No medication changes made

## 2015-04-15 NOTE — Assessment & Plan Note (Signed)
We have encouraged continued exercise, careful diet management in an effort to lose weight. 

## 2015-04-15 NOTE — Assessment & Plan Note (Signed)
No desire to start a regular exercise program. Recommended diet restriction

## 2015-04-15 NOTE — Assessment & Plan Note (Signed)
He takes Lasix 40 mg twice a day. Stable renal function, creatinine 1.4 Leg edema likely from venous insufficiency

## 2015-04-15 NOTE — Patient Instructions (Addendum)
You are doing well. No medication changes were made.  Please call us if you have new issues that need to be addressed before your next appt.  Your physician wants you to follow-up in: 6 months.  You will receive a reminder letter in the mail two months in advance. If you don't receive a letter, please call our office to schedule the follow-up appointment.   

## 2015-06-16 ENCOUNTER — Other Ambulatory Visit: Payer: Self-pay | Admitting: Cardiovascular Disease

## 2015-07-05 ENCOUNTER — Other Ambulatory Visit: Payer: Self-pay | Admitting: Cardiovascular Disease

## 2015-07-07 NOTE — Telephone Encounter (Signed)
Requested Prescriptions   Pending Prescriptions Disp Refills  . carvedilol (COREG) 6.25 MG tablet [Pharmacy Med Name: CARVEDILOL 6.25 MG TABLET] 180 tablet 3    Sig: TAKE 1 TABLET (6.25 MG TOTAL) BY MOUTH 2 (TWO) TIMES DAILY WITH A MEAL.

## 2015-08-06 ENCOUNTER — Telehealth: Payer: Self-pay | Admitting: *Deleted

## 2015-08-06 ENCOUNTER — Other Ambulatory Visit: Payer: Self-pay | Admitting: *Deleted

## 2015-08-06 MED ORDER — APIXABAN 2.5 MG PO TABS: ORAL_TABLET | ORAL | Status: DC

## 2015-08-06 NOTE — Telephone Encounter (Signed)
Requested Prescriptions   Signed Prescriptions Disp Refills  . apixaban (ELIQUIS) 2.5 MG TABS tablet 180 tablet 3    Sig: TAKE 1 BY MOUTH TWICE DAILY    Authorizing Provider: Antonieta Iba    Ordering User: Kendrick Fries

## 2015-08-06 NOTE — Telephone Encounter (Signed)
°*  STAT* If patient is at the pharmacy, call can be transferred to refill team.   1. Which medications need to be refilled? (please list name of each medication and dose if known) Eliquis 2.5 mg 2x a day   2. Which pharmacy/location (including street and city if local pharmacy) is medication to be sent to? [facility] fax [phone] phone [phone]  3. Do they need a 30 day or 90 day supply? 90 day

## 2015-09-23 DIAGNOSIS — M1A00X0 Idiopathic chronic gout, unspecified site, without tophus (tophi): Secondary | ICD-10-CM | POA: Insufficient documentation

## 2015-09-30 ENCOUNTER — Ambulatory Visit (INDEPENDENT_AMBULATORY_CARE_PROVIDER_SITE_OTHER): Payer: Medicare Other | Admitting: Cardiovascular Disease

## 2015-09-30 ENCOUNTER — Encounter: Payer: Self-pay | Admitting: Cardiovascular Disease

## 2015-09-30 VITALS — BP 146/68 | HR 53 | Ht 65.0 in | Wt 182.0 lb

## 2015-09-30 DIAGNOSIS — I2510 Atherosclerotic heart disease of native coronary artery without angina pectoris: Secondary | ICD-10-CM

## 2015-09-30 DIAGNOSIS — I5032 Chronic diastolic (congestive) heart failure: Secondary | ICD-10-CM

## 2015-09-30 DIAGNOSIS — I10 Essential (primary) hypertension: Secondary | ICD-10-CM | POA: Diagnosis not present

## 2015-09-30 DIAGNOSIS — I4819 Other persistent atrial fibrillation: Secondary | ICD-10-CM

## 2015-09-30 DIAGNOSIS — E118 Type 2 diabetes mellitus with unspecified complications: Secondary | ICD-10-CM

## 2015-09-30 DIAGNOSIS — I481 Persistent atrial fibrillation: Secondary | ICD-10-CM | POA: Diagnosis not present

## 2015-09-30 NOTE — Progress Notes (Signed)
Patient ID: [name], male    DOB: 09/02/1927, 82 y.o.   MRN: [id number]  HPI Comments: [name] is a 82 year old gentleman presents for routine followup of his atrial fibrillation and GI bleeding Baseline creatinine 1.5 history of CAD, angioplasty in August 1996. PTCA of a diagonal branch and OM in 1996 He has obstructive sleep apnea, not on CPAP  hospitalization 07/07/2013 for diverticular bleed, atrial fibrillation. He was given antibiotics for diverticulitis. Seen by Dr. Mechele Collin. No colonoscopy was performed. He was not started on anticoagulation by cardiology given his GI bleeding and anemia. Stable on eliquis 2.5 mill grams twice a day   In follow-up, he reports having episodes of gout first in his right wrist, now in his right large toe Treated by primary care with colchicine, pain medication Reports some fatigue on the pain medication, taking this 3 times per day, has not felt like doing anything Chronic lower extremity edema, typically wears compression hose, not wearing these today Denies any worsening of his leg edema Takes diuretics on a regular basis including Lasix, occasionally metolazone Diet has been poor, eating lots of Pringles Stopped his iron pill, hematocrit down from 38 to 34  Currently not taking cholesterol medication, total cholesterol recently 139, LDL 84, hemoglobin A1c 7.5  Other past medical history  Previously had problems on Lipitor, muscle ache. Also recently stopped lovastatin. Currently not on any cholesterol medication Previous blood showing creatinine 1.6, BUN 29, hemoglobin A1c 7.6 Carvedilol previously decreased for bradycardia.    dizziness while playing golf 05/16/2013. He felt he was having the flu. Seen by PMD and given prednisone with antibiotics.   Developed blood per rectum in January 2015 . He was anemic in the hospital, hemoglobin in the 9 range   echocardiogram 06/26/2013 showing ejection fraction 45-50%, right ventricular  systolic pressure 68 mmHg. He was instructed to increase his torsemide to 40 mg alternating with 20 mg Repeat echocardiogram July 08 2013 showed right ventricular systolic pressure 49, normal ejection fraction   normal stress test March 2009, normal ejection fraction by echocardiogram March 2009 He had a PTCA of a diagonal branch and OM in 1996  Allergies; 2 Exforge in 2008 with tongue swelling, rash     Allergies  Allergen Reactions  . Exforge [Amlodipine Besylate-Valsartan] Swelling and Other (See Comments)    Pt states that he is unable to swallow.    . Lisinopril Swelling and Other (See Comments)    Pt states that he is unable to swallow.    . Niacin And Related Swelling and Other (See Comments)    Pt states that he is unable to swallow.      Outpatient Encounter Prescriptions as of 09/30/2015  Medication Sig  . apixaban (ELIQUIS) 2.5 MG TABS tablet TAKE 1 BY MOUTH TWICE DAILY  . carvedilol (COREG) 6.25 MG tablet TAKE 1 TABLET (6.25 MG TOTAL) BY MOUTH 2 (TWO) TIMES DAILY WITH A MEAL.  Marland Kitchen Coenzyme Q10 (CO Q 10) 100 MG CAPS Take 100 mg by mouth daily.   . colchicine-probenecid 0.5-500 MG tablet Take 1 tablet by mouth daily.   . finasteride (PROSCAR) 5 MG tablet Take 5 mg by mouth at bedtime.  . furosemide (LASIX) 40 MG tablet Take 40-80 mg by mouth 2 (two) times daily. Pt takes two in the morning and one in the evening.  Marland Kitchen glipiZIDE (GLUCOTROL) 10 MG tablet Take 10 mg by mouth at bedtime.   . insulin glargine (LANTUS) 100 UNIT/ML injection Inject 12 Units  into the skin daily.  Marland Kitchen losartan (COZAAR) 50 MG tablet Take 1 tablet (50 mg total) by mouth daily.  . Magnesium 250 MG TABS Take 250 mg by mouth daily.  . metolazone (ZAROXOLYN) 5 MG tablet Take 1 tablet (5 mg total) by mouth daily as needed. (Patient taking differently: Take 5 mg by mouth daily as needed (for edema). )  . oxyCODONE-acetaminophen (PERCOCET/ROXICET) 5-325 MG tablet Take by mouth every 4 (four) hours as needed for  severe pain.  . potassium chloride (KLOR-CON) 20 MEQ packet Take 20 mEq by mouth daily.  . sitaGLIPtin-metformin (JANUMET) 50-1000 MG per tablet Take 1 tablet by mouth 2 (two) times daily with a meal.  . [DISCONTINUED] docusate sodium (COLACE) 100 MG capsule Take 1 capsule (100 mg total) by mouth 2 (two) times daily. (Patient not taking: Reported on 09/30/2015)  . [DISCONTINUED] ferrous sulfate 325 (65 FE) MG tablet Take 1 tablet (325 mg total) by mouth 3 (three) times daily with meals. (Patient not taking: Reported on 09/30/2015)   No facility-administered encounter medications on file as of 09/30/2015.    Past Medical History  Diagnosis Date  . Pure hypercholesterolemia   . Type II or unspecified type diabetes mellitus without mention of complication, uncontrolled   . Hypersomnia with sleep apnea, unspecified   . Other and unspecified hyperlipidemia   . Essential hypertension, benign   . Coronary artery disease   . BPH (benign prostatic hypertrophy)   . Diverticulosis   . MI (myocardial infarction) (HCC)   . GI bleed 2015    Past Surgical History  Procedure Laterality Date  . Tonsillectomy    . Inguinal hernia repair      left inguinal   . Cholecystectomy  1994  . Flexible sigmoidoscopy    . Cardiac catheterization    . Coronary angioplasty  1996    [place]    Social History  reports that he has quit smoking. His smoking use included Cigarettes. He has a 50 pack-year smoking history. He does not have any smokeless tobacco history on file. He reports that he does not drink alcohol or use illicit drugs.  Family History family history includes Alzheimer's disease in his father; Rheum arthritis in his mother.   Review of Systems  Constitutional: Negative.   Respiratory: Negative.   Cardiovascular: Positive for leg swelling.  Gastrointestinal: Negative.   Genitourinary: Negative.   Musculoskeletal: Positive for arthralgias.  Neurological: Negative.   Hematological:  Negative.   Psychiatric/Behavioral: Negative.   All other systems reviewed and are negative.   BP 146/68 mmHg  Pulse 53  Ht 5\' 5"  (1.651 m)  Wt 182 lb (82.555 kg)  BMI 30.29 kg/m2  Physical Exam  Constitutional: He is oriented to person, place, and time. He appears well-developed and well-nourished.  HENT:  Head: Normocephalic.  Nose: Nose normal.  Mouth/Throat: Oropharynx is clear and moist.  Eyes: Conjunctivae are normal. Pupils are equal, round, and reactive to light.  Neck: Normal range of motion. Neck supple. No JVD present.  Cardiovascular: Normal rate, regular rhythm, S1 normal, S2 normal, normal heart sounds and intact distal pulses.  An irregularly irregular rhythm present. Exam reveals no gallop and no friction rub.   No murmur heard. 1+ pitting leg edema to the mid shins  Pulmonary/Chest: Effort normal and breath sounds normal. No respiratory distress. He has no wheezes. He has no rales. He exhibits no tenderness.  Abdominal: Soft. Bowel sounds are normal. He exhibits no distension. There is no tenderness.  Musculoskeletal: Normal range of motion. He exhibits no edema or tenderness.  Lymphadenopathy:    He has no cervical adenopathy.  Neurological: He is alert and oriented to person, place, and time. Coordination normal.  Skin: Skin is warm and dry. No rash noted. No erythema.  Psychiatric: He has a normal mood and affect. His behavior is normal. Judgment and thought content normal.      Assessment and Plan   Nursing note and vitals reviewed.

## 2015-09-30 NOTE — Assessment & Plan Note (Signed)
Long discussion with him today concerning his anticoagulation He is likely on the appropriate regiment for eliquis, Creatinine 1.5 last year, most recently 1.4, this puts him on the edge of the appropriate dosing This was discussed with him in detail, he prefers to stay on his current dose given history of GI bleeding and waxing waning creatinine

## 2015-09-30 NOTE — Assessment & Plan Note (Signed)
Blood pressure is well controlled on today's visit. No changes made to the medications. 

## 2015-09-30 NOTE — Assessment & Plan Note (Signed)
Currently with no symptoms of angina. No further workup at this time. Continue current medication regimen. 

## 2015-09-30 NOTE — Assessment & Plan Note (Signed)
Recommended he follow a strict diet, currently eating lots of Pringles and junk food

## 2015-09-30 NOTE — Assessment & Plan Note (Signed)
We have encouraged continued exercise, careful diet management in an effort to lose weight.   Total encounter time more than 25 minutes  Greater than 50% was spent in counseling and coordination of care with the patient 

## 2015-09-30 NOTE — Assessment & Plan Note (Signed)
Appears relatively euvolemic, takes Lasix daily, still with leg edema likely from venous insufficiency Recommended compression hose, monitoring his diet Decreasing his salt intake

## 2015-09-30 NOTE — Patient Instructions (Signed)
You are doing well. No medication changes were made.  Please call us if you have new issues that need to be addressed before your next appt.  Your physician wants you to follow-up in: 6 months.  You will receive a reminder letter in the mail two months in advance. If you don't receive a letter, please call our office to schedule the follow-up appointment.   

## 2015-11-22 ENCOUNTER — Telehealth: Payer: Self-pay | Admitting: Cardiology

## 2015-11-22 ENCOUNTER — Inpatient Hospital Stay
Admission: EM | Admit: 2015-11-22 | Discharge: 2015-11-25 | DRG: 378 | Disposition: A | Discharge status: Home / Self Care | Payer: Medicare Other | Attending: Internal Medicine | Admitting: Internal Medicine

## 2015-11-22 ENCOUNTER — Encounter: Payer: Self-pay | Admitting: Emergency Medicine

## 2015-11-22 ENCOUNTER — Emergency Department: Payer: Medicare Other

## 2015-11-22 DIAGNOSIS — N401 Enlarged prostate with lower urinary tract symptoms: Secondary | ICD-10-CM | POA: Diagnosis present

## 2015-11-22 DIAGNOSIS — E876 Hypokalemia: Secondary | ICD-10-CM | POA: Diagnosis present

## 2015-11-22 DIAGNOSIS — I252 Old myocardial infarction: Secondary | ICD-10-CM | POA: Diagnosis not present

## 2015-11-22 DIAGNOSIS — D649 Anemia, unspecified: Secondary | ICD-10-CM

## 2015-11-22 DIAGNOSIS — N179 Acute kidney failure, unspecified: Secondary | ICD-10-CM | POA: Diagnosis present

## 2015-11-22 DIAGNOSIS — K5790 Diverticulosis of intestine, part unspecified, without perforation or abscess without bleeding: Secondary | ICD-10-CM | POA: Diagnosis present

## 2015-11-22 DIAGNOSIS — D500 Iron deficiency anemia secondary to blood loss (chronic): Secondary | ICD-10-CM | POA: Diagnosis present

## 2015-11-22 DIAGNOSIS — R338 Other retention of urine: Secondary | ICD-10-CM | POA: Diagnosis present

## 2015-11-22 DIAGNOSIS — G4730 Sleep apnea, unspecified: Secondary | ICD-10-CM | POA: Diagnosis present

## 2015-11-22 DIAGNOSIS — Z7901 Long term (current) use of anticoagulants: Secondary | ICD-10-CM

## 2015-11-22 DIAGNOSIS — Z794 Long term (current) use of insulin: Secondary | ICD-10-CM | POA: Diagnosis not present

## 2015-11-22 DIAGNOSIS — Z7984 Long term (current) use of oral hypoglycemic drugs: Secondary | ICD-10-CM | POA: Diagnosis not present

## 2015-11-22 DIAGNOSIS — I129 Hypertensive chronic kidney disease with stage 1 through stage 4 chronic kidney disease, or unspecified chronic kidney disease: Secondary | ICD-10-CM | POA: Diagnosis present

## 2015-11-22 DIAGNOSIS — R531 Weakness: Secondary | ICD-10-CM | POA: Diagnosis present

## 2015-11-22 DIAGNOSIS — Z87891 Personal history of nicotine dependence: Secondary | ICD-10-CM | POA: Diagnosis not present

## 2015-11-22 DIAGNOSIS — I2510 Atherosclerotic heart disease of native coronary artery without angina pectoris: Secondary | ICD-10-CM | POA: Diagnosis present

## 2015-11-22 DIAGNOSIS — N183 Chronic kidney disease, stage 3 (moderate): Secondary | ICD-10-CM | POA: Diagnosis present

## 2015-11-22 DIAGNOSIS — E7800 Pure hypercholesterolemia, unspecified: Secondary | ICD-10-CM | POA: Diagnosis present

## 2015-11-22 DIAGNOSIS — I959 Hypotension, unspecified: Secondary | ICD-10-CM | POA: Diagnosis present

## 2015-11-22 DIAGNOSIS — K5520 Angiodysplasia of colon without hemorrhage: Secondary | ICD-10-CM | POA: Diagnosis present

## 2015-11-22 DIAGNOSIS — K921 Melena: Secondary | ICD-10-CM | POA: Diagnosis present

## 2015-11-22 DIAGNOSIS — I248 Other forms of acute ischemic heart disease: Secondary | ICD-10-CM | POA: Diagnosis present

## 2015-11-22 DIAGNOSIS — K2970 Gastritis, unspecified, without bleeding: Secondary | ICD-10-CM | POA: Diagnosis present

## 2015-11-22 DIAGNOSIS — K922 Gastrointestinal hemorrhage, unspecified: Secondary | ICD-10-CM | POA: Diagnosis present

## 2015-11-22 DIAGNOSIS — E1122 Type 2 diabetes mellitus with diabetic chronic kidney disease: Secondary | ICD-10-CM | POA: Diagnosis present

## 2015-11-22 DIAGNOSIS — M109 Gout, unspecified: Secondary | ICD-10-CM | POA: Diagnosis present

## 2015-11-22 DIAGNOSIS — I4891 Unspecified atrial fibrillation: Secondary | ICD-10-CM | POA: Diagnosis present

## 2015-11-22 DIAGNOSIS — K259 Gastric ulcer, unspecified as acute or chronic, without hemorrhage or perforation: Secondary | ICD-10-CM | POA: Diagnosis present

## 2015-11-22 DIAGNOSIS — Z79899 Other long term (current) drug therapy: Secondary | ICD-10-CM

## 2015-11-22 LAB — COMPREHENSIVE METABOLIC PANEL
ALBUMIN: 3 g/dL — AB (ref 3.5–5.0)
ALK PHOS: 63 U/L (ref 38–126)
ALT: 16 U/L — ABNORMAL LOW (ref 17–63)
AST: 31 U/L (ref 15–41)
Anion gap: 18 — ABNORMAL HIGH (ref 5–15)
BILIRUBIN TOTAL: 0.8 mg/dL (ref 0.3–1.2)
BUN: 130 mg/dL — AB (ref 6–20)
CALCIUM: 8.5 mg/dL — AB (ref 8.9–10.3)
CO2: 15 mmol/L — ABNORMAL LOW (ref 22–32)
Chloride: 101 mmol/L (ref 101–111)
Creatinine, Ser: 2.48 mg/dL — ABNORMAL HIGH (ref 0.61–1.24)
GFR calc Af Amer: 25 mL/min — ABNORMAL LOW (ref >60)
GFR calc non Af Amer: 22 mL/min — ABNORMAL LOW (ref >60)
GLUCOSE: 256 mg/dL — AB (ref 65–99)
Potassium: 4.9 mmol/L (ref 3.5–5.1)
Sodium: 134 mmol/L — ABNORMAL LOW (ref 135–145)
TOTAL PROTEIN: 7 g/dL (ref 6.5–8.1)

## 2015-11-22 LAB — CBC
HEMATOCRIT: 13.2 % — AB (ref 40.0–52.0)
HEMOGLOBIN: 4.1 g/dL — AB (ref 13.0–18.0)
MCH: 23.2 pg — ABNORMAL LOW (ref 26.0–34.0)
MCHC: 31 g/dL — AB (ref 32.0–36.0)
MCV: 74.8 fL — AB (ref 80.0–100.0)
Platelets: 355 10*3/uL (ref 150–440)
RBC: 1.76 MIL/uL — ABNORMAL LOW (ref 4.40–5.90)
RDW: 23.4 % — ABNORMAL HIGH (ref 11.5–14.5)
WBC: 8.7 10*3/uL (ref 3.8–10.6)

## 2015-11-22 LAB — PREPARE RBC (CROSSMATCH)

## 2015-11-22 LAB — PROTIME-INR
INR: 1.56
Prothrombin Time: 18.7 seconds — ABNORMAL HIGH (ref 11.4–15.0)

## 2015-11-22 LAB — TROPONIN I: TROPONIN I: 0.04 ng/mL — AB (ref <0.031)

## 2015-11-22 MED ORDER — ONDANSETRON HCL 4 MG/2ML IJ SOLN: 4.0000 mg | Freq: Four times a day (QID) | INTRAMUSCULAR | Status: DC | PRN

## 2015-11-22 MED ORDER — SODIUM CHLORIDE 0.9 % IV SOLN
INTRAVENOUS | Status: DC
  Administered 2015-11-23 (×3): via INTRAVENOUS

## 2015-11-22 MED ORDER — FINASTERIDE 5 MG PO TABS
5.0000 mg | ORAL_TABLET | Freq: Every day | ORAL | Status: DC
  Administered 2015-11-22 – 2015-11-24 (×3): 5 mg via ORAL
  Filled 2015-11-22 (×4): qty 1

## 2015-11-22 MED ORDER — SODIUM CHLORIDE 0.9 % IV SOLN: 10.0000 mL/h | Freq: Once | INTRAVENOUS | Status: AC

## 2015-11-22 MED ORDER — COLCHICINE-PROBENECID 0.5-500 MG PO TABS
1.0000 | ORAL_TABLET | Freq: Every day | ORAL | Status: DC
  Filled 2015-11-22 (×2): qty 1

## 2015-11-22 MED ORDER — SODIUM CHLORIDE 0.9 % IV BOLUS (SEPSIS)
1000.0000 mL | Freq: Once | INTRAVENOUS | Status: AC
  Administered 2015-11-22: 1000 mL via INTRAVENOUS

## 2015-11-22 MED ORDER — ONDANSETRON HCL 4 MG PO TABS: 4.0000 mg | ORAL_TABLET | Freq: Four times a day (QID) | ORAL | Status: DC | PRN

## 2015-11-22 MED ORDER — ACETAMINOPHEN 325 MG PO TABS: 650.0000 mg | ORAL_TABLET | Freq: Four times a day (QID) | ORAL | Status: DC | PRN

## 2015-11-22 MED ORDER — MAGNESIUM 250 MG PO TABS: 250.0000 mg | ORAL_TABLET | Freq: Every day | ORAL | Status: DC

## 2015-11-22 MED ORDER — SODIUM CHLORIDE 0.9 % IV SOLN: INTRAVENOUS | Status: DC

## 2015-11-22 MED ORDER — ACETAMINOPHEN 650 MG RE SUPP: 650.0000 mg | Freq: Four times a day (QID) | RECTAL | Status: DC | PRN

## 2015-11-22 MED ORDER — SODIUM CHLORIDE 0.9 % IV SOLN
8.0000 mg/h | INTRAVENOUS | Status: DC
  Administered 2015-11-22 – 2015-11-24 (×4): 8 mg/h via INTRAVENOUS
  Filled 2015-11-22 (×4): qty 80

## 2015-11-22 MED ORDER — SODIUM CHLORIDE 0.9 % IV SOLN
80.0000 mg | Freq: Once | INTRAVENOUS | Status: AC
  Administered 2015-11-22: 80 mg via INTRAVENOUS
  Filled 2015-11-22: qty 80

## 2015-11-22 MED ORDER — MAGNESIUM OXIDE 400 (241.3 MG) MG PO TABS
200.0000 mg | ORAL_TABLET | Freq: Every day | ORAL | Status: DC
  Administered 2015-11-23 – 2015-11-25 (×3): 200 mg via ORAL
  Filled 2015-11-22 (×3): qty 1

## 2015-11-22 MED ORDER — OXYCODONE-ACETAMINOPHEN 5-325 MG PO TABS: 1.0000 | ORAL_TABLET | ORAL | Status: DC | PRN

## 2015-11-22 MED ORDER — CO Q 10 100 MG PO CAPS
100.0000 mg | ORAL_CAPSULE | Freq: Every day | ORAL | Status: DC
Start: 2015-11-22 — End: 2015-11-22

## 2015-11-22 NOTE — H&P (Signed)
[facility] [place] at [facility]   PATIENT NAME: [name]    MR#:  [id number]  DATE OF BIRTH:  Jul 10, 1927  DATE OF ADMISSION:  11/22/2015  PRIMARY CARE PHYSICIAN: Lauro Regulus., MD   REQUESTING/REFERRING PHYSICIAN: Dr Thomasene Mohair  CHIEF COMPLAINT:  Shortness of breath, black stools, chest tightness  HISTORY OF PRESENT ILLNESS:  [name]  is a 82 y.o. male with a known history of Coronary artery disease, chronic atrial fibrillation on oral anticoagulation, type 2 diabetes comes to the emergency room with increasing generalized weakness fatigue shortness of breath and some chest tightness. Chest tightness resolved. EKG shows no changes. Patient was found to have hemoglobin of 4.1. He's been having black tarry stools last 3 or 4 days. He's had diverticular lower GI bleed in 2016 and underwent bleeding scan which was negative. Patient had 2 units of blood transfusion at that time. Once his hemoglobin was stabilized patient was resumed back on his oral anticoagulation by cardiology as outpatient. He's been on it for last 4 months. Patient has Hemoccult positive. His hemoglobin is 4.1. He's getting an emergent blood transfusion at present and 2 more to follow after that.  PAST MEDICAL HISTORY:   Past Medical History  Diagnosis Date  . Pure hypercholesterolemia   . Type II or unspecified type diabetes mellitus without mention of complication, uncontrolled   . Hypersomnia with sleep apnea, unspecified   . Other and unspecified hyperlipidemia   . Essential hypertension, benign   . Coronary artery disease   . BPH (benign prostatic hypertrophy)   . Diverticulosis   . MI (myocardial infarction) (HCC)   . GI bleed 2015    PAST SURGICAL HISTOIRY:   Past Surgical History  Procedure Laterality Date  . Tonsillectomy    . Inguinal hernia repair      left inguinal   . Cholecystectomy  1994  . Flexible sigmoidoscopy    . Cardiac  catheterization    . Coronary angioplasty  1996    [place]    SOCIAL HISTORY:   Social History  Substance Use Topics  . Smoking status: Former Smoker -- 1.00 packs/day for 50 years    Types: Cigarettes  . Smokeless tobacco: Not on file  . Alcohol Use: No    FAMILY HISTORY:   Family History  Problem Relation Age of Onset  . Rheum arthritis Mother   . Alzheimer's disease Father     DRUG ALLERGIES:   Allergies  Allergen Reactions  . Exforge [Amlodipine Besylate-Valsartan] Swelling and Other (See Comments)    Pt states that he is unable to swallow.    . Lisinopril Swelling and Other (See Comments)    Pt states that he is unable to swallow.    . Niacin And Related Swelling and Other (See Comments)    Pt states that he is unable to swallow.      REVIEW OF SYSTEMS:  Review of Systems  Constitutional: Negative for fever, chills and weight loss.  HENT: Negative for ear discharge, ear pain and nosebleeds.   Eyes: Negative for blurred vision, pain and discharge.  Respiratory: Positive for shortness of breath. Negative for sputum production, wheezing and stridor.   Cardiovascular: Positive for chest pain. Negative for palpitations, orthopnea and PND.  Gastrointestinal: Positive for melena. Negative for nausea, vomiting, abdominal pain and diarrhea.  Genitourinary: Negative for urgency and frequency.  Musculoskeletal: Negative for back pain and joint pain.  Neurological: Negative for sensory change, speech change, focal weakness  and weakness.  Psychiatric/Behavioral: Negative for depression and hallucinations. The patient is not nervous/anxious.   All other systems reviewed and are negative.    MEDICATIONS AT HOME:   Prior to Admission medications   Medication Sig Start Date End Date Taking? Authorizing Provider  apixaban (ELIQUIS) 2.5 MG TABS tablet TAKE 1 BY MOUTH TWICE DAILY 08/06/15   Antonieta Iba, MD  carvedilol (COREG) 6.25 MG tablet TAKE 1 TABLET (6.25 MG TOTAL)  BY MOUTH 2 (TWO) TIMES DAILY WITH A MEAL. 07/07/15   Antonieta Iba, MD  Coenzyme Q10 (CO Q 10) 100 MG CAPS Take 100 mg by mouth daily.     Historical Provider, MD  colchicine-probenecid 0.5-500 MG tablet Take 1 tablet by mouth daily.  09/23/15 09/22/16  Historical Provider, MD  finasteride (PROSCAR) 5 MG tablet Take 5 mg by mouth at bedtime.    Historical Provider, MD  furosemide (LASIX) 40 MG tablet Take 40-80 mg by mouth 2 (two) times daily. Pt takes two in the morning and one in the evening.    Historical Provider, MD  glipiZIDE (GLUCOTROL) 10 MG tablet Take 10 mg by mouth at bedtime.     Historical Provider, MD  insulin glargine (LANTUS) 100 UNIT/ML injection Inject 12 Units into the skin daily.    Historical Provider, MD  losartan (COZAAR) 50 MG tablet Take 1 tablet (50 mg total) by mouth daily. 12/18/14   Antonieta Iba, MD  Magnesium 250 MG TABS Take 250 mg by mouth daily.    Historical Provider, MD  metolazone (ZAROXOLYN) 5 MG tablet Take 1 tablet (5 mg total) by mouth daily as needed. Patient taking differently: Take 5 mg by mouth daily as needed (for edema).  12/19/13   Antonieta Iba, MD  oxyCODONE-acetaminophen (PERCOCET/ROXICET) 5-325 MG tablet Take by mouth every 4 (four) hours as needed for severe pain.    Historical Provider, MD  potassium chloride (KLOR-CON) 20 MEQ packet Take 20 mEq by mouth daily.    Historical Provider, MD  sitaGLIPtin-metformin (JANUMET) 50-1000 MG per tablet Take 1 tablet by mouth 2 (two) times daily with a meal.    Historical Provider, MD      VITAL SIGNS:  Blood pressure 114/54, pulse 75, temperature 97.7 F (36.5 C), temperature source Oral, resp. rate 19, height 5\' 5"  (1.651 m), weight 75.751 kg (167 lb), SpO2 100 %.  PHYSICAL EXAMINATION:  GENERAL:  82 y.o.-year-old patient lying in the bed with no acute distress. Pallor+ EYES: Pupils equal, round, reactive to light and accommodation. No scleral icterus. Extraocular muscles intact.  HEENT: Head  atraumatic, normocephalic. Oropharynx and nasopharynx clear.  NECK:  Supple, no jugular venous distention. No thyroid enlargement, no tenderness.  LUNGS: Normal breath sounds bilaterally, no wheezing, rales,rhonchi or crepitation. No use of accessory muscles of respiration.  CARDIOVASCULAR: S1, S2 normal. No murmurs, rubs, or gallops.  ABDOMEN: Soft, nontender, nondistended. Bowel sounds present. No organomegaly or mass.  EXTREMITIES: No pedal edema, cyanosis, or clubbing.  NEUROLOGIC: Cranial nerves II through XII are intact. Muscle strength 5/5 in all extremities. Sensation intact. Gait not checked.  PSYCHIATRIC: The patient is alert and oriented x 3.  SKIN: No obvious rash, lesion, or ulcer.   LABORATORY PANEL:   CBC  Recent Labs Lab 11/22/15 1245  WBC 8.7  HGB 4.1*  HCT 13.2*  PLT 355   ------------------------------------------------------------------------------------------------------------------  Chemistries   Recent Labs Lab 11/22/15 1245  NA 134*  K 4.9  CL 101  CO2 15*  GLUCOSE 256*  BUN 130*  CREATININE 2.48*  CALCIUM 8.5*  AST 31  ALT 16*  ALKPHOS 63  BILITOT 0.8   ------------------------------------------------------------------------------------------------------------------  Cardiac Enzymes  Recent Labs Lab 11/22/15 1245  TROPONINI 0.04*   ------------------------------------------------------------------------------------------------------------------  RADIOLOGY:  Dg Chest Portable 1 View  11/22/2015  CLINICAL DATA:  Chest pain. EXAM: PORTABLE CHEST 1 VIEW COMPARISON:  July 08, 2013. FINDINGS: Stable cardiomegaly. No pneumothorax or pleural effusion is noted. No acute pulmonary disease is noted. Bony thorax is unremarkable. IMPRESSION: No acute cardiopulmonary abnormality seen. Electronically Signed   By: Lupita Raider, M.D.   On: 11/22/2015 13:16    EKG:  Atrial fibrillation  IMPRESSION AND PLAN:   [name]  is a 82 y.o.  male with a known history of Coronary artery disease, chronic atrial fibrillation on oral anticoagulation, type 2 diabetes comes to the emergency room with increasing generalized weakness fatigue shortness of breath and some chest tightness. Chest tightness resolved. EKG shows no changes. Patient was found to have hemoglobin of 4.1. He's been having black tarry stools last 3 or 4 days.  1. GI bleed appears slow with black tarry stools likely upper -Patient getting emergent blood transfusion. ER physician total 3 units to be transfused -IV Protonix drip. -Dr Zachery Dauer GI on call aware of pt. Pt will need EGD -hold oral anticoagulation -h and H q 6 hourly  2. Acute on chronic renal failure -Suspected due to GI bleed and dehydration -hold lisinopril, coreg  3.Hypotension,relative -hold BP meds  4.Dm-2 SSI Hold insulin and oral meds since pt is on CLD  5.DVT prophylaxis SCD/TEDS  All the records are reviewed and case discussed with ED provider. Management plans discussed with the patient, family and they are in agreement.  CODE STATUS:full TOTAL TIME TAKING CARE OF THIS PATIENT: 50 minutes.    [name] M.D on 11/22/2015 at 2:43 PM  Between 7am to 6pm - Pager - [phone]  After 6pm go to [email or web address] - password [email or web address] [facility]  [facility]  Office  [phone]  CC: Primary care physician; Lauro Regulus., MD

## 2015-11-22 NOTE — Consult Note (Signed)
GI Consultation Note  Referring Provider: Fritzi Mandes, MD Date of Consult: 11/22/2015  HPI: [name] is a 82 y.o. male being seen in consultation at the request of Fritzi Mandes, MD for melena.  [name] is a 82 y.o. man with PMH significant for atrial fibrillation on Eliquis , type II DM, CAD who is now being seen for melena. He reports that he has developed progressive dyspnea on exertion for the past 3-4 weeks, along with dizziness, fatigue, and occasional chest pain with exertion. For the past 3-4 days he has noted increasing black stools.  He denies abdominal pain, n/v, changes in appetite. He has lost 14 pounds recently with the addition of diuretics to his medication regimen. He was also recently treated for gout with a 10 day course of prednisone (as well as colchicine and opioids prn). He denies the use of NSAIDs.   The pt reports episodes of hematochezia in 2015 and 2016.  These were evaluated with a tagged RBC scan, which was negative. He underwent colonoscopy approximately 3 years ago. He also reports one prior EGD several years ago, which he states was normal.  PMH: Past Medical History  Diagnosis Date  . Pure hypercholesterolemia   . Type II or unspecified type diabetes mellitus without mention of complication, uncontrolled   . Hypersomnia with sleep apnea, unspecified   . Other and unspecified hyperlipidemia   . Essential hypertension, benign   . Coronary artery disease   . BPH (benign prostatic hypertrophy)   . Diverticulosis   . MI (myocardial infarction) ([place])   . GI bleed 2015    PSH: Past Surgical History  Procedure Laterality Date  . Tonsillectomy    . Inguinal hernia repair      left inguinal   . Cholecystectomy  1994  . Flexible sigmoidoscopy    . Cardiac catheterization    . Coronary angioplasty  1996    [place]    Family History: Family History  Problem Relation Age of Onset  . Rheum arthritis Mother   . Alzheimer's disease Father      Social History: Social History   Social History  . Marital Status: Married    Spouse Name: N/A  . Number of Children: N/A  . Years of Education: N/A   Occupational History  . Not on file.   Social History Main Topics  . Smoking status: Former Smoker -- 1.00 packs/day for 50 years    Types: Cigarettes  . Smokeless tobacco: Not on file  . Alcohol Use: No  . Drug Use: No  . Sexual Activity: Not on file   Other Topics Concern  . Not on file   Social History Narrative    ROS: The balance of a 12 system review is negative other than as described in the HPI.  SCHEDULED MEDS:    PHYSICAL EXAM: Filed Vitals:   11/22/15 1430 11/22/15 1500  BP: 114/54 109/54  Pulse: 75 77  Temp:    Resp: 19 16    GEN: Alert, oriented x3 in no apparent distress, but ill appearing. HEENT: Oropharynx clear. Anicteric. Pale conjunctavae CV: Nl rate, nl rhythm. No murmurs, rubs or gallops. LUNGS: Clear to auscultation bilaterally. No wheezes, rales or rhonchi. ABD: Bowel sounds present. Abdomen soft, nontender, nondistended. EXT: no edema NEURO: no focal neurologic deficits. RECTAL: deferred. Per ED, dark, heme positive stool.  LABS: CBC Latest Ref Rng 11/22/2015 03/12/2015 02/23/2015  WBC 3.8 - 10.6 K/uL 8.7 6.0 -  Hemoglobin 13.0 - 18.0  g/dL 4.1(LL) - 8.2(L)  Hematocrit 40.0 - 52.0 % 13.2(LL) 30.4(L) 25.5(L)  Platelets 150 - 440 K/uL 355 264 -    BMP Latest Ref Rng 11/22/2015 02/23/2015 02/22/2015  Glucose 65 - 99 mg/dL 256(H) 144(H) 146(H)  BUN 6 - 20 mg/dL 130(H) 27(H) 30(H)  Creatinine 0.61 - 1.24 mg/dL 2.48(H) 1.49(H) 1.44(H)  Sodium 135 - 145 mmol/L 134(L) 139 141  Potassium 3.5 - 5.1 mmol/L 4.9 4.5 4.1  Chloride 101 - 111 mmol/L 101 110 110  CO2 22 - 32 mmol/L 15(L) 23 24  Calcium 8.9 - 10.3 mg/dL 8.5(L) 8.1(L) 8.2(L)    Hepatic Function Latest Ref Rng 11/22/2015 02/21/2015 07/06/2013  Total Protein 6.5 - 8.1 g/dL 7.0 8.1 7.7  Albumin 3.5 - 5.0 g/dL 3.0(L) 3.6 3.2(L)  AST  15 - 41 U/L 31 27 34  ALT 17 - 63 U/L 16(L) 14(L) 22  Alk Phosphatase 38 - 126 U/L 63 72 85  Total Bilirubin 0.3 - 1.2 mg/dL 0.8 0.7 1.0    ASSESSMENT: [name] is a 82 y.o. male with history of atrial fibrillation on chronic anticoagulation, now presenting with melena and severe anemia. I am somewhat concerned about his cardiovascular status given his dizziness and intermittent chest pain in the setting of anemia. He last took Eliquis this morning. I suspect that he has a slowly bleeding upper GI source (explaning long standing dyspnea, fatigue and dizziness), which became more abrupt in the past 3-4 days given the increased black stools. He seems to be tolerating blood transfusions well.  For now I would recommend continuing the PPI gtt, monitoring his hemodynamics closely, and continuing to transfuse.  If he responds appropriately to PRBC transfusion we will plan for EGD tomorrow am. I am hesitant to perform upper endoscopy more emergently (this evening) given his recent Eliquis use and my concerns about his cardiovascular status, his Acute Kidney Injury, etc.. However, if he develops more acute bleeding overnight we will need to re-evaluate the risks/benefits of endoscopy vs. other evaluation such as tagged RBC. The most pertinent measure at this point seems to be PRBC transfusion. With regards to his Eliquis in particular, the half life is likely closer to 18 hours given his current renal function. I have discussed this plan with the pt and he is in agreement.  RECOMMENDATIONS: - NPO - continue protonix infusion at 25m/hr - monitor hemodynamics closely - continue to monitor Hgb/Hct closely overnight - continue to transfuse to at least a goal Hgb of 7, though 8 would be preferable given cardiovascular comorbidities and recent reports of dyspnea and orthostatic changes - will plan for EGD tomorrow am (at least 24 hours off Eliquis) unless hemodynamics warrant other more urgent  intervention  Hutch Rhett L. BDrema Dallas MD, MPH

## 2015-11-22 NOTE — ED Provider Notes (Signed)
[facility] Emergency Department Provider Note  Time seen: 12:47 PM  I have reviewed the triage vital signs and the nursing notes.   HISTORY  Chief Complaint Chest Pain and Melena    HPI [name] is a 82 y.o. male with a past medical history of diabetes, hypertension, hyperlipidemia, BPH, MI, on Eliquis, hx of GI bleed, who presents to the emergency department with symptoms of generalized weakness, intermittent chest pain and shortness of breath. According to the patient for the past 2 weeks he has been experiencing intermittent chest pain and pressure to the center of his chest. He states for the past one week or so he has been feeling extremely fatigued with generalized weakness, become short of breath with minimal exertion, for instance become short of breath when speaking long sentences speaking. Patient does state for the past 3 or 4 days he has noticed dark stool. Denies any chest pain currently. Does state some mild shortness of breath especially when talking.      Past Medical History  Diagnosis Date  . Pure hypercholesterolemia   . Type II or unspecified type diabetes mellitus without mention of complication, uncontrolled   . Hypersomnia with sleep apnea, unspecified   . Other and unspecified hyperlipidemia   . Essential hypertension, benign   . Coronary artery disease   . BPH (benign prostatic hypertrophy)   . Diverticulosis   . MI (myocardial infarction) (HCC)   . GI bleed 2015    Patient Active Problem List   Diagnosis Date Noted  . Morbid obesity (HCC) 04/15/2015  . GI bleed 02/21/2015  . Bradycardia 06/17/2014  . Essential hypertension 12/19/2013  . Coronary artery disease 07/27/2013  . Status post coronary angioplasty 07/27/2013  . Diabetes mellitus type 2 with complications (HCC) 07/27/2013  . Atrial fibrillation (HCC) 07/27/2013  . Obesity 07/27/2013  . Hyperlipidemia 07/27/2013  . Chronic diastolic CHF (congestive heart  failure) (HCC) 07/27/2013  . Lower GI bleed 07/27/2013    Past Surgical History  Procedure Laterality Date  . Tonsillectomy    . Inguinal hernia repair      left inguinal   . Cholecystectomy  1994  . Flexible sigmoidoscopy    . Cardiac catheterization    . Coronary angioplasty  1996    [place]    Current Outpatient Rx  Name  Route  Sig  Dispense  Refill  . apixaban (ELIQUIS) 2.5 MG TABS tablet      TAKE 1 BY MOUTH TWICE DAILY   180 tablet   3   . carvedilol (COREG) 6.25 MG tablet      TAKE 1 TABLET (6.25 MG TOTAL) BY MOUTH 2 (TWO) TIMES DAILY WITH A MEAL.   180 tablet   3   . Coenzyme Q10 (CO Q 10) 100 MG CAPS   Oral   Take 100 mg by mouth daily.          . colchicine-probenecid 0.5-500 MG tablet   Oral   Take 1 tablet by mouth daily.          . finasteride (PROSCAR) 5 MG tablet   Oral   Take 5 mg by mouth at bedtime.         . furosemide (LASIX) 40 MG tablet   Oral   Take 40-80 mg by mouth 2 (two) times daily. Pt takes two in the morning and one in the evening.         Marland Kitchen glipiZIDE (GLUCOTROL) 10 MG tablet  Oral   Take 10 mg by mouth at bedtime.          . insulin glargine (LANTUS) 100 UNIT/ML injection   Subcutaneous   Inject 12 Units into the skin daily.         Marland Kitchen losartan (COZAAR) 50 MG tablet   Oral   Take 1 tablet (50 mg total) by mouth daily.   90 tablet   3   . Magnesium 250 MG TABS   Oral   Take 250 mg by mouth daily.         . metolazone (ZAROXOLYN) 5 MG tablet   Oral   Take 1 tablet (5 mg total) by mouth daily as needed. Patient taking differently: Take 5 mg by mouth daily as needed (for edema).    30 tablet   1   . oxyCODONE-acetaminophen (PERCOCET/ROXICET) 5-325 MG tablet   Oral   Take by mouth every 4 (four) hours as needed for severe pain.         . potassium chloride (KLOR-CON) 20 MEQ packet   Oral   Take 20 mEq by mouth daily.         . sitaGLIPtin-metformin (JANUMET) 50-1000 MG per tablet   Oral    Take 1 tablet by mouth 2 (two) times daily with a meal.           Allergies Exforge; Lisinopril; and Niacin and related  Family History  Problem Relation Age of Onset  . Rheum arthritis Mother   . Alzheimer's disease Father     Social History Social History  Substance Use Topics  . Smoking status: Former Smoker -- 1.00 packs/day for 50 years    Types: Cigarettes  . Smokeless tobacco: None  . Alcohol Use: No    Review of Systems Constitutional: Negative for fever. Cardiovascular: Intermittent mild chest pain/pressure for the past several weeks. Respiratory: Negative for shortness of breath. Gastrointestinal: Negative for abdominal. Negative for nausea, vomiting, diarrhea. Positive for dark stool.  Genitourinary: Negative for dysuria. Musculoskeletal: Negative for back pain. Neurological: Negative for headache 10-point ROS otherwise negative.  ____________________________________________   PHYSICAL EXAM:  VITAL SIGNS: ED Triage Vitals  Enc Vitals Group     BP 11/22/15 1227 103/22 mmHg     Pulse Rate 11/22/15 1227 68     Resp 11/22/15 1227 18     Temp 11/22/15 1227 97.7 F (36.5 C)     Temp Source 11/22/15 1227 Oral     SpO2 11/22/15 1227 100 %     Weight 11/22/15 1227 167 lb (75.751 kg)     Height 11/22/15 1227 5\' 5"  (1.651 m)     Head Cir --      Peak Flow --      Pain Score 11/22/15 1228 9     Pain Loc --      Pain Edu? --      Excl. in GC? --     Constitutional: Alert and oriented. Well appearing and in no distress. Eyes: Normal exam, Pale conjunctiva  ENT   Head: Normocephalic and atraumatic   Mouth/Throat: Mucous membranes are moist. pale mucous membranes.  Cardiovascular: Normal rate, regular rhythm. Respiratory: Normal respiratory effort without tachypnea nor retractions. Breath sounds are clear Gastrointestinal: Soft and nontender. No distention Musculoskeletal: Nontender with normal range of motion in all extremities. Neurologic:   Normal speech and language. No gross focal neurologic deficits Skin:  Skin is warm, dry and intact.  pale in appearance. Psychiatric: Mood and affect  are normal.  ____________________________________________    EKG   EKG reviewed and interpreted by myself Appears to show sinus arrhythmia versus atrial fibrillation, normal axis, nonspecific ST changes. No ST elevations.    Repeat EKG reviewed and interpreted by myself 13:43:00 shows atrial fibrillation at 64 bpm, narrow QRS, normal axis, nonspecific ST changes, no obvious ST elevation. ____________________________________________    RADIOLOGY  Chest x-ray negative  ____________________________________________    INITIAL IMPRESSION / ASSESSMENT AND PLAN / ED COURSE  Pertinent labs & imaging results that were available during my care of the patient were reviewed by me and considered in my medical decision making (see chart for details).patient presents the emergency department with generalized weakness, intermittent chest pains, does appear very pale on exam. Rectal exam shows melena, strongly guaiac positive. Currently awaiting lab results, suspect very low hemoglobin. Given the melena we will start on a Protonix bolus and infusion.  Hemoglobin has resulted in 4.1. I have ordered the patient 2 units of blood, we'll transfuse once available. Patient will need to be admitted for treatment. The rest of the lab work is pending at this time   ----------------------------------------- 1:25 PM on 11/22/2015 -----------------------------------------  Patient at one point became minimally responsive during questioning, this was very brief and then the patient once again became responsive and was answering questions appropriately. Given his extremely low hemoglobin and changes in mental status I ordered 1 unit of her merchant release blood to begin transfusing in the emergency department.  Creatinine slightly elevated 2.48. Troponin slightly  elevated 0.04. During emergency release blood transfusion the patient did state very slight chest tightness. Infusion was stopped and a repeat EKG performed. Patient states he was very slight and he has been experiencing chest discomfort intermittently over the past 2 weeks. At this time I do not believe this is transfusion related, we'll restart the transfuse him at a slower rate and monitor the patient very closely.   ----------------------------------------- 2:12 PM on 11/22/2015 -----------------------------------------  Patient is doing well with a slower infusion, no further complaints. Patient will be admitted to the hospital. I ordered a total of 3 units of blood for the patient including the one unit of emergency release.       CRITICAL CARE Performed by: Minna Antis   Total critical care time: 60 minutes  Critical care time was exclusive of separately billable procedures and treating other patients.  Critical care was necessary to treat or prevent imminent or life-threatening deterioration.  Critical care was time spent personally by me on the following activities: development of treatment plan with patient and/or surrogate as well as nursing, discussions with consultants, evaluation of patient's response to treatment, examination of patient, obtaining history from patient or surrogate, ordering and performing treatments and interventions, ordering and review of laboratory studies, ordering and review of radiographic studies, pulse oximetry and re-evaluation of patient's condition.     ____________________________________________   FINAL CLINICAL IMPRESSION(S) / ED DIAGNOSES  GI bleed Symptomatic anemia   Minna Antis, MD 11/22/15 [phone]

## 2015-11-22 NOTE — ED Notes (Signed)
Pt denies chest pain at this time.

## 2015-11-22 NOTE — Progress Notes (Signed)
PHARMACIST - PHYSICIAN ORDER COMMUNICATION  CONCERNING: P&T Medication Policy on Herbal Medications  DESCRIPTION:  This patient's order for:  Co-Q 10 has been noted.  This product(s) is classified as an "herbal" or natural product. Due to a lack of definitive safety studies or FDA approval, nonstandard manufacturing practices, plus the potential risk of unknown drug-drug interactions while on inpatient medications, the Pharmacy and Therapeutics Committee does not permit the use of "herbal" or natural products of this type within [facility].   ACTION TAKEN: The pharmacy department is unable to verify this order at this time and your patient has been informed of this safety policy. Please reevaluate patient's clinical condition at discharge and address if the herbal or natural product(s) should be resumed at that time.  Clarisa Schools, PharmD Clinical Pharmacist 11/22/2015

## 2015-11-22 NOTE — ED Notes (Addendum)
Blood transfusion stopped at 1725 due to 4 hour window being up. Total of 323.8 mL of blood was infused with 16.2 mL remaining in the bag.

## 2015-11-22 NOTE — ED Notes (Signed)
At bedside during Rectal exam performed by MD. Hemoccult positive during rectal exam

## 2015-11-22 NOTE — ED Notes (Addendum)
Patient given 1 unit of emergent blood. Total mL given: 340 mL Rate: 125 mL/hr  O Positive Unit # [id number]

## 2015-11-22 NOTE — ED Notes (Signed)
Attempted to call report. Morrie Sheldon, RN not available to take report at this time. Left ascom number for her to call me back

## 2015-11-22 NOTE — ED Notes (Signed)
Extremely pale man states chest pressure x 2 days, black stools x 4 days.

## 2015-11-22 NOTE — ED Notes (Signed)
Patient reports that his chest tightness is the same. No change in status since resuming blood transfusion.

## 2015-11-22 NOTE — Telephone Encounter (Signed)
Pt called with complaints of chest discomfort and SOB. I suggested he go to the ED for evaluation.  Corine Shelter PA-C 11/22/2015 10:05 AM

## 2015-11-22 NOTE — ED Notes (Signed)
Patient report to this RN that he is now experiencing chest tightness. Blood transfusion paused and Dr. Lenard Lance informed. Dr. Lenard Lance to bedside to see patient. Per Dr. Lenard Lance slow blood transfusion infusion to 75 mL/hr and repeat EKG.

## 2015-11-23 ENCOUNTER — Inpatient Hospital Stay: Payer: Medicare Other | Admitting: Anesthesiology

## 2015-11-23 ENCOUNTER — Encounter: Payer: Self-pay | Admitting: Anesthesiology

## 2015-11-23 ENCOUNTER — Encounter
Admission: EM | Disposition: A | Discharge status: Home / Self Care | Payer: Self-pay | Source: Home / Self Care | Attending: Internal Medicine

## 2015-11-23 HISTORY — PX: ESOPHAGOGASTRODUODENOSCOPY (EGD) WITH PROPOFOL: SHX5813

## 2015-11-23 LAB — BASIC METABOLIC PANEL
Anion gap: 9 (ref 5–15)
BUN: 121 mg/dL — ABNORMAL HIGH (ref 6–20)
CHLORIDE: 108 mmol/L (ref 101–111)
CO2: 20 mmol/L — AB (ref 22–32)
CREATININE: 2.42 mg/dL — AB (ref 0.61–1.24)
Calcium: 8.4 mg/dL — ABNORMAL LOW (ref 8.9–10.3)
GFR calc non Af Amer: 23 mL/min — ABNORMAL LOW (ref >60)
GFR, EST AFRICAN AMERICAN: 26 mL/min — AB (ref >60)
Glucose, Bld: 132 mg/dL — ABNORMAL HIGH (ref 65–99)
Potassium: 3.8 mmol/L (ref 3.5–5.1)
Sodium: 137 mmol/L (ref 135–145)

## 2015-11-23 LAB — GLUCOSE, CAPILLARY
GLUCOSE-CAPILLARY: 213 mg/dL — AB (ref 65–99)
GLUCOSE-CAPILLARY: 167 mg/dL — AB (ref 65–99)
Glucose-Capillary: 142 mg/dL — ABNORMAL HIGH (ref 65–99)

## 2015-11-23 LAB — HEMOGLOBIN
HEMOGLOBIN: 7.8 g/dL — AB (ref 13.0–18.0)
Hemoglobin: 6.9 g/dL — ABNORMAL LOW (ref 13.0–18.0)
Hemoglobin: 7.9 g/dL — ABNORMAL LOW (ref 13.0–18.0)

## 2015-11-23 LAB — FERRITIN: Ferritin: 11 ng/mL — ABNORMAL LOW (ref 24–336)

## 2015-11-23 LAB — PREPARE RBC (CROSSMATCH)

## 2015-11-23 LAB — VITAMIN B12: Vitamin B-12: 150 pg/mL — ABNORMAL LOW (ref 180–914)

## 2015-11-23 SURGERY — ESOPHAGOGASTRODUODENOSCOPY (EGD) WITH PROPOFOL
Anesthesia: General | Laterality: Left

## 2015-11-23 MED ORDER — LIDOCAINE HCL (CARDIAC) 20 MG/ML IV SOLN
INTRAVENOUS | Status: DC | PRN
  Administered 2015-11-23: 100 mg via INTRAVENOUS

## 2015-11-23 MED ORDER — INSULIN ASPART 100 UNIT/ML ~~LOC~~ SOLN
0.0000 [IU] | Freq: Every day | SUBCUTANEOUS | Status: DC
  Administered 2015-11-24: 2 [IU] via SUBCUTANEOUS
  Filled 2015-11-23: qty 2

## 2015-11-23 MED ORDER — EPHEDRINE SULFATE 50 MG/ML IJ SOLN
INTRAMUSCULAR | Status: DC | PRN
  Administered 2015-11-23: 10 mg via INTRAVENOUS

## 2015-11-23 MED ORDER — SODIUM CHLORIDE 0.9 % IV SOLN: Freq: Once | INTRAVENOUS | Status: DC

## 2015-11-23 MED ORDER — INSULIN ASPART 100 UNIT/ML ~~LOC~~ SOLN
0.0000 [IU] | Freq: Three times a day (TID) | SUBCUTANEOUS | Status: DC
  Administered 2015-11-23: 3 [IU] via SUBCUTANEOUS
  Administered 2015-11-23: 1 [IU] via SUBCUTANEOUS
  Administered 2015-11-24 – 2015-11-25 (×4): 2 [IU] via SUBCUTANEOUS
  Filled 2015-11-23 (×3): qty 2
  Filled 2015-11-23: qty 1
  Filled 2015-11-23: qty 3
  Filled 2015-11-23: qty 2

## 2015-11-23 MED ORDER — CARVEDILOL 6.25 MG PO TABS
6.2500 mg | ORAL_TABLET | Freq: Two times a day (BID) | ORAL | Status: DC
  Administered 2015-11-23 – 2015-11-25 (×2): 6.25 mg via ORAL
  Filled 2015-11-23 (×4): qty 1

## 2015-11-23 MED ORDER — SODIUM CHLORIDE 0.9 % IV SOLN
Freq: Once | INTRAVENOUS | Status: AC
  Administered 2015-11-23: 11:00:00 via INTRAVENOUS

## 2015-11-23 MED ORDER — GLYCOPYRROLATE 0.2 MG/ML IJ SOLN
INTRAMUSCULAR | Status: DC | PRN
  Administered 2015-11-23: 0.2 mg via INTRAVENOUS

## 2015-11-23 MED ORDER — TAMSULOSIN HCL 0.4 MG PO CAPS
0.4000 mg | ORAL_CAPSULE | Freq: Every day | ORAL | Status: DC
  Administered 2015-11-23 – 2015-11-24 (×2): 0.4 mg via ORAL
  Filled 2015-11-23 (×2): qty 1

## 2015-11-23 MED ORDER — PROPOFOL 500 MG/50ML IV EMUL
INTRAVENOUS | Status: DC | PRN
  Administered 2015-11-23: 200 ug/kg/min via INTRAVENOUS

## 2015-11-23 MED ORDER — FUROSEMIDE 10 MG/ML IJ SOLN
40.0000 mg | Freq: Once | INTRAMUSCULAR | Status: AC
  Administered 2015-11-23: 40 mg via INTRAVENOUS
  Filled 2015-11-23: qty 4

## 2015-11-23 MED ORDER — PROPOFOL 10 MG/ML IV BOLUS
INTRAVENOUS | Status: DC | PRN
  Administered 2015-11-23: 30 mg via INTRAVENOUS
  Administered 2015-11-23: 40 mg via INTRAVENOUS

## 2015-11-23 NOTE — Progress Notes (Signed)
PT Cancellation Note  Patient Details Name: [name] MRN: [id number] DOB: 10/08/1927   Cancelled Treatment:    Reason Eval/Treat Not Completed: Medical issues which prohibited therapy Pt has had 2 units of but HGB is still below 7.0.  Pt to have another transfusion this AM, will check back if pt is appropriate.  Malachi Pro, DPT 11/23/2015, 11:00 AM

## 2015-11-23 NOTE — Progress Notes (Signed)
Patient sitting on bedside commode - requesting privacy. Educated on safety, instructed to use phone or call bell when finished and wait for assistance before getting up.

## 2015-11-23 NOTE — Progress Notes (Signed)
Patient back from endo. Dr. Renae Gloss rounding now. Order to give one unit of blood (cancel duplicate order). NS @75  to be held during tranfusion but restart afterwards. One time lasix dose to be given after transfusion.

## 2015-11-23 NOTE — Progress Notes (Signed)
Patient complaining of not being able to void everything. Voiding about 75-100cc but still having urge. Bladder scan shows 350cc. Patient stated he really doesn't want to have any type of catheter, but he has had to take flomax in the past. Dr. Renae Gloss notified - order for 0.4 flomax daily starting now. Will continue to monitor.

## 2015-11-23 NOTE — Progress Notes (Signed)
Patient ID: [name], male   DOB: 1928/03/27, 82 y.o.   MRN: [id number] [employer] PROGRESS NOTE  Bhavik Cabiness WUJ:[id number] DOB: May 16, 1928 DOA: 11/22/2015 PCP: Lauro Regulus., MD  HPI/Subjective: Patient is hungry and asking for food. He has noticed black stools for a few weeks. He has been on eliquis requests for atrial fibrillation. Found to have a hemoglobin of 4.1 on presentation  Objective: Filed Vitals:   11/23/15 1139 11/23/15 1413  BP: 133/65 123/57  Pulse: 63 58  Temp: 97.9 F (36.6 C) 97.6 F (36.4 C)  Resp: 18 15    Filed Weights   11/22/15 1227  Weight: 75.751 kg (167 lb)    ROS: Review of Systems  Constitutional: Negative for fever and chills.  Eyes: Negative for blurred vision.  Respiratory: Negative for cough and shortness of breath.   Cardiovascular: Negative for chest pain.  Gastrointestinal: Positive for melena. Negative for nausea, vomiting, abdominal pain, diarrhea and constipation.  Genitourinary: Negative for dysuria.  Musculoskeletal: Negative for joint pain.  Neurological: Negative for dizziness and headaches.   Exam: Physical Exam  Constitutional: He is oriented to person, place, and time.  HENT:  Nose: No mucosal edema.  Mouth/Throat: No oropharyngeal exudate or posterior oropharyngeal edema.  Eyes: Conjunctivae, EOM and lids are normal. Pupils are equal, round, and reactive to light.  Neck: No JVD present. Carotid bruit is not present. No edema present. No thyroid mass and no thyromegaly present.  Cardiovascular: S1 normal, S2 normal and normal heart sounds.  Exam reveals no gallop.   No murmur heard. Pulses:      Dorsalis pedis pulses are 2+ on the right side, and 2+ on the left side.  Respiratory: No respiratory distress. He has no wheezes. He has no rhonchi. He has no rales.  GI: Soft. Bowel sounds are normal. There is no tenderness.  Musculoskeletal:       Right ankle: He exhibits swelling.       Left ankle: He  exhibits swelling.  Lymphadenopathy:    He has no cervical adenopathy.  Neurological: He is alert and oriented to person, place, and time. No cranial nerve deficit.  Skin: Skin is warm. No rash noted. Nails show no clubbing.  Psychiatric: He has a normal mood and affect.      Data Reviewed: Basic Metabolic Panel:  Recent Labs Lab 11/22/15 1245 11/23/15 0552  NA 134* 137  K 4.9 3.8  CL 101 108  CO2 15* 20*  GLUCOSE 256* 132*  BUN 130* 121*  CREATININE 2.48* 2.42*  CALCIUM 8.5* 8.4*   Liver Function Tests:  Recent Labs Lab 11/22/15 1245  AST 31  ALT 16*  ALKPHOS 63  BILITOT 0.8  PROT 7.0  ALBUMIN 3.0*   CBC:  Recent Labs Lab 11/22/15 1245 11/23/15 0552  WBC 8.7  --   HGB 4.1* 6.9*  HCT 13.2*  --   MCV 74.8*  --   PLT 355  --    Cardiac Enzymes:  Recent Labs Lab 11/22/15 1245  TROPONINI 0.04*    CBG:  Recent Labs Lab 11/23/15 1235  GLUCAP 142*    Studies: Dg Chest Portable 1 View  11/22/2015  CLINICAL DATA:  Chest pain. EXAM: PORTABLE CHEST 1 VIEW COMPARISON:  July 08, 2013. FINDINGS: Stable cardiomegaly. No pneumothorax or pleural effusion is noted. No acute pulmonary disease is noted. Bony thorax is unremarkable. IMPRESSION: No acute cardiopulmonary abnormality seen. Electronically Signed   By: Lupita Raider, M.D.   On:  11/22/2015 13:16    Scheduled Meds: . sodium chloride   Intravenous Once  . carvedilol  6.25 mg Oral BID WC  . finasteride  5 mg Oral QHS  . furosemide  40 mg Intravenous Once  . insulin aspart  0-5 Units Subcutaneous QHS  . insulin aspart  0-9 Units Subcutaneous TID WC  . magnesium oxide  200 mg Oral Daily   Continuous Infusions: . sodium chloride 75 mL/hr at 11/23/15 1411  . pantoprozole (PROTONIX) infusion 8 mg/hr (11/23/15 0218)    Assessment/Plan:  1. Chronic slow GI bleed. Gastric ulcer and angiodysplasia seen on EGD. Eliquis stopped. Will need repeat endoscopy in 6-8 weeks. Continue PPI. Start clear  liquid diet. 2. Acute kidney injury. IV fluid hydration. Hold nephrotoxic medications. 3. Atrial fibrillation history hold Eliquis 4. Type 2 diabetes mellitus on sliding scale 5. BPH on finasteride 6. History of CAD. On Coreg  Code Status:     Code Status Orders        Start     Ordered   11/22/15 1811  Full code   Continuous     11/22/15 1810    Code Status History    Date Active Date Inactive Code Status Order ID Comments User Context   02/21/2015  8:30 PM 02/23/2015  9:59 PM Full Code [id number]  Houston Siren, MD Inpatient     Family Communication: Wife at bedside Disposition Plan: home in the next few days  Consultants:  Gastroenterology  Time spent: 25 minutes  Alford Highland  [employer]

## 2015-11-23 NOTE — Progress Notes (Signed)
Pt's hgb resulted at 6.9 this am. MD Dr. Tobi Bastos notified. No new orders received. RN will continue to monitor. Syliva Overman, RN

## 2015-11-23 NOTE — Anesthesia Preprocedure Evaluation (Signed)
Anesthesia Evaluation  Patient identified by MRN, date of birth, ID band Patient awake    Reviewed: Allergy & Precautions, H&P , NPO status , Patient's Chart, lab work & pertinent test results  History of Anesthesia Complications Negative for: history of anesthetic complications  Airway Mallampati: III  TM Distance: <3 FB Neck ROM: limited    Dental  (+) Poor Dentition, Chipped   Pulmonary shortness of breath and with exertion, former smoker,    Pulmonary exam normal breath sounds clear to auscultation       Cardiovascular Exercise Tolerance: Poor hypertension, + angina + CAD, + Past MI, +CHF and + DOE  Normal cardiovascular examII Rhythm:regular Rate:Normal     Neuro/Psych negative neurological ROS  negative psych ROS   GI/Hepatic negative GI ROS, Neg liver ROS, neg GERD  ,  Endo/Other  diabetes, Type 2, Insulin Dependent  Renal/GU negative Renal ROS  negative genitourinary   Musculoskeletal   Abdominal   Peds  Hematology negative hematology ROS (+)   Anesthesia Other Findings Past Medical History:   Pure hypercholesterolemia                                    Type II or unspecified type diabetes mellitus *              Hypersomnia with sleep apnea, unspecified                    Other and unspecified hyperlipidemia                         Essential hypertension, benign                               Coronary artery disease                                      BPH (benign prostatic hypertrophy)                           Diverticulosis                                               MI (myocardial infarction) (HCC)                             GI bleed                                        2015        Past Surgical History:   TONSILLECTOMY                                                 inguinal hernia repair  Comment:left inguinal    CHOLECYSTECTOMY                                   1994         FLEXIBLE SIGMOIDOSCOPY                                        CARDIAC CATHETERIZATION                                       CORONARY ANGIOPLASTY                             1996           Comment:[place]  BMI    Body Mass Index   27.79 kg/m 2    Patient is NPO appropriate and reports no nausea or vomiting today.     Reproductive/Obstetrics negative OB ROS                             Anesthesia Physical Anesthesia Plan  ASA: IV  Anesthesia Plan: General   Post-op Pain Management:    Induction:   Airway Management Planned:   Additional Equipment:   Intra-op Plan:   Post-operative Plan:   Informed Consent: I have reviewed the patients History and Physical, chart, labs and discussed the procedure including the risks, benefits and alternatives for the proposed anesthesia with the patient or authorized representative who has indicated his/her understanding and acceptance.   Dental Advisory Given  Plan Discussed with: Anesthesiologist, CRNA and Surgeon  Anesthesia Plan Comments: (Patient informed that they are higher risk for complications from anesthesia during this procedure due to their medical history.  Patient voiced understanding. )        Anesthesia Quick Evaluation

## 2015-11-23 NOTE — Transfer of Care (Signed)
Immediate Anesthesia Transfer of Care Note  Patient: [name]  Procedure(s) Performed: Procedure(s): ESOPHAGOGASTRODUODENOSCOPY (EGD) WITH PROPOFOL (Left)  Patient Location: Endoscopy Unit  Anesthesia Type:General  Level of Consciousness: sedated  Airway & Oxygen Therapy: Patient connected to nasal cannula oxygen  Post-op Assessment: Post -op Vital signs reviewed and stable  Post vital signs: stable  Last Vitals:  Filed Vitals:   11/23/15 0904 11/23/15 0925  BP: 148/81 104/55  Pulse: 67 70  Temp: 36.1 C 36.1 C  Resp: 16 12    Last Pain:  Filed Vitals:   11/23/15 0925  PainSc: 0-No pain         Complications: No apparent anesthesia complications

## 2015-11-23 NOTE — Evaluation (Signed)
Physical Therapy Evaluation Patient Details Name: [name] MRN: [id number] DOB: [birth year] Today's Date: 11/23/2015   History of Present Illness  Pt here with GI bleed, arrived with HGB in the 4s.  He has since had 3 units of blood and at the time of eval was feeling much stronger.  Clinical Impression  Pt did well with PT exam and showed good confidence and safety with ambulation and stairs.  He reports he feels a little weak, but ultimately near his baseline and he is eager to get home once the GI bleed is sorted out.  Pt did well and does not require further PT intervention.     Follow Up Recommendations No PT follow up    Equipment Recommendations       Recommendations for Other Services       Precautions / Restrictions Restrictions Weight Bearing Restrictions: No      Mobility  Bed Mobility Overal bed mobility: Independent                Transfers Overall transfer level: Independent                  Ambulation/Gait Ambulation/Gait assistance: Independent Ambulation Distance (Feet): 150 Feet Assistive device: None       General Gait Details: Pt did well with ambulation with no LOBs and no overt safety issues.  He did not have any fatigue with the effort and ultimately has community appropriate speed and reports he is not far from his normal ambulation.  Stairs Stairs: Yes Stairs assistance: Independent Stair Management: One rail Left Number of Stairs: 5 General stair comments: Pt negotiates up/down with step-to strategy, no hesitancy or safety issues.  Wheelchair Mobility    Modified Rankin (Stroke Patients Only)       Balance Overall balance assessment: Independent                                           Pertinent Vitals/Pain Pain Assessment: No/denies pain    Home Living Family/patient expects to be discharged to:: Private residence Living Arrangements: Spouse/significant other Available Help at Discharge:  Family Type of Home: House Home Access: Stairs to enter Entrance Stairs-Rails: Left Entrance Stairs-Number of Steps: 5          Prior Function Level of Independence: Independent         Comments: Pt reports he is regularly out of the house and is able to do all he needs w/o issue     Hand Dominance        Extremity/Trunk Assessment   Upper Extremity Assessment: Overall WFL for tasks assessed           Lower Extremity Assessment: Overall WFL for tasks assessed         Communication   Communication: No difficulties  Cognition Arousal/Alertness: Awake/alert Behavior During Therapy: WFL for tasks assessed/performed Overall Cognitive Status: Within Functional Limits for tasks assessed                      General Comments      Exercises        Assessment/Plan    PT Assessment Patent does not need any further PT services  PT Diagnosis Generalized weakness   PT Problem List    PT Treatment Interventions     PT Goals (Current goals can be found in the Care  Plan section) Acute Rehab PT Goals Patient Stated Goal: go home    Frequency     Barriers to discharge        Co-evaluation               End of Session Equipment Utilized During Treatment: Gait belt Activity Tolerance: Patient tolerated treatment well Patient left: in bed           Time: 4696-2952 PT Time Calculation (min) (ACUTE ONLY): 21 min   Charges:   PT Evaluation $PT Eval Low Complexity: 1 Procedure     PT G CodesMalachi Pro, DPT 11/23/2015, 6:02 PM

## 2015-11-23 NOTE — Anesthesia Postprocedure Evaluation (Signed)
Anesthesia Post Note  Patient: [name]  Procedure(s) Performed: Procedure(s) (LRB): ESOPHAGOGASTRODUODENOSCOPY (EGD) WITH PROPOFOL (Left)  Patient location during evaluation: Endoscopy Anesthesia Type: General Level of consciousness: awake and alert Pain management: pain level controlled Vital Signs Assessment: post-procedure vital signs reviewed and stable Respiratory status: spontaneous breathing, nonlabored ventilation, respiratory function stable and patient connected to nasal cannula oxygen Cardiovascular status: blood pressure returned to baseline and stable Postop Assessment: no signs of nausea or vomiting Anesthetic complications: no    Last Vitals:  Filed Vitals:   11/23/15 1059 11/23/15 1139  BP: 126/58 133/65  Pulse: 59 63  Temp: 36.4 C 36.6 C  Resp: 16 18    Last Pain:  Filed Vitals:   11/23/15 1139  PainSc: 0-No pain                 Cleda Mccreedy [name]

## 2015-11-23 NOTE — Progress Notes (Signed)
Pt stable post EGD on room air.  Report given to [facility], RN.  Pt transported back to room 243.

## 2015-11-23 NOTE — Op Note (Signed)
[facility] Patient Name: [name] Procedure Date: 11/23/2015 8:54 AM MRN: [id number] Account #: [id number] Date of Birth: 01-Jul-1927 Admit Type: Inpatient Age: 82 Room: [facility] ENDO ROOM 3 Gender: Male Note Status: Finalized Procedure:            Upper GI endoscopy Indications:          Melena Providers:            Ramon Dredge L. Zachery Dauer, MD Referring MD:         Marya Amsler. Dareen Piano, MD (Referring MD) Medicines:            Monitored Anesthesia Care Complications:        No immediate complications. Estimated blood loss:                        Minimal. Procedure:            Pre-Anesthesia Assessment:                       - Prior to the procedure, a History and Physical was                        performed, and patient medications, allergies and                        sensitivities were reviewed. The patient's tolerance of                        previous anesthesia was reviewed.                       - The risks and benefits of the procedure and the                        sedation options and risks were discussed with the                        patient. All questions were answered and informed                        consent was obtained.                       - Patient identification and proposed procedure were                        verified prior to the procedure by the physician, the                        nurse, the anesthesiologist and the anesthetist. The                        procedure was verified in the procedure room.                       After obtaining informed consent, the endoscope was                        passed under direct vision. Throughout the procedure,  the patient's blood pressure, pulse, and oxygen                        saturations were monitored continuously. The                        Colonoscope was introduced through the mouth, and                        advanced to the third part of duodenum.  The upper GI                        endoscopy was accomplished without difficulty. The                        patient tolerated the procedure well. Findings:      The examined esophagus was normal.      The Z-line was regular and was found 40 cm from the incisors.      One non-bleeding superficial gastric ulcer with no stigmata of bleeding       was found in the gastric fundus.      A single no bleeding angioectasia was found in the gastric fundus.       Coagulation for tissue destruction using argon plasma was successful.      Diffuse nodular mucosa was found in the gastric fundus and in the       gastric body. This was suggestive of Menetrier's disease, however the       mucosal folds were not as pronounced as would be expected with this       disease.      The examined duodenum was normal. Impression:           - Normal esophagus.                       - Z-line regular, 40 cm from the incisors.                       - Non-bleeding gastric ulcer with no stigmata of                        bleeding.                       - A single non-bleeding angioectasia in the stomach.                        Treated with argon plasma coagulation (APC).                       - Nodular mucosa in the gastric fundus and in the                        gastric body.                       - Normal examined duodenum.                       - No specimens collected. Recommendation:       - Return patient to hospital ward for ongoing care.                       -  Clear liquid diet today.                       - Use Protonix (pantoprazole) 40 mg IV BID. Transition                        to Pantoprazole 40 mg po BID for 8 weeks on discharge.                       - Repeat upper endoscopy in 8 weeks to check healing                        and given increased malignancy risk with gastric ulcer.                        Additionally, appearance of gastric folds were                        suggestive of Menetrier's  disease, however the mucosal                        folds were not as pronounced as would be expected with                        this disease. Consider biopsy of this area on repeat                        endoscopy in 8 weeks (biopsy deferred today given                        active bleeding and recent Eliquis use).                       - Discuss timing of Eliquis with the patient's                        cardiologist based on risks and benefits given                        co-morbidities. No increased of bleeding should be                        conferred from therapies today, however patient may be                        at continued risk of bleeding given gastric ulcer. Procedure Code(s):    --- Professional ---                       [phone], Esophagogastroduodenoscopy, flexible, transoral;                        with ablation of tumor(s), polyp(s), or other lesion(s)                        (includes pre- and post-dilation and guide wire                        passage, when performed) Diagnosis Code(s):    --- Professional ---  K92.1, Melena (includes Hematochezia)                       K31.819, Angiodysplasia of stomach and duodenum without                        bleeding                       K25.9, Gastric ulcer, unspecified as acute or chronic,                        without hemorrhage or perforation CPT copyright 2016 American Medical Association. All rights reserved. The codes documented in this report are preliminary and upon coder review may  be revised to meet current compliance requirements. Dr. Vance Gather, MD [name], MD 11/23/2015 9:37:06 AM This report has been signed electronically. Number of Addenda: 0 Note Initiated On: 11/23/2015 8:54 AM Estimated Blood Loss: Estimated blood loss was minimal.      [facility]

## 2015-11-24 ENCOUNTER — Encounter: Payer: Self-pay | Admitting: Internal Medicine

## 2015-11-24 LAB — GLUCOSE, CAPILLARY
GLUCOSE-CAPILLARY: 158 mg/dL — AB (ref 65–99)
GLUCOSE-CAPILLARY: 198 mg/dL — AB (ref 65–99)
Glucose-Capillary: 164 mg/dL — ABNORMAL HIGH (ref 65–99)
Glucose-Capillary: 220 mg/dL — ABNORMAL HIGH (ref 65–99)

## 2015-11-24 LAB — BASIC METABOLIC PANEL
ANION GAP: 7 (ref 5–15)
BUN: 97 mg/dL — ABNORMAL HIGH (ref 6–20)
CALCIUM: 8.3 mg/dL — AB (ref 8.9–10.3)
CO2: 19 mmol/L — AB (ref 22–32)
Chloride: 113 mmol/L — ABNORMAL HIGH (ref 101–111)
Creatinine, Ser: 2.04 mg/dL — ABNORMAL HIGH (ref 0.61–1.24)
GFR calc Af Amer: 32 mL/min — ABNORMAL LOW (ref >60)
GFR calc non Af Amer: 28 mL/min — ABNORMAL LOW (ref >60)
GLUCOSE: 178 mg/dL — AB (ref 65–99)
Potassium: 3.5 mmol/L (ref 3.5–5.1)
Sodium: 139 mmol/L (ref 135–145)

## 2015-11-24 LAB — CBC
HEMATOCRIT: 22.8 % — AB (ref 40.0–52.0)
HEMOGLOBIN: 7.7 g/dL — AB (ref 13.0–18.0)
MCH: 28.9 pg (ref 26.0–34.0)
MCHC: 33.7 g/dL (ref 32.0–36.0)
MCV: 85.5 fL (ref 80.0–100.0)
Platelets: 228 10*3/uL (ref 150–440)
RBC: 2.67 MIL/uL — ABNORMAL LOW (ref 4.40–5.90)
RDW: 20.7 % — ABNORMAL HIGH (ref 11.5–14.5)
WBC: 8.5 10*3/uL (ref 3.8–10.6)

## 2015-11-24 MED ORDER — PANTOPRAZOLE SODIUM 40 MG PO TBEC
40.0000 mg | DELAYED_RELEASE_TABLET | Freq: Two times a day (BID) | ORAL | Status: DC
  Administered 2015-11-24 – 2015-11-25 (×3): 40 mg via ORAL
  Filled 2015-11-24 (×3): qty 1

## 2015-11-24 MED ORDER — SODIUM CHLORIDE 0.9 % IV SOLN
400.0000 mg | Freq: Once | INTRAVENOUS | Status: AC
  Administered 2015-11-24: 400 mg via INTRAVENOUS
  Filled 2015-11-24: qty 20

## 2015-11-24 MED ORDER — BETHANECHOL CHLORIDE 25 MG PO TABS
25.0000 mg | ORAL_TABLET | Freq: Three times a day (TID) | ORAL | Status: DC
Start: 2015-11-24 — End: 2015-11-25
  Administered 2015-11-24 (×3): 25 mg via ORAL
  Filled 2015-11-24 (×7): qty 1

## 2015-11-24 MED ORDER — TAMSULOSIN HCL 0.4 MG PO CAPS
0.4000 mg | ORAL_CAPSULE | Freq: Two times a day (BID) | ORAL | Status: DC
  Administered 2015-11-24 – 2015-11-25 (×2): 0.4 mg via ORAL
  Filled 2015-11-24 (×2): qty 1

## 2015-11-24 NOTE — Progress Notes (Signed)
Dr.  Renae Gloss on floor now - updated on patient's voiding issues overnight. Orders to discontinue continuous IV fluids. MD to round later. Will continue to monitor.

## 2015-11-24 NOTE — Progress Notes (Signed)
Pt has been urinating frequently this shift, with little output each time. This am c/o discomfort. Bladder scanner shows . MD paged, no response at this time. RN will continue to monitor. Syliva Overman, RN

## 2015-11-24 NOTE — Progress Notes (Signed)
Patient continuing to have frequent urination. Output is still little at a time, but better than overnight now that patient is taking flomax and bethanechol. No discomfort at rest anymore. Dr. Markham Jordan and PA rounding at this time, instructed to bladder scan again in the morning or sooner if discomfort returns. Patient resting quietly in bed at this time. Will continue to monitor.

## 2015-11-24 NOTE — Progress Notes (Signed)
Patient ID: [name], male   DOB: 1927/10/23, 82 y.o.   MRN: [id number] Patient ID: [name], male   DOB: 02-01-1928, 82 y.o.   MRN: [id number] [employer] PROGRESS NOTE  Cedrik Heindl NWG:[id number] DOB: 1927-07-11 DOA: 11/22/2015 PCP: Lauro Regulus., MD  HPI/Subjective: Patient is hungry and asking for food. He has noticed black stools for a few weeks. He has been on eliquis requests for atrial fibrillation. Found to have a hemoglobin of 4.1 on presentation  Objective: Filed Vitals:   11/24/15 0758 11/24/15 1151  BP: 128/64 130/56  Pulse: 56 58  Temp:  97.8 F (36.6 C)  Resp:  19    Filed Weights   11/22/15 1227  Weight: 75.751 kg (167 lb)    ROS: Review of Systems  Constitutional: Negative for fever and chills.  Eyes: Negative for blurred vision.  Respiratory: Negative for cough and shortness of breath.   Cardiovascular: Negative for chest pain.  Gastrointestinal: Positive for abdominal pain. Negative for nausea, vomiting, diarrhea, constipation and melena.  Genitourinary: Positive for dysuria.  Musculoskeletal: Negative for joint pain.  Neurological: Negative for dizziness and headaches.   Exam: Physical Exam  Constitutional: He is oriented to person, place, and time.  HENT:  Nose: No mucosal edema.  Mouth/Throat: No oropharyngeal exudate or posterior oropharyngeal edema.  Eyes: Conjunctivae, EOM and lids are normal. Pupils are equal, round, and reactive to light.  Neck: No JVD present. Carotid bruit is not present. No edema present. No thyroid mass and no thyromegaly present.  Cardiovascular: S1 normal, S2 normal and normal heart sounds.  Exam reveals no gallop.   No murmur heard. Pulses:      Dorsalis pedis pulses are 2+ on the right side, and 2+ on the left side.  Respiratory: No respiratory distress. He has no wheezes. He has no rhonchi. He has no rales.  GI: Soft. Bowel sounds are normal. There is tenderness in the suprapubic area.   Musculoskeletal:       Right ankle: He exhibits swelling.       Left ankle: He exhibits swelling.  Lymphadenopathy:    He has no cervical adenopathy.  Neurological: He is alert and oriented to person, place, and time. No cranial nerve deficit.  Skin: Skin is warm. No rash noted. Nails show no clubbing.  Psychiatric: He has a normal mood and affect.      Data Reviewed: Basic Metabolic Panel:  Recent Labs Lab 11/22/15 1245 11/23/15 0552 11/24/15 0335  NA 134* 137 139  K 4.9 3.8 3.5  CL 101 108 113*  CO2 15* 20* 19*  GLUCOSE 256* 132* 178*  BUN 130* 121* 97*  CREATININE 2.48* 2.42* 2.04*  CALCIUM 8.5* 8.4* 8.3*   Liver Function Tests:  Recent Labs Lab 11/22/15 1245  AST 31  ALT 16*  ALKPHOS 63  BILITOT 0.8  PROT 7.0  ALBUMIN 3.0*   CBC:  Recent Labs Lab 11/22/15 1245 11/23/15 0552 11/23/15 1421 11/23/15 2105 11/24/15 0335  WBC 8.7  --   --   --  8.5  HGB 4.1* 6.9* 7.9* 7.8* 7.7*  HCT 13.2*  --   --   --  22.8*  MCV 74.8*  --   --   --  85.5  PLT 355  --   --   --  228   Cardiac Enzymes:  Recent Labs Lab 11/22/15 1245  TROPONINI 0.04*    CBG:  Recent Labs Lab 11/23/15 1235 11/23/15 1631 11/23/15 2156 11/24/15 0734 11/24/15  1149  GLUCAP 142* 213* 167* 158* 164*     Scheduled Meds: . sodium chloride   Intravenous Once  . bethanechol  25 mg Oral TID  . carvedilol  6.25 mg Oral BID WC  . finasteride  5 mg Oral QHS  . insulin aspart  0-5 Units Subcutaneous QHS  . insulin aspart  0-9 Units Subcutaneous TID WC  . magnesium oxide  200 mg Oral Daily  . pantoprazole  40 mg Oral BID  . tamsulosin  0.4 mg Oral BID    Assessment/Plan:  1. Chronic slow GI bleed. Gastric ulcer and angiodysplasia seen on EGD. Eliquis stopped. Will need repeat endoscopy in 6-8 weeks. Continue PPI. Patient wanted to advance diet today. I will give IV iron today. Patient received total of 3 units of packed red blood cells during the hospital course. Check  hemoglobin tomorrow. Potential discharge home tomorrow. 2. Acute kidney injury. IV fluid hydration. Hold nephrotoxic medications. 3. BPH and Urinary retention. Patient absolutely refused an in and out catheter. I increased his Flomax to 0.4 mg twice a day. He is already on finasteride. I will also start Urecholine 3 times a day to try to get him urinating. 4. Atrial fibrillation history hold Eliquis 5. Type 2 diabetes mellitus on sliding scale 6. History of CAD. On Coreg  Code Status:     Code Status Orders        Start     Ordered   11/22/15 1811  Full code   Continuous     11/22/15 1810    Code Status History    Date Active Date Inactive Code Status Order ID Comments User Context   02/21/2015  8:30 PM 02/23/2015  9:59 PM Full Code [id number]  Houston Siren, MD Inpatient     Family Communication: Wife Yesterday Disposition Plan: Early as potential disposition is tomorrow.  Consultants:  Gastroenterology  Time spent: 24 minutes  Alford Highland  [employer]

## 2015-11-24 NOTE — Progress Notes (Signed)
CBG 198 per [place] at [place] NT. Glucometer not synching yet.

## 2015-11-24 NOTE — Consult Note (Signed)
GI Inpatient Follow-up Note  Patient Identification: [name] is a 82 y.o. male with a history of Afib (on Eliquis), DM II, and CAD admitted with melena on 11/22/15.  Hgb initially 4.1 in the ED, now stable at 7.7 after multiple transfusions and Protonix drip.  EGD yesterday morning demonstrated one non-bleeding gastric ulcer and a non-bleeding angioectasia in the gastric fundus (treated with APC).  Irregular Z line and diffuse nodular mucosa in the gastric body were also noted.  Patient reports this was his third episode of melena in three years; however, he has not had an EGD in several years. Last colonoscopy in 2015 was notable for diverticulosis.   Subjective: Patient reports he is feeling well today.  He had a BM this morning that was "very dark and black", without frank blood; a second BM one hour ago was "more green", but still "very dark". He states these were his first BMs since early Saturday.  Patient also complains of urinary retention, worsening since yesterday morning.  No additional GI concerns at this time.  Scheduled Inpatient Medications:  . sodium chloride   Intravenous Once  . bethanechol  25 mg Oral TID  . carvedilol  6.25 mg Oral BID WC  . finasteride  5 mg Oral QHS  . insulin aspart  0-5 Units Subcutaneous QHS  . insulin aspart  0-9 Units Subcutaneous TID WC  . iron sucrose  400 mg Intravenous Once  . magnesium oxide  200 mg Oral Daily  . pantoprazole  40 mg Oral BID  . tamsulosin  0.4 mg Oral BID    Continuous Inpatient Infusions:     PRN Inpatient Medications:  acetaminophen **OR** acetaminophen, ondansetron **OR** ondansetron (ZOFRAN) IV, oxyCODONE-acetaminophen  Review of Systems: Constitutional: Weight is stable.  Eyes: No changes in vision. ENT: No oral lesions, sore throat.  GI: see HPI.  Heme/Lymph: No easy bruising.  CV: No chest pain.  GU: No hematuria.  Integumentary: No rashes.  Neuro: No headaches.  Psych: No depression/anxiety.   Endocrine: No heat/cold intolerance.  Allergic/Immunologic: No urticaria.  Resp: No cough, SOB.  Musculoskeletal: No joint swelling.    Physical Examination: BP 130/56 mmHg  Pulse 58  Temp(Src) 97.8 F (36.6 C) (Oral)  Resp 19  Ht  (1.651 m)  Wt 75.751 kg (167 lb)  BMI 27.79 kg/m2  SpO2 100% Gen: NAD, alert and oriented x 4 HEENT: PEERLA, EOMI, Neck: supple, no JVD or thyromegaly Chest: CTA bilaterally, no wheezes, crackles, or other adventitious sounds CV: RRR, no m/g/c/r Abd: soft, NT, ND, +BS in all four quadrants; no HSM, guarding, ridigity, or rebound tenderness Ext: no edema, well perfused with 2+ pulses, Skin: no rash or lesions noted Lymph: no LAD  Data: Lab Results  Component Value Date   WBC 8.5 11/24/2015   HGB 7.7* 11/24/2015   HCT 22.8* 11/24/2015   MCV 85.5 11/24/2015   PLT 228 11/24/2015    Recent Labs Lab 11/23/15 1421 11/23/15 2105 11/24/15 0335  HGB 7.9* 7.8* 7.7*   Lab Results  Component Value Date   NA 139 11/24/2015   K 3.5 11/24/2015   CL 113* 11/24/2015   CO2 19* 11/24/2015   BUN 97* 11/24/2015   CREATININE 2.04* 11/24/2015   Lab Results  Component Value Date   ALT 16* 11/22/2015   AST 31 11/22/2015   ALKPHOS 63 11/22/2015   BILITOT 0.8 11/22/2015    Recent Labs Lab 11/22/15 1245  INR 1.56   Assessment/Plan: Mr. Shinault is  a 82 y.o. male history of Afib (on Eliquis), DM II, and CAD admitted with melena on 11/22/15.  Hgb was 4.1 upon arrival to the ED - now stable x 3 around 7.7.  EGD demonstrated one non-bleeding gastric ulcer and one non-bleeding angioectasia in the gastric body.  Likely a slow, chronic bleeding due to oozing ulcer/AVM in the setting of Eliquis.  Patient improving symptomatically, with last stool one hour ago "slightly more green than black in color".  Will continue to monitor Hgb and stools.  Also recommend repeat bladder scan in the morning to follow-up on urinary retention, though this appears to be a  chronic issue exacerbated yesterday.  Recommendations: - Continue monitoring Hgb, transfuse if <7 - Repeat bladder scan in the morning, or sooner if indicated - Further recs pending above results  Patient has been discussed with Dr. Mechele Collin, pending further GI recommendations at this time. Please call with questions or concerns.  Burman Freestone, PA-C [facility] Phone: [phone] Pager: [phone]

## 2015-11-24 NOTE — Progress Notes (Signed)
Dr. Renae Gloss called to check on patient's urination. Updated that patient voided more the most recent time and has been able to wait the longest amount of time between attempts. Patient denies discomfort while resting. MD to adjust meds as needed since patient does not want [place] to do a straight cath or a foley. Bladder scan not indicated at this time.  Will continue to monitor.

## 2015-11-24 NOTE — Progress Notes (Signed)
Pt refuses bed alarm at this time. Educated on importance of calling nursing before up out of bed. Verbally agrees. Syliva Overman, RN

## 2015-11-24 NOTE — Progress Notes (Signed)
Pt refusing bed/chair alarm. Educated on safety. Will continue to monitor.

## 2015-11-25 LAB — BASIC METABOLIC PANEL
ANION GAP: 6 (ref 5–15)
BUN: 61 mg/dL — ABNORMAL HIGH (ref 6–20)
CALCIUM: 8.5 mg/dL — AB (ref 8.9–10.3)
CO2: 21 mmol/L — ABNORMAL LOW (ref 22–32)
CREATININE: 1.79 mg/dL — AB (ref 0.61–1.24)
Chloride: 114 mmol/L — ABNORMAL HIGH (ref 101–111)
GFR, EST AFRICAN AMERICAN: 38 mL/min — AB (ref >60)
GFR, EST NON AFRICAN AMERICAN: 33 mL/min — AB (ref >60)
Glucose, Bld: 155 mg/dL — ABNORMAL HIGH (ref 65–99)
Potassium: 3.2 mmol/L — ABNORMAL LOW (ref 3.5–5.1)
SODIUM: 141 mmol/L (ref 135–145)

## 2015-11-25 LAB — CBC
HEMATOCRIT: 23.9 % — AB (ref 40.0–52.0)
Hemoglobin: 8 g/dL — ABNORMAL LOW (ref 13.0–18.0)
MCH: 27.7 pg (ref 26.0–34.0)
MCHC: 33.3 g/dL (ref 32.0–36.0)
MCV: 83.1 fL (ref 80.0–100.0)
Platelets: 227 10*3/uL (ref 150–440)
RBC: 2.88 MIL/uL — ABNORMAL LOW (ref 4.40–5.90)
RDW: 22 % — AB (ref 11.5–14.5)
WBC: 6.7 10*3/uL (ref 3.8–10.6)

## 2015-11-25 LAB — GLUCOSE, CAPILLARY: GLUCOSE-CAPILLARY: 161 mg/dL — AB (ref 65–99)

## 2015-11-25 MED ORDER — PANTOPRAZOLE SODIUM 40 MG PO TBEC: 40.0000 mg | DELAYED_RELEASE_TABLET | Freq: Two times a day (BID) | ORAL | Status: DC

## 2015-11-25 MED ORDER — TAMSULOSIN HCL 0.4 MG PO CAPS: 0.4000 mg | ORAL_CAPSULE | Freq: Two times a day (BID) | ORAL | Status: DC

## 2015-11-25 MED ORDER — POTASSIUM CHLORIDE CRYS ER 20 MEQ PO TBCR
40.0000 meq | EXTENDED_RELEASE_TABLET | Freq: Once | ORAL | Status: AC
  Administered 2015-11-25: 40 meq via ORAL
  Filled 2015-11-25: qty 2

## 2015-11-25 NOTE — Progress Notes (Signed)
Dr. Renae Gloss rounding now. Patient is to get a foley placed to take home and follow-up with urology in one week. Notified MD of low potassium.

## 2015-11-25 NOTE — Care Management Important Message (Signed)
Important Message  Patient Details  Name: [name] MRN: [id number] Date of Birth: 1928/03/20   Medicare Important Message Given:  Yes    Marily Memos, RN 11/25/2015, 8:33 AM

## 2015-11-25 NOTE — Discharge Summary (Signed)
[facility] - [place] [facility]   PATIENT NAME: [name]    MR#:  [id number]  DATE OF BIRTH:  [birth year]  DATE OF ADMISSION:  11/22/2015 ADMITTING PHYSICIAN: Enedina Finner, MD  DATE OF DISCHARGE: 11/25/2015 11:33 AM  PRIMARY CARE PHYSICIAN: Lauro Regulus., MD    ADMISSION DIAGNOSIS:  Symptomatic anemia [D64.9] Gastrointestinal hemorrhage, unspecified gastritis, unspecified gastrointestinal hemorrhage type [K92.2]  DISCHARGE DIAGNOSIS:  Active Problems:   GI bleed   SECONDARY DIAGNOSIS:   Past Medical History  Diagnosis Date  . Pure hypercholesterolemia   . Type II or unspecified type diabetes mellitus without mention of complication, uncontrolled   . Hypersomnia with sleep apnea, unspecified   . Other and unspecified hyperlipidemia   . Essential hypertension, benign   . Coronary artery disease   . BPH (benign prostatic hypertrophy)   . Diverticulosis   . MI (myocardial infarction) (HCC)   . GI bleed 2015    HOSPITAL COURSE:   1. Chronic slow GI bleed. Gastric ulcer and angiectasia seen on EGD. Angiectasia cauterized. Eliquis stopped. Patient was initially on Protonix drip and switched over to twice a day PPI upon discharge home. Patient tolerated advanced diet. He will need a repeat endoscopy in 6-8 weeks to ensure healing of gastric ulcer. 2. Acute on chronic iron deficiency anemia. Hemoglobin on presentation 4.1. Patient was transfused a total of 3 units of packed red blood cells during the hospital course. I did give IV Venofer 400 mg 1 dose. Hemoglobin 8.0 upon discharge home 3. Acute kidney injury on chronic kidney disease stage III. Creatinine did improve to 1.79. 4. Urinary retention and BPH. I placed a Foley catheter for urinary retention of 650 mL and lower abdominal pain.. Nursing staff taught patient and wife how to empty this catheter. He can follow up with urology as outpatient for voiding trial. He was already on finasteride and  I added Flomax 0.4 mg twice a day 5. Atrial fibrillation. Higher risk of stroke being off Eliquis. But with recent GI bleeding we need to hold it at this point in time. Follow-up with cardiology Dr. Mariah Milling for consideration of restarting this in a few weeks. 6. Type 2 diabetes mellitus. Can restart Lantus low-dose but hold oral medications 7. History of coronary artery disease on Coreg. 8. Hypokalemia likely secondary to being on liquid diet for a period time. Replaced upon discharge 9. Elevated troponin. This is demand ischemia from severe anemia   DISCHARGE CONDITIONS:   Satisfactory  CONSULTS OBTAINED:  Gastroenterology  DRUG ALLERGIES:   Allergies  Allergen Reactions  . Exforge [Amlodipine Besylate-Valsartan] Swelling and Other (See Comments)    Pt states that he is unable to swallow.    . Lisinopril Swelling and Other (See Comments)    Pt states that he is unable to swallow.    . Niacin And Related Swelling and Other (See Comments)    Pt states that he is unable to swallow.      DISCHARGE MEDICATIONS:   Discharge Medication List as of 11/25/2015 10:12 AM    CONTINUE these medications which have CHANGED   Details  pantoprazole (PROTONIX) 40 MG tablet Take 1 tablet (40 mg total) by mouth 2 (two) times daily., Starting 11/25/2015, Until Discontinued, Print    tamsulosin (FLOMAX) 0.4 MG CAPS capsule Take 1 capsule (0.4 mg total) by mouth 2 (two) times daily., Starting 11/25/2015, Until Discontinued, Print      CONTINUE these medications which have NOT CHANGED   Details  carvedilol (COREG) 6.25 MG tablet TAKE 1 TABLET (6.25 MG TOTAL) BY MOUTH 2 (TWO) TIMES DAILY WITH A MEAL., Normal    finasteride (PROSCAR) 5 MG tablet Take 5 mg by mouth at bedtime., Until Discontinued, Historical Med    insulin glargine (LANTUS) 100 UNIT/ML injection Inject 12 Units into the skin every morning. , Until Discontinued, Historical Med    oxyCODONE-acetaminophen (PERCOCET/ROXICET) 5-325 MG  tablet Take by mouth every 4 (four) hours as needed for severe pain., Until Discontinued, Historical Med      STOP taking these medications     apixaban (ELIQUIS) 2.5 MG TABS tablet      Coenzyme Q10 (CO Q 10) 100 MG CAPS      furosemide (LASIX) 40 MG tablet      glipiZIDE (GLUCOTROL) 10 MG tablet      losartan (COZAAR) 50 MG tablet      Magnesium 250 MG TABS      metolazone (ZAROXOLYN) 5 MG tablet      potassium chloride (KLOR-CON) 20 MEQ packet      sitaGLIPtin-metformin (JANUMET) 50-1000 MG per tablet      colchicine-probenecid 0.5-500 MG tablet          DISCHARGE INSTRUCTIONS:   Follow-up PMD one week Follow up with urology 1 week as outpatient for voiding trial Follow-up with Dr. Mariah Milling cardiology 1-2 weeks Follow-up with Dr. Mechele Collin gastroenterology 6-8 weeks  If you experience worsening of your admission symptoms, develop shortness of breath, life threatening emergency, suicidal or homicidal thoughts you must seek medical attention immediately by calling 911 or calling your MD immediately  if symptoms less severe.  You Must read complete instructions/literature along with all the possible adverse reactions/side effects for all the Medicines you take and that have been prescribed to you. Take any new Medicines after you have completely understood and accept all the possible adverse reactions/side effects.   Please note  You were cared for by a hospitalist during your hospital stay. If you have any questions about your discharge medications or the care you received while you were in the hospital after you are discharged, you can call the unit and asked to speak with the hospitalist on call if the hospitalist that took care of you is not available. Once you are discharged, your primary care physician will handle any further medical issues. Please note that NO REFILLS for any discharge medications will be authorized once you are discharged, as it is imperative that you  return to your primary care physician (or establish a relationship with a primary care physician if you do not have one) for your aftercare needs so that they can reassess your need for medications and monitor your lab values.    Today   CHIEF COMPLAINT:   Chief Complaint  Patient presents with  . Chest Pain  . Melena    HISTORY OF PRESENT ILLNESS:  [name]  is a 82 y.o. male presented with chest pain and melena. Found to have a hemoglobin of 4.1 on presentation   VITAL SIGNS:  Blood pressure 148/64, pulse 81, temperature 97.9 F (36.6 C), temperature source Oral, resp. rate 19, height 5\' 5"  (1.651 m), weight 75.751 kg (167 lb), SpO2 99 %.    PHYSICAL EXAMINATION:  GENERAL:  82 y.o.-year-old patient lying in the bed with no acute distress.  EYES: Pupils equal, round, reactive to light and accommodation. No scleral icterus. Extraocular muscles intact.  HEENT: Head atraumatic, normocephalic. Oropharynx and nasopharynx clear.  NECK:  Supple, no jugular venous distention. No thyroid enlargement, no tenderness.  LUNGS: Normal breath sounds bilaterally, no wheezing, rales,rhonchi or crepitation. No use of accessory muscles of respiration.  CARDIOVASCULAR: S1, S2 normal. No murmurs, rubs, or gallops.  ABDOMEN: Soft, Suprapubic pain prior to Foley catheter insertion, non-distended. Bowel sounds present. No organomegaly or mass.  EXTREMITIES: Trace pedal edema, no cyanosis, or clubbing.  NEUROLOGIC: Cranial nerves II through XII are intact. Muscle strength 5/5 in all extremities. Sensation intact. Gait not checked.  PSYCHIATRIC: The patient is alert and oriented x 3.  SKIN: No obvious rash, lesion, or ulcer.   DATA REVIEW:   CBC  Recent Labs Lab 11/25/15 0453  WBC 6.7  HGB 8.0*  HCT 23.9*  PLT 227    Chemistries   Recent Labs Lab 11/22/15 1245  11/25/15 0453  NA 134*  < > 141  K 4.9  < > 3.2*  CL 101  < > 114*  CO2 15*  < > 21*  GLUCOSE 256*  < > 155*  BUN  130*  < > 61*  CREATININE 2.48*  < > 1.79*  CALCIUM 8.5*  < > 8.5*  AST 31  --   --   ALT 16*  --   --   ALKPHOS 63  --   --   BILITOT 0.8  --   --   < > = values in this interval not displayed.  Cardiac Enzymes  Recent Labs Lab 11/22/15 1245  TROPONINI 0.04*   Management plans discussed with the patient, family and they are in agreement.  CODE STATUS:  Code Status History    Date Active Date Inactive Code Status Order ID Comments User Context   11/22/2015  6:10 PM 11/25/2015  2:33 PM Full Code [id number]  Enedina Finner, MD Inpatient   02/21/2015  8:30 PM 02/23/2015  9:59 PM Full Code [id number]  Houston Siren, MD Inpatient      TOTAL TIME TAKING CARE OF THIS PATIENT: 35 minutes.    Alford Highland M.D on 11/25/2015 at 5:27 PM  Between 7am to 6pm - Pager - [phone]  After 6pm go to [email or web address] - password [email or web address]  [facility] Office  [phone]  CC: Primary care physician; Lauro Regulus., MD

## 2015-11-25 NOTE — Progress Notes (Signed)
Patient given discharge teaching and paperwork regarding medications, diet, follow-up appointments and activity. Patient understanding verbalized. No complaints at this time. IV and telemetry discontinued prior to leaving. Skin assessment as previously charted and vitals are stable; on room air. Patient being discharged to home. Wife present during discharge teaching. Prescriptions given to wife. Foley drainage bag changed to leg bag per Dr. Mathews Robinsons request with verbal and written education given to patient and wife.

## 2015-11-26 LAB — TYPE AND SCREEN
ABO/RH(D): A POS
ANTIBODY SCREEN: NEGATIVE
UNIT DIVISION: 0
UNIT DIVISION: 0
Unit division: 0
Unit division: 0

## 2015-12-02 ENCOUNTER — Ambulatory Visit (INDEPENDENT_AMBULATORY_CARE_PROVIDER_SITE_OTHER): Payer: Medicare Other | Admitting: Urology

## 2015-12-02 VITALS — BP 110/58 | HR 79 | Ht 65.0 in | Wt 188.0 lb

## 2015-12-02 DIAGNOSIS — N183 Chronic kidney disease, stage 3 unspecified: Secondary | ICD-10-CM | POA: Insufficient documentation

## 2015-12-02 DIAGNOSIS — N138 Other obstructive and reflux uropathy: Secondary | ICD-10-CM

## 2015-12-02 DIAGNOSIS — N200 Calculus of kidney: Secondary | ICD-10-CM | POA: Insufficient documentation

## 2015-12-02 DIAGNOSIS — N401 Enlarged prostate with lower urinary tract symptoms: Secondary | ICD-10-CM

## 2015-12-02 DIAGNOSIS — R339 Retention of urine, unspecified: Secondary | ICD-10-CM | POA: Diagnosis not present

## 2015-12-02 MED ORDER — TAMSULOSIN HCL 0.4 MG PO CAPS: 0.4000 mg | ORAL_CAPSULE | Freq: Every day | ORAL | Status: DC

## 2015-12-02 MED ORDER — FINASTERIDE 5 MG PO TABS: 5.0000 mg | ORAL_TABLET | Freq: Every day | ORAL | Status: DC

## 2015-12-02 NOTE — Progress Notes (Signed)
12/02/2015 9:29 AM   [name] [birth year] [id number]  Referring provider: Lauro Regulus, MD [address] [facility] [place] [facility] [place], [place] [address]  Chief Complaint  Patient presents with  . Urinary Retention    New Patient    HPI:  1 - Enlarged Prostate with Urinary Retention - long h/o obstructive sympotms managed on daily finasteride x years per Dr. Lonna Cobb. New urinary retention during hospitalizaiton for GI bleed 10/2015 and placed on tamsulosin in additiona and DC's with foley. Passed trial of void 11/2015 and staying on daily combination therapy.  2 - Nephrolithiasis - s/p ureteroscopy x 1 prior. No colic in years.  3 - Stage 3 Chronic Kidney Disease -  Cr 2's x several 2017. Remote renal imaigng w/o hydro. Not currently established with nephrology.  PMH sig for obesity, CAD/Stent/CHF/Lasix/Eliquus (held), follows cards, GI bleed. No chest or abdominal surgeries. His PCP is Einar Crow with [facility].   Today "[name]" is seen as new patient work in for above, specifically for further eval and management of recent urinary retention.    PMH: Past Medical History  Diagnosis Date  . Pure hypercholesterolemia   . Type II or unspecified type diabetes mellitus without mention of complication, uncontrolled   . Hypersomnia with sleep apnea, unspecified   . Other and unspecified hyperlipidemia   . Essential hypertension, benign   . Coronary artery disease   . BPH (benign prostatic hypertrophy)   . Diverticulosis   . MI (myocardial infarction) (HCC)   . GI bleed 2015    Surgical History: Past Surgical History  Procedure Laterality Date  . Tonsillectomy    . Inguinal hernia repair      left inguinal   . Cholecystectomy  1994  . Flexible sigmoidoscopy    . Cardiac catheterization    . Coronary angioplasty  1996    [place]  . Esophagogastroduodenoscopy (egd) with propofol Left 11/23/2015    Procedure: ESOPHAGOGASTRODUODENOSCOPY (EGD) WITH  PROPOFOL;  Surgeon: [name], MD;  [facility] ENDOSCOPY;  Service: Endoscopy;  Laterality: Left;    Home Medications:    Medication List       This list is accurate as of: 12/02/15  9:29 AM.  Always use your most recent med list.               carvedilol 6.25 MG tablet  Commonly known as:  COREG  TAKE 1 TABLET (6.25 MG TOTAL) BY MOUTH 2 (TWO) TIMES DAILY WITH A MEAL.     finasteride 5 MG tablet  Commonly known as:  PROSCAR  Take 1 tablet (5 mg total) by mouth daily.     insulin glargine 100 UNIT/ML injection  Commonly known as:  LANTUS  Inject 12 Units into the skin every morning.     oxyCODONE-acetaminophen 5-325 MG tablet  Commonly known as:  PERCOCET/ROXICET  Take by mouth every 4 (four) hours as needed for severe pain. Reported on 12/02/2015     pantoprazole 40 MG tablet  Commonly known as:  PROTONIX  Take 1 tablet (40 mg total) by mouth 2 (two) times daily.     tamsulosin 0.4 MG Caps capsule  Commonly known as:  FLOMAX  Take 1 capsule (0.4 mg total) by mouth daily.        Allergies:  Allergies  Allergen Reactions  . Exforge [Amlodipine Besylate-Valsartan] Swelling and Other (See Comments)    Pt states that he is unable to swallow.    . Lisinopril  Swelling and Other (See Comments)    Pt states that he is unable to swallow.    . Niacin And Related Swelling and Other (See Comments)    Pt states that he is unable to swallow.      Family History: Family History  Problem Relation Age of Onset  . Rheum arthritis Mother   . Alzheimer's disease Father     Social History:  reports that he has quit smoking. His smoking use included Cigarettes. He has a 50 pack-year smoking history. He does not have any smokeless tobacco history on file. He reports that he does not drink alcohol or use illicit drugs.  ROS: UROLOGY Frequent Urination?: Yes Hard to postpone urination?: No Burning/pain with urination?: No Get up at night to urinate?: Yes Leakage of  urine?: No Urine stream starts and stops?: No Trouble starting stream?: No Do you have to strain to urinate?: No Blood in urine?: No Urinary tract infection?: No Sexually transmitted disease?: No Injury to kidneys or bladder?: No Painful intercourse?: No Weak stream?: No Erection problems?: No Penile pain?: No  Gastrointestinal Nausea?: No Vomiting?: No Indigestion/heartburn?: No Diarrhea?: No Constipation?: No  Constitutional Fever: No Night sweats?: No Weight loss?: No Fatigue?: No  Skin Skin rash/lesions?: No Itching?: No  Eyes Blurred vision?: No Double vision?: No  Ears/Nose/Throat Sore throat?: No Sinus problems?: No  Hematologic/Lymphatic Swollen glands?: No Easy bruising?: No  Cardiovascular Leg swelling?: Yes Chest pain?: No  Respiratory Cough?: No Shortness of breath?: No  Endocrine Excessive thirst?: No  Musculoskeletal Back pain?: No Joint pain?: No  Neurological Headaches?: No Dizziness?: No  Psychologic Depression?: No Anxiety?: No  Physical Exam: BP 110/58 mmHg  Pulse 79  Ht 5\' 5"  (1.651 m)  Wt 85.276 kg (188 lb)  BMI 31.28 kg/m2  Constitutional:  Alert and oriented, No acute distress. HEENT: [place] AT, moist mucus membranes.  Trachea midline, no masses. Cardiovascular: No clubbing, cyanosis, or edema. Respiratory: Normal respiratory effort, no increased work of breathing. GI: Abdomen is soft, nontender, nondistended, no abdominal masses. Truncal obesity limits sensitivity.  GU: No CVA tenderness. Non-circ'd phalus w/o paraphimosis. DRE 50gm some assyemtry, no frank nodules. Testes down w/o masses Skin: No rashes, bruises or suspicious lesions. Lymph: No cervical or inguinal adenopathy. Significant pedal edema pitting to lower thigh.  Neurologic: Grossly intact, no focal deficits, moving all 4 extremities. Psychiatric: Normal mood and affect.  Laboratory Data: Lab Results  Component Value Date   WBC 6.7 11/25/2015   HGB  8.0* 11/25/2015   HCT 23.9* 11/25/2015   MCV 83.1 11/25/2015   PLT 227 11/25/2015    Lab Results  Component Value Date   CREATININE 1.79* 11/25/2015    No results found for: PSA  No results found for: TESTOSTERONE  No results found for: HGBA1C  Urinalysis No results found for: COLORURINE, APPEARANCEUR, LABSPEC, PHURINE, GLUCOSEU, HGBUR, BILIRUBINUR, KETONESUR, PROTEINUR, UROBILINOGEN, NITRITE, LEUKOCYTESUR  Pertinent Imaging: None recent  Assessment & Plan:    1 - Enlarged Prostate with Urinary Retention - continue finasteride + tamsulosin, both refilled today x 1 year. Passed trial of void today. Discussed symptoms of recurrent retention should the occur.   2 - Nephrolithiasis - observe.  3 - Stage 3 Chronic Kidney Disease -  likely medical renal exacerbated by CHF / poor perfusion state.  4 - RTC 1 year with PVR.      Return in about 1 year (around 12-08-16).  [name], MD  [facility] [address]  [place], [place] [address] [phone]

## 2015-12-02 NOTE — Progress Notes (Signed)
Fill and Pull Catheter Removal  Patient is present today for a catheter removal.  Patient was cleaned and prepped in a sterile fashion of sterile water was instilled into the bladder when the patient felt the urge to urinate. 50ml of water was then drained from the balloon.  A 16FR foley cath was removed from the bladder no complications were noted .  Patient as then given some time to void on their own.  Patient can void  on their own after some time.  Patient tolerated well.  Preformed by: Eligha Bridegroom, CMA

## 2015-12-04 ENCOUNTER — Telehealth: Payer: Self-pay

## 2015-12-04 ENCOUNTER — Ambulatory Visit (INDEPENDENT_AMBULATORY_CARE_PROVIDER_SITE_OTHER): Payer: Medicare Other

## 2015-12-04 DIAGNOSIS — R339 Retention of urine, unspecified: Secondary | ICD-10-CM

## 2015-12-04 LAB — BLADDER SCAN AMB NON-IMAGING: Scan Result: >745

## 2015-12-04 NOTE — Telephone Encounter (Signed)
Pt called stating that since having his catheter removed he has been having continuous leakage, not able to urinate but just dribbles, and have developed lower abd pain. Offered pt to come into office for a nurse visit for a PVR. Pt was added to nurse schedule for 2:00 this afternoon as pt declined to come in this morning.

## 2015-12-04 NOTE — Progress Notes (Signed)
Simple Catheter Placement  Due to urinary retention patient is present today for a foley cath placement.  Patient was cleaned and prepped in a sterile fashion with betadine and lidocaine jelly 2% was instilled into the urethra.  A 16 FR coude foley catheter was inserted, urine return was noted  , urine was yellow in color.  The balloon was filled with 10cc of sterile water.  A night bag was attached for drainage. Patient was also given a night bag to take home and was given instruction on how to change from one bag to another.  Patient was given instruction on proper catheter care.  Patient tolerated well, no complications were noted   Preformed by: Rupert Stacks, LPN   Additional notes/ Follow up: Pt made a 1 week f/u with Carollee Herter for a voiding trial and a f/u with Dr. Apolinar Junes. Per Carollee Herter a week of myrbetriq 25mg  was given for bladder spasms. Pt was instructed on how to take medication.

## 2015-12-09 ENCOUNTER — Ambulatory Visit (INDEPENDENT_AMBULATORY_CARE_PROVIDER_SITE_OTHER): Payer: Medicare Other | Admitting: Cardiovascular Disease

## 2015-12-09 ENCOUNTER — Encounter: Payer: Self-pay | Admitting: Cardiovascular Disease

## 2015-12-09 VITALS — BP 136/62 | HR 63 | Ht 65.0 in | Wt 169.0 lb

## 2015-12-09 DIAGNOSIS — N183 Chronic kidney disease, stage 3 unspecified: Secondary | ICD-10-CM

## 2015-12-09 DIAGNOSIS — E118 Type 2 diabetes mellitus with unspecified complications: Secondary | ICD-10-CM

## 2015-12-09 DIAGNOSIS — I5032 Chronic diastolic (congestive) heart failure: Secondary | ICD-10-CM

## 2015-12-09 DIAGNOSIS — K922 Gastrointestinal hemorrhage, unspecified: Secondary | ICD-10-CM

## 2015-12-09 DIAGNOSIS — I10 Essential (primary) hypertension: Secondary | ICD-10-CM

## 2015-12-09 DIAGNOSIS — I2510 Atherosclerotic heart disease of native coronary artery without angina pectoris: Secondary | ICD-10-CM | POA: Diagnosis not present

## 2015-12-09 DIAGNOSIS — I429 Cardiomyopathy, unspecified: Secondary | ICD-10-CM

## 2015-12-09 DIAGNOSIS — I4891 Unspecified atrial fibrillation: Secondary | ICD-10-CM | POA: Diagnosis not present

## 2015-12-09 DIAGNOSIS — E785 Hyperlipidemia, unspecified: Secondary | ICD-10-CM | POA: Diagnosis not present

## 2015-12-09 NOTE — Patient Instructions (Signed)
You are doing well. No medication changes were made.  Please call us if you have new issues that need to be addressed before your next appt.  Your physician wants you to follow-up in: 6 months.  You will receive a reminder letter in the mail two months in advance. If you don't receive a letter, please call our office to schedule the follow-up appointment.   

## 2015-12-09 NOTE — Progress Notes (Signed)
Patient ID: [name], male   DOB: May 10, 1928, 82 y.o.   MRN: [id number] Cardiology Office Note  Date:  12/09/2015   ID:  [name], DOB [birth year], MRN [id number]  PCP:  Lauro Regulus., MD   Chief Complaint  Patient presents with  . other    follow up from [facility]. Meds reviewed by the patient verbally. "doing well."    HPI:  [name] is a 82 year old gentleman presents for routine followup of his atrial fibrillation and GI bleeding Baseline creatinine 1.5 history of CAD, angioplasty in August 1996. PTCA of a diagonal branch and OM in 1996 He has obstructive sleep apnea, not on CPAP hospitalization 07/07/2013 for diverticular bleed, atrial fibrillation. He was given antibiotics for diverticulitis. Seen by Dr. Mechele Collin. No colonoscopy was performed. He was not started on anticoagulation by cardiology given his GI bleeding and anemia. As an outpatient, he was started  on eliquis 2.5 mgrams twice a day and was stable  In follow-up, recent hospital admission 11/22/2015 for profound anemia, hemoglobin 4.1 Wife reported he became progressively weak over April into May Did not notice dark colored stools, perhaps in hindsight was different color Required transfusion 4 in the hospital, all blood thinners were held Had testing including nuclear medicine GI bleeding scan which was negative for GI bleed. Hospital records were reviewed in detail with him  Other hospital complications include urine retention causing hydronephrosis, renal failure with creatinine greater than 2. He required Foley catheter placement and presents with Foley in place on today's clinic visit.  Also with significant leg swelling  He reports leg swelling has been slowly improving in the past week or so Skin is blistered in the lower extremities bilaterally, some oozing notably on the right leg  Not wearing compression hose or leg wraps Recent lab work reviewed with him showing hemoglobin of 8  He is  taking Lasix daily 80 mg in the morning, 40 mg in the evening, with metolazone as needed Denies any significant shortness of breath or PND No chest pain concerning for angina  EKG on today's visit shows normal sinus rhythm with ventricular rate 63 bpm, nonspecific ST abnormality  Other past medical history Previously had problems on Lipitor, muscle ache. Also recently stopped lovastatin. Currently not on any cholesterol medication Previous blood showing creatinine 1.6, BUN 29, hemoglobin A1c 7.6 Carvedilol previously decreased for bradycardia.   dizziness while playing golf 05/16/2013. He felt he was having the flu. Seen by PMD and given prednisone with antibiotics.  Developed blood per rectum in January 2015 . He was anemic in the hospital, hemoglobin in the 9 range  echocardiogram 06/26/2013 showing ejection fraction 45-50%, right ventricular systolic pressure 68 mmHg. He was instructed to increase his torsemide to 40 mg alternating with 20 mg Repeat echocardiogram July 08 2013 showed right ventricular systolic pressure 49, normal ejection fraction  normal stress test March 2009, normal ejection fraction by echocardiogram March 2009 He had a PTCA of a diagonal branch and OM in 1996  Allergies; 2 Exforge in 2008 with tongue swelling, rash  PMH:   has a past medical history of Pure hypercholesterolemia; Type II or unspecified type diabetes mellitus without mention of complication, uncontrolled; Hypersomnia with sleep apnea, unspecified; Other and unspecified hyperlipidemia; Essential hypertension, benign; Coronary artery disease; BPH (benign prostatic hypertrophy); Diverticulosis; MI (myocardial infarction) (HCC); and GI bleed (2015).  PSH:    Past Surgical History  Procedure Laterality Date  . Tonsillectomy    . Inguinal hernia repair  left inguinal   . Cholecystectomy  1994  . Flexible sigmoidoscopy    . Cardiac catheterization    . [place]ary [place]  [place]    [place]  . Esophagogastroduodenoscopy (egd) with propofol Left 11/23/2015    Procedure: ESOPHAGOGASTRODUODENOSCOPY (EGD) WITH PROPOFOL;  Surgeon: [name], MD;  Location: [facility] ENDOSCOPY;  Service: Endoscopy;  Laterality: Left;    Current Outpatient Prescriptions  Medication Sig Dispense Refill  . carvedilol (COREG) 6.25 MG tablet TAKE 1 TABLET (6.25 MG TOTAL) BY MOUTH 2 (TWO) TIMES DAILY WITH A MEAL. 180 tablet 3  . finasteride (PROSCAR) 5 MG tablet Take 1 tablet (5 mg total) by mouth daily. 90 tablet 3  . furosemide (LASIX) 40 MG tablet Take 2 tablets every am & 1 tablet every pm.    . insulin glargine (LANTUS) 100 UNIT/ML injection Inject 12 Units into the skin every morning.     Marland Kitchen losartan (COZAAR) 50 MG tablet Take 50 mg by mouth 2 (two) times daily.   0  . metolazone (ZAROXOLYN) 5 MG tablet Take 5 mg by mouth as needed.    Marland Kitchen oxyCODONE-acetaminophen (PERCOCET/ROXICET) 5-325 MG tablet Take by mouth every 4 (four) hours as needed for severe pain. Reported on 12/02/2015    . pantoprazole (PROTONIX) 40 MG tablet Take 1 tablet (40 mg total) by mouth 2 (two) times daily. 60 tablet 0  . potassium chloride (KLOR-CON) 20 MEQ packet Take 20 mEq by mouth daily.    . sitaGLIPtin-metformin (JANUMET) 50-1000 MG tablet Take 1 tablet by mouth 2 (two) times daily with a meal.    . tamsulosin (FLOMAX) 0.4 MG CAPS capsule Take 1 capsule (0.4 mg total) by mouth daily. 90 capsule 3   No current facility-administered medications for this visit.     Allergies:   Exforge; Lisinopril; and Niacin and related   Social History:  The patient  reports that he has quit smoking. His smoking use included Cigarettes. He has a 50 pack-year smoking history. He does not have any smokeless tobacco history on file. He reports that he does not drink alcohol or use illicit drugs.   Family History:   family history includes Alzheimer's disease in his father; Rheum arthritis in his mother.    Review of Systems: Review of  Systems  Constitutional: Positive for malaise/fatigue.  Respiratory: Positive for shortness of breath.   Cardiovascular: Positive for leg swelling.  Gastrointestinal: Negative.   Genitourinary:       Urine retention  Musculoskeletal: Negative.   Neurological: Negative.   Psychiatric/Behavioral: Negative.   All other systems reviewed and are negative.    PHYSICAL EXAM: VS:  BP 136/62 mmHg  Pulse 63  Ht  (1.651 m)  Wt 169 lb (76.658 kg)  BMI 28.12 kg/m2  SpO2 98% , BMI Body mass index is 28.12 kg/(m^2). GEN: Well nourished, well developed, in no acute distress HEENT: normal Neck: no JVD, carotid bruits, or masses Cardiac: RRR; no murmurs, rubs, or gallops, 2+ pitting edema to below the knee, regions of erythema, blistering over the shins bilaterally Respiratory:  clear to auscultation bilaterally, normal work of breathing GI: soft, nontender, nondistended, + BS MS: no deformity or atrophy Skin: warm and dry, no rash Neuro:  Strength and sensation are intact Psych: euthymic mood, full affect Foley catheter in place    Recent Labs: 11/22/2015: ALT 16* 11/25/2015: BUN 61*; Creatinine, Ser 1.79*; Hemoglobin 8.0*; Platelets 227; Potassium 3.2*; Sodium 141    Lipid Panel   Wt  Readings from Last 3 Encounters:  12/09/15 169 lb (76.658 kg)  12/02/15 188 lb (85.276 kg)  11/22/15 167 lb (75.751 kg)      ASSESSMENT AND PLAN:  Atrial fibrillation, unspecified type (HCC) -  All anticoagulation held given recent profound GI bleed with hemoglobin down to 4 requiring hospitalization We have recommended that he not restart his eliquis Second GI bleed in 2 years Maintaining normal sinus rhythm  Coronary artery disease involving native coronary artery of native heart without angina pectoris Currently with no symptoms of angina. No further workup at this time. Continue current medication regimen.  Hyperlipidemia He has declined a statin in the past  Chronic diastolic CHF  (congestive heart failure) (HCC) Encouraged him to continue on his current Lasix regiment Leg edema will likely improve as anemia resolves  Essential hypertension Blood pressure is well controlled on today's visit. No changes made to the medications.  Cardiomyopathy (HCC) Previous echocardiogram in 2015 was 45-50% If no rapid improvement in his leg edema, will repeat echocardiogram  Type 2 diabetes mellitus with complication, without long-term current use of insulin (HCC) We have encouraged continued careful diet management in an effort to lose weight.  Lower GI bleed He has follow-up with GI, Dr. Mechele Collin He did EGD, nonbleeding gastric ulcer noted Known history of diverticuli  Chronic progressive renal failure, stage 3 (moderate) Recent worsening of his renal failure ostomy secondary to hydronephrosis, urine outflow obstruction    Total encounter time more than 25 minutes  Greater than 50% was spent in counseling and coordination of care with the patient   Disposition:   F/U  6 months   Orders Placed This Encounter  Procedures  . Basic Metabolic Panel (BMET)  . EKG 12-Lead     Signed, Dossie Arbour, M.D., Ph.D. 12/09/2015  [facility] [place], [place] [phone]

## 2015-12-10 LAB — BASIC METABOLIC PANEL
BUN/Creatinine Ratio: 16 (ref 10–24)
BUN: 26 mg/dL (ref 8–27)
CALCIUM: 8.4 mg/dL — AB (ref 8.6–10.2)
CHLORIDE: 98 mmol/L (ref 96–106)
CO2: 23 mmol/L (ref 18–29)
Creatinine, Ser: 1.65 mg/dL — ABNORMAL HIGH (ref 0.76–1.27)
GFR calc Af Amer: 43 mL/min/{1.73_m2} — ABNORMAL LOW (ref >59)
GFR calc non Af Amer: 37 mL/min/{1.73_m2} — ABNORMAL LOW (ref >59)
GLUCOSE: 187 mg/dL — AB (ref 65–99)
Potassium: 4.1 mmol/L (ref 3.5–5.2)
Sodium: 140 mmol/L (ref 134–144)

## 2015-12-12 ENCOUNTER — Ambulatory Visit: Payer: Medicare Other | Admitting: Urology

## 2015-12-12 ENCOUNTER — Telehealth: Payer: Self-pay | Admitting: Urology

## 2015-12-12 NOTE — Telephone Encounter (Signed)
Spoke with pt in reference to small blood clots. Reinforced with pt to push fluids and as long as the catheter is draining then everything is ok. Pt voiced understanding.

## 2015-12-12 NOTE — Telephone Encounter (Signed)
Pt states there is reddish brown matter in his bag.  2 are in bag, and 1 is in bubble.  He is concerned and would like for you to give him a call.  [phone]

## 2015-12-15 ENCOUNTER — Encounter: Payer: Self-pay | Admitting: Urology

## 2015-12-15 ENCOUNTER — Ambulatory Visit (INDEPENDENT_AMBULATORY_CARE_PROVIDER_SITE_OTHER): Payer: Medicare Other

## 2015-12-15 ENCOUNTER — Telehealth: Payer: Self-pay

## 2015-12-15 ENCOUNTER — Ambulatory Visit (INDEPENDENT_AMBULATORY_CARE_PROVIDER_SITE_OTHER): Payer: Medicare Other | Admitting: Urology

## 2015-12-15 VITALS — BP 136/69 | HR 69 | Ht 65.0 in | Wt 166.5 lb

## 2015-12-15 DIAGNOSIS — R339 Retention of urine, unspecified: Secondary | ICD-10-CM | POA: Diagnosis not present

## 2015-12-15 NOTE — Progress Notes (Signed)
Simple Catheter Placement  Due to urinary retention patient is present today for a foley cath placement.  Patient was cleaned and prepped in a sterile fashion with betadine and lidocaine jelly 2% was instilled into the urethra.  A 16 FR coude foley catheter was inserted, urine return was noted  , urine was yellow in color.  The balloon was filled with 10cc of sterile water.  A leg bag was attached for drainage. Patient was also given a night bag to take home and was given instruction on how to change from one bag to another.  Patient was given instruction on proper catheter care.  Patient tolerated well, no complications were noted   Preformed by: Rupert Stacks, LPN

## 2015-12-15 NOTE — Progress Notes (Unsigned)
12/15/2015 8:37 AM   Hence Nuttle [birth year] [id number]  Referring provider: Lauro Regulus, MD [address] [facility] [place], [place] [address]  Chief Complaint  Patient presents with  . Follow-up    Enlarged Prostate with Urinary Retention    HPI: Dr Berneice Heinrich  1 - Enlarged Prostate with Urinary Retention - long h/o obstructive sympotms managed on daily finasteride x years per Dr. Lonna Cobb. New urinary retention during hospitalizaiton for GI bleed 10/2015 and placed on tamsulosin in additiona and DC's with foley. Passed trial of void 11/2015 and staying on daily combination therapy.  2 - Nephrolithiasis - s/p ureteroscopy x 1 prior. No colic in years.  3 - Stage 3 Chronic Kidney Disease - Cr 2's x several 2017. Remote renal imaigng w/o hydro. Not currently established with nephrology.  PMH sig for obesity, CAD/Stent/CHF/Lasix/Eliquus (held), follows cards, GI bleed. No chest or abdominal surgeries. His PCP is Einar Crow with [facility].    PMH: Past Medical History  Diagnosis Date  . Pure hypercholesterolemia   . Type II or unspecified type diabetes mellitus without mention of complication, uncontrolled   . Hypersomnia with sleep apnea, unspecified   . Other and unspecified hyperlipidemia   . Essential hypertension, benign   . Coronary artery disease   . BPH (benign prostatic hypertrophy)   . Diverticulosis   . MI (myocardial infarction) (HCC)   . GI bleed 2015    Surgical History: Past Surgical History  Procedure Laterality Date  . Tonsillectomy    . Inguinal hernia repair      left inguinal   . Cholecystectomy  1994  . Flexible sigmoidoscopy    . Cardiac catheterization    . [place]ary [place]oplasty  1996    [place]  . Esophagogastroduodenoscopy (egd) with propofol Left 11/23/2015    Procedure: ESOPHAGOGASTRODUODENOSCOPY (EGD) WITH PROPOFOL;  Surgeon: [name], MD;  Location: [facility] ENDOSCOPY;  Service: Endoscopy;  Laterality:  Left;    Home Medications:    Medication List       This list is accurate as of: 12/15/15  8:37 AM.  Always use your most recent med list.               carvedilol 6.25 MG tablet  Commonly known as:  COREG  TAKE 1 TABLET (6.25 MG TOTAL) BY MOUTH 2 (TWO) TIMES DAILY WITH A MEAL.     finasteride 5 MG tablet  Commonly known as:  PROSCAR  Take 1 tablet (5 mg total) by mouth daily.     furosemide 40 MG tablet  Commonly known as:  LASIX  Take 2 tablets every am & 1 tablet every pm.     insulin glargine 100 UNIT/ML injection  Commonly known as:  LANTUS  Inject 12 Units into the skin every morning.     losartan 50 MG tablet  Commonly known as:  COZAAR  Take 50 mg by mouth 2 (two) times daily.     metolazone 5 MG tablet  Commonly known as:  ZAROXOLYN  Take 5 mg by mouth as needed. Reported on 12/15/2015     oxyCODONE-acetaminophen 5-325 MG tablet  Commonly known as:  PERCOCET/ROXICET  Take by mouth every 4 (four) hours as needed for severe pain. Reported on 12/15/2015     pantoprazole 40 MG tablet  Commonly known as:  PROTONIX  Take 1 tablet (40 mg total) by mouth 2 (two) times daily.     potassium chloride 20 MEQ packet  Commonly known as:  KLOR-CON  Take 20 mEq by mouth daily.     sitaGLIPtin-metformin 50-1000 MG tablet  Commonly known as:  JANUMET  Take 1 tablet by mouth 2 (two) times daily with a meal.     tamsulosin 0.4 MG Caps capsule  Commonly known as:  FLOMAX  Take 1 capsule (0.4 mg total) by mouth daily.        Allergies:  Allergies  Allergen Reactions  . Exforge [Amlodipine Besylate-Valsartan] Swelling and Other (See Comments)    Pt states that he is unable to swallow.    . Lisinopril Swelling and Other (See Comments)    Pt states that he is unable to swallow.    . Niacin And Related Swelling and Other (See Comments)    Pt states that he is unable to swallow.      Family History: Family History  Problem Relation Age of Onset  . Rheum arthritis  Mother   . Alzheimer's disease Father     Social History:  reports that he has quit smoking. His smoking use included Cigarettes. He has a 50 pack-year smoking history. He does not have any smokeless tobacco history on file. He reports that he does not drink alcohol or use illicit drugs.  ROS:                                        Physical Exam: There were no vitals taken for this visit.  Constitutional:  Alert and oriented, No acute distress. HEENT: [place] AT, moist mucus membranes.  Trachea midline, no masses. Cardiovascular: No clubbing, cyanosis, or edema. Respiratory: Normal respiratory effort, no increased work of breathing. GI: Abdomen is soft, nontender, nondistended, no abdominal masses GU: No CVA tenderness. *** Skin: No rashes, bruises or suspicious lesions. Lymph: No cervical or inguinal adenopathy. Neurologic: Grossly intact, no focal deficits, moving all 4 extremities. Psychiatric: Normal mood and affect.  Laboratory Data: Lab Results  Component Value Date   WBC 6.7 11/25/2015   HGB 8.0* 11/25/2015   HCT 23.9* 11/25/2015   MCV 83.1 11/25/2015   PLT 227 11/25/2015    Lab Results  Component Value Date   CREATININE 1.65* 12/09/2015    No results found for: PSA  No results found for: TESTOSTERONE  No results found for: HGBA1C  Urinalysis No results found for: COLORURINE, APPEARANCEUR, LABSPEC, PHURINE, GLUCOSEU, HGBUR, BILIRUBINUR, KETONESUR, PROTEINUR, UROBILINOGEN, NITRITE, LEUKOCYTESUR  Pertinent Imaging: ***  Assessment & Plan:  ***  There are no diagnoses linked to this encounter.  No Follow-up on file.  Martina Sinner, MD  [facility] [address] [place], [place] [address] [phone]

## 2015-12-15 NOTE — Progress Notes (Signed)
Fill and Pull Catheter Removal  Patient is present today for a catheter removal.  Patient was cleaned and prepped in a sterile fashion 350 ml of sterile water/ saline was instilled into the bladder when the patient felt the urge to urinate. 8 ml of water was then drained from the balloon.  A 16 coude FR foley cath was removed from the bladder complications were noted as: pt failed voiding trial .  Patient as then given some time to void on their own.  Patient cannot void  80 ml on their own after some time.  Patient tolerated well.  Continuous Intermittent Catheterization  Due to urinary retention patient is present today for a teaching of self I & O Catheterization. Patient was given detailed verbal and printed instructions of self catheterization. Patient was cleaned and prepped in a sterile fashion.  With instruction and assistance patient inserted a 16 FR coude cath and urine return was noted , urine was redness  in color. I consulted with Carollee Herter and she stated to have the patient to cath 3 times a day and record his residual. If he's getting lower urine volumes to decrease the times he cath. I also instructed the pt to call the office on  Wednesday and give [place] a report on his urinary status. Patient tolerated well, no complications were noted Patient was given a sample bag with supplies to take home. .. Patient is to follow up w/ Dr.Brandon on 01/09/16.   Preformed by: Georgiann Hahn, CMA  Follow up/ Additional notes: w/ Dr. Apolinar Junes, July 14th

## 2015-12-15 NOTE — Telephone Encounter (Signed)
Pt called complaining that he is having some difficulty with CIC. He stated that he was only able to get out about 40 cc and it was extremely painful w/ gross hematuria. He stated that he would not be able to continue the CIC. I informed him he needed to come back in the office for a PVR and possible getting the catheter reinserted.

## 2016-01-09 ENCOUNTER — Ambulatory Visit (INDEPENDENT_AMBULATORY_CARE_PROVIDER_SITE_OTHER): Payer: Medicare Other | Admitting: Urology

## 2016-01-09 ENCOUNTER — Encounter: Payer: Self-pay | Admitting: Urology

## 2016-01-09 VITALS — BP 176/61 | HR 59 | Ht 65.0 in | Wt 167.7 lb

## 2016-01-09 DIAGNOSIS — R339 Retention of urine, unspecified: Secondary | ICD-10-CM | POA: Diagnosis not present

## 2016-01-09 DIAGNOSIS — N401 Enlarged prostate with lower urinary tract symptoms: Secondary | ICD-10-CM

## 2016-01-09 DIAGNOSIS — N138 Other obstructive and reflux uropathy: Secondary | ICD-10-CM

## 2016-01-09 MED ORDER — TAMSULOSIN HCL 0.4 MG PO CAPS: 0.4000 mg | ORAL_CAPSULE | Freq: Every day | ORAL | Status: DC

## 2016-01-09 NOTE — Progress Notes (Signed)
Fill and Pull Catheter Removal  Patient is present today for a catheter removal.  Patient was cleaned and prepped in a sterile fashion of sterile water/ saline was instilled into the bladder when the patient felt the urge to urinate. 44ml of water was then drained from the balloon.  A 16FR foley cath was removed from the bladder no complications were noted .  Patient as then given some time to void on their own.  Patient can void  on their own after some time.  Patient tolerated well.  Preformed by: Rupert Stacks, LPN

## 2016-01-09 NOTE — Progress Notes (Signed)
1:25 PM  01/09/2016   [name] Jun 23, 1928 [id number]  Referring provider: Lauro Regulus, MD [address] [facility] [place], [place] [address]  Chief Complaint  Patient presents with  . Urinary Retention    HPI:  1 - Enlarged Prostate with Urinary Retention - long h/o obstructive sympotms managed on daily finasteride x years per Dr. Lonna Cobb. New urinary retention during hospitalizaiton for GI bleed 10/2015.  Failed voiding trial 2 since that time. Rectal exam 10/2015 by Dr. Berneice Heinrich enlarged but unremarkable.  Unable to self catheterize. Currently on maximal medical therapy with finasteride and Flomax.  2 - Nephrolithiasis - s/p ureteroscopy x 1 prior. No colic in years.  3 - Stage 3 Chronic Kidney Disease -  Cr 2's x several 2017. Remote renal imaigng w/o hydro. Not currently established with nephrology.  PMH sig for obesity, CAD/Stent/CHF/Lasix/Eliquus (held), follows cards, GI bleed. No chest or abdominal surgeries. His PCP is Einar Crow with [facility].   Today "[name]" returns today to discuss his ongoing retention.   PMH: Past Medical History  Diagnosis Date  . Pure hypercholesterolemia   . Type II or unspecified type diabetes mellitus without mention of complication, uncontrolled   . Hypersomnia with sleep apnea, unspecified   . Other and unspecified hyperlipidemia   . Essential hypertension, benign   . Coronary artery disease   . BPH (benign prostatic hypertrophy)   . Diverticulosis   . MI (myocardial infarction) (HCC)   . GI bleed 2015    Surgical History: Past Surgical History  Procedure Laterality Date  . Tonsillectomy    . Inguinal hernia repair      left inguinal   . Cholecystectomy  1994  . Flexible sigmoidoscopy    . Cardiac catheterization    . Coronary angioplasty  1996    [place]  . Esophagogastroduodenoscopy (egd) with propofol Left 11/23/2015    Procedure: ESOPHAGOGASTRODUODENOSCOPY (EGD) WITH PROPOFOL;   Surgeon: [name], MD;  Location: [facility] ENDOSCOPY;  Service: Endoscopy;  Laterality: Left;    Home Medications:    Medication List       This list is accurate as of: 01/09/16  1:25 PM.  Always use your most recent med list.               carvedilol 6.25 MG tablet  Commonly known as:  COREG  TAKE 1 TABLET (6.25 MG TOTAL) BY MOUTH 2 (TWO) TIMES DAILY WITH A MEAL.     finasteride 5 MG tablet  Commonly known as:  PROSCAR  Take 1 tablet (5 mg total) by mouth daily.     furosemide 40 MG tablet  Commonly known as:  LASIX  Take 2 tablets every am & 1 tablet every pm.     insulin glargine 100 UNIT/ML injection  Commonly known as:  LANTUS  Inject 12 Units into the skin every morning.     losartan 50 MG tablet  Commonly known as:  COZAAR  Take 50 mg by mouth 2 (two) times daily.     metolazone 5 MG tablet  Commonly known as:  ZAROXOLYN  Take 5 mg by mouth as needed. Reported on 12/15/2015     oxyCODONE-acetaminophen 5-325 MG tablet  Commonly known as:  PERCOCET/ROXICET  Take by mouth every 4 (four) hours as needed for severe pain. Reported on 01/09/2016     potassium chloride 20 MEQ packet  Commonly known as:  KLOR-CON  Take 20 mEq by mouth daily.  sitaGLIPtin-metformin 50-1000 MG tablet  Commonly known as:  JANUMET  Take 1 tablet by mouth 2 (two) times daily with a meal.     tamsulosin 0.4 MG Caps capsule  Commonly known as:  FLOMAX  Take 1 capsule (0.4 mg total) by mouth daily.        Allergies:  Allergies  Allergen Reactions  . Exforge [Amlodipine Besylate-Valsartan] Swelling and Other (See Comments)    Pt states that he is unable to swallow.    . Lisinopril Swelling and Other (See Comments)    Pt states that he is unable to swallow.    . Niacin And Related Swelling and Other (See Comments)    Pt states that he is unable to swallow.      Family History: Family History  Problem Relation Age of Onset  . Rheum arthritis Mother   . Alzheimer's disease  Father     Social History:  reports that he has quit smoking. His smoking use included Cigarettes. He has a 50 pack-year smoking history. He does not have any smokeless tobacco history on file. He reports that he does not drink alcohol or use illicit drugs.  ROS: UROLOGY Frequent Urination?: No Hard to postpone urination?: No Burning/pain with urination?: No Get up at night to urinate?: No Leakage of urine?: No Urine stream starts and stops?: No Trouble starting stream?: No Do you have to strain to urinate?: No Blood in urine?: No Urinary tract infection?: No Sexually transmitted disease?: No Injury to kidneys or bladder?: No Painful intercourse?: No Weak stream?: No Erection problems?: No Penile pain?: No  Gastrointestinal Nausea?: No Vomiting?: No Indigestion/heartburn?: No Diarrhea?: No Constipation?: No  Constitutional Fever: No Night sweats?: No Weight loss?: No Fatigue?: No  Skin Skin rash/lesions?: No Itching?: Yes  Eyes Blurred vision?: No Double vision?: No  Ears/Nose/Throat Sore throat?: No Sinus problems?: No  Hematologic/Lymphatic Swollen glands?: No Easy bruising?: No  Cardiovascular Leg swelling?: No Chest pain?: No  Respiratory Cough?: No Shortness of breath?: No  Endocrine Excessive thirst?: No  Musculoskeletal Back pain?: No Joint pain?: No  Neurological Headaches?: No Dizziness?: No  Psychologic Depression?: No Anxiety?: No  Physical Exam: BP 176/61 mmHg  Pulse 59  Ht 5\' 5"  (1.651 m)  Wt 167 lb 11.2 oz (76.068 kg)  BMI 27.91 kg/m2  Constitutional:  Alert and oriented, No acute distress. HEENT: [place] AT, moist mucus membranes.  Trachea midline, no masses. Cardiovascular: No clubbing, cyanosis, or edema. Respiratory: Normal respiratory effort, no increased work of breathing. GI: Abdomen is soft, nontender, nondistended, no abdominal masses. Truncal obesity limits sensitivity.  Skin: No rashes, bruises or suspicious  lesions. Lymph: Significant pedal edema pitting to lower thigh.  Neurologic: Grossly intact, no focal deficits, moving all 4 extremities. Psychiatric: Normal mood and affect.  Laboratory Data: Lab Results  Component Value Date   WBC 6.7 11/25/2015   HGB 8.0* 11/25/2015   HCT 23.9* 11/25/2015   MCV 83.1 11/25/2015   PLT 227 11/25/2015    Lab Results  Component Value Date   CREATININE 1.65* 12/09/2015    Urinalysis No results found for: COLORURINE, APPEARANCEUR, LABSPEC, PHURINE, GLUCOSEU, HGBUR, BILIRUBINUR, KETONESUR, PROTEINUR, UROBILINOGEN, NITRITE, LEUKOCYTESUR  Pertinent Imaging: None recent  Assessment & Plan:    1 - Enlarged Prostate with Urinary Retention - continue finasteride + tamsulosin We discussed options moving forward including repeat voiding trial today. If able to void, he will need to return within the next month for post void residual to ensure that  he is indeed emptying his bladder sufficiently. If he fails a voiding trial again, options moving forward would include an outlet procedure, chronic indwelling Foley catheter, suprapubic tube, or clean intermittent catheterization. He is unable to catheterize himself. He is not interested in any surgical intervention at this time and has previously considered this. He is potentially interested in a suprapubic catheter and we discussed the risk and benefits in detail. I would recommend pursuing this in interventional radiology and slowly upsizing the catheter with serial exchanges if this is deemed necessary.  Trial this morning was ultimately successful. If he develops recurrent retention, will arrange for suprapubic tube catheter placement in the future.    2 - Nephrolithiasis - observe, no addressed today  3 - Stage 3 Chronic Kidney Disease -  likely medical renal exacerbated by CHF / poor perfusion state.  4 - RTC 1 month with PVR.    Return in about 1 month (around 02/09/2016) for PVR,  IPSS.  [name], MD  [facility] [address], [address] [place], [place] [address] [phone]

## 2016-01-11 ENCOUNTER — Emergency Department
Admission: EM | Admit: 2016-01-11 | Discharge: 2016-01-11 | Disposition: A | Discharge status: Home / Self Care | Payer: Medicare Other | Attending: Emergency Medicine | Admitting: Emergency Medicine

## 2016-01-11 DIAGNOSIS — I480 Paroxysmal atrial fibrillation: Secondary | ICD-10-CM | POA: Insufficient documentation

## 2016-01-11 DIAGNOSIS — I739 Peripheral vascular disease, unspecified: Secondary | ICD-10-CM | POA: Diagnosis not present

## 2016-01-11 DIAGNOSIS — I130 Hypertensive heart and chronic kidney disease with heart failure and stage 1 through stage 4 chronic kidney disease, or unspecified chronic kidney disease: Secondary | ICD-10-CM | POA: Diagnosis not present

## 2016-01-11 DIAGNOSIS — Z79899 Other long term (current) drug therapy: Secondary | ICD-10-CM | POA: Diagnosis not present

## 2016-01-11 DIAGNOSIS — R339 Retention of urine, unspecified: Secondary | ICD-10-CM | POA: Diagnosis present

## 2016-01-11 DIAGNOSIS — I2510 Atherosclerotic heart disease of native coronary artery without angina pectoris: Secondary | ICD-10-CM | POA: Diagnosis not present

## 2016-01-11 DIAGNOSIS — I5032 Chronic diastolic (congestive) heart failure: Secondary | ICD-10-CM | POA: Diagnosis not present

## 2016-01-11 DIAGNOSIS — Z794 Long term (current) use of insulin: Secondary | ICD-10-CM | POA: Insufficient documentation

## 2016-01-11 DIAGNOSIS — R8271 Bacteriuria: Secondary | ICD-10-CM

## 2016-01-11 DIAGNOSIS — N320 Bladder-neck obstruction: Secondary | ICD-10-CM | POA: Diagnosis not present

## 2016-01-11 DIAGNOSIS — E1122 Type 2 diabetes mellitus with diabetic chronic kidney disease: Secondary | ICD-10-CM | POA: Diagnosis not present

## 2016-01-11 DIAGNOSIS — N183 Chronic kidney disease, stage 3 (moderate): Secondary | ICD-10-CM | POA: Insufficient documentation

## 2016-01-11 DIAGNOSIS — I252 Old myocardial infarction: Secondary | ICD-10-CM | POA: Insufficient documentation

## 2016-01-11 DIAGNOSIS — E785 Hyperlipidemia, unspecified: Secondary | ICD-10-CM | POA: Insufficient documentation

## 2016-01-11 DIAGNOSIS — Z87891 Personal history of nicotine dependence: Secondary | ICD-10-CM | POA: Diagnosis not present

## 2016-01-11 LAB — BASIC METABOLIC PANEL
ANION GAP: 8 (ref 5–15)
BUN: 25 mg/dL — ABNORMAL HIGH (ref 6–20)
CALCIUM: 8.8 mg/dL — AB (ref 8.9–10.3)
CO2: 26 mmol/L (ref 22–32)
Chloride: 102 mmol/L (ref 101–111)
Creatinine, Ser: 1.41 mg/dL — ABNORMAL HIGH (ref 0.61–1.24)
GFR calc non Af Amer: 44 mL/min — ABNORMAL LOW (ref >60)
GFR, EST AFRICAN AMERICAN: 50 mL/min — AB (ref >60)
GLUCOSE: 160 mg/dL — AB (ref 65–99)
POTASSIUM: 3.5 mmol/L (ref 3.5–5.1)
Sodium: 136 mmol/L (ref 135–145)

## 2016-01-11 LAB — HEMOGLOBIN AND HEMATOCRIT, BLOOD
HEMATOCRIT: 30.3 % — AB (ref 40.0–52.0)
Hemoglobin: 10.1 g/dL — ABNORMAL LOW (ref 13.0–18.0)

## 2016-01-11 LAB — URINALYSIS COMPLETE WITH MICROSCOPIC (ARMC ONLY)
BILIRUBIN URINE: NEGATIVE
GLUCOSE, UA: 50 mg/dL — AB
KETONES UR: NEGATIVE mg/dL
Nitrite: NEGATIVE
PH: 6 (ref 5.0–8.0)
Protein, ur: 100 mg/dL — AB
SQUAMOUS EPITHELIAL / LPF: NONE SEEN
Specific Gravity, Urine: 1.011 (ref 1.005–1.030)

## 2016-01-11 MED ORDER — CEPHALEXIN 250 MG PO CAPS: 250.0000 mg | ORAL_CAPSULE | Freq: Four times a day (QID) | ORAL | Status: AC

## 2016-01-11 MED ORDER — CEPHALEXIN 500 MG PO CAPS
500.0000 mg | ORAL_CAPSULE | Freq: Once | ORAL | Status: AC
  Administered 2016-01-11: 500 mg via ORAL
  Filled 2016-01-11: qty 1

## 2016-01-11 NOTE — ED Notes (Signed)
Foley placed, pericare done, betadine cleaning, no resistence, immediate return of urine.Pt tolerated well. Education provided regarding why foley was placed for urinary retention and abdominal pain.

## 2016-01-11 NOTE — ED Notes (Signed)
Patient d/c with foley

## 2016-01-11 NOTE — ED Notes (Signed)
Patient to ED for urinary retention. Has not voided since Saturday morning. Patient is in significant amount of pain. Assisted patient from vehicle to ER registration and was roomed immediately in room 6. Abdomen is full and distended, painful with palpation.

## 2016-01-11 NOTE — ED Provider Notes (Signed)
[facility] Emergency Department Provider Note  ____________________________________________  Time seen: Approximately 7:52 AM  I have reviewed the triage vital signs and the nursing notes.   HISTORY  Chief Complaint Urinary Retention    HPI [name] is a 82 y.o. male presents for evaluation of difficulty with urinating.  Patient reports that for about the last 2 months she's had to have a Foley catheter in on multiple occasions due to an enlarged prostate. He sees Dr. Apolinar Junes at urology. They removed the catheter on Friday after he is been treated with finasteride and Flomax, however on Saturday he did not urinated all and then began experiencing increasing lower abdominal pain and a very "full bladder" to the point that is causing severe pain this morning. He attempted to self catheterize a few times at home without success, so this caused bleeding into his urine  He had a bladder catheter placed here at the ER, and he reports all of his symptoms are gone. He feels fine now. No fevers chills nausea or vomiting. Denies being in a antibiotics. He has recently been admitted for a bleeding ulcer, but reports no black or bloody stool or ongoing abdominal discomfort.   Past Medical History  Diagnosis Date  . Pure hypercholesterolemia   . Type II or unspecified type diabetes mellitus without mention of complication, uncontrolled   . Hypersomnia with sleep apnea, unspecified   . Other and unspecified hyperlipidemia   . Essential hypertension, benign   . Coronary artery disease   . BPH (benign prostatic hypertrophy)   . Diverticulosis   . MI (myocardial infarction) (HCC)   . GI bleed 2015    Patient Active Problem List   Diagnosis Date Noted  . Nephrolithiasis 12/02/2015  . Chronic progressive renal failure, stage 3 (moderate) 12/02/2015  . Urinary retention 12/02/2015  . Chronic gouty arthritis 09/23/2015  . Morbid obesity (HCC) 04/15/2015  . GI  bleed 02/21/2015  . Peripheral vascular disease (HCC) 10/24/2014  . Frank hematuria 09/30/2014  . Bradycardia 06/17/2014  . Paroxysmal atrial fibrillation (HCC) 06/07/2014  . Arteriosclerosis of coronary artery 01/26/2014  . Diabetes mellitus (HCC) 01/26/2014  . Essential hypertension 12/19/2013  . Cardiomyopathy (HCC) 11/21/2013  . Hypercholesterolemia 11/21/2013  . Obstructive apnea 11/21/2013  . Coronary artery disease 07/27/2013  . Status post coronary angioplasty 07/27/2013  . Diabetes mellitus type 2 with complications (HCC) 07/27/2013  . Atrial fibrillation (HCC) 07/27/2013  . Obesity 07/27/2013  . Hyperlipidemia 07/27/2013  . Chronic diastolic CHF (congestive heart failure) (HCC) 07/27/2013  . Lower GI bleed 07/27/2013  . Elevated prostate specific antigen (PSA) 08/07/2012  . Benign prostatic hyperplasia with urinary obstruction 08/07/2012  . ED (erectile dysfunction) of organic origin 08/07/2012    Past Surgical History  Procedure Laterality Date  . Tonsillectomy    . Inguinal hernia repair      left inguinal   . Cholecystectomy  1994  . Flexible sigmoidoscopy    . Cardiac catheterization    . Coronary angioplasty  1996    [place]  . Esophagogastroduodenoscopy (egd) with propofol Left 11/23/2015    Procedure: ESOPHAGOGASTRODUODENOSCOPY (EGD) WITH PROPOFOL;  Surgeon: [name], MD;  Location: [facility] ENDOSCOPY;  Service: Endoscopy;  Laterality: Left;    Current Outpatient Rx  Name  Route  Sig  Dispense  Refill  . carvedilol (COREG) 6.25 MG tablet      TAKE 1 TABLET (6.25 MG TOTAL) BY MOUTH 2 (TWO) TIMES DAILY WITH A MEAL.  180 tablet   3   . cephALEXin (KEFLEX) 250 MG capsule   Oral   Take 1 capsule (250 mg total) by mouth 4 (four) times daily.   40 capsule   0   . finasteride (PROSCAR) 5 MG tablet   Oral   Take 1 tablet (5 mg total) by mouth daily.   90 tablet   3   . furosemide (LASIX) 40 MG tablet      Take 2 tablets every am & 1 tablet  every pm.         . insulin glargine (LANTUS) 100 UNIT/ML injection   Subcutaneous   Inject 12 Units into the skin every morning.          Marland Kitchen losartan (COZAAR) 50 MG tablet   Oral   Take 50 mg by mouth 2 (two) times daily.       0   . metolazone (ZAROXOLYN) 5 MG tablet   Oral   Take 5 mg by mouth as needed. Reported on 12/15/2015         . oxyCODONE-acetaminophen (PERCOCET/ROXICET) 5-325 MG tablet   Oral   Take by mouth every 4 (four) hours as needed for severe pain. Reported on 01/09/2016         . potassium chloride (KLOR-CON) 20 MEQ packet   Oral   Take 20 mEq by mouth daily.         . sitaGLIPtin-metformin (JANUMET) 50-1000 MG tablet   Oral   Take 1 tablet by mouth 2 (two) times daily with a meal.         . tamsulosin (FLOMAX) 0.4 MG CAPS capsule   Oral   Take 1 capsule (0.4 mg total) by mouth daily.   90 capsule   3     Allergies Exforge; Lisinopril; and Niacin and related  Family History  Problem Relation Age of Onset  . Rheum arthritis Mother   . Alzheimer's disease Father     Social History Social History  Substance Use Topics  . Smoking status: Former Smoker -- 1.00 packs/day for 50 years    Types: Cigarettes  . Smokeless tobacco: None  . Alcohol Use: No    Review of Systems Constitutional: No fever/chills Eyes: No visual changes. ENT: No sore throat. Cardiovascular: Denies chest pain. Respiratory: Denies shortness of breath. Gastrointestinal:  No nausea, no vomiting.  No diarrhea.  No constipation. Genitourinary: See history of present illness. Patient denies any pain now after catheter inserted, he reports that he has not had any abnormal urine odor. He is been eating and drinking normally. Musculoskeletal: Negative for back pain. Skin: Negative for rash. Neurological: Negative for headaches, focal weakness or numbness.  10-point ROS otherwise negative.  ____________________________________________   PHYSICAL EXAM:  VITAL  SIGNS: ED Triage Vitals  Enc Vitals Group     BP 01/11/16 0640 213/92 mmHg     Pulse Rate 01/11/16 0640 72     Resp 01/11/16 0640 22     Temp 01/11/16 0640 98.5 F (36.9 C)     Temp Source 01/11/16 0640 Oral     SpO2 01/11/16 0640 99 %     Weight 01/11/16 0640 165 lb (74.844 kg)     Height 01/11/16 0640 5\' 6"  (1.676 m)     Head Cir --      Peak Flow --      Pain Score 01/11/16 0642 10     Pain Loc --      Pain Edu? --  Excl. in GC? --    Constitutional: Alert and oriented. Well appearing and in no acute distress.Very pleasant, score advise family. Eyes: Conjunctivae are normal. PERRL. EOMI. Head: Atraumatic. Nose: No congestion/rhinnorhea. Mouth/Throat: Mucous membranes are moist.  Oropharynx non-erythematous. Neck: No stridor.   Cardiovascular: Normal rate, regular rhythm. Grossly normal heart sounds.  Good peripheral circulation. Respiratory: Normal respiratory effort.  No retractions. Lungs CTAB. Gastrointestinal: Soft and nontender. No distention. No abdominal bruits. Foley catheter in place, has drained about 900 ML's of urine. Urine is dark with a red tinge to it. No clots. Musculoskeletal: No lower extremity tenderness nor edema.   Neurologic:  Normal speech and language. No gross focal neurologic deficits are appreciated. Skin:  Skin is warm, dry and intact. No rash noted. Psychiatric: Mood and affect are normal. Speech and behavior are normal.  ____________________________________________   LABS (all labs ordered are listed, but only abnormal results are displayed)  Labs Reviewed  URINALYSIS COMPLETEWITH MICROSCOPIC ([facility] ONLY) - Abnormal; Notable for the following:    Color, Urine RED (*)    APPearance CLOUDY (*)    Glucose, UA 50 (*)    Hgb urine dipstick 2+ (*)    Protein, ur 100 (*)    Leukocytes, UA TRACE (*)    Bacteria, UA MANY (*)    All other components within normal limits  BASIC METABOLIC PANEL - Abnormal; Notable for the following:    Glucose,  Bld 160 (*)    BUN 25 (*)    Creatinine, Ser 1.41 (*)    Calcium 8.8 (*)    GFR calc non Af Amer 44 (*)    GFR calc Af Amer 50 (*)    All other components within normal limits  HEMOGLOBIN AND HEMATOCRIT, BLOOD - Abnormal; Notable for the following:    Hemoglobin 10.1 (*)    HCT 30.3 (*)    All other components within normal limits  URINE CULTURE   ____________________________________________  EKG   ____________________________________________  RADIOLOGY   ____________________________________________   PROCEDURES  Procedure(s) performed: None  Critical Care performed: No  ____________________________________________   INITIAL IMPRESSION / ASSESSMENT AND PLAN / ED COURSE  Pertinent labs & imaging results that were available during my care of the patient were reviewed by me and considered in my medical decision making (see chart for details).  Patient presents for evaluation of difficulty voiding. He is found to have bladder outlet and obstruction relieved by Foley insertion. Complete resolution of symptoms after. We'll check basic chemistry and CBC. The patient denies any infectious symptoms. He has follow-up available already 3 urology and he is quite familiar with care for Foley catheters.  ----------------------------------------- 8:38 AM on 01/11/2016 -----------------------------------------  Patient continues to be asymptomatic, Foley catheter drained about additional 100 ML's for urine now. Discussed with him and he is going to call to set up follow-up with urology, is planning to call Dr. Delana Meyer office on Monday to be seen this week. We did discuss his urine results and I told him I cannot be certain but suspect there is possible urinary tract infection starting as are many bacteria noted in his urine specimen. We'll start him on treatment with cephalexin for potential catheter associated UTI, though it is still unclear and a urine culture has been ordered and  pending.  Return precautions and treatment recommendations and follow-up discussed with the patient who is agreeable with the plan.  ____________________________________________   FINAL CLINICAL IMPRESSION(S) / ED DIAGNOSES  Final diagnoses:  Bladder outflow  obstruction  Bacteria in urine      Sharyn Creamer, MD 01/11/16 [phone]

## 2016-01-12 ENCOUNTER — Telehealth: Payer: Self-pay

## 2016-01-12 NOTE — Telephone Encounter (Signed)
Pt came into the office today stating over the weekend he had to go to the ER. Pt stated that he developed the inability to urinate Friday night, attempted to cath 2x, and was unsuccessful. Pt then proceeded stating he attempted CIC 2x Saturday and again was unsuccessful. Therefore he went to the ER to have a foley placed. Pt stated ER physician started him on an abx due to infection. Pt stated that he did not like the night bag he received from the ER and wanted a leg bag. Gave pt a leg bag. Reinforced with pt how to exchange bags and reinforced the need for the night bag at bedtime. Pt voiced understanding of whole conversation. Pt has a f/u appt Wednesday with MD.

## 2016-01-13 LAB — URINE CULTURE
Culture: >=100000 — AB
Special Requests: NORMAL

## 2016-01-14 ENCOUNTER — Telehealth: Payer: Self-pay | Admitting: Pharmacist

## 2016-01-14 ENCOUNTER — Ambulatory Visit: Payer: Medicare Other | Admitting: Urology

## 2016-01-14 NOTE — Telephone Encounter (Signed)
Called pt informed him of cx results. Told him to stop taking cephalexin as it is does not cover bacteria growing. Pt states he uses [facility] on [address] in [place]. Rx for doxycycline 100mg  BID x 7 days called into pharmacy. Rx authorized by Dr. Darnelle Catalan.   Olene Floss, Pharm.D Clinical Pharmacist

## 2016-01-16 ENCOUNTER — Ambulatory Visit (INDEPENDENT_AMBULATORY_CARE_PROVIDER_SITE_OTHER): Payer: Medicare Other | Admitting: Urology

## 2016-01-16 ENCOUNTER — Encounter: Payer: Self-pay | Admitting: Urology

## 2016-01-16 VITALS — BP 166/67 | HR 58 | Ht 66.0 in | Wt 165.6 lb

## 2016-01-16 DIAGNOSIS — N400 Enlarged prostate without lower urinary tract symptoms: Secondary | ICD-10-CM | POA: Diagnosis not present

## 2016-01-16 DIAGNOSIS — N200 Calculus of kidney: Secondary | ICD-10-CM | POA: Diagnosis not present

## 2016-01-16 NOTE — Progress Notes (Signed)
01/16/2016 1:26 PM   [name] September 26, 1927 [id number]  Referring provider: Lauro Regulus, MD [address] [facility] [place], [place] [address]  No chief complaint on file.   HPI:  1 - Enlarged Prostate with Urinary Retention - long h/o obstructive sympotms managed on daily finasteride x years per Dr. Lonna Cobb. New urinary retention during hospitalizaiton for GI bleed 10/2015. Failed voiding trial 2 since that time. Rectal exam 10/2015 by Dr. Berneice Heinrich enlarged but unremarkable. Unable to self catheterize. Currently on maximal medical therapy with finasteride and Flomax.  He has previously been on intermittent catheterization failed trial of void. He went to the ER recently and a Foley catheter was placed. He has not been interested in surgery in the past.  2 - Nephrolithiasis - s/p ureteroscopy x 1 prior. No colic in years.  3 - Stage 3 Chronic Kidney Disease - Cr 2's x several 2017. Remote renal imaigng w/o hydro. Not currently established with nephrology.  PMH sig for obesity, CAD/Stent/CHF/Lasix/Eliquus (held), follows cards, GI bleed. No chest or abdominal surgeries. His PCP is Einar Crow with [facility].   Today "[name]" returns today to discuss his ongoing retention.     PMH: Past Medical History  Diagnosis Date  . Pure hypercholesterolemia   . Type II or unspecified type diabetes mellitus without mention of complication, uncontrolled   . Hypersomnia with sleep apnea, unspecified   . Other and unspecified hyperlipidemia   . Essential hypertension, benign   . Coronary artery disease   . BPH (benign prostatic hypertrophy)   . Diverticulosis   . MI (myocardial infarction) (HCC)   . GI bleed 2015    Surgical History: Past Surgical History  Procedure Laterality Date  . Tonsillectomy    . Inguinal hernia repair      left inguinal   . Cholecystectomy  1994  . Flexible sigmoidoscopy    . Cardiac catheterization    . [place]ary  angioplasty  1996    [place]  . Esophagogastroduodenoscopy (egd) with propofol Left 11/23/2015    Procedure: ESOPHAGOGASTRODUODENOSCOPY (EGD) WITH PROPOFOL;  Surgeon: [name], MD;  Location: [facility] ENDOSCOPY;  Service: Endoscopy;  Laterality: Left;    Home Medications:    Medication List       This list is accurate as of: 01/16/16  1:26 PM.  Always use your most recent med list.               carvedilol 6.25 MG tablet  Commonly known as:  COREG  TAKE 1 TABLET (6.25 MG TOTAL) BY MOUTH 2 (TWO) TIMES DAILY WITH A MEAL.     cephALEXin 250 MG capsule  Commonly known as:  KEFLEX  Take 1 capsule (250 mg total) by mouth 4 (four) times daily.     doxycycline 100 MG capsule  Commonly known as:  VIBRAMYCIN  take 1 capsule by mouth twice a day for 7 days     finasteride 5 MG tablet  Commonly known as:  PROSCAR  Take 1 tablet (5 mg total) by mouth daily.     furosemide 40 MG tablet  Commonly known as:  LASIX  Take 2 tablets every am & 1 tablet every pm.     insulin glargine 100 UNIT/ML injection  Commonly known as:  LANTUS  Inject 12 Units into the skin every morning.     losartan 50 MG tablet  Commonly known as:  COZAAR  Take 50 mg by mouth 2 (two) times daily.  metolazone 5 MG tablet  Commonly known as:  ZAROXOLYN  Take 5 mg by mouth as needed. Reported on 12/15/2015     oxyCODONE-acetaminophen 5-325 MG tablet  Commonly known as:  PERCOCET/ROXICET  Take by mouth every 4 (four) hours as needed for severe pain. Reported on 01/09/2016     potassium chloride 20 MEQ packet  Commonly known as:  KLOR-CON  Take 20 mEq by mouth daily.     sitaGLIPtin-metformin 50-1000 MG tablet  Commonly known as:  JANUMET  Take 1 tablet by mouth 2 (two) times daily with a meal.     tamsulosin 0.4 MG Caps capsule  Commonly known as:  FLOMAX  Take 1 capsule (0.4 mg total) by mouth daily.        Allergies:  Allergies  Allergen Reactions  . Exforge [Amlodipine  Besylate-Valsartan] Swelling and Other (See Comments)    Pt states that he is unable to swallow.    . Lisinopril Swelling and Other (See Comments)    Pt states that he is unable to swallow.    . Niacin And Related Swelling and Other (See Comments)    Pt states that he is unable to swallow.      Family History: Family History  Problem Relation Age of Onset  . Rheum arthritis Mother   . Alzheimer's disease Father     Social History:  reports that he has quit smoking. His smoking use included Cigarettes. He has a 50 pack-year smoking history. He does not have any smokeless tobacco history on file. He reports that he does not drink alcohol or use illicit drugs.  ROS: UROLOGY Frequent Urination?: No Hard to postpone urination?: No Burning/pain with urination?: No Get up at night to urinate?: No Leakage of urine?: No Urine stream starts and stops?: No Trouble starting stream?: No Do you have to strain to urinate?: No Blood in urine?: No Urinary tract infection?: No Sexually transmitted disease?: No Injury to kidneys or bladder?: No Painful intercourse?: No Weak stream?: No Erection problems?: No Penile pain?: No  Gastrointestinal Nausea?: No Vomiting?: No Indigestion/heartburn?: No Diarrhea?: No Constipation?: No  Constitutional Fever: No Night sweats?: No Weight loss?: No Fatigue?: No  Skin Skin rash/lesions?: No Itching?: No  Eyes Blurred vision?: No Double vision?: No  Ears/Nose/Throat Sore throat?: No Sinus problems?: No  Hematologic/Lymphatic Swollen glands?: No Easy bruising?: No  Cardiovascular Leg swelling?: No Chest pain?: No  Respiratory Cough?: No Shortness of breath?: No  Endocrine Excessive thirst?: No  Musculoskeletal Back pain?: No Joint pain?: No  Neurological Headaches?: No Dizziness?: No  Psychologic Depression?: No Anxiety?: No  Physical Exam: BP 166/67 mmHg  Pulse 58  Ht  (1.676 m)  Wt 165 lb 9.6 oz (75.116  kg)  BMI 26.74 kg/m2  Constitutional:  Alert and oriented, No acute distress. HEENT: [place] AT, moist mucus membranes.  Trachea midline, no masses. Cardiovascular: No clubbing, cyanosis, or edema. Respiratory: Normal respiratory effort, no increased work of breathing. GI: Abdomen is soft, nontender, nondistended, no abdominal masses GU: No CVA tenderness.  Skin: No rashes, bruises or suspicious lesions. Lymph: No cervical or inguinal adenopathy. Neurologic: Grossly intact, no focal deficits, moving all 4 extremities. Psychiatric: Normal mood and affect.  Laboratory Data: Lab Results  Component Value Date   WBC 6.7 11/25/2015   HGB 10.1* 01/11/2016   HCT 30.3* 01/11/2016   MCV 83.1 11/25/2015   PLT 227 11/25/2015    Lab Results  Component Value Date   CREATININE 1.41* 01/11/2016  No results found for: PSA  No results found for: TESTOSTERONE  No results found for: HGBA1C  Urinalysis    Component Value Date/Time   COLORURINE RED* 01/11/2016 0700   APPEARANCEUR CLOUDY* 01/11/2016 0700   LABSPEC 1.011 01/11/2016 0700   PHURINE 6.0 01/11/2016 0700   GLUCOSEU 50* 01/11/2016 0700   HGBUR 2+* 01/11/2016 0700   BILIRUBINUR NEGATIVE 01/11/2016 0700   KETONESUR NEGATIVE 01/11/2016 0700   PROTEINUR 100* 01/11/2016 0700   NITRITE NEGATIVE 01/11/2016 0700   LEUKOCYTESUR TRACE* 01/11/2016 0700     Assessment & Plan:   1 - Enlarged Prostate with Urinary Retention - continue finasteride + tamsulosin The patient will continue his Foley catheter for now. We had a long discussion today about options include Flomax, SP tube, and surgical correction. We did discuss his need for urodynamics prior to her surgical intervention which could be a TURP or greenlight laser ablation. He is not interested in surgery at this time. He will think about it further. For now we'll keep him on monthly catheter exchanges. If he elects to undergo surgery he will schedule provider appointment.  2 -  Nephrolithiasis - observe, no addressed today  3 - Stage 3 Chronic Kidney Disease - likely medical renal exacerbated by CHF / poor perfusion state.    Return in about 4 weeks (around 02/13/2016) for nurse visit for foley exchange.  Hildred Laser, MD  [facility] [address] [place], [place] [address] [phone]

## 2016-01-19 ENCOUNTER — Telehealth: Payer: Self-pay

## 2016-01-19 NOTE — Telephone Encounter (Signed)
Pt called stating he has seen blood in his catheter leg bag over the weekend. Reinforced with pt to drink plenty of water. Reinforced with pt the bleeding is ok and should call back if the foley stops draining. Pt stated that the foley has been draining fine but was concerned about the blood. Pt voiced understanding of whole conversation.

## 2016-02-13 ENCOUNTER — Ambulatory Visit: Payer: Medicare Other | Admitting: Urology

## 2016-02-16 ENCOUNTER — Ambulatory Visit (INDEPENDENT_AMBULATORY_CARE_PROVIDER_SITE_OTHER): Payer: Medicare Other

## 2016-02-16 VITALS — BP 160/60 | HR 65 | Ht 66.0 in | Wt 165.0 lb

## 2016-02-16 DIAGNOSIS — R339 Retention of urine, unspecified: Secondary | ICD-10-CM | POA: Diagnosis not present

## 2016-02-16 NOTE — Progress Notes (Signed)
Cath Change/ Replacement  Patient is present today for a catheter change due to urinary retention.  24ml of water was removed from the balloon, a 16FR foley cath was removed with out difficulty.  Patient was cleaned and prepped in a sterile fashion with betadine and 2% lidocaine jelly was instilled into the urethra. A 16 FR foley cath was replaced into the bladder no complications were noted Urine return was noted 26ml and urine was yellow in color. The balloon was filled with 39ml of sterile water. A leg bag was attached for drainage.  A night bag was also given to the patient and patient was given instruction on how to change from one bag to another. Patient was given proper instruction on catheter care.    Preformed by: Rupert Stacks, LPN   Follow up: 53mo for foley exchange

## 2016-02-22 ENCOUNTER — Other Ambulatory Visit: Payer: Self-pay | Admitting: Cardiovascular Disease

## 2016-02-26 ENCOUNTER — Telehealth: Payer: Self-pay

## 2016-02-26 NOTE — Telephone Encounter (Signed)
Nurse got pt appt at [facility] with Dr. Lonna Cobb September 18 at 1:00pm. Pt has been made aware.

## 2016-02-27 ENCOUNTER — Telehealth: Payer: Self-pay | Admitting: Cardiovascular Disease

## 2016-02-27 NOTE — Telephone Encounter (Signed)
Patient states that he had received a letter to call and schedule his follow up visit. He states that when he called they told him it would be December before he could get in and he told them that it was unacceptable because he was having problems. He has had a catheter and just needed to be seen in October like his letter stated. Appointment was scheduled and he said he had no further needs at this time. Instructed him to please call back if he has any further problems prior to his appointment and he verbalized understanding with no further questions.

## 2016-02-27 NOTE — Telephone Encounter (Signed)
Pt c/o swelling: STAT is pt has developed SOB within 24 hours  1. How long have you been experiencing swelling? 3 MONTHS, SINCE HE HAS HAD HIS CATHETER  2. Where is the swelling located? R LEG, TOP OF ANKLE TO BELOW THE KNEE  3.  Are you currently taking a "fluid pill"?YES  4.  Are you currently SOB? NO  5.  Have you traveled recently?NO

## 2016-03-15 ENCOUNTER — Ambulatory Visit: Payer: Medicare Other

## 2016-03-16 ENCOUNTER — Ambulatory Visit (INDEPENDENT_AMBULATORY_CARE_PROVIDER_SITE_OTHER): Payer: Medicare Other | Admitting: Cardiovascular Disease

## 2016-03-16 ENCOUNTER — Ambulatory Visit: Payer: Self-pay

## 2016-03-16 ENCOUNTER — Encounter: Payer: Self-pay | Admitting: Cardiovascular Disease

## 2016-03-16 VITALS — BP 140/60 | HR 63 | Ht 65.0 in | Wt 165.8 lb

## 2016-03-16 DIAGNOSIS — I429 Cardiomyopathy, unspecified: Secondary | ICD-10-CM

## 2016-03-16 DIAGNOSIS — R001 Bradycardia, unspecified: Secondary | ICD-10-CM

## 2016-03-16 DIAGNOSIS — I4819 Other persistent atrial fibrillation: Secondary | ICD-10-CM

## 2016-03-16 DIAGNOSIS — I10 Essential (primary) hypertension: Secondary | ICD-10-CM

## 2016-03-16 DIAGNOSIS — I5032 Chronic diastolic (congestive) heart failure: Secondary | ICD-10-CM

## 2016-03-16 DIAGNOSIS — E669 Obesity, unspecified: Secondary | ICD-10-CM

## 2016-03-16 DIAGNOSIS — R339 Retention of urine, unspecified: Secondary | ICD-10-CM

## 2016-03-16 DIAGNOSIS — E785 Hyperlipidemia, unspecified: Secondary | ICD-10-CM | POA: Diagnosis not present

## 2016-03-16 DIAGNOSIS — E118 Type 2 diabetes mellitus with unspecified complications: Secondary | ICD-10-CM

## 2016-03-16 DIAGNOSIS — I481 Persistent atrial fibrillation: Secondary | ICD-10-CM | POA: Diagnosis not present

## 2016-03-16 DIAGNOSIS — K922 Gastrointestinal hemorrhage, unspecified: Secondary | ICD-10-CM

## 2016-03-16 NOTE — Patient Instructions (Addendum)
Medication Instructions:   Skip the furosemide and potassium  on Sunday   Consider iron pill one  A day  Monitor your blood pressures at home and take your numbers to Dr.Anderson  Labwork:  No new labs needed  Testing/Procedures:  No further testing at this time   Follow-Up: It was a pleasure seeing you in the office today. Please call us if you have new issues that need to be addressed before your next appt.  [phone]  Your physician wants you to follow-up in: 6 months.  You will receive a reminder letter in the mail two months in advance. If you don't receive a letter, please call our office to schedule the follow-up appointment.  If you need a refill on your cardiac medications before your next appointment, please call your pharmacy.

## 2016-03-16 NOTE — Progress Notes (Signed)
Cardiology Office Note  Date:  03/16/2016   ID:  [name], DOB 15-Jun-1928, MRN [id number]  PCP:  Lauro Regulus., MD   Chief Complaint  Patient presents with  . other    Dr. Dareen Piano would like the patient evaluated for abnormal EKG with decreased HR. Meds reviewed by the pt. verbally.     HPI:  [name] is a 82 year old gentleman presents for routine followup of his atrial fibrillation and GI bleeding Baseline creatinine 1.4 history of CAD, angioplasty in August 1996. PTCA of a diagonal branch and OM in 1996 He has obstructive sleep apnea, not on CPAP hospitalization 07/07/2013 for diverticular bleed, atrial fibrillation. He was given antibiotics for diverticulitis. Seen by Dr. Mechele Collin. No colonoscopy was performed. He was not started on anticoagulation by cardiology given his GI bleeding and anemia. As an outpatient, he was started  on eliquis 2.5 mgrams twice a day and was stable Anticoagulation held for hospital admission 11/22/2015 for profound anemia, hemoglobin 4.1  In follow-up, he reports that carvedilol was recently held for bradycardia EKG shows heart rate 41 bpm Without carvedilol, heart rate now up into the 60s Was seen by urology yesterday, pulse rate 60s EKG on today's visit shows atrial flutter with ventricular rate 64 bpm, nonspecific ST abnormality  He is not monitoring his blood pressure at home Reports he feels well with no complaints apart from some gout in his right wrist He has not been taking his colchicine, only taking a pain pill Previously seen by orthopedics in early 2017 and provided prednisone, colchicine, pain pill  Recent BMP shows climb in creatinine up to 1.7, elevated BUN Not taking metolazone  He continues to take Lasix 80 mg in the morning, 40 mg after lunch, Denies any significant leg edema He stopped iron pill on his own  Other past medical history reviewed  Previous hospitalization May 2017 for anemia,  Required  transfusion 4 in the hospital, all blood thinners were held nuclear medicine GI bleeding scan which was negative for GI bleed.  Other hospital complications include urine retention causing hydronephrosis, renal failure with creatinine greater than 2. He required Foley catheter placement   Not wearing compression hose or leg wraps  Previously had problems on Lipitor, muscle ache. Also recently stopped lovastatin. Currently not on any cholesterol medication Previous blood showing creatinine 1.6, BUN 29, hemoglobin A1c 7.6 Carvedilol previously decreased for bradycardia.   dizziness while playing golf 05/16/2013. He felt he was having the flu. Seen by PMD and given prednisone with antibiotics.  Developed blood per rectum in January 2015 . He was anemic in the hospital, hemoglobin in the 9 range  echocardiogram 06/26/2013 showing ejection fraction 45-50%, right ventricular systolic pressure 68 mmHg. He was instructed to increase his torsemide to 40 mg alternating with 20 mg Repeat echocardiogram July 08 2013 showed right ventricular systolic pressure 49, normal ejection fraction  normal stress test March 2009, normal ejection fraction by echocardiogram March 2009 He had a PTCA of a diagonal branch and OM in 1996  PMH:   has a past medical history of BPH (benign prostatic hypertrophy); Coronary artery disease; Diverticulosis; Essential hypertension, benign; GI bleed (2015); Hypersomnia with sleep apnea, unspecified; MI (myocardial infarction) (HCC); Other and unspecified hyperlipidemia; Pure hypercholesterolemia; and Type II or unspecified type diabetes mellitus without mention of complication, uncontrolled.  PSH:    Past Surgical History:  Procedure Laterality Date  . CARDIAC CATHETERIZATION    . CHOLECYSTECTOMY  1994  . [place]  [place]  . ESOPHAGOGASTRODUODENOSCOPY (EGD) WITH PROPOFOL Left 11/23/2015   Procedure: ESOPHAGOGASTRODUODENOSCOPY (EGD) WITH  PROPOFOL;  Surgeon: Colette RibasEdward L Barnes, MD;  Location[facility] ENDOSCOPY;  Service: Endoscopy;  Laterality: Left;  . FLEXIBLE SIGMOIDOSCOPY    . inguinal hernia repair     left inguinal   . TONSILLECTOMY      Current Outpatient Prescriptions  Medication Sig Dispense Refill  . Coenzyme Q10 (CO Q 10) 10 MG CAPS Take by mouth.    . finasteride (PROSCAR) 5 MG tablet Take 1 tablet (5 mg total) by mouth daily. 90 tablet 3  . furosemide (LASIX) 40 MG tablet Take 2 tablets every am & 1 tablet every pm.    . glimepiride (AMARYL) 1 MG tablet Take by mouth daily with breakfast.     . Insulin Glargine (LANTUS SOLOSTAR) 100 UNIT/ML Solostar Pen Inject into the skin.    Marland Kitchen. losartan (COZAAR) 50 MG tablet take 1 tablet by mouth once daily 90 tablet 3  . MAGNESIUM PO Take by mouth.    . metolazone (ZAROXOLYN) 5 MG tablet Take 5 mg by mouth as needed. Reported on 12/15/2015    . oxyCODONE-acetaminophen (PERCOCET/ROXICET) 5-325 MG tablet Take by mouth every 4 (four) hours as needed for severe pain. Reported on 01/09/2016    . potassium chloride (KLOR-CON) 20 MEQ packet Take 20 mEq by mouth daily.    . sitaGLIPtin-metformin (JANUMET) 50-1000 MG tablet Take 1 tablet by mouth 2 (two) times daily with a meal.    . tamsulosin (FLOMAX) 0.4 MG CAPS capsule Take 1 capsule (0.4 mg total) by mouth daily. 90 capsule 3   No current facility-administered medications for this visit.      Allergies:   Exforge [amlodipine besylate-valsartan]; Lisinopril; and Niacin and related   Social History:  The patient  reports that he has quit smoking. His smoking use included Cigarettes. He has a 50.00 pack-year smoking history. He has never used smokeless tobacco. He reports that he does not drink alcohol or use drugs.   Family History:   family history includes Alzheimer's disease in his father; Rheum arthritis in his mother.    Review of Systems: Review of Systems  Constitutional: Positive for weight loss.  Respiratory: Negative.    Cardiovascular: Negative.   Gastrointestinal: Negative.   Genitourinary:       Urine retention  Musculoskeletal: Negative.   Neurological: Negative.   Psychiatric/Behavioral: Negative.   All other systems reviewed and are negative.    PHYSICAL EXAM: VS:  BP 140/60 (BP Location: Left Arm, Patient Position: Sitting, Cuff Size: Normal)   Pulse 63   Ht 5\' 5"  (1.651 m)   Wt 165 lb 12 oz (75.2 kg)   BMI 27.58 kg/m  , BMI Body mass index is 27.58 kg/m. GEN: Well nourished, well developed, in no acute distress, obese  HEENT: normal  Neck: no JVD, carotid bruits, or masses Cardiac: RRR; no murmurs, rubs, or gallops,no edema  Respiratory:  clear to auscultation bilaterally, normal work of breathing GI: soft, nontender, nondistended, + BS MS: no deformity or atrophy  Skin: warm and dry, no rash Neuro:  Strength and sensation are intact Psych: euthymic mood, full affect    Recent Labs: 11/22/2015: ALT 16 11/25/2015: Platelets 227 01/11/2016: BUN 25; Creatinine, Ser 1.41; Hemoglobin 10.1; Potassium 3.5; Sodium 136    Lipid Panel No results found for: CHOL, HDL, LDLCALC, TRIG    Wt Readings from Last 3 Encounters:  03/16/16 165 lb 12 oz (75.2 kg)  02/16/16 165 lb (74.8 kg)  01/16/16 165 lb 9.6 oz (75.1 kg)       ASSESSMENT AND PLAN:  Persistent atrial fibrillation (HCC) - Plan: EKG 12-Lead Rate relatively well-controlled, bradycardia resolved by holding beta blocker  Cardiomyopathy (HCC) - Plan: EKG 12-Lead Appears relatively euvolemic Beta blocker held, tolerating ARB Recommended he cut back on his diuretics  Chronic diastolic CHF (congestive heart failure) (HCC) - Plan: EKG 12-Lead Recent climb in BUN and creatinine. He is not taking metolazone as he has no leg edema Encouraged him to skip diuretics on Sundays Alternatively he could hold the afternoon Lasix, stay on his current dose in the mornings  Essential hypertension Blood pressure adequate today though  likely in the setting of prerenal state Recommended he monitor blood pressure at home and bring these numbers to Dr. Dareen Piano next month. If blood pressure medications needed, would afford calcium channel blockers given chronic leg swelling, would avoid clonidine given side effect of bradycardia, would probably avoid increasing dose of losartan given underlying renal dysfunction. Options include hydralazine, isosorbide.   Type 2 diabetes mellitus with complication, without long-term current use of insulin (HCC) We have encouraged continued exercise, careful diet management in an effort to lose weight.  Obesity Recent 13 pound weight loss  Lower GI bleed Currently not on anticoagulation given previous history of GI bleed Risk of stroke discussed with him in the past  Bradycardia Bradycardia resolved by holding carvedilol Currently asymptomatic, will monitor for now No indication for pacemaker  Urinary retention He would be acceptable risk for urologic surgery either a TURP or laser treatment. He is bothered by chronic indwelling Foley catheter and is considering surgery.   Total encounter time more than 25 minutes  Greater than 50% was spent in counseling and coordination of care with the patient   Disposition:   F/U  6 months   Orders Placed This Encounter  Procedures  . EKG 12-Lead     Signed, Dossie Arbour, M.D., Ph.D. 03/16/2016  [facility] [place], [place] [phone]

## 2016-03-17 ENCOUNTER — Ambulatory Visit (INDEPENDENT_AMBULATORY_CARE_PROVIDER_SITE_OTHER): Payer: Medicare Other

## 2016-03-17 VITALS — BP 170/75 | HR 92 | Ht 65.0 in | Wt 167.2 lb

## 2016-03-17 DIAGNOSIS — R339 Retention of urine, unspecified: Secondary | ICD-10-CM

## 2016-03-17 NOTE — Progress Notes (Signed)
Cath Change/ Replacement  Patient is present today for a catheter change due to urinary retention.  19ml of water was removed from the balloon, a 16FR foley cath was removed with out difficulty.  Patient was cleaned and prepped in a sterile fashion with betadine and 2% lidocaine jelly was instilled into the urethra. A 16 FR foley cath was replaced into the bladder no complications were noted Urine return was noted 98ml and urine was yellow in color. The balloon was filled with 7ml of sterile water. A leg bag was attached for drainage.  A night bag was also given to the patient and patient was given instruction on how to change from one bag to another. Patient was given proper instruction on catheter care.    Preformed by: Rupert Stacks, LPN   Follow up: Per pt he went to see Dr. Lonna Cobb for a second opinion. Per pt Dr. Lonna Cobb recommended pt take flomax bid x23mo and then do another voiding trial. Therefore pt made a 36mo f/u with Dr. Apolinar Junes to discuss the second opinion and possible surgery.  Blood pressure (!) 170/75, pulse 92, height 5\' 5"  (1.651 m), weight 167 lb 3.2 oz (75.8 kg).

## 2016-04-12 ENCOUNTER — Emergency Department: Payer: Medicare Other

## 2016-04-12 ENCOUNTER — Inpatient Hospital Stay
Admission: EM | Admit: 2016-04-12 | Discharge: 2016-04-16 | DRG: 871 | Disposition: A | Discharge status: Home Health | Payer: Medicare Other | Attending: Internal Medicine | Admitting: Internal Medicine

## 2016-04-12 ENCOUNTER — Encounter: Payer: Self-pay | Admitting: Emergency Medicine

## 2016-04-12 ENCOUNTER — Inpatient Hospital Stay: Payer: Medicare Other

## 2016-04-12 DIAGNOSIS — J181 Lobar pneumonia, unspecified organism: Secondary | ICD-10-CM

## 2016-04-12 DIAGNOSIS — R7989 Other specified abnormal findings of blood chemistry: Secondary | ICD-10-CM

## 2016-04-12 DIAGNOSIS — I248 Other forms of acute ischemic heart disease: Secondary | ICD-10-CM | POA: Diagnosis present

## 2016-04-12 DIAGNOSIS — R6520 Severe sepsis without septic shock: Secondary | ICD-10-CM | POA: Diagnosis not present

## 2016-04-12 DIAGNOSIS — R778 Other specified abnormalities of plasma proteins: Secondary | ICD-10-CM

## 2016-04-12 DIAGNOSIS — G4710 Hypersomnia, unspecified: Secondary | ICD-10-CM | POA: Diagnosis present

## 2016-04-12 DIAGNOSIS — R2689 Other abnormalities of gait and mobility: Secondary | ICD-10-CM

## 2016-04-12 DIAGNOSIS — N401 Enlarged prostate with lower urinary tract symptoms: Secondary | ICD-10-CM | POA: Diagnosis present

## 2016-04-12 DIAGNOSIS — E669 Obesity, unspecified: Secondary | ICD-10-CM | POA: Diagnosis present

## 2016-04-12 DIAGNOSIS — G9341 Metabolic encephalopathy: Secondary | ICD-10-CM | POA: Diagnosis present

## 2016-04-12 DIAGNOSIS — N132 Hydronephrosis with renal and ureteral calculous obstruction: Secondary | ICD-10-CM | POA: Diagnosis not present

## 2016-04-12 DIAGNOSIS — A4159 Other Gram-negative sepsis: Principal | ICD-10-CM | POA: Diagnosis present

## 2016-04-12 DIAGNOSIS — N183 Chronic kidney disease, stage 3 (moderate): Secondary | ICD-10-CM | POA: Diagnosis present

## 2016-04-12 DIAGNOSIS — T83511A Infection and inflammatory reaction due to indwelling urethral catheter, initial encounter: Secondary | ICD-10-CM | POA: Diagnosis present

## 2016-04-12 DIAGNOSIS — Z79899 Other long term (current) drug therapy: Secondary | ICD-10-CM

## 2016-04-12 DIAGNOSIS — R262 Difficulty in walking, not elsewhere classified: Secondary | ICD-10-CM

## 2016-04-12 DIAGNOSIS — J189 Pneumonia, unspecified organism: Secondary | ICD-10-CM | POA: Diagnosis present

## 2016-04-12 DIAGNOSIS — A419 Sepsis, unspecified organism: Secondary | ICD-10-CM | POA: Diagnosis not present

## 2016-04-12 DIAGNOSIS — N390 Urinary tract infection, site not specified: Secondary | ICD-10-CM | POA: Diagnosis not present

## 2016-04-12 DIAGNOSIS — Y846 Urinary catheterization as the cause of abnormal reaction of the patient, or of later complication, without mention of misadventure at the time of the procedure: Secondary | ICD-10-CM | POA: Diagnosis present

## 2016-04-12 DIAGNOSIS — J9811 Atelectasis: Secondary | ICD-10-CM | POA: Diagnosis present

## 2016-04-12 DIAGNOSIS — Z9049 Acquired absence of other specified parts of digestive tract: Secondary | ICD-10-CM | POA: Diagnosis not present

## 2016-04-12 DIAGNOSIS — I252 Old myocardial infarction: Secondary | ICD-10-CM

## 2016-04-12 DIAGNOSIS — M109 Gout, unspecified: Secondary | ICD-10-CM | POA: Diagnosis present

## 2016-04-12 DIAGNOSIS — E785 Hyperlipidemia, unspecified: Secondary | ICD-10-CM | POA: Diagnosis present

## 2016-04-12 DIAGNOSIS — R109 Unspecified abdominal pain: Secondary | ICD-10-CM | POA: Diagnosis not present

## 2016-04-12 DIAGNOSIS — I420 Dilated cardiomyopathy: Secondary | ICD-10-CM | POA: Diagnosis present

## 2016-04-12 DIAGNOSIS — Z87891 Personal history of nicotine dependence: Secondary | ICD-10-CM

## 2016-04-12 DIAGNOSIS — R748 Abnormal levels of other serum enzymes: Secondary | ICD-10-CM | POA: Diagnosis not present

## 2016-04-12 DIAGNOSIS — R338 Other retention of urine: Secondary | ICD-10-CM | POA: Diagnosis present

## 2016-04-12 DIAGNOSIS — E1122 Type 2 diabetes mellitus with diabetic chronic kidney disease: Secondary | ICD-10-CM | POA: Diagnosis present

## 2016-04-12 DIAGNOSIS — I5042 Chronic combined systolic (congestive) and diastolic (congestive) heart failure: Secondary | ICD-10-CM | POA: Diagnosis present

## 2016-04-12 DIAGNOSIS — Z9861 Coronary angioplasty status: Secondary | ICD-10-CM | POA: Diagnosis not present

## 2016-04-12 DIAGNOSIS — E872 Acidosis, unspecified: Secondary | ICD-10-CM

## 2016-04-12 DIAGNOSIS — R1032 Left lower quadrant pain: Secondary | ICD-10-CM | POA: Diagnosis not present

## 2016-04-12 DIAGNOSIS — N1330 Unspecified hydronephrosis: Secondary | ICD-10-CM

## 2016-04-12 DIAGNOSIS — N10 Acute tubulo-interstitial nephritis: Secondary | ICD-10-CM | POA: Diagnosis present

## 2016-04-12 DIAGNOSIS — I2510 Atherosclerotic heart disease of native coronary artery without angina pectoris: Secondary | ICD-10-CM | POA: Diagnosis present

## 2016-04-12 DIAGNOSIS — D72829 Elevated white blood cell count, unspecified: Secondary | ICD-10-CM

## 2016-04-12 DIAGNOSIS — Z794 Long term (current) use of insulin: Secondary | ICD-10-CM

## 2016-04-12 DIAGNOSIS — I952 Hypotension due to drugs: Secondary | ICD-10-CM | POA: Diagnosis not present

## 2016-04-12 DIAGNOSIS — I480 Paroxysmal atrial fibrillation: Secondary | ICD-10-CM | POA: Diagnosis present

## 2016-04-12 DIAGNOSIS — T465X5A Adverse effect of other antihypertensive drugs, initial encounter: Secondary | ICD-10-CM | POA: Diagnosis not present

## 2016-04-12 DIAGNOSIS — M6281 Muscle weakness (generalized): Secondary | ICD-10-CM

## 2016-04-12 DIAGNOSIS — N179 Acute kidney failure, unspecified: Secondary | ICD-10-CM

## 2016-04-12 DIAGNOSIS — D631 Anemia in chronic kidney disease: Secondary | ICD-10-CM | POA: Diagnosis present

## 2016-04-12 DIAGNOSIS — N201 Calculus of ureter: Secondary | ICD-10-CM | POA: Diagnosis not present

## 2016-04-12 DIAGNOSIS — Z6828 Body mass index (BMI) 28.0-28.9, adult: Secondary | ICD-10-CM

## 2016-04-12 DIAGNOSIS — I130 Hypertensive heart and chronic kidney disease with heart failure and stage 1 through stage 4 chronic kidney disease, or unspecified chronic kidney disease: Secondary | ICD-10-CM | POA: Diagnosis present

## 2016-04-12 DIAGNOSIS — G4733 Obstructive sleep apnea (adult) (pediatric): Secondary | ICD-10-CM | POA: Diagnosis present

## 2016-04-12 DIAGNOSIS — I429 Cardiomyopathy, unspecified: Secondary | ICD-10-CM | POA: Diagnosis not present

## 2016-04-12 DIAGNOSIS — R4182 Altered mental status, unspecified: Secondary | ICD-10-CM | POA: Diagnosis present

## 2016-04-12 HISTORY — PX: IR GENERIC HISTORICAL: IMG1180011

## 2016-04-12 LAB — COMPREHENSIVE METABOLIC PANEL
ALBUMIN: 3.3 g/dL — AB (ref 3.5–5.0)
ALK PHOS: 74 U/L (ref 38–126)
ALT: 12 U/L — AB (ref 17–63)
ANION GAP: 14 (ref 5–15)
AST: 26 U/L (ref 15–41)
BUN: 42 mg/dL — ABNORMAL HIGH (ref 6–20)
CHLORIDE: 99 mmol/L — AB (ref 101–111)
CO2: 18 mmol/L — AB (ref 22–32)
CREATININE: 3.01 mg/dL — AB (ref 0.61–1.24)
Calcium: 8.6 mg/dL — ABNORMAL LOW (ref 8.9–10.3)
GFR calc non Af Amer: 17 mL/min — ABNORMAL LOW (ref >60)
GFR, EST AFRICAN AMERICAN: 20 mL/min — AB (ref >60)
GLUCOSE: 209 mg/dL — AB (ref 65–99)
Potassium: 4.1 mmol/L (ref 3.5–5.1)
SODIUM: 131 mmol/L — AB (ref 135–145)
Total Bilirubin: 1.4 mg/dL — ABNORMAL HIGH (ref 0.3–1.2)
Total Protein: 7.9 g/dL (ref 6.5–8.1)

## 2016-04-12 LAB — APTT: aPTT: 34 seconds (ref 24–36)

## 2016-04-12 LAB — CBC WITH DIFFERENTIAL/PLATELET
BASOS ABS: 0 10*3/uL (ref 0–0.1)
Basophils Relative: 0 %
Eosinophils Absolute: 0 10*3/uL (ref 0–0.7)
Eosinophils Relative: 0 %
HEMATOCRIT: 31.6 % — AB (ref 40.0–52.0)
Hemoglobin: 11 g/dL — ABNORMAL LOW (ref 13.0–18.0)
LYMPHS ABS: 0.4 10*3/uL — AB (ref 1.0–3.6)
LYMPHS PCT: 3 %
MCH: 29.7 pg (ref 26.0–34.0)
MCHC: 34.9 g/dL (ref 32.0–36.0)
MCV: 85.1 fL (ref 80.0–100.0)
Monocytes Absolute: 1.6 10*3/uL — ABNORMAL HIGH (ref 0.2–1.0)
Monocytes Relative: 11 %
NEUTROS ABS: 12.9 10*3/uL — AB (ref 1.4–6.5)
Neutrophils Relative %: 86 %
Platelets: 175 10*3/uL (ref 150–440)
RBC: 3.71 MIL/uL — AB (ref 4.40–5.90)
RDW: 17 % — ABNORMAL HIGH (ref 11.5–14.5)
WBC: 14.9 10*3/uL — AB (ref 3.8–10.6)

## 2016-04-12 LAB — URINALYSIS COMPLETE WITH MICROSCOPIC (ARMC ONLY)
BILIRUBIN URINE: NEGATIVE
Glucose, UA: NEGATIVE mg/dL
KETONES UR: NEGATIVE mg/dL
NITRITE: NEGATIVE
PH: 5 (ref 5.0–8.0)
Protein, ur: 100 mg/dL — AB
SPECIFIC GRAVITY, URINE: 1.014 (ref 1.005–1.030)
Squamous Epithelial / LPF: NONE SEEN

## 2016-04-12 LAB — CBC
HEMATOCRIT: 30.6 % — AB (ref 40.0–52.0)
HEMOGLOBIN: 10.2 g/dL — AB (ref 13.0–18.0)
MCH: 27 pg (ref 26.0–34.0)
MCHC: 33.4 g/dL (ref 32.0–36.0)
MCV: 80.8 fL (ref 80.0–100.0)
Platelets: 150 10*3/uL (ref 150–440)
RBC: 3.78 MIL/uL — ABNORMAL LOW (ref 4.40–5.90)
RDW: 16.7 % — AB (ref 11.5–14.5)
WBC: 14.5 10*3/uL — ABNORMAL HIGH (ref 3.8–10.6)

## 2016-04-12 LAB — MRSA PCR SCREENING: MRSA BY PCR: NEGATIVE

## 2016-04-12 LAB — GLUCOSE, CAPILLARY
GLUCOSE-CAPILLARY: 152 mg/dL — AB (ref 65–99)
Glucose-Capillary: 159 mg/dL — ABNORMAL HIGH (ref 65–99)

## 2016-04-12 LAB — TROPONIN I
TROPONIN I: 0.07 ng/mL — AB (ref <0.03)
TROPONIN I: 0.4 ng/mL — AB (ref <0.03)
Troponin I: 0.1 ng/mL (ref <0.03)

## 2016-04-12 LAB — CREATININE, SERUM
CREATININE: 2.94 mg/dL — AB (ref 0.61–1.24)
GFR calc Af Amer: 21 mL/min — ABNORMAL LOW (ref >60)
GFR, EST NON AFRICAN AMERICAN: 18 mL/min — AB (ref >60)

## 2016-04-12 LAB — PROTIME-INR
INR: 1.13
Prothrombin Time: 14.6 seconds (ref 11.4–15.2)

## 2016-04-12 LAB — LACTIC ACID, PLASMA
LACTIC ACID, VENOUS: 1.2 mmol/L (ref 0.5–1.9)
Lactic Acid, Venous: 4 mmol/L (ref 0.5–1.9)

## 2016-04-12 LAB — LIPASE, BLOOD: Lipase: 19 U/L (ref 11–51)

## 2016-04-12 MED ORDER — IOPAMIDOL (ISOVUE-300) INJECTION 61%
30.0000 mL | Freq: Once | INTRAVENOUS | Status: AC | PRN
  Administered 2016-04-12: 30 mL via ORAL

## 2016-04-12 MED ORDER — INSULIN GLARGINE 100 UNIT/ML ~~LOC~~ SOLN
12.0000 [IU] | Freq: Every day | SUBCUTANEOUS | Status: DC
  Administered 2016-04-13 – 2016-04-16 (×3): 12 [IU] via SUBCUTANEOUS
  Filled 2016-04-12 (×4): qty 0.12

## 2016-04-12 MED ORDER — IOPAMIDOL (ISOVUE-300) INJECTION 61%
30.0000 mL | Freq: Once | INTRAVENOUS | Status: AC | PRN
  Administered 2016-04-12: 15 mL via INTRAVENOUS

## 2016-04-12 MED ORDER — DOCUSATE SODIUM 100 MG PO CAPS
100.0000 mg | ORAL_CAPSULE | Freq: Two times a day (BID) | ORAL | Status: DC
  Administered 2016-04-12 – 2016-04-15 (×7): 100 mg via ORAL
  Filled 2016-04-12 (×7): qty 1

## 2016-04-12 MED ORDER — ACETAMINOPHEN 650 MG RE SUPP: 650.0000 mg | Freq: Four times a day (QID) | RECTAL | Status: DC | PRN

## 2016-04-12 MED ORDER — ENOXAPARIN SODIUM 40 MG/0.4ML ~~LOC~~ SOLN
30.0000 mg | SUBCUTANEOUS | Status: DC
  Administered 2016-04-12: 30 mg via SUBCUTANEOUS
  Filled 2016-04-12 (×3): qty 0.4

## 2016-04-12 MED ORDER — SODIUM CHLORIDE 0.9 % IV SOLN
INTRAVENOUS | Status: DC
  Administered 2016-04-13 – 2016-04-16 (×7): via INTRAVENOUS

## 2016-04-12 MED ORDER — DEXTROSE 5 % IV SOLN
1.0000 g | INTRAVENOUS | Status: DC
  Administered 2016-04-12: 1 g via INTRAVENOUS
  Filled 2016-04-12: qty 10

## 2016-04-12 MED ORDER — ONDANSETRON HCL 4 MG/2ML IJ SOLN: 4.0000 mg | Freq: Four times a day (QID) | INTRAMUSCULAR | Status: DC | PRN

## 2016-04-12 MED ORDER — FENTANYL CITRATE (PF) 100 MCG/2ML IJ SOLN
INTRAMUSCULAR | Status: AC | PRN
  Administered 2016-04-12 (×3): 25 ug via INTRAVENOUS

## 2016-04-12 MED ORDER — PIPERACILLIN-TAZOBACTAM 3.375 G IVPB
3.3750 g | Freq: Two times a day (BID) | INTRAVENOUS | Status: DC
  Administered 2016-04-12 – 2016-04-14 (×5): 3.375 g via INTRAVENOUS
  Filled 2016-04-12 (×5): qty 50

## 2016-04-12 MED ORDER — SODIUM CHLORIDE 0.9% FLUSH
3.0000 mL | Freq: Two times a day (BID) | INTRAVENOUS | Status: DC
  Administered 2016-04-12 – 2016-04-15 (×6): 3 mL via INTRAVENOUS

## 2016-04-12 MED ORDER — PIPERACILLIN-TAZOBACTAM 3.375 G IVPB 30 MIN
3.3750 g | Freq: Once | INTRAVENOUS | Status: AC
  Administered 2016-04-12: 3.375 g via INTRAVENOUS
  Filled 2016-04-12: qty 50

## 2016-04-12 MED ORDER — ACETAMINOPHEN 325 MG PO TABS
650.0000 mg | ORAL_TABLET | Freq: Four times a day (QID) | ORAL | Status: DC | PRN
  Administered 2016-04-12 – 2016-04-13 (×2): 650 mg via ORAL
  Filled 2016-04-12: qty 2

## 2016-04-12 MED ORDER — SODIUM CHLORIDE 0.9 % IV BOLUS (SEPSIS)
250.0000 mL | Freq: Once | INTRAVENOUS | Status: AC
  Administered 2016-04-12: 250 mL via INTRAVENOUS

## 2016-04-12 MED ORDER — SODIUM CHLORIDE 0.9 % IV BOLUS (SEPSIS)
1000.0000 mL | Freq: Once | INTRAVENOUS | Status: AC
  Administered 2016-04-12: 1000 mL via INTRAVENOUS

## 2016-04-12 MED ORDER — INSULIN ASPART 100 UNIT/ML ~~LOC~~ SOLN
0.0000 [IU] | Freq: Every day | SUBCUTANEOUS | Status: DC
  Administered 2016-04-13: 3 [IU] via SUBCUTANEOUS
  Filled 2016-04-12: qty 3
  Filled 2016-04-12: qty 1

## 2016-04-12 MED ORDER — FINASTERIDE 5 MG PO TABS
5.0000 mg | ORAL_TABLET | Freq: Every day | ORAL | Status: DC
  Administered 2016-04-12 – 2016-04-16 (×5): 5 mg via ORAL
  Filled 2016-04-12 (×5): qty 1

## 2016-04-12 MED ORDER — FENTANYL CITRATE (PF) 100 MCG/2ML IJ SOLN
INTRAMUSCULAR | Status: AC
  Filled 2016-04-12: qty 4

## 2016-04-12 MED ORDER — TAMSULOSIN HCL 0.4 MG PO CAPS
0.4000 mg | ORAL_CAPSULE | Freq: Every day | ORAL | Status: DC
  Administered 2016-04-12 – 2016-04-16 (×5): 0.4 mg via ORAL
  Filled 2016-04-12 (×6): qty 1

## 2016-04-12 MED ORDER — VANCOMYCIN HCL IN DEXTROSE 1-5 GM/200ML-% IV SOLN
1000.0000 mg | Freq: Once | INTRAVENOUS | Status: AC
  Administered 2016-04-12: 1000 mg via INTRAVENOUS
  Filled 2016-04-12: qty 200

## 2016-04-12 MED ORDER — ONDANSETRON HCL 4 MG PO TABS: 4.0000 mg | ORAL_TABLET | Freq: Four times a day (QID) | ORAL | Status: DC | PRN

## 2016-04-12 MED ORDER — ACETAMINOPHEN 325 MG PO TABS
ORAL_TABLET | ORAL | Status: AC
  Administered 2016-04-13: 650 mg via ORAL
  Filled 2016-04-12: qty 2

## 2016-04-12 MED ORDER — INSULIN ASPART 100 UNIT/ML ~~LOC~~ SOLN
3.0000 [IU] | Freq: Three times a day (TID) | SUBCUTANEOUS | Status: DC
  Administered 2016-04-13 – 2016-04-16 (×8): 3 [IU] via SUBCUTANEOUS
  Filled 2016-04-12 (×8): qty 3

## 2016-04-12 MED ORDER — INSULIN ASPART 100 UNIT/ML ~~LOC~~ SOLN
0.0000 [IU] | Freq: Three times a day (TID) | SUBCUTANEOUS | Status: DC
  Administered 2016-04-13: 2 [IU] via SUBCUTANEOUS
  Administered 2016-04-13: 1 [IU] via SUBCUTANEOUS
  Administered 2016-04-13: 3 [IU] via SUBCUTANEOUS
  Administered 2016-04-14 – 2016-04-16 (×3): 2 [IU] via SUBCUTANEOUS
  Filled 2016-04-12 (×4): qty 2
  Filled 2016-04-12: qty 1

## 2016-04-12 MED ORDER — OXYCODONE-ACETAMINOPHEN 5-325 MG PO TABS
1.0000 | ORAL_TABLET | ORAL | Status: DC | PRN
  Administered 2016-04-13: 1 via ORAL
  Filled 2016-04-12: qty 1

## 2016-04-12 MED ORDER — DEXTROSE 5 % IV SOLN: 1.0000 g | INTRAVENOUS | Status: DC

## 2016-04-12 NOTE — Progress Notes (Signed)
Called floor asking for weight to be entered to be able to assess renal function and dose medications/complete consults.   [name] 04/12/16 7:05 PM

## 2016-04-12 NOTE — ED Notes (Signed)
BC x2 sent to lab

## 2016-04-12 NOTE — H&P (Signed)
[facility] - [place] at [facility]   PATIENT NAME: [name]    MR#:  [id number]  DATE OF BIRTH:  12-15-1927  DATE OF ADMISSION:  04/12/2016  PRIMARY CARE PHYSICIAN: Lauro Regulus., MD   REQUESTING/REFERRING PHYSICIAN:   CHIEF COMPLAINT:   Chief Complaint  Patient presents with  . Abdominal Pain  . Extremity Weakness  . Altered Mental Status    HISTORY OF PRESENT ILLNESS: [name]  is a 82 y.o. male with a known history of BPH, coronary artery disease, essential hypertension, gastrointestinal bleed, obstructive sleep apnea, hyperlipidemia, diabetes mellitus, who presents to the hospital with complaints of left flank pain for the past 1-2 days, confusion, worsening somnolence, poor appetite, poor responsiveness. According to patient's wife, as patient is not able to provide history due to significant somnolence, she noted him to perform yesterday, he was also very weak, not able to respond to requests, he was complaining of left flank pain yesterday, which prompted him to take at least 3 tablets of oxycodone, left after a gout exacerbation. He was loopy, somnolent, not responding well and she brought him to emergency room for further evaluation and treatment. He was febrile to above 100. In emergency room, tachypneic, his labs revealed acute renal failure, CT scan of abdomen showed large 1.5 cm stone in left ureter with resultant moderate left hydronephrosis. Hospitalist services were contacted for admission. Lactic acid level is elevated at 4.0.  PAST MEDICAL HISTORY:   Past Medical History:  Diagnosis Date  . BPH (benign prostatic hypertrophy)   . Coronary artery disease   . Diverticulosis   . Essential hypertension, benign   . GI bleed 2015  . Hypersomnia with sleep apnea, unspecified   . MI (myocardial infarction)   . Other and unspecified hyperlipidemia   . Pure hypercholesterolemia   . Type II or unspecified type diabetes mellitus  without mention of complication, uncontrolled     PAST SURGICAL HISTORY: Past Surgical History:  Procedure Laterality Date  . CARDIAC CATHETERIZATION    . CHOLECYSTECTOMY  1994  . CORONARY ANGIOPLASTY  1996   [place]  . ESOPHAGOGASTRODUODENOSCOPY (EGD) WITH PROPOFOL Left 11/23/2015   Procedure: ESOPHAGOGASTRODUODENOSCOPY (EGD) WITH PROPOFOL;  Surgeon: [name], MD;  Location: [facility] ENDOSCOPY;  Service: Endoscopy;  Laterality: Left;  . FLEXIBLE SIGMOIDOSCOPY    . inguinal hernia repair     left inguinal   . TONSILLECTOMY      SOCIAL HISTORY:  Social History  Substance Use Topics  . Smoking status: Former Smoker    Packs/day: 1.00    Years: 50.00    Types: Cigarettes  . Smokeless tobacco: Never Used  . Alcohol use No    FAMILY HISTORY:  Family History  Problem Relation Age of Onset  . Rheum arthritis Mother   . Alzheimer's disease Father     DRUG ALLERGIES:  Allergies  Allergen Reactions  . Exforge [Amlodipine Besylate-Valsartan] Swelling and Other (See Comments)    Pt states that he is unable to swallow.    . Lisinopril Swelling and Other (See Comments)    Pt states that he is unable to swallow.    . Niacin And Related Swelling and Other (See Comments)    Pt states that he is unable to swallow.      Review of Systems  Unable to perform ROS: Mental acuity    MEDICATIONS AT HOME:  Prior to Admission medications   Medication Sig Start Date End Date Taking? Authorizing Provider  Coenzyme Q10 (CO Q 10) 10 MG CAPS Take by mouth.   Yes Historical Provider, MD  colchicine-probenecid 0.5-500 MG tablet Take 1 tablet by mouth daily. 03/31/16  Yes Historical Provider, MD  finasteride (PROSCAR) 5 MG tablet Take 1 tablet (5 mg total) by mouth daily. 12/02/15  Yes [name], MD  furosemide (LASIX) 40 MG tablet Take 2 tablets every am & 1 tablet every pm.   Yes Historical Provider, MD  glimepiride (AMARYL) 1 MG tablet Take 1 mg by mouth daily with breakfast.  03/02/16  03/02/17 Yes Historical Provider, MD  Insulin Glargine (LANTUS SOLOSTAR) 100 UNIT/ML Solostar Pen Inject 12 Units into the skin daily.  09/11/14  Yes Historical Provider, MD  losartan (COZAAR) 100 MG tablet Take 100 mg by mouth daily. 03/31/16  Yes Historical Provider, MD  MAGNESIUM PO Take by mouth.   Yes Historical Provider, MD  metolazone (ZAROXOLYN) 5 MG tablet Take 5 mg by mouth as needed. Reported on 12/15/2015   Yes Historical Provider, MD  oxyCODONE-acetaminophen (PERCOCET/ROXICET) 5-325 MG tablet Take by mouth every 4 (four) hours as needed for severe pain. Reported on 01/09/2016   Yes Historical Provider, MD  potassium chloride (KLOR-CON) 20 MEQ packet Take 20 mEq by mouth daily.   Yes Historical Provider, MD  sitaGLIPtin-metformin (JANUMET) 50-1000 MG tablet Take 1 tablet by mouth 2 (two) times daily with a meal.   Yes Historical Provider, MD  tamsulosin (FLOMAX) 0.4 MG CAPS capsule Take 1 capsule (0.4 mg total) by mouth daily. 01/09/16  Yes [name], MD      PHYSICAL EXAMINATION:   VITAL SIGNS: Blood pressure 131/79, pulse 78, temperature 99.1 F (37.3 C), temperature source Oral, resp. rate 16, SpO2 94 %.  GENERAL:  82 y.o.-year-old patient lying in the bed in mild to moderate distress due to somnolence, weakness. The patient is sleepy, however, able to open his eyes, shakes, or nods his head, nonverbal.  EYES: Pupils equal, round, reactive to light and accommodation. No scleral icterus. Extraocular muscles intact.  HEENT: Head atraumatic, normocephalic. Oropharynx and nasopharynx clear.  NECK:  Supple, no jugular venous distention. No thyroid enlargement, no tenderness.  LUNGS: Some diminished breath sounds bilaterally at bases, no wheezing, few scattered basilar rales,rhonchi , but no crepitations noted. No use of accessory muscles of respiration.  CARDIOVASCULAR: S1, S2 normal. No murmurs, rubs, or gallops.  ABDOMEN: Soft, nontender, nondistended. Bowel sounds present. No  organomegaly or mass. No significant CVA tenderness on percussion bilaterally EXTREMITIES: 1+ lower extremity and pedal edema bilaterally, no cyanosis, or clubbing.  NEUROLOGIC: Cranial nerves II through XII are grossly intact. Muscle strength is  difficult to examine in all extremities. Due to significant somnolence, but able to move all extremities. Sensation not able to examine. Gait not checked.  PSYCHIATRIC: The patient is somnolent, only able to open eyes, nods or shakes his head to questions, nonverbal  SKIN: No obvious rash, lesion, or ulcer.   LABORATORY PANEL:   CBC  Recent Labs Lab 04/12/16 1123  WBC 14.9*  HGB 11.0*  HCT 31.6*  PLT 175  MCV 85.1  MCH 29.7  MCHC 34.9  RDW 17.0*  LYMPHSABS 0.4*  MONOABS 1.6*  EOSABS 0.0  BASOSABS 0.0   ------------------------------------------------------------------------------------------------------------------  Chemistries   Recent Labs Lab 04/12/16 1123  NA 131*  K 4.1  CL 99*  CO2 18*  GLUCOSE 209*  BUN 42*  CREATININE 3.01*  CALCIUM 8.6*  AST 26  ALT 12*  ALKPHOS 74  BILITOT 1.4*   ------------------------------------------------------------------------------------------------------------------  Cardiac Enzymes  Recent Labs Lab 04/12/16 1123  TROPONINI 0.07*   ------------------------------------------------------------------------------------------------------------------  RADIOLOGY: Ct Abdomen Pelvis Wo Contrast  Result Date: 04/12/2016 CLINICAL DATA:  confusion and weakness since 6am. Family reports patient complaining of abdominal pain yesterday and taking pain medication and becoming sleepier than usual. Patient feels hot to the touch. Patient is extremely weak and confused. This RN had difficulty getting patient from the vehicle. Hx of cholecystectomy and inguinal hernia repair EXAM: CT ABDOMEN AND PELVIS WITHOUT CONTRAST TECHNIQUE: Multidetector CT imaging of the abdomen and pelvis was performed  following the standard protocol without IV contrast. COMPARISON:  04/30/2009 FINDINGS: Lower chest: Patchy subsegmental atelectasis or scarring posteriorly in the visualized lung bases left greater than right. Coronary calcifications. Four-chamber cardiac enlargement. Blood pool less dense than the interventricular septum suggesting anemia. Hepatobiliary: No focal liver abnormality is seen. Status post cholecystectomy. No biliary dilatation. Pancreas: Mild diffuse atrophy without mass or ductal dilatation. Spleen: Normal in size without focal abnormality. Adrenals/Urinary Tract: Stable 15 mm left adrenal nodule. Moderate left hydronephrosis and marked ureterectasis down to the level of a 15 mm mid ureteral calculus. Foley catheter decompresses the urinary bladder. 8 mm calculus in the lumen of the urinary bladder. Right kidney and adrenal gland unremarkable. Stomach/Bowel: Stomach, small bowel, and colon are nondilated. Scattered descending and sigmoid diverticula. Vascular/Lymphatic: Moderate aortoiliac arterial calcifications. Right common iliac artery dilated 2.2 cm diameter, left also 2.2 cm. Reproductive: Prostatic enlargement.  Foley catheter in place. Other: No ascites.  No free air. Musculoskeletal: Degenerative disc disease L4-5 and L5-S1. Negative for fracture or dislocation. IMPRESSION: 1. Obstructing 15 mm left ureteral calculus. Moderate left hydronephrosis and proximal ureterectasis. 2. 8 mm urinary bladder calculus. 3. Descending and sigmoid diverticulosis. 4. Cardiomegaly with coronary calcifications. 5. 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 Electronically Signed   By: Corlis Leak M.D.   On: 04/12/2016 13:37   Dg Chest Port 1 View  Result Date: 04/12/2016 CLINICAL DATA:  Confusion and weakness since 6 a.m. EXAM: PORTABLE CHEST 1 VIEW COMPARISON:  11/22/2015 FINDINGS: Subtle patchy densities in the left lower lobe appear to be new. Remainder of  the lungs are clear. Stable enlargement of the cardiac silhouette. Patient is rotated towards the right which is similar to the prior examination. No acute bone abnormality. IMPRESSION: Subtle densities at the left lung base. Although these findings could be related atelectasis, subtle infection cannot be excluded in this area. Stable cardiomegaly. Electronically Signed   By: Richarda Overlie M.D.   On: 04/12/2016 11:45    EKG: Orders placed or performed during the hospital encounter of 04/12/16  . EKG 12-Lead  . EKG 12-Lead  . EKG 12-Lead  . EKG 12-Lead   EKG reveals sinus or ectopic atrial rhythm at 95 bpm, normal axis, borderline left axis deviation, abnormal R-wave progression in V3, V4, nonspecific repolarization abnormalities in inferior leads, thinking IMPRESSION AND PLAN:  Active Problems:   Sepsis (HCC)   Lactic acidosis   Acute renal failure (ARF) (HCC)   Ureteral stone with hydronephrosis   Leukocytosis   Acute pyelonephritis   Atelectasis of left lung  #1. Sepsis due to acute pyelonephritis. Admit patient to medical floor, continue IV fluids, antibiotic therapy, blood cultures, urine cultures and sputum cultures were ordered, pending, just antibodies. Depending on culture results #2. Lactic acidosis, follow lactic acid level with hydration, antibiotic therapy #3. Acute renal failure due to infection and obstructive stone, continue IV fluids, follow creatinine in  the morning, interventional radiologist is planning to compress left kidney with nephro stoma placement #4 ureteral stone with hydronephrosis, neurologist involved recommendations. Continue broad-spectrum antibiotic therapy #5. Leukocytosis, follow with antibiotic therapy #6. Acute pyelonephritis, continue antibiotic therapy, cultures are pending #7. Left lung atelectasis, questionable pneumonia, brought to spectrum antibiotics, get sputum cultures #8. Metabolic encephalopathy, follow clinically. Neuro checks every 8  hours  All the records are reviewed and case discussed with ED provider. Management plans discussed with the patient, family and they are in agreement.  CODE STATUS: Code Status History    Date Active Date Inactive Code Status Order ID Comments User Context   11/22/2015  6:10 PM 11/25/2015  2:33 PM Full Code [id number]  Enedina FinnerSona Patel, MD Inpatient   02/21/2015  8:30 PM 02/23/2015  9:59 PM Full Code [id number]  Houston SirenVivek J Sainani, MD Inpatient       TOTAL Critical care TIME TAKING CARE OF THIS PATIENT: 60 minutes.    Katharina CaperVAICKUTE,Shequita Peplinski M.D on 04/12/2016 at 3:31 PM  Between 7am to 6pm - Pager - [phone] After 6pm go to [email or web address] - password [email or web address] [facility]  [facility]  Office  [phone]  CC: Primary care physician; Lauro RegulusANDERSON,MARSHALL W., MD

## 2016-04-12 NOTE — ED Notes (Signed)
Pt arrived with foley and legbag.  Cloudy urine noted.  Discontinued. Catheter intact

## 2016-04-12 NOTE — Consult Note (Signed)
I have been asked to see the patient by Dr. Sharman Cheek, for evaluation and management of left ureteral stone and infection.  History of present illness: 82 year old male who presented to the emergency department today with weakness, somnolence, and left-sided abdominal pain. In the ER, the patient was noted to be somnolent and a urine evaluation demonstrated significant white blood cells. He was noted to be in acute renal failure and have an elevated white blood cell count. A lactate was 4.0.   A CT scan was obtained which showed 16 mm stone in the mid left ureter with proximal hydronephrosis and stranding.  The patient has a history of urinary retention and has a chronic indwelling Foley catheter. He is scheduled for follow-up in urology in 2 weeks for further evaluation and management of his catheter.  Given the acuity of the situation, I consult interventional radiology to help with placement of nephrostomy tube. The nephrostomy tube had been placed prior to my arrival. There was significant amount of purulence and the nephrostomy tube. The patient's temperature was 102F. He was otherwise stable with no hemodynamic instability.  Review of systems: A 12 point comprehensive review of systems was obtained and is negative unless otherwise stated in the history of present illness.  Patient Active Problem List   Diagnosis Date Noted  . Sepsis (HCC) 04/12/2016  . Lactic acidosis 04/12/2016  . Acute renal failure (ARF) (HCC) 04/12/2016  . Ureteral stone with hydronephrosis 04/12/2016  . Leukocytosis 04/12/2016  . Acute pyelonephritis 04/12/2016  . Atelectasis of left lung 04/12/2016  . Nephrolithiasis 12/02/2015  . Chronic progressive renal failure, stage 3 (moderate) 12/02/2015  . Urinary retention 12/02/2015  . Chronic gouty arthritis 09/23/2015  . Morbid obesity (HCC) 04/15/2015  . GI bleed 02/21/2015  . Peripheral vascular disease (HCC) 10/24/2014  . Frank hematuria 09/30/2014  .  Bradycardia 06/17/2014  . Paroxysmal atrial fibrillation (HCC) 06/07/2014  . Arteriosclerosis of coronary artery 01/26/2014  . Diabetes mellitus (HCC) 01/26/2014  . Essential hypertension 12/19/2013  . Cardiomyopathy (HCC) 11/21/2013  . Hypercholesterolemia 11/21/2013  . Obstructive apnea 11/21/2013  . Coronary artery disease 07/27/2013  . Status post coronary angioplasty 07/27/2013  . Diabetes mellitus type 2 with complications (HCC) 07/27/2013  . Atrial fibrillation (HCC) 07/27/2013  . Obesity 07/27/2013  . Hyperlipidemia 07/27/2013  . Chronic diastolic CHF (congestive heart failure) (HCC) 07/27/2013  . Lower GI bleed 07/27/2013  . Elevated prostate specific antigen (PSA) 08/07/2012  . Benign prostatic hyperplasia with urinary obstruction 08/07/2012  . ED (erectile dysfunction) of organic origin 08/07/2012    No current facility-administered medications on file prior to encounter.    Current Outpatient Prescriptions on File Prior to Encounter  Medication Sig Dispense Refill  . Coenzyme Q10 (CO Q 10) 10 MG CAPS Take by mouth.    . finasteride (PROSCAR) 5 MG tablet Take 1 tablet (5 mg total) by mouth daily. 90 tablet 3  . furosemide (LASIX) 40 MG tablet Take 2 tablets every am & 1 tablet every pm.    . glimepiride (AMARYL) 1 MG tablet Take 1 mg by mouth daily with breakfast.     . Insulin Glargine (LANTUS SOLOSTAR) 100 UNIT/ML Solostar Pen Inject 12 Units into the skin daily.     Marland Kitchen MAGNESIUM PO Take by mouth.    . metolazone (ZAROXOLYN) 5 MG tablet Take 5 mg by mouth as needed. Reported on 12/15/2015    . oxyCODONE-acetaminophen (PERCOCET/ROXICET) 5-325 MG tablet Take by mouth every 4 (four) hours as needed  for severe pain. Reported on 01/09/2016    . potassium chloride (KLOR-CON) 20 MEQ packet Take 20 mEq by mouth daily.    . sitaGLIPtin-metformin (JANUMET) 50-1000 MG tablet Take 1 tablet by mouth 2 (two) times daily with a meal.    . tamsulosin (FLOMAX) 0.4 MG CAPS capsule Take 1  capsule (0.4 mg total) by mouth daily. 90 capsule 3    Past Medical History:  Diagnosis Date  . BPH (benign prostatic hypertrophy)   . Coronary artery disease   . Diverticulosis   . Essential hypertension, benign   . GI bleed 2015  . Hypersomnia with sleep apnea, unspecified   . MI (myocardial infarction)   . Other and unspecified hyperlipidemia   . Pure hypercholesterolemia   . Type II or unspecified type diabetes mellitus without mention of complication, uncontrolled     Past Surgical History:  Procedure Laterality Date  . CARDIAC CATHETERIZATION    . CHOLECYSTECTOMY  1994  . CORONARY ANGIOPLASTY  1996   [place]  . ESOPHAGOGASTRODUODENOSCOPY (EGD) WITH PROPOFOL Left 11/23/2015   Procedure: ESOPHAGOGASTRODUODENOSCOPY (EGD) WITH PROPOFOL;  Surgeon: [name], MD;  Location: [facility] ENDOSCOPY;  Service: Endoscopy;  Laterality: Left;  . FLEXIBLE SIGMOIDOSCOPY    . inguinal hernia repair     left inguinal   . TONSILLECTOMY      Social History  Substance Use Topics  . Smoking status: Former Smoker    Packs/day: 1.00    Years: 50.00    Types: Cigarettes  . Smokeless tobacco: Never Used  . Alcohol use No    Family History  Problem Relation Age of Onset  . Rheum arthritis Mother   . Alzheimer's disease Father     PE: Vitals:   04/12/16 1641 04/12/16 1715 04/12/16 1730 04/12/16 1737  BP: (!) 193/83  129/68   Pulse: 92     Resp: (!) 25     Temp:  (!) 102 F (38.9 C)  99.7 F (37.6 C)  TempSrc:      SpO2: 97%      Patient appears to be in no acute distress - somnolent but arousable patient is oriented x3 Atraumatic normocephalic head No cervical or supraclavicular lymphadenopathy appreciated No increased work of breathing, no audible wheezes/rhonchi Regular sinus rhythm/rate Abdomen is soft, nontender, significant tenderness in his left CVA. A nephrostomy tube was placed and is draining purulent urine. Lower extremities are symmetric without appreciable  edema Grossly neurologically intact No identifiable skin lesions   Recent Labs  04/12/16 1123  WBC 14.9*  HGB 11.0*  HCT 31.6*    Recent Labs  04/12/16 1123  NA 131*  K 4.1  CL 99*  CO2 18*  GLUCOSE 209*  BUN 42*  CREATININE 3.01*  CALCIUM 8.6*    Recent Labs  04/12/16 1123  INR 1.13   No results for input(s): LABURIN in the last 72 hours. Results for orders placed or performed during the hospital encounter of 01/11/16  Urine culture     Status: Abnormal   Collection Time: 01/11/16  7:00 AM  Result Value Ref Range Status   Specimen Description URINE, CATHETERIZED  Final   Special Requests Normal  Final   Culture (A)  Final    >=100,000 COLONIES/mL STAPHYLOCOCCUS SPECIES (COAGULASE NEGATIVE)   Report Status 01/13/2016 FINAL  Final   Organism ID, Bacteria STAPHYLOCOCCUS SPECIES (COAGULASE NEGATIVE) (A)  Final      Susceptibility   Staphylococcus species (coagulase negative) - MIC*    CIPROFLOXACIN >=  8 RESISTANT Resistant     GENTAMICIN <=0.5 SENSITIVE Sensitive     NITROFURANTOIN <=16 SENSITIVE Sensitive     OXACILLIN Value in next row Resistant      >=4 RESISTANTWARNING: For oxacillin-resistant S.aureus and coagulase-negative staphylococci (MRS), other beta-lactam agents, ie, penicillins, beta-lactam/beta-lactamase inhibitor combinations, cephems (with the exception of the cephalosporins with anti-MRSA activity), and carbapenems, may appear active in vitro, but are not effective clinically.  --CLSI, Vol.32 No.3, January 2012, pg 70.    TETRACYCLINE Value in next row Sensitive      >=4 RESISTANTWARNING: For oxacillin-resistant S.aureus and coagulase-negative staphylococci (MRS), other beta-lactam agents, ie, penicillins, beta-lactam/beta-lactamase inhibitor combinations, cephems (with the exception of the cephalosporins with anti-MRSA activity), and carbapenems, may appear active in vitro, but are not effective clinically.  --CLSI, Vol.32 No.3, January 2012, pg 70.     VANCOMYCIN Value in next row Sensitive      >=4 RESISTANTWARNING: For oxacillin-resistant S.aureus and coagulase-negative staphylococci (MRS), other beta-lactam agents, ie, penicillins, beta-lactam/beta-lactamase inhibitor combinations, cephems (with the exception of the cephalosporins with anti-MRSA activity), and carbapenems, may appear active in vitro, but are not effective clinically.  --CLSI, Vol.32 No.3, January 2012, pg 70.    TRIMETH/SULFA Value in next row Resistant      >=4 RESISTANTWARNING: For oxacillin-resistant S.aureus and coagulase-negative staphylococci (MRS), other beta-lactam agents, ie, penicillins, beta-lactam/beta-lactamase inhibitor combinations, cephems (with the exception of the cephalosporins with anti-MRSA activity), and carbapenems, may appear active in vitro, but are not effective clinically.  --CLSI, Vol.32 No.3, January 2012, pg 70.    CLINDAMYCIN Value in next row Resistant      >=4 RESISTANTWARNING: For oxacillin-resistant S.aureus and coagulase-negative staphylococci (MRS), other beta-lactam agents, ie, penicillins, beta-lactam/beta-lactamase inhibitor combinations, cephems (with the exception of the cephalosporins with anti-MRSA activity), and carbapenems, may appear active in vitro, but are not effective clinically.  --CLSI, Vol.32 No.3, January 2012, pg 70.    RIFAMPIN Value in next row Sensitive      >=4 RESISTANTWARNING: For oxacillin-resistant S.aureus and coagulase-negative staphylococci (MRS), other beta-lactam agents, ie, penicillins, beta-lactam/beta-lactamase inhibitor combinations, cephems (with the exception of the cephalosporins with anti-MRSA activity), and carbapenems, may appear active in vitro, but are not effective clinically.  --CLSI, Vol.32 No.3, January 2012, pg 70.    * >=100,000 COLONIES/mL STAPHYLOCOCCUS SPECIES (COAGULASE NEGATIVE)    Imaging: I have independently reviewed the patient's CT scan which demonstrates a 16 mm stone in the mid/distal  left ureter with proximal hydronephrosis.  Imp: The patient had an infected left mid ureteral stone with associated obstruction. The obstruction is now been relieved by interventional radiology. He has significant purulence that is draining. He is febrile but hemodynamically stable.  Recommendations: Our plan is to continue with broad-spectrum antibiotics until the patient's urine cultures return, at which point the abx should be narrowed to oral antibiotic and he should be kept on this for at least 2 weeks. He already has follow-up scheduled with Dr. Apolinar JunesBrandon in 2 weeks to discuss his urinary retention. At this follow-up, we can also discuss management of his left distal/mid ureteral stone. There is no additional intervention necessary currently. He just needs time and antibiotics to convalesce. When the patient was in the PACU recovery area I ordered 1 g of ceftriaxone daily.   Thank you for involving me in this patient's care, please page urology on-call with any further questions or concerns.  Berniece SalinesHERRICK, Kennedie Pardoe W

## 2016-04-12 NOTE — Progress Notes (Signed)
Pharmacy Antibiotic Note  [name] is a 82 y.o. male admitted on 04/12/2016 with suspected uro sepsis.  Pharmacy has been consulted for piperacillin/tazobactam dosing.  Orders entered for multiple antibiotics. Clarified with MD Winona Legato who gave verbal orders to continue piperacillin/tazobactam and discontinue vancomycin and ceftriaxone.  Plan: Piperacillin/tazobactam 3.375 g IV q12h   Temp (24hrs), Avg:100.4 F (38 C), Min:99.1 F (37.3 C), Max:102 F (38.9 C)   Recent Labs Lab 04/12/16 1123 04/12/16 1748  WBC 14.9* 14.5*  CREATININE 3.01*  --   LATICACIDVEN 4.0*  --     CrCl cannot be calculated (Unknown ideal weight.).    Allergies  Allergen Reactions  . Exforge [Amlodipine Besylate-Valsartan] Swelling and Other (See Comments)    Pt states that he is unable to swallow.    . Lisinopril Swelling and Other (See Comments)    Pt states that he is unable to swallow.    . Niacin And Related Swelling and Other (See Comments)    Pt states that he is unable to swallow.      Antimicrobials this admission: Piperacillin/tazobactam 10/16 >>  Vancomycin and CTX one dose 10/16 >>   Dose adjustments this admission:  Microbiology results: 10/16 BCx: Sent 10/16 UCx: Sent  10/16 Sputum: Sent  10/16 MRSA PCR: Sent  Thank you for allowing pharmacy to be a part of this patient's care.  Cindi Carbon, PharmD 04/12/2016 6:08 PM

## 2016-04-12 NOTE — ED Notes (Signed)
Pt completed CT contrast.

## 2016-04-12 NOTE — Procedures (Signed)
Successful [place] and fluoroscopic guided placement of a left sided PCN yielding frank puss.   Sample of aspirated urine/pus sent to lab for analysis. PCN connected to gravity bag. EBL: Minimal No immediate post procedural complications.   Katherina Right, MD Pager #: [phone]

## 2016-04-12 NOTE — ED Notes (Signed)
Repositioned in bed. Warm blankets given.

## 2016-04-12 NOTE — Consult Note (Signed)
Chief Complaint: Obstructive nephrolithiasis with concern for impending urosepsis  Referring Physician(s): Marlou Porch (Urology)  Patient Status: [name] - ED  History of Present Illness: [name] is a 82 y.o. male with past medical history significant for CAD (post myocardial infarction), hypertension, diverticulosis, GI bleed, diabetes and BPH (with chronic Foley catheter insertion) who presented to the [facility] emergency department with chief complaint of confusion and weakness. Patient had reported abdominal pain beginning yesterday though is currently without complaint. CT scan of the abdomen and pelvis performed later today demonstrates an approximately 1.6 cm stone within the distal aspect of the left ureter with associated mild to moderate upstream ureterectasis and pelvocaliectasis.   The patient has been evaluated by Dr. Marlou Porch of the urologic service and request has been made for placement of a left-sided percutaneous nephrostomy catheter given his concern with the patient tolerating general anesthesia and his concern with the technical ability to place a left-sided ureteral stent in the setting of BPH and large ureteral stone.  The patient is accompanied by his wife who serves as his historian.  Past Medical History:  Diagnosis Date  . BPH (benign prostatic hypertrophy)   . Coronary artery disease   . Diverticulosis   . Essential hypertension, benign   . GI bleed 2015  . Hypersomnia with sleep apnea, unspecified   . MI (myocardial infarction)   . Other and unspecified hyperlipidemia   . Pure hypercholesterolemia   . Type II or unspecified type diabetes mellitus without mention of complication, uncontrolled     Past Surgical History:  Procedure Laterality Date  . CARDIAC CATHETERIZATION    . CHOLECYSTECTOMY  1994  . CORONARY ANGIOPLASTY  1996   [place]  . ESOPHAGOGASTRODUODENOSCOPY (EGD) WITH PROPOFOL Left 11/23/2015   Procedure:  ESOPHAGOGASTRODUODENOSCOPY (EGD) WITH PROPOFOL;  Surgeon: [name], MD;  Location[facility];  Service: Endoscopy;  Laterality: Left;  . FLEXIBLE SIGMOIDOSCOPY    . inguinal hernia repair     left inguinal   . TONSILLECTOMY      Allergies: Exforge [amlodipine besylate-valsartan]; Lisinopril; and Niacin and related  Medications: Prior to Admission medications   Medication Sig Start Date End Date Taking? Authorizing Provider  Coenzyme Q10 (CO Q 10) 10 MG CAPS Take by mouth.   Yes Historical Provider, MD  colchicine-probenecid 0.5-500 MG tablet Take 1 tablet by mouth daily. 03/31/16  Yes Historical Provider, MD  finasteride (PROSCAR) 5 MG tablet Take 1 tablet (5 mg total) by mouth daily. 12/02/15  Yes [name], MD  furosemide (LASIX) 40 MG tablet Take 2 tablets every am & 1 tablet every pm.   Yes Historical Provider, MD  glimepiride (AMARYL) 1 MG tablet Take 1 mg by mouth daily with breakfast.  03/02/16 03/02/17 Yes Historical Provider, MD  Insulin Glargine (LANTUS SOLOSTAR) 100 UNIT/ML Solostar Pen Inject 12 Units into the skin daily.  09/11/14  Yes Historical Provider, MD  losartan (COZAAR) 100 MG tablet Take 100 mg by mouth daily. 03/31/16  Yes Historical Provider, MD  MAGNESIUM PO Take by mouth.   Yes Historical Provider, MD  metolazone (ZAROXOLYN) 5 MG tablet Take 5 mg by mouth as needed. Reported on 12/15/2015   Yes Historical Provider, MD  oxyCODONE-acetaminophen (PERCOCET/ROXICET) 5-325 MG tablet Take by mouth every 4 (four) hours as needed for severe pain. Reported on 01/09/2016   Yes Historical Provider, MD  potassium chloride (KLOR-CON) 20 MEQ packet Take 20 mEq by mouth daily.   Yes Historical Provider, MD  sitaGLIPtin-metformin (JANUMET) 50-1000 MG tablet Take 1 tablet by mouth 2 (two) times daily with a meal.   Yes Historical Provider, MD  tamsulosin (FLOMAX) 0.4 MG CAPS capsule Take 1 capsule (0.4 mg total) by mouth daily. 01/09/16  Yes [name], MD     Family  History  Problem Relation Age of Onset  . Rheum arthritis Mother   . Alzheimer's disease Father     Social History   Social History  . Marital status: Married    Spouse name: N/A  . Number of children: N/A  . Years of education: N/A   Social History Main Topics  . Smoking status: Former Smoker    Packs/day: 1.00    Years: 50.00    Types: Cigarettes  . Smokeless tobacco: Never Used  . Alcohol use No  . Drug use: No  . Sexual activity: Not Asked   Other Topics Concern  . None   Social History Narrative  . None    ECOG Status: 2 - Symptomatic, <50% confined to bed  Review of Systems: A 12 point ROS discussed and pertinent positives are indicated in the HPI above.  All other systems are negative.  Review of Systems  Constitutional: Positive for activity change and appetite change. Negative for fever.  Respiratory: Negative.   Cardiovascular: Negative.   Gastrointestinal: Negative.   Genitourinary: Negative.   Psychiatric/Behavioral: Positive for sleep disturbance.    Vital Signs: BP 131/79   Pulse 78   Temp 99.1 F (37.3 C) (Oral)   Resp 16   SpO2 94%   Physical Exam  Constitutional: He appears well-developed and well-nourished.  Cardiovascular: Normal rate and regular rhythm.   Pulmonary/Chest: Effort normal and breath sounds normal.  Abdominal: Soft. Bowel sounds are normal.  No costovertebral angle tenderness.  Psychiatric:  Patient is lethargic but oriented with arousal.  Nursing note and vitals reviewed.   Imaging: Ct Abdomen Pelvis Wo Contrast  Result Date: 04/12/2016 CLINICAL DATA:  confusion and weakness since 6am. Family reports patient complaining of abdominal pain yesterday and taking pain medication and becoming sleepier than usual. Patient feels hot to the touch. Patient is extremely weak and confused. This RN had difficulty getting patient from the vehicle. Hx of cholecystectomy and inguinal hernia repair EXAM: CT ABDOMEN AND PELVIS  WITHOUT CONTRAST TECHNIQUE: Multidetector CT imaging of the abdomen and pelvis was performed following the standard protocol without IV contrast. COMPARISON:  04/30/2009 FINDINGS: Lower chest: Patchy subsegmental atelectasis or scarring posteriorly in the visualized lung bases left greater than right. Coronary calcifications. Four-chamber cardiac enlargement. Blood pool less dense than the interventricular septum suggesting anemia. Hepatobiliary: No focal liver abnormality is seen. Status post cholecystectomy. No biliary dilatation. Pancreas: Mild diffuse atrophy without mass or ductal dilatation. Spleen: Normal in size without focal abnormality. Adrenals/Urinary Tract: Stable 15 mm left adrenal nodule. Moderate left hydronephrosis and marked ureterectasis down to the level of a 15 mm mid ureteral calculus. Foley catheter decompresses the urinary bladder. 8 mm calculus in the lumen of the urinary bladder. Right kidney and adrenal gland unremarkable. Stomach/Bowel: Stomach, small bowel, and colon are nondilated. Scattered descending and sigmoid diverticula. Vascular/Lymphatic: Moderate aortoiliac arterial calcifications. Right common iliac artery dilated 2.2 cm diameter, left also 2.2 cm. Reproductive: Prostatic enlargement.  Foley catheter in place. Other: No ascites.  No free air. Musculoskeletal: Degenerative disc disease L4-5 and L5-S1. Negative for fracture or dislocation. IMPRESSION: 1. Obstructing 15 mm left ureteral calculus. Moderate left hydronephrosis and proximal ureterectasis. 2. 8 mm  urinary bladder calculus. 3. Descending and sigmoid diverticulosis. 4. Cardiomegaly with coronary calcifications. 5. 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 Electronically Signed   By: Corlis Leak  Hassell M.D.   On: 04/12/2016 13:37   Dg Chest Port 1 View  Result Date: 04/12/2016 CLINICAL DATA:  Confusion and weakness since 6 a.m. EXAM: PORTABLE CHEST 1 VIEW COMPARISON:   11/22/2015 FINDINGS: Subtle patchy densities in the left lower lobe appear to be new. Remainder of the lungs are clear. Stable enlargement of the cardiac silhouette. Patient is rotated towards the right which is similar to the prior examination. No acute bone abnormality. IMPRESSION: Subtle densities at the left lung base. Although these findings could be related atelectasis, subtle infection cannot be excluded in this area. Stable cardiomegaly. Electronically Signed   By: Richarda OverlieAdam  Henn M.D.   On: 04/12/2016 11:45    Labs:  CBC:  Recent Labs  11/22/15 1245  11/24/15 0335 11/25/15 0453 01/11/16 0752 04/12/16 1123  WBC 8.7  --  8.5 6.7  --  14.9*  HGB 4.1*  < > 7.7* 8.0* 10.1* 11.0*  HCT 13.2*  --  22.8* 23.9* 30.3* 31.6*  PLT 355  --  228 227  --  175  < > = values in this interval not displayed.  COAGS:  Recent Labs  11/22/15 1245 04/12/16 1123  INR 1.56 1.13  APTT  --  34    BMP:  Recent Labs  11/25/15 0453 12/09/15 1157 01/11/16 0752 04/12/16 1123  NA 141 140 136 131*  K 3.2* 4.1 3.5 4.1  CL 114* 98 102 99*  CO2 21* 23 26 18*  GLUCOSE 155* 187* 160* 209*  BUN 61* 26 25* 42*  CALCIUM 8.5* 8.4* 8.8* 8.6*  CREATININE 1.79* 1.65* 1.41* 3.01*  GFRNONAA 33* 37* 44* 17*  GFRAA 38* 43* 50* 20*    LIVER FUNCTION TESTS:  Recent Labs  11/22/15 1245 04/12/16 1123  BILITOT 0.8 1.4*  AST 31 26  ALT 16* 12*  ALKPHOS 63 74  PROT 7.0 7.9  ALBUMIN 3.0* 3.3*    TUMOR MARKERS: No results for input(s): AFPTM, CEA, CA199, CHROMGRNA in the last 8760 hours.  Assessment and Plan:  Leta BaptistCosmo Lea is a 82 y.o. male with past medical history significant for CAD (post myocardial infarction), hypertension, diverticulosis, GI bleed, diabetes and BPH (with chronic Foley catheter insertion) who presented [facility] department with chief complaint of confusion and weakness. Patient had reported abdominal pain beginning yesterday though is  currently without complaint. CT scan of the abdomen and pelvis performed later today demonstrates an approximately 1.6 cm stone within the distal aspect of the left ureter with associated mild to moderate upstream ureterectasis and pelvocaliectasis.   Risks and benefits of ultrasound fluoroscopic guided left-sided percutaneous nephrostomy catheter placement were discussed with the patient including, but not limited to infection, bleeding, significant bleeding causing loss or decrease in renal function or damage to adjacent structures.   All of the patient's and the patient's wife's questions were answered, and they are agreeable to proceed.  Consent signed (consent was signed by the patient's wife) and in chart.  Thank you for this interesting consult.  I greatly enjoyed meeting [facility] and look forward to participating in their care.  A copy of this report was sent to the requesting provider on this date.  Electronically Signed: Simonne ComeWATTS, Hyde Sires 04/12/2016, 3:55 PM   I spent a total of 20 Minutes in  face to face in clinical consultation, greater than 50% of which was counseling/coordinating care for ultrasound fluoroscopic guided left sided nephrostomy catheter placement

## 2016-04-12 NOTE — ED Notes (Signed)
CODE  SEPSIS  CALLED  TO  THOMAS  AT  [facility] 

## 2016-04-12 NOTE — ED Triage Notes (Signed)
Patient presents to the ED with confusion and weakness since 6am.  Family reports patient complaining of abdominal pain yesterday and taking pain medication and becoming sleepier than usual.  Patient feels hot to the touch.  Patient is extremely weak and confused.  This RN had difficulty getting patient from the vehicle.

## 2016-04-12 NOTE — ED Provider Notes (Signed)
[facility] Emergency Department Provider Note  ____________________________________________  Time seen: Approximately 11:21 AM  I have reviewed the triage vital signs and the nursing notes.   HISTORY  Chief Complaint Abdominal Pain; Extremity Weakness; and Altered Mental Status Level 5 caveat:  Portions of the history and physical were unable to be obtained due to the patient's acute illness and altered mental status    HPI [name] is a 82 y.o. male brought to ED by family due to altered mental status, decreased oral intake, fevers left-sided abdominal pain for the past 2 days.  Patient was given 3-4 Percocets yesterday for his abdominal pain without relief. He is seemed to have decreased alertness and confusion even after this should have worn off. They do know he has a history of diverticulitis which may be related to the abdominal pain. He also has a chronic indwelling Foley catheter due to enlarged prostate that is been in since May 2017. It was last changed about one month ago. No cough, patient denies chest pain.     Past Medical History:  Diagnosis Date  . BPH (benign prostatic hypertrophy)   . Coronary artery disease   . Diverticulosis   . Essential hypertension, benign   . GI bleed 2015  . Hypersomnia with sleep apnea, unspecified   . MI (myocardial infarction)   . Other and unspecified hyperlipidemia   . Pure hypercholesterolemia   . Type II or unspecified type diabetes mellitus without mention of complication, uncontrolled      Patient Active Problem List   Diagnosis Date Noted  . Nephrolithiasis 12/02/2015  . Chronic progressive renal failure, stage 3 (moderate) 12/02/2015  . Urinary retention 12/02/2015  . Chronic gouty arthritis 09/23/2015  . Morbid obesity (HCC) 04/15/2015  . GI bleed 02/21/2015  . Peripheral vascular disease (HCC) 10/24/2014  . Frank hematuria 09/30/2014  . Bradycardia 06/17/2014  . Paroxysmal atrial  fibrillation (HCC) 06/07/2014  . Arteriosclerosis of coronary artery 01/26/2014  . Diabetes mellitus (HCC) 01/26/2014  . Essential hypertension 12/19/2013  . Cardiomyopathy (HCC) 11/21/2013  . Hypercholesterolemia 11/21/2013  . Obstructive apnea 11/21/2013  . Coronary artery disease 07/27/2013  . Status post coronary angioplasty 07/27/2013  . Diabetes mellitus type 2 with complications (HCC) 07/27/2013  . Atrial fibrillation (HCC) 07/27/2013  . Obesity 07/27/2013  . Hyperlipidemia 07/27/2013  . Chronic diastolic CHF (congestive heart failure) (HCC) 07/27/2013  . Lower GI bleed 07/27/2013  . Elevated prostate specific antigen (PSA) 08/07/2012  . Benign prostatic hyperplasia with urinary obstruction 08/07/2012  . ED (erectile dysfunction) of organic origin 08/07/2012     Past Surgical History:  Procedure Laterality Date  . CARDIAC CATHETERIZATION    . CHOLECYSTECTOMY  1994  . CORONARY ANGIOPLASTY  1996   [place]  . ESOPHAGOGASTRODUODENOSCOPY (EGD) WITH PROPOFOL Left 11/23/2015   Procedure: ESOPHAGOGASTRODUODENOSCOPY (EGD) WITH PROPOFOL;  Surgeon: [name], MD;  Location: [facility] ENDOSCOPY;  Service: Endoscopy;  Laterality: Left;  . FLEXIBLE SIGMOIDOSCOPY    . inguinal hernia repair     left inguinal   . TONSILLECTOMY       Prior to Admission medications   Medication Sig Start Date End Date Taking? Authorizing Provider  Coenzyme Q10 (CO Q 10) 10 MG CAPS Take by mouth.   Yes Historical Provider, MD  colchicine-probenecid 0.5-500 MG tablet Take 1 tablet by mouth daily. 03/31/16  Yes Historical Provider, MD  finasteride (PROSCAR) 5 MG tablet Take 1 tablet (5 mg total) by mouth daily. 12/02/15  Yes  Sebastian Acheheodore Manny, MD  furosemide (LASIX) 40 MG tablet Take 2 tablets every am & 1 tablet every pm.   Yes Historical Provider, MD  glimepiride (AMARYL) 1 MG tablet Take 1 mg by mouth daily with breakfast.  03/02/16 03/02/17 Yes Historical Provider, MD  Insulin Glargine (LANTUS SOLOSTAR) 100  UNIT/ML Solostar Pen Inject 12 Units into the skin daily.  09/11/14  Yes Historical Provider, MD  losartan (COZAAR) 100 MG tablet Take 100 mg by mouth daily. 03/31/16  Yes Historical Provider, MD  MAGNESIUM PO Take by mouth.   Yes Historical Provider, MD  metolazone (ZAROXOLYN) 5 MG tablet Take 5 mg by mouth as needed. Reported on 12/15/2015   Yes Historical Provider, MD  oxyCODONE-acetaminophen (PERCOCET/ROXICET) 5-325 MG tablet Take by mouth every 4 (four) hours as needed for severe pain. Reported on 01/09/2016   Yes Historical Provider, MD  potassium chloride (KLOR-CON) 20 MEQ packet Take 20 mEq by mouth daily.   Yes Historical Provider, MD  sitaGLIPtin-metformin (JANUMET) 50-1000 MG tablet Take 1 tablet by mouth 2 (two) times daily with a meal.   Yes Historical Provider, MD  tamsulosin (FLOMAX) 0.4 MG CAPS capsule Take 1 capsule (0.4 mg total) by mouth daily. 01/09/16  Yes Vanna ScotlandAshley Brandon, MD     Allergies Exforge [amlodipine besylate-valsartan]; Lisinopril; and Niacin and related   Family History  Problem Relation Age of Onset  . Rheum arthritis Mother   . Alzheimer's disease Father     Social History Social History  Substance Use Topics  . Smoking status: Former Smoker    Packs/day: 1.00    Years: 50.00    Types: Cigarettes  . Smokeless tobacco: Never Used  . Alcohol use No    Review of Systems  Constitutional:   Subjective fever..   Cardiovascular:   No chest pain. Respiratory:   No dyspnea or cough. Gastrointestinal:   Positive abdominal pain. No vomiting or diarrhea.  Genitourinary:   Chronic Foley. 10-point ROS otherwise negative.  ____________________________________________   PHYSICAL EXAM:  VITAL SIGNS: ED Triage Vitals  Enc Vitals Group     BP      Pulse      Resp      Temp      Temp src      SpO2      Weight      Height      Head Circumference      Peak Flow      Pain Score      Pain Loc      Pain Edu?      Excl. in GC?   Temp 100.5, heart rate  90, blood pressure 150/90. Respirations 24. All soft 88% on room air. 94% on 2 L nasal cannula.  Vital signs reviewed, nursing assessments reviewed.   Constitutional:   Alert and oriented to person and place. Ill-appearing. Eyes:   No scleral icterus. No conjunctival pallor. PERRL. EOMI.  No nystagmus. ENT   Head:   Normocephalic and atraumatic.   Nose:   No congestion/rhinnorhea. No septal hematoma   Mouth/Throat:   MMM, no pharyngeal erythema. No peritonsillar mass.    Neck:   No stridor. No SubQ emphysema. No meningismus. Hematological/Lymphatic/Immunilogical:   No cervical lymphadenopathy. Cardiovascular:   RRR. Symmetric bilateral radial and DP pulses.  No murmurs.  Respiratory:   Normal respiratory effort without tachypnea nor retractions. Breath sounds are clear and equal bilaterally. No wheezes/rales/rhonchi. Gastrointestinal:   Soft with left-sided tenderness. Moderately distended. There is no  CVA tenderness.  No rebound, rigidity, or guarding. Hyperactive bowel sounds Genitourinary:   deferred Musculoskeletal:   Nontender with normal range of motion in all extremities. No joint effusions.  No lower extremity tenderness.  No edema. Neurologic:   Normal speech and language.  CN 2-10 normal. Motor grossly intact. No gross focal neurologic deficits are appreciated.  Skin:    Skin is warm, dry and intact. No rash noted.  No petechiae, purpura, or bullae.  ____________________________________________    LABS (pertinent positives/negatives) (all labs ordered are listed, but only abnormal results are displayed) Labs Reviewed  LACTIC ACID, PLASMA - Abnormal; Notable for the following:       Result Value   Lactic Acid, Venous 4.0 (*)    All other components within normal limits  COMPREHENSIVE METABOLIC PANEL - Abnormal; Notable for the following:    Sodium 131 (*)    Chloride 99 (*)    CO2 18 (*)    Glucose, Bld 209 (*)    BUN 42 (*)    Creatinine, Ser 3.01 (*)     Calcium 8.6 (*)    Albumin 3.3 (*)    ALT 12 (*)    Total Bilirubin 1.4 (*)    GFR calc non Af Amer 17 (*)    GFR calc Af Amer 20 (*)    All other components within normal limits  TROPONIN I - Abnormal; Notable for the following:    Troponin I 0.07 (*)    All other components within normal limits  CBC WITH DIFFERENTIAL/PLATELET - Abnormal; Notable for the following:    WBC 14.9 (*)    RBC 3.71 (*)    Hemoglobin 11.0 (*)    HCT 31.6 (*)    RDW 17.0 (*)    Neutro Abs 12.9 (*)    Lymphs Abs 0.4 (*)    Monocytes Absolute 1.6 (*)    All other components within normal limits  URINALYSIS COMPLETEWITH MICROSCOPIC ([facility] ONLY) - Abnormal; Notable for the following:    Color, Urine RED (*)    APPearance CLOUDY (*)    Hgb urine dipstick 3+ (*)    Protein, ur 100 (*)    Leukocytes, UA 3+ (*)    Bacteria, UA MANY (*)    All other components within normal limits  CULTURE, BLOOD (ROUTINE X 2)  CULTURE, BLOOD (ROUTINE X 2)  CULTURE, EXPECTORATED SPUTUM-ASSESSMENT  URINE CULTURE  LIPASE, BLOOD  APTT  PROTIME-INR  LACTIC ACID, PLASMA   ____________________________________________   EKG  Interpreted by me Sinus rhythm rate of 95, normal axis and intervals. Normal QRS ST segments and T waves.  ____________________________________________    RADIOLOGY  Chest x-ray with small opacities at left base concerning for community acquired pneumonia. CT abdomen pelvis negative for obstruction or perforation but does reveal a 15 mm stone in the left ureter causing obstruction. There is a stone in the bladder as well  ____________________________________________   PROCEDURES Procedures CRITICAL CARE Performed by: Scotty Court, Irven Ingalsbe   Total critical care time: 40 minutes  Critical care time was exclusive of separately billable procedures and treating other patients.  Critical care was necessary to treat or prevent imminent or life-threatening deterioration.  Critical care was  time spent personally by me on the following activities: development of treatment plan with patient and/or surrogate as well as nursing, discussions with consultants, evaluation of patient's response to treatment, examination of patient, obtaining history from patient or surrogate, ordering and performing treatments and interventions, ordering and review  of laboratory studies, ordering and review of radiographic studies, pulse oximetry and re-evaluation of patient's condition.  ____________________________________________   INITIAL IMPRESSION / ASSESSMENT AND PLAN / ED COURSE  Pertinent labs & imaging results that were available during my care of the patient were reviewed by me and considered in my medical decision making (see chart for details).  Patient presents with altered mental status and fever, concern for possibly, located diverticulitis versus urinary tract infection. We'll replace Foley catheter, proceed with sepsis workup. Likely CT abdomen and pelvis. Empiric vancomycin and Zosyn and IV fluid boluses. Patient does have a history of multidrug resistant staph urinary infection from July 2017. This was sensitive to vancomycin on culture.    ----------------------------------------- 1:59 PM on 04/12/2016 -----------------------------------------  Sepsis - Repeat Assessment  Performed at:    1:40pm  Vitals     Blood pressure (!) 146/74, pulse 94, temperature (!) 100.5 F (38.1 C), temperature source Oral, resp. rate 16, SpO2 93 %.  Heart:     Regular rate and rhythm  Lungs:    CTA  Capillary Refill:   <2 sec  Peripheral Pulse:   Radial pulse palpable  Skin:     Normal Color -----------------------------------------------------------------------------    Clinical Course  Comment By Time  Patient family updated. Vital stable. Urology paged for obstructing stone with acute renal failure and urinary tract infection. Sharman Cheek, MD 10/16 [id number]  D/w Urology Dr. Marlou Porch.   Will review case with IR to determine best course of action.  Sharman Cheek, MD 10/16 1423    ----------------------------------------- 3:04 PM on 04/12/2016 -----------------------------------------  Discussed with Dr. Marlou Porch again. Plan is for IR place a nephrostomy tube on the left. This is anticipated to be completed within the next hour. Urology will consult as well. Case discussed with hospitalist for admission. ____________________________________________   FINAL CLINICAL IMPRESSION(S) / ED DIAGNOSES  Final diagnoses:  Severe sepsis (HCC)  Urinary tract infection associated with indwelling urethral catheter, initial encounter (HCC)  Community acquired pneumonia of left lower lobe of lung (HCC)  Acute renal failure, unspecified acute renal failure type (HCC)  Left flank pain  Ureterolithiasis       Portions of this note were generated with dragon dictation software. Dictation errors may occur despite best attempts at proofreading.    Sharman Cheek, MD 04/12/16 [id number]

## 2016-04-13 ENCOUNTER — Inpatient Hospital Stay
Admit: 2016-04-13 | Discharge: 2016-04-13 | Disposition: A | Discharge status: Home / Self Care | Payer: Medicare Other | Attending: Physician Assistant | Admitting: Physician Assistant

## 2016-04-13 ENCOUNTER — Ambulatory Visit: Payer: Medicare Other | Admitting: Cardiovascular Disease

## 2016-04-13 ENCOUNTER — Encounter: Payer: Self-pay | Admitting: Interventional Radiology

## 2016-04-13 DIAGNOSIS — R6520 Severe sepsis without septic shock: Secondary | ICD-10-CM

## 2016-04-13 DIAGNOSIS — N1330 Unspecified hydronephrosis: Secondary | ICD-10-CM

## 2016-04-13 DIAGNOSIS — R7989 Other specified abnormal findings of blood chemistry: Secondary | ICD-10-CM

## 2016-04-13 DIAGNOSIS — N179 Acute kidney failure, unspecified: Secondary | ICD-10-CM

## 2016-04-13 DIAGNOSIS — R109 Unspecified abdominal pain: Secondary | ICD-10-CM

## 2016-04-13 DIAGNOSIS — I429 Cardiomyopathy, unspecified: Secondary | ICD-10-CM

## 2016-04-13 DIAGNOSIS — N201 Calculus of ureter: Secondary | ICD-10-CM

## 2016-04-13 DIAGNOSIS — A419 Sepsis, unspecified organism: Secondary | ICD-10-CM

## 2016-04-13 DIAGNOSIS — R778 Other specified abnormalities of plasma proteins: Secondary | ICD-10-CM

## 2016-04-13 DIAGNOSIS — R748 Abnormal levels of other serum enzymes: Secondary | ICD-10-CM

## 2016-04-13 DIAGNOSIS — N390 Urinary tract infection, site not specified: Secondary | ICD-10-CM

## 2016-04-13 LAB — GLUCOSE, CAPILLARY
GLUCOSE-CAPILLARY: 140 mg/dL — AB (ref 65–99)
Glucose-Capillary: 158 mg/dL — ABNORMAL HIGH (ref 65–99)
Glucose-Capillary: 129 mg/dL — ABNORMAL HIGH (ref 65–99)
Glucose-Capillary: 94 mg/dL (ref 65–99)

## 2016-04-13 LAB — BLOOD CULTURE ID PANEL (REFLEXED)
Acinetobacter baumannii: NOT DETECTED
CANDIDA GLABRATA: NOT DETECTED
CANDIDA KRUSEI: NOT DETECTED
CANDIDA PARAPSILOSIS: NOT DETECTED
CANDIDA TROPICALIS: NOT DETECTED
Candida albicans: NOT DETECTED
Carbapenem resistance: NOT DETECTED
ESCHERICHIA COLI: NOT DETECTED
Enterobacter cloacae complex: NOT DETECTED
Enterobacteriaceae species: DETECTED — AB
Enterococcus species: NOT DETECTED
Haemophilus influenzae: NOT DETECTED
KLEBSIELLA OXYTOCA: NOT DETECTED
Klebsiella pneumoniae: DETECTED — AB
Listeria monocytogenes: NOT DETECTED
NEISSERIA MENINGITIDIS: NOT DETECTED
PROTEUS SPECIES: NOT DETECTED
Pseudomonas aeruginosa: NOT DETECTED
SERRATIA MARCESCENS: NOT DETECTED
STAPHYLOCOCCUS SPECIES: NOT DETECTED
Staphylococcus aureus (BCID): NOT DETECTED
Streptococcus agalactiae: NOT DETECTED
Streptococcus pneumoniae: NOT DETECTED
Streptococcus pyogenes: NOT DETECTED
Streptococcus species: NOT DETECTED

## 2016-04-13 LAB — CBC
HCT: 31.6 % — ABNORMAL LOW (ref 40.0–52.0)
HEMOGLOBIN: 10.8 g/dL — AB (ref 13.0–18.0)
MCH: 28 pg (ref 26.0–34.0)
MCHC: 34.1 g/dL (ref 32.0–36.0)
MCV: 82.1 fL (ref 80.0–100.0)
PLATELETS: 129 10*3/uL — AB (ref 150–440)
RBC: 3.85 MIL/uL — AB (ref 4.40–5.90)
RDW: 17.1 % — ABNORMAL HIGH (ref 11.5–14.5)
WBC: 11.8 10*3/uL — AB (ref 3.8–10.6)

## 2016-04-13 LAB — TROPONIN I
TROPONIN I: 0.57 ng/mL — AB (ref <0.03)
Troponin I: 0.86 ng/mL (ref <0.03)

## 2016-04-13 LAB — BASIC METABOLIC PANEL
ANION GAP: 10 (ref 5–15)
BUN: 45 mg/dL — ABNORMAL HIGH (ref 6–20)
CALCIUM: 7.8 mg/dL — AB (ref 8.9–10.3)
CO2: 21 mmol/L — ABNORMAL LOW (ref 22–32)
CREATININE: 3.06 mg/dL — AB (ref 0.61–1.24)
Chloride: 101 mmol/L (ref 101–111)
GFR, EST AFRICAN AMERICAN: 20 mL/min — AB (ref >60)
GFR, EST NON AFRICAN AMERICAN: 17 mL/min — AB (ref >60)
Glucose, Bld: 146 mg/dL — ABNORMAL HIGH (ref 65–99)
Potassium: 4.2 mmol/L (ref 3.5–5.1)
SODIUM: 132 mmol/L — AB (ref 135–145)

## 2016-04-13 LAB — HEMOGLOBIN A1C
HEMOGLOBIN A1C: 7.3 % — AB (ref 4.8–5.6)
MEAN PLASMA GLUCOSE: 163 mg/dL

## 2016-04-13 MED ORDER — ENOXAPARIN SODIUM 30 MG/0.3ML ~~LOC~~ SOLN
30.0000 mg | Freq: Once | SUBCUTANEOUS | Status: AC
  Administered 2016-04-13: 30 mg via SUBCUTANEOUS
  Filled 2016-04-13: qty 0.3

## 2016-04-13 NOTE — Progress Notes (Signed)
PHARMACY - PHYSICIAN COMMUNICATION CRITICAL VALUE ALERT - BLOOD CULTURE IDENTIFICATION (BCID)  Results for orders placed or performed during the hospital encounter of 04/12/16  Blood Culture ID Panel (Reflexed) (Collected: 04/12/2016 11:23 AM)  Result Value Ref Range   Enterococcus species NOT DETECTED NOT DETECTED   Listeria monocytogenes NOT DETECTED NOT DETECTED   Staphylococcus species NOT DETECTED NOT DETECTED   Staphylococcus aureus NOT DETECTED NOT DETECTED   Streptococcus species NOT DETECTED NOT DETECTED   Streptococcus agalactiae NOT DETECTED NOT DETECTED   Streptococcus pneumoniae NOT DETECTED NOT DETECTED   Streptococcus pyogenes NOT DETECTED NOT DETECTED   Acinetobacter baumannii NOT DETECTED NOT DETECTED   Enterobacteriaceae species DETECTED (A) NOT DETECTED   Enterobacter cloacae complex NOT DETECTED NOT DETECTED   Escherichia coli NOT DETECTED NOT DETECTED   Klebsiella oxytoca NOT DETECTED NOT DETECTED   Klebsiella pneumoniae DETECTED (A) NOT DETECTED   Proteus species NOT DETECTED NOT DETECTED   Serratia marcescens NOT DETECTED NOT DETECTED   Carbapenem resistance NOT DETECTED NOT DETECTED   Haemophilus influenzae NOT DETECTED NOT DETECTED   Neisseria meningitidis NOT DETECTED NOT DETECTED   Pseudomonas aeruginosa NOT DETECTED NOT DETECTED   Candida albicans NOT DETECTED NOT DETECTED   Candida glabrata NOT DETECTED NOT DETECTED   Candida krusei NOT DETECTED NOT DETECTED   Candida parapsilosis NOT DETECTED NOT DETECTED   Candida tropicalis NOT DETECTED NOT DETECTED    Name of physician (or Provider) Contacted: n/a  Changes to prescribed antibiotics required: n/a. Patient is already on Zosyn for pyelonephritis and possible pneumonia. Sputum and urine c/s pending.  Harvir Patry S 04/13/2016  3:37 AM

## 2016-04-13 NOTE — Progress Notes (Signed)
Called by RN taking care of patient that troponin is elevated. Patient has nephrostomy tube put in and being managrd for sepsis. Elevated troponin could be secondary to demand ischemia. Will get cardiology evaluation.

## 2016-04-13 NOTE — Progress Notes (Signed)
1630 Patient is febrile- temp is 100.5. Medicated with 650mg  of Tylenol.

## 2016-04-13 NOTE — Consult Note (Signed)
[facility] CONSULT NOTE    Date: 04/13/2016                  Patient Name:  [name]  MRN: [id number]  DOB: 06/28/1928  Age / Sex: 82 y.o., male         PCP: Kirk Ruths., MD                 Service Requesting Consult: Hospitalisit                 Reason for Consult: Acute renal failure, CKD stage III            History of Present Illness: Patient is a 82 y.o. male with a PMHx of Chronic kidney disease stage III Baseline creatinine 1.7 with EGFR of 38, coronary artery disease, proximal atrial fibrillation, multiple GI bleeds, dilated cardiomyopathy, congestive heart failure, diverticular bleeds, history of urinary retention with Foley catheter placement, obstructive sleep apnea, hypertension, hyperlipidemia, diabetes mellitus type 2, who was admitted to [facility] on 04/12/2016 for evaluation of left-sided flank pain. He was found to have left-sided nephrolithiasis causing obstruction. He is now status post left nephrostomy placement. He was admitted for sepsis syndrome.  Upon admission creatinine was 3.01. It has risen slightly to 3.06 with a BUN of 45.  The patient is producing some urine.  Blood cultures are currently showing Klebsiella and Enterobacter.  Patient is being treated with broad-spectrum anabiotic's including Zosyn. He is also on IV fluid hydration with 0.9 Charles saline at 120 cc per hour. The patient's daughter provides much of the history today.   Medications: Outpatient medications: Prescriptions Prior to Admission  Medication Sig Dispense Refill Last Dose  . Coenzyme Q10 (CO Q 10) 10 MG CAPS Take by mouth.   unknown at unknown  . colchicine-probenecid 0.5-500 MG tablet Take 1 tablet by mouth daily.  1  at unknown  . finasteride (PROSCAR) 5 MG tablet Take 1 tablet (5 mg total) by mouth daily. 90 tablet 3 unknown at unknown  . furosemide (LASIX) 40 MG tablet Take 2 tablets every am & 1 tablet every pm.   unknown at unknown  .  glimepiride (AMARYL) 1 MG tablet Take 1 mg by mouth daily with breakfast.     at unknown  . Insulin Glargine (LANTUS SOLOSTAR) 100 UNIT/ML Solostar Pen Inject 12 Units into the skin daily.    04/12/2016 at Unknown time  . losartan (COZAAR) 100 MG tablet Take 100 mg by mouth daily.  0 04/12/2016 at Unknown time  . MAGNESIUM PO Take by mouth.   unknown at unknown  . metolazone (ZAROXOLYN) 5 MG tablet Take 5 mg by mouth as needed. Reported on 12/15/2015   unknown at unknown  . oxyCODONE-acetaminophen (PERCOCET/ROXICET) 5-325 MG tablet Take by mouth every 4 (four) hours as needed for severe pain. Reported on 01/09/2016   04/12/2016 at Unknown time  . potassium chloride (KLOR-CON) 20 MEQ packet Take 20 mEq by mouth daily.    at unknown  . sitaGLIPtin-metformin (JANUMET) 50-1000 MG tablet Take 1 tablet by mouth 2 (two) times daily with a meal.   unknown at unknown  . tamsulosin (FLOMAX) 0.4 MG CAPS capsule Take 1 capsule (0.4 mg total) by mouth daily. 90 capsule 3  at unknown    Current medications: Current Facility-Administered Medications  Medication Dose Route Frequency Provider Last Rate Last Dose  . 0.9 %  sodium chloride infusion   Intravenous Continuous Theodoro Grist, MD      .  acetaminophen (TYLENOL) tablet 650 mg  650 mg Oral Q6H PRN Theodoro Grist, MD   650 mg at 04/12/16 1724   Or  . acetaminophen (TYLENOL) suppository 650 mg  650 mg Rectal Q6H PRN Theodoro Grist, MD      . docusate sodium (COLACE) capsule 100 mg  100 mg Oral BID Theodoro Grist, MD   100 mg at 04/12/16 2151  . enoxaparin (LOVENOX) injection 30 mg  30 mg Subcutaneous Q24H Theodoro Grist, MD   30 mg at 04/12/16 2152  . finasteride (PROSCAR) tablet 5 mg  5 mg Oral Daily Theodoro Grist, MD   5 mg at 04/12/16 2015  . insulin aspart (novoLOG) injection 0-5 Units  0-5 Units Subcutaneous QHS Theodoro Grist, MD   3 Units at 04/13/16 0753  . insulin aspart (novoLOG) injection 0-9 Units  0-9 Units Subcutaneous TID WC Theodoro Grist, MD   1  Units at 04/13/16 0755  . insulin aspart (novoLOG) injection 3 Units  3 Units Subcutaneous TID WC Theodoro Grist, MD   3 Units at 04/13/16 0754  . insulin glargine (LANTUS) injection 12 Units  12 Units Subcutaneous Daily Theodoro Grist, MD      . ondansetron (ZOFRAN) tablet 4 mg  4 mg Oral Q6H PRN Theodoro Grist, MD       Or  . ondansetron (ZOFRAN) injection 4 mg  4 mg Intravenous Q6H PRN Theodoro Grist, MD      . oxyCODONE-acetaminophen (PERCOCET/ROXICET) 5-325 MG per tablet 1 tablet  1 tablet Oral Q4H PRN Theodoro Grist, MD   1 tablet at 04/13/16 0107  . piperacillin-tazobactam (ZOSYN) IVPB 3.375 g  3.375 g Intravenous Q12H Lenis Noon, RPH   3.375 g at 04/12/16 2151  . sodium chloride flush (NS) 0.9 % injection 3 mL  3 mL Intravenous Q12H Theodoro Grist, MD   3 mL at 04/12/16 2153  . tamsulosin (FLOMAX) capsule 0.4 mg  0.4 mg Oral Daily Theodoro Grist, MD   0.4 mg at 04/12/16 2014      Allergies: Allergies  Allergen Reactions  . Exforge [Amlodipine Besylate-Valsartan] Swelling and Other (See Comments)    Pt states that he is unable to swallow.    . Lisinopril Swelling and Other (See Comments)    Pt states that he is unable to swallow.    . Niacin And Related Swelling and Other (See Comments)    Pt states that he is unable to swallow.        Past Medical History: Past Medical History:  Diagnosis Date  . BPH (benign prostatic hypertrophy)   . Coronary artery disease   . Diverticulosis   . Essential hypertension, benign   . GI bleed 2015  . Hypersomnia with sleep apnea, unspecified   . MI (myocardial infarction)   . Other and unspecified hyperlipidemia   . Pure hypercholesterolemia   . Type II or unspecified type diabetes mellitus without mention of complication, uncontrolled      Past Surgical History: Past Surgical History:  Procedure Laterality Date  . CARDIAC CATHETERIZATION    . CHOLECYSTECTOMY  1994  . CORONARY ANGIOPLASTY  1996   [place]  .  ESOPHAGOGASTRODUODENOSCOPY (EGD) WITH PROPOFOL Left 11/23/2015   Procedure: ESOPHAGOGASTRODUODENOSCOPY (EGD) WITH PROPOFOL;  Surgeon: Fredonia Highland, MD;  Location: [facility] ENDOSCOPY;  Service: Endoscopy;  Laterality: Left;  . FLEXIBLE SIGMOIDOSCOPY    . inguinal hernia repair     left inguinal   . IR GENERIC HISTORICAL  04/12/2016   IR NEPHROSTOMY PLACEMENT  LEFT 04/12/2016 Sandi Mariscal, MD [facility]-INTERV RAD  . TONSILLECTOMY       Family History: Family History  Problem Relation Age of Onset  . Rheum arthritis Mother   . Alzheimer's disease Father      Social History: Social History   Social History  . Marital status: Married    Spouse name: N/A  . Number of children: N/A  . Years of education: N/A   Occupational History  . Not on file.   Social History Main Topics  . Smoking status: Former Smoker    Packs/day: 1.00    Years: 50.00    Types: Cigarettes  . Smokeless tobacco: Never Used  . Alcohol use No  . Drug use: No  . Sexual activity: Not on file   Other Topics Concern  . Not on file   Social History Narrative  . No narrative on file     Review of Systems: Review of Systems  Constitutional: Positive for fever and malaise/fatigue. Negative for chills and weight loss.  HENT: Negative for hearing loss and tinnitus.   Eyes: Negative for blurred vision and double vision.  Respiratory: Negative for cough, hemoptysis and sputum production.   Cardiovascular: Negative for chest pain, palpitations and orthopnea.  Gastrointestinal: Positive for abdominal pain, constipation and nausea. Negative for heartburn and vomiting.  Genitourinary: Positive for flank pain.  Musculoskeletal: Positive for myalgias.  Skin: Negative for itching and rash.  Neurological: Negative for dizziness, focal weakness and headaches.  Endo/Heme/Allergies: Negative for polydipsia. Does not bruise/bleed easily.  Psychiatric/Behavioral: Negative for depression. The patient is not nervous/anxious.       Vital Signs: Blood pressure (!) 126/59, pulse 78, temperature 98.4 F (36.9 C), resp. rate (!) 24, height _0  (1.651 m), weight 81.4 kg (179 lb 7.3 oz), SpO2 94 %.  Weight trends: Filed Weights   04/12/16 2000  Weight: 81.4 kg (179 lb 7.3 oz)    Physical Exam: General: NAD, sitting up in bed  Head: Normocephalic, atraumatic.  Eyes: Anicteric, EOMI  Nose: Mucous membranes moist, not inflammed, nonerythematous.  Throat: Oropharynx nonerythematous, no exudate appreciated.   Neck: Supple, trachea midline.  Lungs:  Charles respiratory effort. Clear to auscultation BL without crackles or wheezes.  Heart: S1S2 irregular  Abdomen:  BS normoactive. Soft, Nondistended, non-tender.  Left sided nephrostomy in place  Extremities: No pretibial edema.  Neurologic: A&O X3, Motor strength is 5/5 in the all 4 extremities  Skin: No visible rashes, scars.    Lab results: Basic Metabolic Panel:  Recent Labs Lab 04/12/16 1123 04/12/16 1748 04/13/16 0340  NA 131*  --  132*  K 4.1  --  4.2  CL 99*  --  101  CO2 18*  --  21*  GLUCOSE 209*  --  146*  BUN 42*  --  45*  CREATININE 3.01* 2.94* 3.06*  CALCIUM 8.6*  --  7.8*    Liver Function Tests:  Recent Labs Lab 04/12/16 1123  AST 26  ALT 12*  ALKPHOS 74  BILITOT 1.4*  PROT 7.9  ALBUMIN 3.3*    Recent Labs Lab 04/12/16 1123  LIPASE 19   No results for input(s): AMMONIA in the last 168 hours.  CBC:  Recent Labs Lab 04/12/16 1123 04/12/16 1748 04/13/16 0340  WBC 14.9* 14.5* 11.8*  NEUTROABS 12.9*  --   --   HGB 11.0* 10.2* 10.8*  HCT 31.6* 30.6* 31.6*  MCV 85.1 80.8 82.1  PLT 175 150 129*    Cardiac Enzymes:  Recent Labs Lab 04/12/16 1123 04/12/16 1748 04/12/16 2255 04/13/16 0340  TROPONINI 0.07* 0.10* 0.40* 0.86*    BNP: Invalid input(s): POCBNP  CBG:  Recent Labs Lab 04/12/16 1818 04/12/16 2144 04/13/16 0714  GLUCAP 159* 152* 140*    Microbiology: Results for orders placed or  performed during the hospital encounter of 04/12/16  Blood Culture (routine x 2)     Status: None (Preliminary result)   Collection Time: 04/12/16 11:23 AM  Result Value Ref Range Status   Specimen Description BLOOD RIGHT AC  Final   Special Requests   Final    BOTTLES DRAWN AEROBIC AND ANAEROBIC AER 4ML ANA 2ML   Culture  Setup Time   Final    GRAM NEGATIVE RODS AEROBIC BOTTLE ONLY CRITICAL VALUE NOTED.  VALUE IS CONSISTENT WITH PREVIOUSLY REPORTED AND CALLED VALUE.    Culture GRAM NEGATIVE RODS  Final   Report Status PENDING  Incomplete  Blood Culture (routine x 2)     Status: None (Preliminary result)   Collection Time: 04/12/16 11:23 AM  Result Value Ref Range Status   Specimen Description BLOOD LEFT AC  Final   Special Requests   Final    BOTTLES DRAWN AEROBIC AND ANAEROBIC AER 5ML ANA 3ML   Culture  Setup Time   Final    Organism ID to follow GRAM NEGATIVE RODS ANAEROBIC BOTTLE ONLY CRITICAL RESULT CALLED TO, READ BACK BY AND VERIFIED WITH: MATT MCBANE AT 0327 ON 04/13/16 [facility]WW CONFIRMED BY TLB    Culture NO GROWTH < 24 HOURS  Final   Report Status PENDING  Incomplete  Blood Culture ID Panel (Reflexed)     Status: Abnormal   Collection Time: 04/12/16 11:23 AM  Result Value Ref Range Status   Enterococcus species NOT DETECTED NOT DETECTED Final   Listeria monocytogenes NOT DETECTED NOT DETECTED Final   Staphylococcus species NOT DETECTED NOT DETECTED Final   Staphylococcus aureus NOT DETECTED NOT DETECTED Final   Streptococcus species NOT DETECTED NOT DETECTED Final   Streptococcus agalactiae NOT DETECTED NOT DETECTED Final   Streptococcus pneumoniae NOT DETECTED NOT DETECTED Final   Streptococcus pyogenes NOT DETECTED NOT DETECTED Final   Acinetobacter baumannii NOT DETECTED NOT DETECTED Final   Enterobacteriaceae species DETECTED (A) NOT DETECTED Final    Comment: CRITICAL RESULT CALLED TO, READ BACK BY AND VERIFIED WITH: MATT MCBANE AT 0327 ON 04/13/16 RWW     Enterobacter cloacae complex NOT DETECTED NOT DETECTED Final   Escherichia coli NOT DETECTED NOT DETECTED Final   Klebsiella oxytoca NOT DETECTED NOT DETECTED Final   Klebsiella pneumoniae DETECTED (A) NOT DETECTED Final    Comment: CRITICAL RESULT CALLED TO, READ BACK BY AND VERIFIED WITH: MATT MCBANE AT 0327 ON 04/13/16 RWW    Proteus species NOT DETECTED NOT DETECTED Final   Serratia marcescens NOT DETECTED NOT DETECTED Final   Carbapenem resistance NOT DETECTED NOT DETECTED Final   Haemophilus influenzae NOT DETECTED NOT DETECTED Final   Neisseria meningitidis NOT DETECTED NOT DETECTED Final   Pseudomonas aeruginosa NOT DETECTED NOT DETECTED Final   Candida albicans NOT DETECTED NOT DETECTED Final   Candida glabrata NOT DETECTED NOT DETECTED Final   Candida krusei NOT DETECTED NOT DETECTED Final   Candida parapsilosis NOT DETECTED NOT DETECTED Final   Candida tropicalis NOT DETECTED NOT DETECTED Final  Aerobic/Anaerobic Culture (surgical/deep wound)     Status: None (Preliminary result)   Collection Time: 04/12/16  4:56 PM  Result Value Ref Range Status  Specimen Description KIDNEY LEFT  Final   Special Requests Charles  Final   Gram Stain   Final    ABUNDANT WBC PRESENT,BOTH PMN AND MONONUCLEAR NO ORGANISMS SEEN Performed at [facility]    Culture PENDING  Incomplete   Report Status PENDING  Incomplete  MRSA PCR Screening     Status: None   Collection Time: 04/12/16  6:19 PM  Result Value Ref Range Status   MRSA by PCR NEGATIVE NEGATIVE Final    Comment:        The GeneXpert MRSA Assay (FDA approved for NASAL specimens only), is one component of a comprehensive MRSA colonization surveillance program. It is not intended to diagnose MRSA infection nor to guide or monitor treatment for MRSA infections.     Coagulation Studies:  Recent Labs  04/12/16 1123  LABPROT 14.6  INR 1.13    Urinalysis:  Recent Labs  04/12/16 1123  COLORURINE RED*  LABSPEC  1.014  PHURINE 5.0  GLUCOSEU NEGATIVE  HGBUR 3+*  BILIRUBINUR NEGATIVE  KETONESUR NEGATIVE  PROTEINUR 100*  NITRITE NEGATIVE  LEUKOCYTESUR 3+*      Imaging: Ct Abdomen Pelvis Wo Contrast  Result Date: 04/12/2016 CLINICAL DATA:  confusion and weakness since 6am. Family reports patient complaining of abdominal pain yesterday and taking pain medication and becoming sleepier than usual. Patient feels hot to the touch. Patient is extremely weak and confused. This RN had difficulty getting patient from the vehicle. Hx of cholecystectomy and inguinal hernia repair EXAM: CT ABDOMEN AND PELVIS WITHOUT CONTRAST TECHNIQUE: Multidetector CT imaging of the abdomen and pelvis was performed following the standard protocol without IV contrast. COMPARISON:  04/30/2009 FINDINGS: Lower chest: Patchy subsegmental atelectasis or scarring posteriorly in the visualized lung bases left greater than right. Coronary calcifications. Four-chamber cardiac enlargement. Blood pool less dense than the interventricular septum suggesting anemia. Hepatobiliary: No focal liver abnormality is seen. Status post cholecystectomy. No biliary dilatation. Pancreas: Mild diffuse atrophy without mass or ductal dilatation. Spleen: Charles in size without focal abnormality. Adrenals/Urinary Tract: Stable 15 mm left adrenal nodule. Moderate left hydronephrosis and marked ureterectasis down to the level of a 15 mm mid ureteral calculus. Foley catheter decompresses the urinary bladder. 8 mm calculus in the lumen of the urinary bladder. Right kidney and adrenal gland unremarkable. Stomach/Bowel: Stomach, small bowel, and colon are nondilated. Scattered descending and sigmoid diverticula. Vascular/Lymphatic: Moderate aortoiliac arterial calcifications. Right common iliac artery dilated 2.2 cm diameter, left also 2.2 cm. Reproductive: Prostatic enlargement.  Foley catheter in place. Other: No ascites.  No free air. Musculoskeletal: Degenerative disc  disease L4-5 and L5-S1. Negative for fracture or dislocation. IMPRESSION: 1. Obstructing 15 mm left ureteral calculus. Moderate left hydronephrosis and proximal ureterectasis. 2. 8 mm urinary bladder calculus. 3. Descending and sigmoid diverticulosis. 4. Cardiomegaly with coronary calcifications. 5. 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 Electronically Signed   By: Corlis Leak M.D.   On: 04/12/2016 13:37   [place] Intraoperative  Result Date: 04/12/2016 CLINICAL DATA:  Ultrasound was provided for use by the ordering physician, and a technical charge was applied by the performing facility.  No radiologist interpretation/professional services rendered.   Dg Chest Port 1 View  Result Date: 04/12/2016 CLINICAL DATA:  Confusion and weakness since 6 a.m. EXAM: PORTABLE CHEST 1 VIEW COMPARISON:  11/22/2015 FINDINGS: Subtle patchy densities in the left lower lobe appear to be new. Remainder of the lungs are clear. Stable enlargement of the cardiac silhouette. Patient is  rotated towards the right which is similar to the prior examination. No acute bone abnormality. IMPRESSION: Subtle densities at the left lung base. Although these findings could be related atelectasis, subtle infection cannot be excluded in this area. Stable cardiomegaly. Electronically Signed   By: Markus Daft M.D.   On: 04/12/2016 11:45   Ir Nephrostomy Placement Left  Result Date: 04/13/2016 INDICATION: Obstructing left-sided ureteral stone with clinical symptoms worrisome for impending urosepsis. Request made for placement of an ultrasound fluoroscopic guided left-sided percutaneous nephrostomy catheter. EXAM: 1. ULTRASOUND GUIDANCE FOR PUNCTURE OF THE LEFT RENAL COLLECTING SYSTEM 2. LEFT PERCUTANEOUS NEPHROSTOMY TUBE PLACEMENT. COMPARISON:  CT the abdomen pelvis - 04/12/2016 MEDICATIONS: The patient is currently admitted to the hospital and receiving intravenous antibiotics; The antibiotic was  administered in an appropriate time frame prior to skin puncture. ANESTHESIA/SEDATION: Moderate (conscious) sedation was employed during this procedure. A total of Fentanyl 75 mcg was administered intravenously. Moderate Sedation Time: 20 minutes. The patient's level of consciousness and vital signs were monitored continuously by radiology nursing throughout the procedure under my direct supervision. CONTRAST:  15 mL Isovue 300 administered into the collecting system FLUOROSCOPY TIME:  1 minute (40 mGy) COMPLICATIONS: None immediate. PROCEDURE: The procedure, risks, benefits, and alternatives were explained to the patient. Questions regarding the procedure were encouraged and answered. The patient understands and consents to the procedure. A timeout was performed prior to the initiation of the procedure. The left flank region was prepped with Betadine in a sterile fashion, and a sterile drape was applied covering the operative field. A sterile gown and sterile gloves were used for the procedure. Local anesthesia was provided with 1% Lidocaine with epinephrine. Ultrasound was used to localize the left kidney. Under direct ultrasound guidance, a 21 gauge needle was advanced into the renal collecting system. An ultrasound image documentation was performed. Access within the collecting system was confirmed with the efflux of urine followed by contrast injection. Over an 0.018 wire, the tract was dilated with an Accustick stent. Over a guide wire, a 10-French percutaneous nephrostomy catheter was advanced into the collecting system where the coil was formed and locked. Contrast was injected and several sport radiographs were obtained in various obliquities confirming access. The catheter was secured at the skin with a Prolene retention suture and a gravity bag was placed. A dressing was placed. The patient tolerated procedure well without immediate postprocedural complication. FINDINGS: Ultrasound scanning demonstrates a  mild to moderately dilated left renal collecting system. Under direct ultrasound guidance, a posterior inferior calix was targeted allowing advancement of an 10-French percutaneous nephrostomy catheter under intermittent fluoroscopic guidance. Following percutaneous nephrostomy catheter, purulent appearing, foul smelling urine was aspirated from the collecting system. A aspirated urine sample was capped and sent to the laboratory for analysis. Contrast injection confirmed appropriate positioning. IMPRESSION: Successful ultrasound and fluoroscopic guided placement of a left sided 10 French PCN yielding purulent urine. A sample of aspirated urine was capped and sent to the laboratory for analysis. Electronically Signed   By: Sandi Mariscal M.D.   On: 04/13/2016 08:17      Assessment & Plan: Pt is a 82 y.o. male  with a PMHx of Chronic kidney disease stage III Baseline creatinine 1.7 with EGFR of 38, coronary artery disease, proximal atrial fibrillation, multiple GI bleeds, dilated cardiomyopathy, congestive heart failure, diverticular bleeds, history of urinary retention with Foley catheter placement, obstructive sleep apnea, hypertension, hyperlipidemia, diabetes mellitus type 2, who was admitted to [facility] on 04/12/2016 for evaluation of  left-sided flank pain. He was found to have left-sided nephrolithiasis causing obstruction. He is now status post left nephrostomy placement.  1.  Acute renal failure/chronic kidney disease stage III/left-sided hydronephrosis and nephrolithiasis. The patient's acute renal failure is multifactorial with contributions from sepsis and hydronephrosis. The patient is at risk for worsening renal function and could potentially require renal placement therapy. This is not urgently required at the moment however. Continue supportive care with IV fluid hydration for now. Monitor renal function closely daily. Also agree with urology consultation.  2. Sepsis. Klebsiella and Enterobacter  growing in the blood. The patient is maintaining on Zosyn at the moment. Consider infectious disease consultation.  3. Anemia of chronic kidney disease. Hemoglobin currently 10.8. No indication for Procrit at the moment.  4. Thanks for consultation.

## 2016-04-13 NOTE — Consult Note (Signed)
[facility] Infectious Disease     Reason for Consult:GNR bacteremia, UTI   Referring Physician: Nicholes Mango Date of Admission:  04/12/2016   Active Problems:   Sepsis ([place])   Lactic acidosis   Acute renal failure (ARF) (HCC)   Ureteral stone with hydronephrosis   Leukocytosis   Acute pyelonephritis   Atelectasis of left lung   HPI: [name] is a 82 y.o. male with recent issues with urinary retention requiring chronic foley admitted with AMS, weakness and L sided abd pain. Found to have obstructive renal stone and UTI with placement of L nephrostomy tube and finding of foul urine.  CX of blood and urine with GNR - ided as Kleb on BCID.  Currently in ICU but off pressors, still confused. Daughter at bedside   Past Medical History:  Diagnosis Date  . BPH (benign prostatic hypertrophy)   . Coronary artery disease   . Diverticulosis   . Essential hypertension, benign   . GI bleed 2015  . Hypersomnia with sleep apnea, unspecified   . MI (myocardial infarction)   . Other and unspecified hyperlipidemia   . Pure hypercholesterolemia   . Type II or unspecified type diabetes mellitus without mention of complication, uncontrolled    Past Surgical History:  Procedure Laterality Date  . CARDIAC CATHETERIZATION    . CHOLECYSTECTOMY  1994  . CORONARY ANGIOPLASTY  1996   [place]  . ESOPHAGOGASTRODUODENOSCOPY (EGD) WITH PROPOFOL Left 11/23/2015   Procedure: ESOPHAGOGASTRODUODENOSCOPY (EGD) WITH PROPOFOL;  Surgeon: Fredonia Highland, MD;  Location: [facility] ENDOSCOPY;  Service: Endoscopy;  Laterality: Left;  . FLEXIBLE SIGMOIDOSCOPY    . inguinal hernia repair     left inguinal   . IR GENERIC HISTORICAL  04/12/2016   IR NEPHROSTOMY PLACEMENT LEFT 04/12/2016 Sandi Mariscal, MD [facility]-INTERV RAD  . TONSILLECTOMY     Social History  Substance Use Topics  . Smoking status: Former Smoker    Packs/day: 1.00    Years: 50.00    Types: Cigarettes  . Smokeless tobacco: Never Used  .  Alcohol use No   Family History  Problem Relation Age of Onset  . Rheum arthritis Mother   . Alzheimer's disease Father     Allergies:  Allergies  Allergen Reactions  . Exforge [Amlodipine Besylate-Valsartan] Swelling and Other (See Comments)    Pt states that he is unable to swallow.    . Lisinopril Swelling and Other (See Comments)    Pt states that he is unable to swallow.    . Niacin And Related Swelling and Other (See Comments)    Pt states that he is unable to swallow.      Current antibiotics: Antibiotics Given (last 72 hours)    Date/Time Action Medication Dose Rate   04/12/16 1829 Given   cefTRIAXone (ROCEPHIN) 1 g in dextrose 5 % 50 mL IVPB 1 g 100 mL/hr   04/12/16 2151 Given   piperacillin-tazobactam (ZOSYN) IVPB 3.375 g 3.375 g 12.5 mL/hr   04/13/16 1044 Given   piperacillin-tazobactam (ZOSYN) IVPB 3.375 g 3.375 g 12.5 mL/hr      MEDICATIONS: . docusate sodium  100 mg Oral BID  . enoxaparin (LOVENOX) injection  30 mg Subcutaneous Q24H  . finasteride  5 mg Oral Daily  . insulin aspart  0-5 Units Subcutaneous QHS  . insulin aspart  0-9 Units Subcutaneous TID WC  . insulin aspart  3 Units Subcutaneous TID WC  . insulin glargine  12 Units Subcutaneous Daily  . piperacillin-tazobactam (ZOSYN)  IV  3.375 g Intravenous Q12H  . sodium chloride flush  3 mL Intravenous Q12H  . tamsulosin  0.4 mg Oral Daily    Review of Systems - unable to obtain  OBJECTIVE: Temp:  [98.3 F (36.8 C)-102 F (38.9 C)] 98.6 F (37 C) (10/17 1200) Pulse Rate:  [37-100] 100 (10/17 1500) Resp:  [7-40] 25 (10/17 1500) BP: (91-199)/(47-110) 107/87 (10/17 1500) SpO2:  [91 %-100 %] 94 % (10/17 1500) Weight:  [81.4 kg (179 lb 7.3 oz)] 81.4 kg (179 lb 7.3 oz) (10/16 2000) Physical Exam  Constitutional: confused HENT: anicteric Mouth/Throat: Oropharynx is clear and dry . No oropharyngeal exudate.  Cardiovascular: Normal rate,  Pulmonary/Chest: Effort normal and breath sounds normal. No  respiratory distress. He has no wheezes.  Abdominal: Soft. Bowel sounds are normal. L nephrostomy tube with cloudy urine Lymphadenopathy: He has no cervical adenopathy.  Neurological: confused Skin: Skin is warm and dry. No rash noted. No erythema.  Psychiatric: He has a normal mood and affect. His behavior is normal.  Foley in place with clear urine  LABS: Results for orders placed or performed during the hospital encounter of 04/12/16 (from the past 48 hour(s))  Lactic acid, plasma     Status: Abnormal   Collection Time: 04/12/16 11:23 AM  Result Value Ref Range   Lactic Acid, Venous 4.0 (HH) 0.5 - 1.9 mmol/L    Comment: CRITICAL RESULT CALLED TO, READ BACK BY AND VERIFIED WITH CASEY PIERCE AT 1237 04/12/16 DAS   Comprehensive metabolic panel     Status: Abnormal   Collection Time: 04/12/16 11:23 AM  Result Value Ref Range   Sodium 131 (L) 135 - 145 mmol/L   Potassium 4.1 3.5 - 5.1 mmol/L   Chloride 99 (L) 101 - 111 mmol/L   CO2 18 (L) 22 - 32 mmol/L   Glucose, Bld 209 (H) 65 - 99 mg/dL   BUN 42 (H) 6 - 20 mg/dL   Creatinine, Ser 3.01 (H) 0.61 - 1.24 mg/dL   Calcium 8.6 (L) 8.9 - 10.3 mg/dL   Total Protein 7.9 6.5 - 8.1 g/dL   Albumin 3.3 (L) 3.5 - 5.0 g/dL   AST 26 15 - 41 U/L   ALT 12 (L) 17 - 63 U/L   Alkaline Phosphatase 74 38 - 126 U/L   Total Bilirubin 1.4 (H) 0.3 - 1.2 mg/dL   GFR calc non Af Amer 17 (L) >60 mL/min   GFR calc Af Amer 20 (L) >60 mL/min    Comment: (NOTE) The eGFR has been calculated using the CKD EPI equation. This calculation has not been validated in all clinical situations. eGFR's persistently <60 mL/min signify possible Chronic Kidney Disease.    Anion gap 14 5 - 15  Lipase, blood     Status: None   Collection Time: 04/12/16 11:23 AM  Result Value Ref Range   Lipase 19 11 - 51 U/L  Troponin I     Status: Abnormal   Collection Time: 04/12/16 11:23 AM  Result Value Ref Range   Troponin I 0.07 (HH) <0.03 ng/mL    Comment: CRITICAL RESULT  CALLED TO, READ BACK BY AND VERIFIED WITH CASEY PIERCE AT 1247 04/12/16 DAS   CBC WITH DIFFERENTIAL     Status: Abnormal   Collection Time: 04/12/16 11:23 AM  Result Value Ref Range   WBC 14.9 (H) 3.8 - 10.6 K/uL   RBC 3.71 (L) 4.40 - 5.90 MIL/uL   Hemoglobin 11.0 (L) 13.0 - 18.0 g/dL  HCT 31.6 (L) 40.0 - 52.0 %   MCV 85.1 80.0 - 100.0 fL   MCH 29.7 26.0 - 34.0 pg   MCHC 34.9 32.0 - 36.0 g/dL   RDW 17.0 (H) 11.5 - 14.5 %   Platelets 175 150 - 440 K/uL   Neutrophils Relative % 86 %   Neutro Abs 12.9 (H) 1.4 - 6.5 K/uL   Lymphocytes Relative 3 %   Lymphs Abs 0.4 (L) 1.0 - 3.6 K/uL   Monocytes Relative 11 %   Monocytes Absolute 1.6 (H) 0.2 - 1.0 K/uL   Eosinophils Relative 0 %   Eosinophils Absolute 0.0 0 - 0.7 K/uL   Basophils Relative 0 %   Basophils Absolute 0.0 0 - 0.1 K/uL  APTT     Status: None   Collection Time: 04/12/16 11:23 AM  Result Value Ref Range   aPTT 34 24 - 36 seconds  Protime-INR     Status: None   Collection Time: 04/12/16 11:23 AM  Result Value Ref Range   Prothrombin Time 14.6 11.4 - 15.2 seconds   INR 1.13   Blood Culture (routine x 2)     Status: None (Preliminary result)   Collection Time: 04/12/16 11:23 AM  Result Value Ref Range   Specimen Description BLOOD RIGHT AC    Special Requests      BOTTLES DRAWN AEROBIC AND ANAEROBIC AER 4ML ANA 2ML   Culture  Setup Time      GRAM NEGATIVE RODS AEROBIC BOTTLE ONLY CRITICAL VALUE NOTED.  VALUE IS CONSISTENT WITH PREVIOUSLY REPORTED AND CALLED VALUE.    Culture GRAM NEGATIVE RODS    Report Status PENDING   Blood Culture (routine x 2)     Status: None (Preliminary result)   Collection Time: 04/12/16 11:23 AM  Result Value Ref Range   Specimen Description BLOOD LEFT AC    Special Requests      BOTTLES DRAWN AEROBIC AND ANAEROBIC AER 5ML ANA 3ML   Culture  Setup Time      Organism ID to follow GRAM NEGATIVE RODS ANAEROBIC BOTTLE ONLY CRITICAL RESULT CALLED TO, READ BACK BY AND VERIFIED WITH: MATT  MCBANE AT 0327 ON 04/13/16 RWW CONFIRMED BY TLB    Culture NO GROWTH < 24 HOURS    Report Status PENDING   Urinalysis complete, with microscopic ([facility] only)     Status: Abnormal   Collection Time: 04/12/16 11:23 AM  Result Value Ref Range   Color, Urine RED (A) YELLOW   APPearance CLOUDY (A) CLEAR   Glucose, UA NEGATIVE NEGATIVE mg/dL   Bilirubin Urine NEGATIVE NEGATIVE   Ketones, ur NEGATIVE NEGATIVE mg/dL   Specific Gravity, Urine 1.014 1.005 - 1.030   Hgb urine dipstick 3+ (A) NEGATIVE   pH 5.0 5.0 - 8.0   Protein, ur 100 (A) NEGATIVE mg/dL   Nitrite NEGATIVE NEGATIVE   Leukocytes, UA 3+ (A) NEGATIVE   RBC / HPF TOO NUMEROUS TO COUNT 0 - 5 RBC/hpf   WBC, UA TOO NUMEROUS TO COUNT 0 - 5 WBC/hpf   Bacteria, UA MANY (A) NONE SEEN   Squamous Epithelial / LPF NONE SEEN NONE SEEN   WBC Clumps PRESENT   Urine culture     Status: Abnormal (Preliminary result)   Collection Time: 04/12/16 11:23 AM  Result Value Ref Range   Specimen Description URINE, RANDOM    Special Requests NONE    Culture >=100,000 COLONIES/mL GRAM NEGATIVE RODS (A)    Report Status PENDING   Blood  Culture ID Panel (Reflexed)     Status: Abnormal   Collection Time: 04/12/16 11:23 AM  Result Value Ref Range   Enterococcus species NOT DETECTED NOT DETECTED   Listeria monocytogenes NOT DETECTED NOT DETECTED   Staphylococcus species NOT DETECTED NOT DETECTED   Staphylococcus aureus NOT DETECTED NOT DETECTED   Streptococcus species NOT DETECTED NOT DETECTED   Streptococcus agalactiae NOT DETECTED NOT DETECTED   Streptococcus pneumoniae NOT DETECTED NOT DETECTED   Streptococcus pyogenes NOT DETECTED NOT DETECTED   Acinetobacter baumannii NOT DETECTED NOT DETECTED   Enterobacteriaceae species DETECTED (A) NOT DETECTED    Comment: CRITICAL RESULT CALLED TO, READ BACK BY AND VERIFIED WITH: MATT MCBANE AT 0327 ON 04/13/16 RWW    Enterobacter cloacae complex NOT DETECTED NOT DETECTED   Escherichia coli NOT DETECTED  NOT DETECTED   Klebsiella oxytoca NOT DETECTED NOT DETECTED   Klebsiella pneumoniae DETECTED (A) NOT DETECTED    Comment: CRITICAL RESULT CALLED TO, READ BACK BY AND VERIFIED WITH: MATT MCBANE AT 0327 ON 04/13/16 RWW    Proteus species NOT DETECTED NOT DETECTED   Serratia marcescens NOT DETECTED NOT DETECTED   Carbapenem resistance NOT DETECTED NOT DETECTED   Haemophilus influenzae NOT DETECTED NOT DETECTED   Neisseria meningitidis NOT DETECTED NOT DETECTED   Pseudomonas aeruginosa NOT DETECTED NOT DETECTED   Candida albicans NOT DETECTED NOT DETECTED   Candida glabrata NOT DETECTED NOT DETECTED   Candida krusei NOT DETECTED NOT DETECTED   Candida parapsilosis NOT DETECTED NOT DETECTED   Candida tropicalis NOT DETECTED NOT DETECTED  Aerobic/Anaerobic Culture (surgical/deep wound)     Status: None (Preliminary result)   Collection Time: 04/12/16  4:56 PM  Result Value Ref Range   Specimen Description KIDNEY LEFT    Special Requests Normal    Gram Stain      ABUNDANT WBC PRESENT,BOTH PMN AND MONONUCLEAR NO ORGANISMS SEEN    Culture      ABUNDANT GRAM NEGATIVE RODS CRITICAL RESULT CALLED TO, READ BACK BY AND VERIFIED WITH: Laurina Bustle RN, AT 1114 04/13/16 BY Rush Landmark Performed at [facility]    Report Status PENDING   Lactic acid, plasma     Status: None   Collection Time: 04/12/16  5:48 PM  Result Value Ref Range   Lactic Acid, Venous 1.2 0.5 - 1.9 mmol/L  CBC     Status: Abnormal   Collection Time: 04/12/16  5:48 PM  Result Value Ref Range   WBC 14.5 (H) 3.8 - 10.6 K/uL   RBC 3.78 (L) 4.40 - 5.90 MIL/uL   Hemoglobin 10.2 (L) 13.0 - 18.0 g/dL   HCT 30.6 (L) 40.0 - 52.0 %   MCV 80.8 80.0 - 100.0 fL   MCH 27.0 26.0 - 34.0 pg   MCHC 33.4 32.0 - 36.0 g/dL   RDW 16.7 (H) 11.5 - 14.5 %   Platelets 150 150 - 440 K/uL  Creatinine, serum     Status: Abnormal   Collection Time: 04/12/16  5:48 PM  Result Value Ref Range   Creatinine, Ser 2.94 (H) 0.61 - 1.24 mg/dL    GFR calc non Af Amer 18 (L) >60 mL/min   GFR calc Af Amer 21 (L) >60 mL/min    Comment: (NOTE) The eGFR has been calculated using the CKD EPI equation. This calculation has not been validated in all clinical situations. eGFR's persistently <60 mL/min signify possible Chronic Kidney Disease.   Troponin I     Status: Abnormal  Collection Time: 04/12/16  5:48 PM  Result Value Ref Range   Troponin I 0.10 (HH) <0.03 ng/mL    Comment: CRITICAL VALUE NOTED. VALUE IS CONSISTENT WITH PREVIOUSLY REPORTED/CALLED VALUE [place]  Hemoglobin A1c     Status: Abnormal   Collection Time: 04/12/16  5:48 PM  Result Value Ref Range   Hgb A1c MFr Bld 7.3 (H) 4.8 - 5.6 %    Comment: (NOTE)         Pre-diabetes: 5.7 - 6.4         Diabetes: >6.4         Glycemic control for adults with diabetes: <7.0    Mean Plasma Glucose 163 mg/dL    Comment: (NOTE) Performed At: [facility] [place], [place] [id number] Lindon Romp MD [id number]   Glucose, capillary     Status: Abnormal   Collection Time: 04/12/16  6:18 PM  Result Value Ref Range   Glucose-Capillary 159 (H) 65 - 99 mg/dL  MRSA PCR Screening     Status: None   Collection Time: 04/12/16  6:19 PM  Result Value Ref Range   MRSA by PCR NEGATIVE NEGATIVE    Comment:        The GeneXpert MRSA Assay (FDA approved for NASAL specimens only), is one component of a comprehensive MRSA colonization surveillance program. It is not intended to diagnose MRSA infection nor to guide or monitor treatment for MRSA infections.   Glucose, capillary     Status: Abnormal   Collection Time: 04/12/16  9:44 PM  Result Value Ref Range   Glucose-Capillary 152 (H) 65 - 99 mg/dL   Comment 1 Notify RN   Troponin I     Status: Abnormal   Collection Time: 04/12/16 10:55 PM  Result Value Ref Range   Troponin I 0.40 (HH) <0.03 ng/mL    Comment: CRITICAL VALUE NOTED. VALUE IS CONSISTENT WITH PREVIOUSLY REPORTED/CALLED VALUE TLB   Troponin I      Status: Abnormal   Collection Time: 04/13/16  3:40 AM  Result Value Ref Range   Troponin I 0.86 (HH) <0.03 ng/mL    Comment: CRITICAL RESULT CALLED TO, READ BACK BY AND VERIFIED WITH BETH BUONO ON 04/13/16 AT 0538 BY TLB   Basic metabolic panel     Status: Abnormal   Collection Time: 04/13/16  3:40 AM  Result Value Ref Range   Sodium 132 (L) 135 - 145 mmol/L   Potassium 4.2 3.5 - 5.1 mmol/L   Chloride 101 101 - 111 mmol/L   CO2 21 (L) 22 - 32 mmol/L   Glucose, Bld 146 (H) 65 - 99 mg/dL   BUN 45 (H) 6 - 20 mg/dL   Creatinine, Ser 3.06 (H) 0.61 - 1.24 mg/dL   Calcium 7.8 (L) 8.9 - 10.3 mg/dL   GFR calc non Af Amer 17 (L) >60 mL/min   GFR calc Af Amer 20 (L) >60 mL/min    Comment: (NOTE) The eGFR has been calculated using the CKD EPI equation. This calculation has not been validated in all clinical situations. eGFR's persistently <60 mL/min signify possible Chronic Kidney Disease.    Anion gap 10 5 - 15  CBC     Status: Abnormal   Collection Time: 04/13/16  3:40 AM  Result Value Ref Range   WBC 11.8 (H) 3.8 - 10.6 K/uL   RBC 3.85 (L) 4.40 - 5.90 MIL/uL   Hemoglobin 10.8 (L) 13.0 - 18.0 g/dL   HCT 31.6 (L) 40.0 -  52.0 %   MCV 82.1 80.0 - 100.0 fL   MCH 28.0 26.0 - 34.0 pg   MCHC 34.1 32.0 - 36.0 g/dL   RDW 17.1 (H) 11.5 - 14.5 %   Platelets 129 (L) 150 - 440 K/uL  Glucose, capillary     Status: Abnormal   Collection Time: 04/13/16  7:14 AM  Result Value Ref Range   Glucose-Capillary 140 (H) 65 - 99 mg/dL  Troponin I     Status: Abnormal   Collection Time: 04/13/16 10:10 AM  Result Value Ref Range   Troponin I 0.57 (HH) <0.03 ng/mL    Comment: CRITICAL VALUE NOTED. VALUE IS CONSISTENT WITH PREVIOUSLY REPORTED/CALLED VALUE [facility]  Glucose, capillary     Status: Abnormal   Collection Time: 04/13/16 11:35 AM  Result Value Ref Range   Glucose-Capillary 158 (H) 65 - 99 mg/dL   No components found for: ESR, C REACTIVE PROTEIN MICRO: Recent Results (from the past 720  hour(s))  Blood Culture (routine x 2)     Status: None (Preliminary result)   Collection Time: 04/12/16 11:23 AM  Result Value Ref Range Status   Specimen Description BLOOD RIGHT AC  Final   Special Requests   Final    BOTTLES DRAWN AEROBIC AND ANAEROBIC AER 4ML ANA 2ML   Culture  Setup Time   Final    GRAM NEGATIVE RODS AEROBIC BOTTLE ONLY CRITICAL VALUE NOTED.  VALUE IS CONSISTENT WITH PREVIOUSLY REPORTED AND CALLED VALUE.    Culture GRAM NEGATIVE RODS  Final   Report Status PENDING  Incomplete  Blood Culture (routine x 2)     Status: None (Preliminary result)   Collection Time: 04/12/16 11:23 AM  Result Value Ref Range Status   Specimen Description BLOOD LEFT AC  Final   Special Requests   Final    BOTTLES DRAWN AEROBIC AND ANAEROBIC AER 5ML ANA 3ML   Culture  Setup Time   Final    Organism ID to follow GRAM NEGATIVE RODS ANAEROBIC BOTTLE ONLY CRITICAL RESULT CALLED TO, READ BACK BY AND VERIFIED WITH: MATT MCBANE AT 0327 ON 04/13/16 RWW CONFIRMED BY TLB    Culture NO GROWTH < 24 HOURS  Final   Report Status PENDING  Incomplete  Urine culture     Status: Abnormal (Preliminary result)   Collection Time: 04/12/16 11:23 AM  Result Value Ref Range Status   Specimen Description URINE, RANDOM  Final   Special Requests NONE  Final   Culture >=100,000 COLONIES/mL GRAM NEGATIVE RODS (A)  Final   Report Status PENDING  Incomplete  Blood Culture ID Panel (Reflexed)     Status: Abnormal   Collection Time: 04/12/16 11:23 AM  Result Value Ref Range Status   Enterococcus species NOT DETECTED NOT DETECTED Final   Listeria monocytogenes NOT DETECTED NOT DETECTED Final   Staphylococcus species NOT DETECTED NOT DETECTED Final   Staphylococcus aureus NOT DETECTED NOT DETECTED Final   Streptococcus species NOT DETECTED NOT DETECTED Final   Streptococcus agalactiae NOT DETECTED NOT DETECTED Final   Streptococcus pneumoniae NOT DETECTED NOT DETECTED Final   Streptococcus pyogenes NOT  DETECTED NOT DETECTED Final   Acinetobacter baumannii NOT DETECTED NOT DETECTED Final   Enterobacteriaceae species DETECTED (A) NOT DETECTED Final    Comment: CRITICAL RESULT CALLED TO, READ BACK BY AND VERIFIED WITH: MATT MCBANE AT 0327 ON 04/13/16 RWW    Enterobacter cloacae complex NOT DETECTED NOT DETECTED Final   Escherichia coli NOT DETECTED NOT DETECTED Final  Klebsiella oxytoca NOT DETECTED NOT DETECTED Final   Klebsiella pneumoniae DETECTED (A) NOT DETECTED Final    Comment: CRITICAL RESULT CALLED TO, READ BACK BY AND VERIFIED WITH: MATT MCBANE AT 0327 ON 04/13/16 RWW    Proteus species NOT DETECTED NOT DETECTED Final   Serratia marcescens NOT DETECTED NOT DETECTED Final   Carbapenem resistance NOT DETECTED NOT DETECTED Final   Haemophilus influenzae NOT DETECTED NOT DETECTED Final   Neisseria meningitidis NOT DETECTED NOT DETECTED Final   Pseudomonas aeruginosa NOT DETECTED NOT DETECTED Final   Candida albicans NOT DETECTED NOT DETECTED Final   Candida glabrata NOT DETECTED NOT DETECTED Final   Candida krusei NOT DETECTED NOT DETECTED Final   Candida parapsilosis NOT DETECTED NOT DETECTED Final   Candida tropicalis NOT DETECTED NOT DETECTED Final  Aerobic/Anaerobic Culture (surgical/deep wound)     Status: None (Preliminary result)   Collection Time: 04/12/16  4:56 PM  Result Value Ref Range Status   Specimen Description KIDNEY LEFT  Final   Special Requests Normal  Final   Gram Stain   Final    ABUNDANT WBC PRESENT,BOTH PMN AND MONONUCLEAR NO ORGANISMS SEEN    Culture   Final    ABUNDANT GRAM NEGATIVE RODS CRITICAL RESULT CALLED TO, READ BACK BY AND VERIFIED WITH: Laurina Bustle RN, AT 1114 04/13/16 BY Rush Landmark Performed at [facility]    Report Status PENDING  Incomplete  MRSA PCR Screening     Status: None   Collection Time: 04/12/16  6:19 PM  Result Value Ref Range Status   MRSA by PCR NEGATIVE NEGATIVE Final    Comment:        The GeneXpert MRSA Assay  (FDA approved for NASAL specimens only), is one component of a comprehensive MRSA colonization surveillance program. It is not intended to diagnose MRSA infection nor to guide or monitor treatment for MRSA infections.     IMAGING: Ct Abdomen Pelvis Wo Contrast  Result Date: 04/12/2016 CLINICAL DATA:  confusion and weakness since 6am. Family reports patient complaining of abdominal pain yesterday and taking pain medication and becoming sleepier than usual. Patient feels hot to the touch. Patient is extremely weak and confused. This RN had difficulty getting patient from the vehicle. Hx of cholecystectomy and inguinal hernia repair EXAM: CT ABDOMEN AND PELVIS WITHOUT CONTRAST TECHNIQUE: Multidetector CT imaging of the abdomen and pelvis was performed following the standard protocol without IV contrast. COMPARISON:  04/30/2009 FINDINGS: Lower chest: Patchy subsegmental atelectasis or scarring posteriorly in the visualized lung bases left greater than right. Coronary calcifications. Four-chamber cardiac enlargement. Blood pool less dense than the interventricular septum suggesting anemia. Hepatobiliary: No focal liver abnormality is seen. Status post cholecystectomy. No biliary dilatation. Pancreas: Mild diffuse atrophy without mass or ductal dilatation. Spleen: Normal in size without focal abnormality. Adrenals/Urinary Tract: Stable 15 mm left adrenal nodule. Moderate left hydronephrosis and marked ureterectasis down to the level of a 15 mm mid ureteral calculus. Foley catheter decompresses the urinary bladder. 8 mm calculus in the lumen of the urinary bladder. Right kidney and adrenal gland unremarkable. Stomach/Bowel: Stomach, small bowel, and colon are nondilated. Scattered descending and sigmoid diverticula. Vascular/Lymphatic: Moderate aortoiliac arterial calcifications. Right common iliac artery dilated 2.2 cm diameter, left also 2.2 cm. Reproductive: Prostatic enlargement.  Foley catheter in  place. Other: No ascites.  No free air. Musculoskeletal: Degenerative disc disease L4-5 and L5-S1. Negative for fracture or dislocation. IMPRESSION: 1. Obstructing 15 mm left ureteral calculus. Moderate left hydronephrosis and proximal  ureterectasis. 2. 8 mm urinary bladder calculus. 3. Descending and sigmoid diverticulosis. 4. Cardiomegaly with coronary calcifications. 5. 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 Electronically Signed   By: Lucrezia Europe M.D.   On: 04/12/2016 13:37   [place] Intraoperative  Result Date: 04/12/2016 CLINICAL DATA:  Ultrasound was provided for use by the ordering physician, and a technical charge was applied by the performing facility.  No radiologist interpretation/professional services rendered.   Dg Chest Port 1 View  Result Date: 04/12/2016 CLINICAL DATA:  Confusion and weakness since 6 a.m. EXAM: PORTABLE CHEST 1 VIEW COMPARISON:  11/22/2015 FINDINGS: Subtle patchy densities in the left lower lobe appear to be new. Remainder of the lungs are clear. Stable enlargement of the cardiac silhouette. Patient is rotated towards the right which is similar to the prior examination. No acute bone abnormality. IMPRESSION: Subtle densities at the left lung base. Although these findings could be related atelectasis, subtle infection cannot be excluded in this area. Stable cardiomegaly. Electronically Signed   By: Markus Daft M.D.   On: 04/12/2016 11:45   Ir Nephrostomy Placement Left  Result Date: 04/13/2016 INDICATION: Obstructing left-sided ureteral stone with clinical symptoms worrisome for impending urosepsis. Request made for placement of an ultrasound fluoroscopic guided left-sided percutaneous nephrostomy catheter. EXAM: 1. ULTRASOUND GUIDANCE FOR PUNCTURE OF THE LEFT RENAL COLLECTING SYSTEM 2. LEFT PERCUTANEOUS NEPHROSTOMY TUBE PLACEMENT. COMPARISON:  CT the abdomen pelvis - 04/12/2016 MEDICATIONS: The patient is currently admitted  to the hospital and receiving intravenous antibiotics; The antibiotic was administered in an appropriate time frame prior to skin puncture. ANESTHESIA/SEDATION: Moderate (conscious) sedation was employed during this procedure. A total of Fentanyl 75 mcg was administered intravenously. Moderate Sedation Time: 20 minutes. The patient's level of consciousness and vital signs were monitored continuously by radiology nursing throughout the procedure under my direct supervision. CONTRAST:  15 mL Isovue 300 administered into the collecting system FLUOROSCOPY TIME:  1 minute (40 mGy) COMPLICATIONS: None immediate. PROCEDURE: The procedure, risks, benefits, and alternatives were explained to the patient. Questions regarding the procedure were encouraged and answered. The patient understands and consents to the procedure. A timeout was performed prior to the initiation of the procedure. The left flank region was prepped with Betadine in a sterile fashion, and a sterile drape was applied covering the operative field. A sterile gown and sterile gloves were used for the procedure. Local anesthesia was provided with 1% Lidocaine with epinephrine. Ultrasound was used to localize the left kidney. Under direct ultrasound guidance, a 21 gauge needle was advanced into the renal collecting system. An ultrasound image documentation was performed. Access within the collecting system was confirmed with the efflux of urine followed by contrast injection. Over an 0.018 wire, the tract was dilated with an Accustick stent. Over a guide wire, a 10-French percutaneous nephrostomy catheter was advanced into the collecting system where the coil was formed and locked. Contrast was injected and several sport radiographs were obtained in various obliquities confirming access. The catheter was secured at the skin with a Prolene retention suture and a gravity bag was placed. A dressing was placed. The patient tolerated procedure well without immediate  postprocedural complication. FINDINGS: Ultrasound scanning demonstrates a mild to moderately dilated left renal collecting system. Under direct ultrasound guidance, a posterior inferior calix was targeted allowing advancement of an 10-French percutaneous nephrostomy catheter under intermittent fluoroscopic guidance. Following percutaneous nephrostomy catheter, purulent appearing, foul smelling urine was aspirated from the collecting system. A aspirated urine  sample was capped and sent to the laboratory for analysis. Contrast injection confirmed appropriate positioning. IMPRESSION: Successful ultrasound and fluoroscopic guided placement of a left sided 10 French PCN yielding purulent urine. A sample of aspirated urine was capped and sent to the laboratory for analysis. Electronically Signed   By: Sandi Mariscal M.D.   On: 04/13/2016 08:17    Assessment:   Kishan Wachsmuth is a 82 y.o. male with obstructive nephrolithiasis and pyelonephritis and bacteremia from Klebsiella PNA.   Recommendations Continue zosyn pending ID and sensitivities WIll need minimun 2 weeks therapy and hopefully will have oral options based on sensitivities.  Will need urologic followup for management of the obstructive stone, and the urinary retention  Thank you very much for allowing me to participate in the care of this patient. Please call with questions.   Cheral Marker. Ola Spurr, MD

## 2016-04-13 NOTE — Care Management (Signed)
Admitted to [facility] with the diagnosis of pyelonephritis / sepsis. Lives with wife, [place] [phone]). Last seen Dr. Einar Crow 03/31/16. No home health. No skilled facility. Takes care of all basic activities of daily living himself, drives. Prescriptions are filled at [facility] on [address] Uses no aids for ambulation.  Nephrostomy tube placed. Foley intact.  Gwenette Greet RN MSN CCM Care Management [phone]

## 2016-04-13 NOTE — Progress Notes (Signed)
Urology Consult Follow Up  Subjective: Remains in ICU. Slight bump in troponin likely secondary to demand ischemia. On Zosyn and ceftriaxone. White count trending down. Blood cultures growing Klebsiella and enterobacter.  Febrile to 102 yesterday evening, low-grade temp since.    Anti-infectives: Anti-infectives    Start     Dose/Rate Route Frequency Ordered Stop   04/12/16 2200  piperacillin-tazobactam (ZOSYN) IVPB 3.375 g     3.375 g 12.5 mL/hr over 240 Minutes Intravenous Every 12 hours 04/12/16 2020     04/12/16 1800  cefTRIAXone (ROCEPHIN) 1 g in dextrose 5 % 50 mL IVPB  Status:  Discontinued     1 g 100 mL/hr over 30 Minutes Intravenous Every 24 hours 04/12/16 1758 04/12/16 1842   04/12/16 1745  cefTRIAXone (ROCEPHIN) 1 g in dextrose 5 % 50 mL IVPB  Status:  Discontinued     1 g 100 mL/hr over 30 Minutes Intravenous Every 24 hours 04/12/16 1741 04/12/16 1758   04/12/16 1130  piperacillin-tazobactam (ZOSYN) IVPB 3.375 g     3.375 g 100 mL/hr over 30 Minutes Intravenous  Once 04/12/16 1121 04/12/16 1217   04/12/16 1130  vancomycin (VANCOCIN) IVPB 1000 mg/200 mL premix     1,000 mg 200 mL/hr over 60 Minutes Intravenous  Once 04/12/16 1121 04/12/16 1316      Current Facility-Administered Medications  Medication Dose Route Frequency Provider Last Rate Last Dose  . 0.9 %  sodium chloride infusion   Intravenous Continuous Katharina Caper, MD 120 mL/hr at 04/13/16 1200    . acetaminophen (TYLENOL) tablet 650 mg  650 mg Oral Q6H PRN Katharina Caper, MD   650 mg at 04/12/16 1724   Or  . acetaminophen (TYLENOL) suppository 650 mg  650 mg Rectal Q6H PRN Katharina Caper, MD      . docusate sodium (COLACE) capsule 100 mg  100 mg Oral BID Katharina Caper, MD   100 mg at 04/13/16 1044  . enoxaparin (LOVENOX) injection 30 mg  30 mg Subcutaneous Q24H Katharina Caper, MD   30 mg at 04/12/16 2152  . finasteride (PROSCAR) tablet 5 mg  5 mg Oral Daily Katharina Caper, MD   5 mg at 04/13/16 1044  . insulin  aspart (novoLOG) injection 0-5 Units  0-5 Units Subcutaneous QHS Katharina Caper, MD   3 Units at 04/13/16 0753  . insulin aspart (novoLOG) injection 0-9 Units  0-9 Units Subcutaneous TID WC Katharina Caper, MD   2 Units at 04/13/16 1154  . insulin aspart (novoLOG) injection 3 Units  3 Units Subcutaneous TID WC Katharina Caper, MD   3 Units at 04/13/16 1153  . insulin glargine (LANTUS) injection 12 Units  12 Units Subcutaneous Daily Katharina Caper, MD   12 Units at 04/13/16 1051  . ondansetron (ZOFRAN) tablet 4 mg  4 mg Oral Q6H PRN Katharina Caper, MD       Or  . ondansetron (ZOFRAN) injection 4 mg  4 mg Intravenous Q6H PRN Katharina Caper, MD      . oxyCODONE-acetaminophen (PERCOCET/ROXICET) 5-325 MG per tablet 1 tablet  1 tablet Oral Q4H PRN Katharina Caper, MD   1 tablet at 04/13/16 0107  . piperacillin-tazobactam (ZOSYN) IVPB 3.375 g  3.375 g Intravenous Q12H Cindi Carbon, RPH   3.375 g at 04/13/16 1044  . sodium chloride flush (NS) 0.9 % injection 3 mL  3 mL Intravenous Q12H Katharina Caper, MD   3 mL at 04/13/16 1000  . tamsulosin (FLOMAX) capsule 0.4 mg  0.4 mg  Oral Daily Katharina Caperima [place], MD   0.4 mg at 04/13/16 1044    Objective: Vital signs in last 24 hours: Temp:  [98.3 F (36.8 C)-102 F (38.9 C)] 98.6 F (37 C) (10/17 1200) Pulse Rate:  [37-98] 73 (10/17 1200) Resp:  [7-40] 21 (10/17 1200) BP: (91-199)/(47-85) 128/58 (10/17 1200) SpO2:  [91 %-100 %] 94 % (10/17 1200) Weight:  [179 lb 7.3 oz (81.4 kg)] 179 lb 7.3 oz (81.4 kg) (10/16 2000)  Intake/Output from previous day: 10/16 0701 - 10/17 0700 In: 100 [IV Piggyback:100] Out: 650 [Urine:650] Intake/Output this shift: Total I/O In: 120 [I.V.:120] Out: 650 [Urine:450; Other:200]   Physical Exam  Constitutional: He is oriented to person, place, and time and well-developed, well-nourished, and in no distress.  HENT:  Head: Normocephalic and atraumatic.  Abdominal: Soft. Bowel sounds are normal.  Genitourinary: Penis normal.   Genitourinary Comments: Foley in place, draining clear urine.  Left nephrostomy draining light pink urine.    Neurological: He is alert and oriented to person, place, and time.  Skin: Skin is warm and dry.  Vitals reviewed.   Lab Results:   Recent Labs  04/12/16 1748 04/13/16 0340  WBC 14.5* 11.8*  HGB 10.2* 10.8*  HCT 30.6* 31.6*  PLT 150 129*   BMET  Recent Labs  04/12/16 1123 04/12/16 1748 04/13/16 0340  NA 131*  --  132*  K 4.1  --  4.2  CL 99*  --  101  CO2 18*  --  21*  GLUCOSE 209*  --  146*  BUN 42*  --  45*  CREATININE 3.01* 2.94* 3.06*  CALCIUM 8.6*  --  7.8*   PT/INR  Recent Labs  04/12/16 1123  LABPROT 14.6  INR 1.13   ABG No results for input(s): PHART, HCO3 in the last 72 hours.  Invalid input(s): PCO2, PO2  Studies/Results: Ct Abdomen Pelvis Wo Contrast  Result Date: 04/12/2016 CLINICAL DATA:  confusion and weakness since 6am. Family reports patient complaining of abdominal pain yesterday and taking pain medication and becoming sleepier than usual. Patient feels hot to the touch. Patient is extremely weak and confused. This RN had difficulty getting patient from the vehicle. Hx of cholecystectomy and inguinal hernia repair EXAM: CT ABDOMEN AND PELVIS WITHOUT CONTRAST TECHNIQUE: Multidetector CT imaging of the abdomen and pelvis was performed following the standard protocol without IV contrast. COMPARISON:  04/30/2009 FINDINGS: Lower chest: Patchy subsegmental atelectasis or scarring posteriorly in the visualized lung bases left greater than right. Coronary calcifications. Four-chamber cardiac enlargement. Blood pool less dense than the interventricular septum suggesting anemia. Hepatobiliary: No focal liver abnormality is seen. Status post cholecystectomy. No biliary dilatation. Pancreas: Mild diffuse atrophy without mass or ductal dilatation. Spleen: Normal in size without focal abnormality. Adrenals/Urinary Tract: Stable 15 mm left adrenal nodule.  Moderate left hydronephrosis and marked ureterectasis down to the level of a 15 mm mid ureteral calculus. Foley catheter decompresses the urinary bladder. 8 mm calculus in the lumen of the urinary bladder. Right kidney and adrenal gland unremarkable. Stomach/Bowel: Stomach, small bowel, and colon are nondilated. Scattered descending and sigmoid diverticula. Vascular/Lymphatic: Moderate aortoiliac arterial calcifications. Right common iliac artery dilated 2.2 cm diameter, left also 2.2 cm. Reproductive: Prostatic enlargement.  Foley catheter in place. Other: No ascites.  No free air. Musculoskeletal: Degenerative disc disease L4-5 and L5-S1. Negative for fracture or dislocation. IMPRESSION: 1. Obstructing 15 mm left ureteral calculus. Moderate left hydronephrosis and proximal ureterectasis. 2. 8 mm urinary bladder calculus. 3. Descending  and sigmoid diverticulosis. 4. Cardiomegaly with coronary calcifications. 5. 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 Electronically Signed   By: Corlis Leak M.D.   On: 04/12/2016 13:37   [place] Intraoperative  Result Date: 04/12/2016 CLINICAL DATA:  Ultrasound was provided for use by the ordering physician, and a technical charge was applied by the performing facility.  No radiologist interpretation/professional services rendered.   Dg Chest Port 1 View  Result Date: 04/12/2016 CLINICAL DATA:  Confusion and weakness since 6 a.m. EXAM: PORTABLE CHEST 1 VIEW COMPARISON:  11/22/2015 FINDINGS: Subtle patchy densities in the left lower lobe appear to be new. Remainder of the lungs are clear. Stable enlargement of the cardiac silhouette. Patient is rotated towards the right which is similar to the prior examination. No acute bone abnormality. IMPRESSION: Subtle densities at the left lung base. Although these findings could be related atelectasis, subtle infection cannot be excluded in this area. Stable cardiomegaly. Electronically  Signed   By: Richarda Overlie M.D.   On: 04/12/2016 11:45   Ir Nephrostomy Placement Left  Result Date: 04/13/2016 INDICATION: Obstructing left-sided ureteral stone with clinical symptoms worrisome for impending urosepsis. Request made for placement of an ultrasound fluoroscopic guided left-sided percutaneous nephrostomy catheter. EXAM: 1. ULTRASOUND GUIDANCE FOR PUNCTURE OF THE LEFT RENAL COLLECTING SYSTEM 2. LEFT PERCUTANEOUS NEPHROSTOMY TUBE PLACEMENT. COMPARISON:  CT the abdomen pelvis - 04/12/2016 MEDICATIONS: The patient is currently admitted to the hospital and receiving intravenous antibiotics; The antibiotic was administered in an appropriate time frame prior to skin puncture. ANESTHESIA/SEDATION: Moderate (conscious) sedation was employed during this procedure. A total of Fentanyl 75 mcg was administered intravenously. Moderate Sedation Time: 20 minutes. The patient's level of consciousness and vital signs were monitored continuously by radiology nursing throughout the procedure under my direct supervision. CONTRAST:  15 mL Isovue 300 administered into the collecting system FLUOROSCOPY TIME:  1 minute (40 mGy) COMPLICATIONS: None immediate. PROCEDURE: The procedure, risks, benefits, and alternatives were explained to the patient. Questions regarding the procedure were encouraged and answered. The patient understands and consents to the procedure. A timeout was performed prior to the initiation of the procedure. The left flank region was prepped with Betadine in a sterile fashion, and a sterile drape was applied covering the operative field. A sterile gown and sterile gloves were used for the procedure. Local anesthesia was provided with 1% Lidocaine with epinephrine. Ultrasound was used to localize the left kidney. Under direct ultrasound guidance, a 21 gauge needle was advanced into the renal collecting system. An ultrasound image documentation was performed. Access within the collecting system was  confirmed with the efflux of urine followed by contrast injection. Over an 0.018 wire, the tract was dilated with an Accustick stent. Over a guide wire, a 10-French percutaneous nephrostomy catheter was advanced into the collecting system where the coil was formed and locked. Contrast was injected and several sport radiographs were obtained in various obliquities confirming access. The catheter was secured at the skin with a Prolene retention suture and a gravity bag was placed. A dressing was placed. The patient tolerated procedure well without immediate postprocedural complication. FINDINGS: Ultrasound scanning demonstrates a mild to moderately dilated left renal collecting system. Under direct ultrasound guidance, a posterior inferior calix was targeted allowing advancement of an 10-French percutaneous nephrostomy catheter under intermittent fluoroscopic guidance. Following percutaneous nephrostomy catheter, purulent appearing, foul smelling urine was aspirated from the collecting system. A aspirated urine sample was capped and sent to the laboratory for  analysis. Contrast injection confirmed appropriate positioning. IMPRESSION: Successful ultrasound and fluoroscopic guided placement of a left sided 10 French PCN yielding purulent urine. A sample of aspirated urine was capped and sent to the laboratory for analysis. Electronically Signed   By: [name] M.D.   On: 04/13/2016 08:17    Assessment: 82 year old male admitted with urosepsis secondary to left large obstructing ureteral stone postprocedure: Status post left nephrostomy tube placement. Improving.  History of chronic urinary retention managed with an indwelling Foley, bladder stone incidentally noted on CT scan.  Plan: -Continue IV antibiotics, just as needed based on culture and sensitivity data -Maintain Foley catheter nephrostomy tube to drainage -Will arrange for outpatient follow-up to discuss definitive management of the stone and  urinary retention once infection is been adequately treated -Urology will sign off, please contact with [place] any questions or concerns   LOS: 1 day    [name] 04/13/2016

## 2016-04-13 NOTE — Progress Notes (Signed)
[facility] [place] [facility]   PATIENT NAME: [name]    MR#:  [id number]  DATE OF BIRTH:  Nov 22, 1927  SUBJECTIVE:  CHIEF COMPLAINT:  Patient is out of bed to chair. Intermittent episodes of confusion but better than yesterday. Denies any chest pain or shortness of breath Daughter and grandson are at bedside  REVIEW OF SYSTEMS:  CONSTITUTIONAL: No fever, fatigue . Reporting weakness.  EYES: No blurred or double vision.  EARS, NOSE, AND THROAT: No tinnitus or ear pain.  RESPIRATORY: No cough, shortness of breath, wheezing or hemoptysis.  CARDIOVASCULAR: No chest pain, orthopnea, edema.  GASTROINTESTINAL: No nausea, vomiting, diarrhea or abdominal pain.  GENITOURINARY: No dysuria, hematuria.  ENDOCRINE: No polyuria, nocturia,  HEMATOLOGY: No anemia, easy bruising or bleeding SKIN: No rash or lesion. MUSCULOSKELETAL: Some left flank pain No joint pain or arthritis.   NEUROLOGIC: No tingling, numbness, weakness.  PSYCHIATRY: No anxiety or depression.   DRUG ALLERGIES:   Allergies  Allergen Reactions  . Exforge [Amlodipine Besylate-Valsartan] Swelling and Other (See Comments)    Pt states that he is unable to swallow.    . Lisinopril Swelling and Other (See Comments)    Pt states that he is unable to swallow.    . Niacin And Related Swelling and Other (See Comments)    Pt states that he is unable to swallow.      VITALS:  Blood pressure (!) 128/58, pulse 73, temperature 98.6 F (37 C), resp. rate (!) 21, height 5\' 5"  (1.651 m), weight 81.4 kg (179 lb 7.3 oz), SpO2 94 %.  PHYSICAL EXAMINATION:  GENERAL:  82 y.o.-year-old patient lying in the bed with no acute distress.  EYES: Pupils equal, round, reactive to light and accommodation. No scleral icterus. Extraocular muscles intact.  HEENT: Head atraumatic, normocephalic. Oropharynx and nasopharynx clear.  NECK:  Supple, no jugular venous distention. No thyroid enlargement, no tenderness.   LUNGS: Normal breath sounds bilaterally, no wheezing, rales,rhonchi or crepitation. No use of accessory muscles of respiration.  CARDIOVASCULAR: S1, S2 normal. No murmurs, rubs, or gallops.  ABDOMEN: Soft, nontender, nondistended.Left flank tenderness improved Bowel sounds present. No organomegaly or mass. Serosanguineous discharge in the drain EXTREMITIES: No pedal edema, cyanosis, or clubbing.  NEUROLOGIC: Cranial nerves II through XII are intact. Muscle strength at his baseline Sensation intact. Gait not checked.  PSYCHIATRIC: The patient is alert and oriented x 2-3 SKIN: No obvious rash, lesion, or ulcer.    LABORATORY PANEL:   CBC  Recent Labs Lab 04/13/16 0340  WBC 11.8*  HGB 10.8*  HCT 31.6*  PLT 129*   ------------------------------------------------------------------------------------------------------------------  Chemistries   Recent Labs Lab 04/12/16 1123  04/13/16 0340  NA 131*  --  132*  K 4.1  --  4.2  CL 99*  --  101  CO2 18*  --  21*  GLUCOSE 209*  --  146*  BUN 42*  --  45*  CREATININE 3.01*  < > 3.06*  CALCIUM 8.6*  --  7.8*  AST 26  --   --   ALT 12*  --   --   ALKPHOS 74  --   --   BILITOT 1.4*  --   --   < > = values in this interval not displayed. ------------------------------------------------------------------------------------------------------------------  Cardiac Enzymes  Recent Labs Lab 04/13/16 1010  TROPONINI 0.57*   ------------------------------------------------------------------------------------------------------------------  RADIOLOGY:  Ct Abdomen Pelvis Wo Contrast  Result Date: 04/12/2016 CLINICAL DATA:  confusion and weakness since  6am. Family reports patient complaining of abdominal pain yesterday and taking pain medication and becoming sleepier than usual. Patient feels hot to the touch. Patient is extremely weak and confused. This RN had difficulty getting patient from the vehicle. Hx of cholecystectomy and  inguinal hernia repair EXAM: CT ABDOMEN AND PELVIS WITHOUT CONTRAST TECHNIQUE: Multidetector CT imaging of the abdomen and pelvis was performed following the standard protocol without IV contrast. COMPARISON:  04/30/2009 FINDINGS: Lower chest: Patchy subsegmental atelectasis or scarring posteriorly in the visualized lung bases left greater than right. Coronary calcifications. Four-chamber cardiac enlargement. Blood pool less dense than the interventricular septum suggesting anemia. Hepatobiliary: No focal liver abnormality is seen. Status post cholecystectomy. No biliary dilatation. Pancreas: Mild diffuse atrophy without mass or ductal dilatation. Spleen: Normal in size without focal abnormality. Adrenals/Urinary Tract: Stable 15 mm left adrenal nodule. Moderate left hydronephrosis and marked ureterectasis down to the level of a 15 mm mid ureteral calculus. Foley catheter decompresses the urinary bladder. 8 mm calculus in the lumen of the urinary bladder. Right kidney and adrenal gland unremarkable. Stomach/Bowel: Stomach, small bowel, and colon are nondilated. Scattered descending and sigmoid diverticula. Vascular/Lymphatic: Moderate aortoiliac arterial calcifications. Right common iliac artery dilated 2.2 cm diameter, left also 2.2 cm. Reproductive: Prostatic enlargement.  Foley catheter in place. Other: No ascites.  No free air. Musculoskeletal: Degenerative disc disease L4-5 and L5-S1. Negative for fracture or dislocation. IMPRESSION: 1. Obstructing 15 mm left ureteral calculus. Moderate left hydronephrosis and proximal ureterectasis. 2. 8 mm urinary bladder calculus. 3. Descending and sigmoid diverticulosis. 4. Cardiomegaly with coronary calcifications. 5. 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 Electronically Signed   By: Corlis Leak  Hassell M.D.   On: 04/12/2016 13:37   [place] Intraoperative  Result Date: 04/12/2016 CLINICAL DATA:  Ultrasound was provided for use by  the ordering physician, and a technical charge was applied by the performing facility.  No radiologist interpretation/professional services rendered.   Dg Chest Port 1 View  Result Date: 04/12/2016 CLINICAL DATA:  Confusion and weakness since 6 a.m. EXAM: PORTABLE CHEST 1 VIEW COMPARISON:  11/22/2015 FINDINGS: Subtle patchy densities in the left lower lobe appear to be new. Remainder of the lungs are clear. Stable enlargement of the cardiac silhouette. Patient is rotated towards the right which is similar to the prior examination. No acute bone abnormality. IMPRESSION: Subtle densities at the left lung base. Although these findings could be related atelectasis, subtle infection cannot be excluded in this area. Stable cardiomegaly. Electronically Signed   By: Richarda OverlieAdam  Henn M.D.   On: 04/12/2016 11:45   Ir Nephrostomy Placement Left  Result Date: 04/13/2016 INDICATION: Obstructing left-sided ureteral stone with clinical symptoms worrisome for impending urosepsis. Request made for placement of an ultrasound fluoroscopic guided left-sided percutaneous nephrostomy catheter. EXAM: 1. ULTRASOUND GUIDANCE FOR PUNCTURE OF THE LEFT RENAL COLLECTING SYSTEM 2. LEFT PERCUTANEOUS NEPHROSTOMY TUBE PLACEMENT. COMPARISON:  CT the abdomen pelvis - 04/12/2016 MEDICATIONS: The patient is currently admitted to the hospital and receiving intravenous antibiotics; The antibiotic was administered in an appropriate time frame prior to skin puncture. ANESTHESIA/SEDATION: Moderate (conscious) sedation was employed during this procedure. A total of Fentanyl 75 mcg was administered intravenously. Moderate Sedation Time: 20 minutes. The patient's level of consciousness and vital signs were monitored continuously by radiology nursing throughout the procedure under my direct supervision. CONTRAST:  15 mL Isovue 300 administered into the collecting system FLUOROSCOPY TIME:  1 minute (40 mGy) COMPLICATIONS: None immediate. PROCEDURE: The  procedure, risks,  benefits, and alternatives were explained to the patient. Questions regarding the procedure were encouraged and answered. The patient understands and consents to the procedure. A timeout was performed prior to the initiation of the procedure. The left flank region was prepped with Betadine in a sterile fashion, and a sterile drape was applied covering the operative field. A sterile gown and sterile gloves were used for the procedure. Local anesthesia was provided with 1% Lidocaine with epinephrine. Ultrasound was used to localize the left kidney. Under direct ultrasound guidance, a 21 gauge needle was advanced into the renal collecting system. An ultrasound image documentation was performed. Access within the collecting system was confirmed with the efflux of urine followed by contrast injection. Over an 0.018 wire, the tract was dilated with an Accustick stent. Over a guide wire, a 10-French percutaneous nephrostomy catheter was advanced into the collecting system where the coil was formed and locked. Contrast was injected and several sport radiographs were obtained in various obliquities confirming access. The catheter was secured at the skin with a Prolene retention suture and a gravity bag was placed. A dressing was placed. The patient tolerated procedure well without immediate postprocedural complication. FINDINGS: Ultrasound scanning demonstrates a mild to moderately dilated left renal collecting system. Under direct ultrasound guidance, a posterior inferior calix was targeted allowing advancement of an 10-French percutaneous nephrostomy catheter under intermittent fluoroscopic guidance. Following percutaneous nephrostomy catheter, purulent appearing, foul smelling urine was aspirated from the collecting system. A aspirated urine sample was capped and sent to the laboratory for analysis. Contrast injection confirmed appropriate positioning. IMPRESSION: Successful ultrasound and fluoroscopic  guided placement of a left sided 10 French PCN yielding purulent urine. A sample of aspirated urine was capped and sent to the laboratory for analysis. Electronically Signed   By: [name] M.D.   On: 04/13/2016 08:17    EKG:   Orders placed or performed during the hospital encounter of 04/12/16  . EKG 12-Lead  . EKG 12-Lead  . EKG 12-Lead  . EKG 12-Lead    ASSESSMENT AND PLAN:    #1. Sepsis due to acute pyelonephritis/obstructive stone-leading to metabolic encephalopathy Clinically improving Status post left-sided stent placement with a drain  continue IV fluids,  Continue broad-spectrum with Zosyn and add levofloxacin antibiotic therap Follow-up blood cultures, urine cultures and sputum cultures, we will narrow the antibiotics Depending on culture results Blood cultures with klebsiella at this time  #2. Lactic acidosis,  follow lactic acid level 4.0-1.2    Continue hydration, antibiotic therapy  #3. Acute renal failure due to infection and obstructive stone, with hydronephrosis Status post left-sided nephrostomy placement yesterday by interventional radiology urology's recommendation to continue IV fluids and antibiotics and outpatient follow-up  continue IV fluids, follow creatinine Appreciate nephrology recommendations    #4. Left lung atelectasis, questionable pneumonia Incentive spirometry   on broad-spectrum IV antibiotics Sputum cultures are pending    All the records are reviewed and case discussed with Care Management/Social Workerr. Management plans discussed with the patient, daughter and grandson at bedside  and they are in agreement. daughter with several questions on questions were answered to her satisfaction  CODE STATUS: fc  TOTAL TIME TAKING CARE OF THIS PATIENT: 45 minutes.   POSSIBLE D/C IN 2-3 DAYS, DEPENDING ON CLINICAL CONDITION.  Note: This dictation was prepared with Dragon dictation along with smaller phrase technology. Any transcriptional  errors that result from this process are unintentional.   Ramonita Lab M.D on 04/13/2016 at 2:04 PM  Between 7am  to 6pm - Pager - [phone] After 6pm go to [email or web address] - password [email or web address] [facility]  [place]  Office  [phone]  CC: Primary care physician; Lauro Regulus., MD

## 2016-04-13 NOTE — Progress Notes (Signed)
Prime doc on call was notified of  a-flutter cardiac rhythm. Patient is asymptomatic and no signs of acute distress.we  will cont. monitor.

## 2016-04-13 NOTE — Consult Note (Signed)
Cardiology Consultation Note  Patient ID: [name], MRN: [id number], DOB/AGE: [birth year] 80 y.o. Admit date: 04/12/2016   Date of Consult: 04/13/2016 Primary Physician: [name]., MD Primary Cardiologist: Dr. Mariah Milling, MD Requesting Physician: Dr. Tobi Bastos, MD  Chief Complaint: Left flank  pain Reason for Consult: Elevated troponin   HPI: 82 y.o. male with h/o CAD s/p remote PTCA of a diagonal branch and OM in 1996, PAF not on long term full-dose anticoagulation 2/2 multiple GI bleeds requiring blood transfusion, chronic combined CHF, dilated cardiomyopathy, diverticular bleeds, urinary retention, CKD stage II-III, OSA not on CPAP, HTN, HLD, and DM who presented to [facility] with left-sided flank pain and was found to be septic in the setting of anobstructing urinary stone s/p left-sided nephrostomy tube. Cardiology is consulted to elevated troponin.   He was last seen by Dr. Mariah Frazier on 03/16/16 for evaluation of bradycardia on Coreg. His Coreg was held at that time with appropriate chronotropic response. Prior admission in 03/2014 for diverticular bleed and Afib. Seen by GI without colo. He was not started on anticoagulation at that time given GI bleed and anemia. He was seen in the outpatient setting on started on Eliquis 2.5 mg bid. This ended up being held for hospital admission in 10/2015 with a profound anemia with a hgb of 4.1 requiring multiple blood transfusions. Nuclear medicine bleeding scan was negative. Prior hospital admissions for urinary retention leading to hydronephrosis and bump in renal function. As an outpatient he has been advised to take Lasix 40 mg q AM and 20 mg q Pm with prn metolazone. He has not been taking any metolazone. Echo from 2014 showed an EF of 45-50%, mild biatrial enlargement, mild to moderate TR, PASP 68 mmHg. Most recent echo from 06/2013 showed an EF of 50-55%, mild LVH, mildly dilated LA, mild TR, PASP 48 mmHg.   He presented to [facility] on 04/12/16 with a  1-2 day history of confusion, somnolence, and left-sided flank pain. Never with any chest pain or SOB. He was found to have an obstructing left-sided urinary stone, now s/p nephrostomy tube. Upon the patient's arrival to [facility] they were found to have WBC 14.9-->11.8, hgb 11.0-->10.8, PLT 175-->129, SCr 3.01-->3.06 (baseline 1.4-1.6), K+ 4.1, troponin 0.07-->0.10-->0.40-->0.86. ECG non-acute as below, CXR showed subtle densities of the left base. CT abdomen and pelvis showed an obstructing 15 mm left ureteral calculous with moderate left hydronephrosis and proximal ureterectasis. 8 mm urinary bladder calculous, cardiomegaly with coronary calcifications, bilateral 2.2 cm common iliac artery aneurysms.   Past Medical History:  Diagnosis Date  . BPH (benign prostatic hypertrophy)   . Coronary artery disease   . Diverticulosis   . Essential hypertension, benign   . GI bleed 2015  . Hypersomnia with sleep apnea, unspecified   . MI (myocardial infarction)   . Other and unspecified hyperlipidemia   . Pure hypercholesterolemia   . Type II or unspecified type diabetes mellitus without mention of complication, uncontrolled       Most Recent Cardiac Studies: Echo 06/2013: EF 45-50%, LV normal size, RV normal size and function, mild biatrial enalargement, mild to moderate TR, PASP 68 mmHg.    Surgical History:  Past Surgical History:  Procedure Laterality Date  . CARDIAC CATHETERIZATION    . CHOLECYSTECTOMY  1994  . CORONARY ANGIOPLASTY  1996   [place]  . ESOPHAGOGASTRODUODENOSCOPY (EGD) WITH PROPOFOL Left 11/23/2015   Procedure: ESOPHAGOGASTRODUODENOSCOPY (EGD) WITH PROPOFOL;  Surgeon: Charles Ribas, MD;  Location: [facility] ENDOSCOPY;  Service: Endoscopy;  Laterality: Left;  . FLEXIBLE SIGMOIDOSCOPY    . inguinal hernia repair     left inguinal   . IR GENERIC HISTORICAL  04/12/2016   IR NEPHROSTOMY PLACEMENT LEFT 04/12/2016 Charles Come, MD [facility]-INTERV RAD  . TONSILLECTOMY       Home  Meds: Prior to Admission medications   Medication Sig Start Date [name] Date Taking? Authorizing Provider  Coenzyme Q10 (CO Q 10) 10 MG CAPS Take by mouth.   Yes Historical Provider, MD  colchicine-probenecid 0.5-500 MG tablet Take 1 tablet by mouth daily. 03/31/16  Yes Historical Provider, MD  finasteride (PROSCAR) 5 MG tablet Take 1 tablet (5 mg total) by mouth daily. 12/02/15  Yes Charles Ache, MD  furosemide (LASIX) 40 MG tablet Take 2 tablets every am & 1 tablet every pm.   Yes Historical Provider, MD  glimepiride (AMARYL) 1 MG tablet Take 1 mg by mouth daily with breakfast.  03/02/16 03/02/17 Yes Historical Provider, MD  Insulin Glargine (LANTUS SOLOSTAR) 100 UNIT/ML Solostar Pen Inject 12 Units into the skin daily.  09/11/14  Yes Historical Provider, MD  losartan (COZAAR) 100 MG tablet Take 100 mg by mouth daily. 03/31/16  Yes Historical Provider, MD  MAGNESIUM PO Take by mouth.   Yes Historical Provider, MD  metolazone (ZAROXOLYN) 5 MG tablet Take 5 mg by mouth as needed. Reported on 12/15/2015   Yes Historical Provider, MD  oxyCODONE-acetaminophen (PERCOCET/ROXICET) 5-325 MG tablet Take by mouth every 4 (four) hours as needed for severe pain. Reported on 01/09/2016   Yes Historical Provider, MD  potassium chloride (KLOR-CON) 20 MEQ packet Take 20 mEq by mouth daily.   Yes Historical Provider, MD  sitaGLIPtin-metformin (JANUMET) 50-1000 MG tablet Take 1 tablet by mouth 2 (two) times daily with a meal.   Yes Historical Provider, MD  tamsulosin (FLOMAX) 0.4 MG CAPS capsule Take 1 capsule (0.4 mg total) by mouth daily. 01/09/16  Yes Charles Scotland, MD    Inpatient Medications:  . docusate sodium  100 mg Oral BID  . enoxaparin (LOVENOX) injection  30 mg Subcutaneous Q24H  . finasteride  5 mg Oral Daily  . insulin aspart  0-5 Units Subcutaneous QHS  . insulin aspart  0-9 Units Subcutaneous TID WC  . insulin aspart  3 Units Subcutaneous TID WC  . insulin glargine  12 Units Subcutaneous Daily  .  piperacillin-tazobactam (ZOSYN)  IV  3.375 g Intravenous Q12H  . sodium chloride flush  3 mL Intravenous Q12H  . tamsulosin  0.4 mg Oral Daily   . sodium chloride      Allergies:  Allergies  Allergen Reactions  . Exforge [Amlodipine Besylate-Valsartan] Swelling and Other (See Comments)    Pt states that he is unable to swallow.    . Lisinopril Swelling and Other (See Comments)    Pt states that he is unable to swallow.    . Niacin And Related Swelling and Other (See Comments)    Pt states that he is unable to swallow.      Social History   Social History  . Marital status: Married    Spouse name: N/A  . Number of children: N/A  . Years of education: N/A   Occupational History  . Not on file.   Social History Main Topics  . Smoking status: Former Smoker    Packs/day: 1.00    Years: 50.00    Types: Cigarettes  . Smokeless tobacco: Never Used  . Alcohol use No  . Drug use: No  .  Sexual activity: Not on file   Other Topics Concern  . Not on file   Social History Narrative  . No narrative on file     Family History  Problem Relation Age of Onset  . Rheum arthritis Mother   . Alzheimer's disease Father      Review of Systems: Review of Systems  Constitutional: Positive for malaise/fatigue. Negative for chills, diaphoresis, fever and weight loss.  HENT: Negative for congestion.   Eyes: Negative for discharge and redness.  Respiratory: Negative for cough, sputum production, shortness of breath and wheezing.   Cardiovascular: Negative for chest pain, palpitations, orthopnea, claudication, leg swelling and PND.  Gastrointestinal: Negative for abdominal pain, blood in stool, heartburn, melena, nausea and vomiting.  Genitourinary: Positive for dysuria, flank pain, frequency and urgency. Negative for hematuria.  Musculoskeletal: Negative for falls and myalgias.  Skin: Negative for rash.  Neurological: Positive for weakness. Negative for dizziness, tingling, tremors,  sensory change, speech change, focal weakness and loss of consciousness.  Endo/Heme/Allergies: Does not bruise/bleed easily.  Psychiatric/Behavioral: Negative for substance abuse. The patient is not nervous/anxious.   All other systems reviewed and are negative.   Labs:  Recent Labs  04/12/16 1123 04/12/16 1748 04/12/16 2255 04/13/16 0340  TROPONINI 0.07* 0.10* 0.40* 0.86*   Lab Results  Component Value Date   WBC 11.8 (H) 04/13/2016   HGB 10.8 (L) 04/13/2016   HCT 31.6 (L) 04/13/2016   MCV 82.1 04/13/2016   PLT 129 (L) 04/13/2016    Recent Labs Lab 04/12/16 1123  04/13/16 0340  NA 131*  --  132*  K 4.1  --  4.2  CL 99*  --  101  CO2 18*  --  21*  BUN 42*  --  45*  CREATININE 3.01*  < > 3.06*  CALCIUM 8.6*  --  7.8*  PROT 7.9  --   --   BILITOT 1.4*  --   --   ALKPHOS 74  --   --   ALT 12*  --   --   AST 26  --   --   GLUCOSE 209*  --  146*  < > = values in this interval not displayed. No results found for: CHOL, HDL, LDLCALC, TRIG No results found for: DDIMER  Radiology/Studies:  Ct Abdomen Pelvis Wo Contrast  Result Date: 04/12/2016 CLINICAL DATA:  confusion and weakness since 6am. Family reports patient complaining of abdominal pain yesterday and taking pain medication and becoming sleepier than usual. Patient feels hot to the touch. Patient is extremely weak and confused. This RN had difficulty getting patient from the vehicle. Hx of cholecystectomy and inguinal hernia repair EXAM: CT ABDOMEN AND PELVIS WITHOUT CONTRAST TECHNIQUE: Multidetector CT imaging of the abdomen and pelvis was performed following the standard protocol without IV contrast. COMPARISON:  04/30/2009 FINDINGS: Lower chest: Patchy subsegmental atelectasis or scarring posteriorly in the visualized lung bases left greater than right. Coronary calcifications. Four-chamber cardiac enlargement. Blood pool less dense than the interventricular septum suggesting anemia. Hepatobiliary: No focal liver  abnormality is seen. Status post cholecystectomy. No biliary dilatation. Pancreas: Mild diffuse atrophy without mass or ductal dilatation. Spleen: Normal in size without focal abnormality. Adrenals/Urinary Tract: Stable 15 mm left adrenal nodule. Moderate left hydronephrosis and marked ureterectasis down to the level of a 15 mm mid ureteral calculus. Foley catheter decompresses the urinary bladder. 8 mm calculus in the lumen of the urinary bladder. Right kidney and adrenal gland unremarkable. Stomach/Bowel: Stomach, small bowel, and colon  are nondilated. Scattered descending and sigmoid diverticula. Vascular/Lymphatic: Moderate aortoiliac arterial calcifications. Right common iliac artery dilated 2.2 cm diameter, left also 2.2 cm. Reproductive: Prostatic enlargement.  Foley catheter in place. Other: No ascites.  No free air. Musculoskeletal: Degenerative disc disease L4-5 and L5-S1. Negative for fracture or dislocation. IMPRESSION: 1. Obstructing 15 mm left ureteral calculus. Moderate left hydronephrosis and proximal ureterectasis. 2. 8 mm urinary bladder calculus. 3. Descending and sigmoid diverticulosis. 4. Cardiomegaly with coronary calcifications. 5. 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 Electronically Signed   By: Corlis Leak M.D.   On: 04/12/2016 13:37   [place] Intraoperative  Result Date: 04/12/2016 CLINICAL DATA:  Ultrasound was provided for use by the ordering physician, and a technical charge was applied by the performing facility.  No radiologist interpretation/professional services rendered.   Dg Chest Port 1 View  Result Date: 04/12/2016 CLINICAL DATA:  Confusion and weakness since 6 a.m. EXAM: PORTABLE CHEST 1 VIEW COMPARISON:  11/22/2015 FINDINGS: Subtle patchy densities in the left lower lobe appear to be new. Remainder of the lungs are clear. Stable enlargement of the cardiac silhouette. Patient is rotated towards the right which is similar to  the prior examination. No acute bone abnormality. IMPRESSION: Subtle densities at the left lung base. Although these findings could be related atelectasis, subtle infection cannot be excluded in this area. Stable cardiomegaly. Electronically Signed   By: Richarda Overlie M.D.   On: 04/12/2016 11:45   Ir Nephrostomy Placement Left  Result Date: 04/13/2016 INDICATION: Obstructing left-sided ureteral stone with clinical symptoms worrisome for impending urosepsis. Request made for placement of an ultrasound fluoroscopic guided left-sided percutaneous nephrostomy catheter. EXAM: 1. ULTRASOUND GUIDANCE FOR PUNCTURE OF THE LEFT RENAL COLLECTING SYSTEM 2. LEFT PERCUTANEOUS NEPHROSTOMY TUBE PLACEMENT. COMPARISON:  CT the abdomen pelvis - 04/12/2016 MEDICATIONS: The patient is currently admitted to the hospital and receiving intravenous antibiotics; The antibiotic was administered in an appropriate time frame prior to skin puncture. ANESTHESIA/SEDATION: Moderate (conscious) sedation was employed during this procedure. A total of Fentanyl 75 mcg was administered intravenously. Moderate Sedation Time: 20 minutes. The patient's level of consciousness and vital signs were monitored continuously by radiology nursing throughout the procedure under my direct supervision. CONTRAST:  15 mL Isovue 300 administered into the collecting system FLUOROSCOPY TIME:  1 minute (40 mGy) COMPLICATIONS: None immediate. PROCEDURE: The procedure, risks, benefits, and alternatives were explained to the patient. Questions regarding the procedure were encouraged and answered. The patient understands and consents to the procedure. A timeout was performed prior to the initiation of the procedure. The left flank region was prepped with Betadine in a sterile fashion, and a sterile drape was applied covering the operative field. A sterile gown and sterile gloves were used for the procedure. Local anesthesia was provided with 1% Lidocaine with epinephrine.  Ultrasound was used to localize the left kidney. Under direct ultrasound guidance, a 21 gauge needle was advanced into the renal collecting system. An ultrasound image documentation was performed. Access within the collecting system was confirmed with the efflux of urine followed by contrast injection. Over an 0.018 wire, the tract was dilated with an Accustick stent. Over a guide wire, a 10-French percutaneous nephrostomy catheter was advanced into the collecting system where the coil was formed and locked. Contrast was injected and several sport radiographs were obtained in various obliquities confirming access. The catheter was secured at the skin with a Prolene retention suture and a gravity bag was placed. A dressing  was placed. The patient tolerated procedure well without immediate postprocedural complication. FINDINGS: Ultrasound scanning demonstrates a mild to moderately dilated left renal collecting system. Under direct ultrasound guidance, a posterior inferior calix was targeted allowing advancement of an 10-French percutaneous nephrostomy catheter under intermittent fluoroscopic guidance. Following percutaneous nephrostomy catheter, purulent appearing, foul smelling urine was aspirated from the collecting system. A aspirated urine sample was capped and sent to the laboratory for analysis. Contrast injection confirmed appropriate positioning. IMPRESSION: Successful ultrasound and fluoroscopic guided placement of a left sided 10 French PCN yielding purulent urine. A sample of aspirated urine was capped and sent to the laboratory for analysis. Electronically Signed   By: [name] M.D.   On: 04/13/2016 08:17    EKG: Interpreted by me showed: NSR, 95 bpm, early repolarization, poor R wave progression, nonspecific st/t changes Telemetry: Interpreted by me showed: NSR, 80's bpm, PVCs  Weights: Filed Weights   04/12/16 2000  Weight: 179 lb 7.3 oz (81.4 kg)     Physical Exam: Blood pressure (!)  100/54, pulse 74, temperature 99.4 F (37.4 C), resp. rate 17, height 5\' 5"  (1.651 m), weight 179 lb 7.3 oz (81.4 kg), SpO2 98 %. Body mass index is 29.86 kg/m. General: Well developed, well nourished, in no acute distress. Head: Normocephalic, atraumatic, sclera non-icteric, no xanthomas, nares are without discharge.  Neck: Negative for carotid bruits. JVD not elevated. Lungs: Clear bilaterally to auscultation without wheezes, rales, or rhonchi. Breathing is unlabored. Heart: RRR with S1 S2. No murmurs, rubs, or gallops appreciated. Abdomen: Soft, non-tender, non-distended with normoactive bowel sounds. No hepatomegaly. No rebound/guarding. No obvious abdominal masses. Msk:  Strength and tone appear normal for age. Extremities: No clubbing or cyanosis. No edema. Distal pedal pulses are 2+ and equal bilaterally. Neuro: Alert and oriented X 3. No facial asymmetry. No focal deficit. Moves all extremities spontaneously. Psych:  Responds to questions appropriately with a normal affect.    Assessment and Plan:  Active Problems:   Sepsis (HCC)   Lactic acidosis   Acute renal failure (ARF) (HCC)   Ureteral stone with hydronephrosis   Leukocytosis   Acute pyelonephritis   Atelectasis of left lung    1. Elevated troponin: Never with any chest pain or SOB. No changes from his baseline sedentary lifestyle. 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 Trend troponin to peak. Check echo to evaluate LV systolic function. If stable echo compared to most recent in 2015 that showed an EF of 45-50% could plan for outpatient nuclear stress testing, if indicated, as an outpatient. Given his comorbidities, may be best severed with conservative treatment. If echo shows newly reduced EF or WMA may need cardiac cath prior to discharge, however would need to stabilize renal function prior to any intervention.   2.  Sepsis 2/2 obstructing urinary stone: Status post nephrostomy tube. Pain improving. Per IM.   3. Acute on CKD stage II-III: IV fluids. Trend. Avoid nephrotoxic agents. Would be judicious with IV fluids given his sepsis and renal dysfunction.   4. CAD as above: No symptoms concerning for angina. Continue medial treatment. Not on beta blocker as an outpatient given prior bradycardia. Not on ASA given multiple issues with GIB. Losartan on hold 2/2 hypotension.   5. PAF: Currently in sinus rhythm. No on BB as above. Not on long term, full-dose anticoagulation 2/2 multiple prior issues with GIB. CHADS2VASc at least 6 (  CHF, HTN, age x 2, DM, vascular disease). He is aware of stroke risk.   6. Chronic combined CHF/dilated cardiomyopathy: SOB stable. Not on BB as above. Losartan held 2/2 hypotension. Torsemide and metolazone held given hypotension and acute on CKD as above. Restart when able. CHF education.   7. HTN: BP soft currently. IV fluids.   8. HLD: Statin intolerant.   9. OSA: CPAP usage advised.    Signed, Charles Listenyan Dunn, PA-C [facility] Pager: [phone] 04/13/2016, 8:20 AM   Addendum: I have seen and examined the patient and agree with the findings and plan, as documented in the PA/NP's Note.  Briefly, the patient is an 82 year old man with history of paroxysmal atrial fibrillation cardiomyopathy, and coronary artery disease admitted with altered mental status and found to be septic with obstructing renal stone and pyelonephrosis. He is status post nephrostomy tube remains in the ICU. Mild troponin elevation was noted, which in the setting of his acute illness and acute kidney injury, is nonspecific. His exam is unremarkable with exception of lethargy. Breath sounds are diminished at the bases. He has a regular rate and rhythm without murmurs. 1+ lower extremity edema is evident without JVD. We will obtain a transthoracic echocardiogram to reassess his LV function. He is not a  candidate for cardiac catheterization at the moment given his sepsis and acute kidney injury. If his echocardiogram is significantly abnormal, he may benefit from myocardial perfusion stress test as an outpatient. Agree with further recommendations, as outlined in Mr. Telford NabDunn's note.  Charles Kendallhristopher Mekel Haverstock, MD [facility] Pager: [phone]

## 2016-04-13 NOTE — Evaluation (Signed)
Physical Therapy Evaluation Patient Details Name: [name] MRN: [id number] DOB: [birth year] Today's Date: 04/13/2016   History of Present Illness  Pt. is a 82 y.o. male presenting with confusion, weakness, and abdominal pain. Admitted to [facility] wtih sepsis secondary to pyelonephritis, nephrostomy tube placed currently in addition to chronic foley. Pt. elevated troponin levels attributed to demand ischemia following peak levels.   Clinical Impression  Pt. In bed upon arrival, provides verbalization indicating he is oriented to self, date, and situation however, demonstrates delayed/limited verbal responses throughout session and follows primarily simple commands. Wife present at beginning and end of session, assisted with hx and PLOF information. Pt. Demonstrates generalized weakness BUE strength grossly 4-/5 and BLE strength grossly 3/5, no asymmetries noted. Pt. Requires mod A for bed mobility +2 for safety and equipment. Pt. Able to perform sit<>stand transfer with use of RW min A +2 for safety. Pt. Able to perform about 2 feet of gait with use of RW mod A to transfer from EOB to [facility] mod A +2, during this transfer pt. Demonstrates short step length/height B, and requires vc's for stepping and assist for RW negotiation, some LLE buckling was noted during this transfer however, pt. Was able to complete a second transfer of approx. Equal distance with similar gait pattern without evidence of buckling in LLE. Pt. Also demonstrates shaking/tremor like movements in BUE's throughout standing/ambulation. Would benefit from skilled PT to address above deficits and promote optimal return to PLOF Recommend SNF placement upon d/c to follow up with further need for skilled PT     Follow Up Recommendations SNF    Equipment Recommendations       Recommendations for Other Services       Precautions / Restrictions Precautions Precautions: Fall Restrictions Weight Bearing Restrictions: No       Mobility  Bed Mobility Overal bed mobility: Needs Assistance;+ 2 for safety/equipment Bed Mobility: Supine to Sit     Supine to sit: Mod assist     General bed mobility comments: Pt. able to assist with LE movements toward EOB and handrail with vc's, requires mod A to achieve trunk movement to upright positioning.   Transfers Overall transfer level: Needs assistance Equipment used: Rolling walker (2 wheeled) Transfers: Sit to/from Stand Sit to Stand: +2 safety/equipment;Min assist         General transfer comment: Pt. able to perform multiple sit<>stands from both EOB and BSC, able to push from seating surface and transition UE's to RW without LOB.  Ambulation/Gait Ambulation/Gait assistance: Mod assist;+2 physical assistance Ambulation Distance (Feet): 4 Feet Assistive device: Rolling walker (2 wheeled)       General Gait Details: Pt. able to ambulate approx. 69feet x2 to transfer from EOB to [facility] and then to chair. Pt. able to take multiple short steps to complete each transfer demonstrating decreased step length/height B, overall flexed posturing, requires vc's for stepping, direction, and assist for RW negotiation. During first transfer attempt pt. appeared to have some buckling in the LLE however he was able to complete second transfer without evidence of buckling through LLE. Given no asymmetries noted in isolated strength testing or sit<>stand transfers curious if this occurence was situational but will continue to monitor.   Stairs            Wheelchair Mobility    Modified Rankin (Stroke Patients Only)       Balance Overall balance assessment: Needs assistance Sitting-balance support: Feet unsupported;Feet supported Sitting balance-Leahy Scale: Fair Sitting balance - Comments:  Pt. able to maintain midline/upright sitting balance with BUE and BLE supported at EOB.    Standing balance support: Bilateral upper extremity supported Standing balance-Leahy  Scale: Fair Standing balance comment: Pt. relies on RW for UE support during standing activities                             Pertinent Vitals/Pain Pain Assessment: No/denies pain    Home Living Family/patient expects to be discharged to:: Private residence Living Arrangements: Spouse/significant other Available Help at Discharge: Family Type of Home: House Home Access: Stairs to enter Entrance Stairs-Rails: Right Entrance Stairs-Number of Steps: 4 Home Layout: One level Home Equipment: None      Prior Function Level of Independence: Independent         Comments: Pt. does not use AD at baseline however, wife mentions that pt. ambulates primarily within home and reaches for furniture or walls for UE support with mobility      Hand Dominance        Extremity/Trunk Assessment   Upper Extremity Assessment: Generalized weakness (Pt. demonstrates grossly 4-/5 B UE strength, no noted asymmetry, sensation intact. )           Lower Extremity Assessment: Generalized weakness (Pt. demonstrates grossly 3/5 B LE strength, no sensation deficits. )         Communication   Communication: No difficulties  Cognition Arousal/Alertness: Awake/alert Behavior During Therapy: WFL for tasks assessed/performed (pt. primarily follows simple commands.) Overall Cognitive Status: Within Functional Limits for tasks assessed                      General Comments      Exercises Other Exercises Other Exercises: Pt. requested use of BSC during session; pt. performed approx. 2 feet of gait [facility] mod A x2 for safety and assist. Pt. demonstrates short step length/height B and requires vc's for stepping and assist for RW negotiation. Pt. able to perform sit<>stand min A +2 safety with RW for bathroom hygeine.    Assessment/Plan    PT Assessment Patient needs continued PT services  PT Problem List Decreased strength;Decreased balance;Decreased  mobility;Decreased range of motion;Decreased activity tolerance;Decreased safety awareness;Decreased knowledge of use of DME          PT Treatment Interventions DME instruction;Gait training;Stair training;Therapeutic exercise;Therapeutic activities;Neuromuscular re-education;Balance training;Functional mobility training;Patient/family education    PT Goals (Current goals can be found in the Care Plan section)  Acute Rehab PT Goals Patient Stated Goal: Pt. would like to regain his strength PT Goal Formulation: With patient Time For Goal Achievement: 04/27/16 Potential to Achieve Goals: Good    Frequency Min 2X/week   Barriers to discharge Inaccessible home environment      Co-evaluation               End of Session Equipment Utilized During Treatment: Gait belt Activity Tolerance: Patient tolerated treatment well Patient left: in chair;with call bell/phone within reach;with family/visitor present (no chair alarm present, nsg informed of pt. position, wife in room. )           Time: 1341-1420 PT Time Calculation (min) (ACUTE ONLY): 39 min   Charges:         PT G Codes:        Huntley DecLeah Ludwin Flahive, SPT  04/13/16,3:51 PM

## 2016-04-13 NOTE — Progress Notes (Signed)
Physically patient is much be better.Afebrile. Worked with PT. Up in chair x 4 hours. But remains confused to sittuation date and time.Argumentative with wife and nurse. Yelling at wife. Attemting out of bed.

## 2016-04-13 NOTE — Progress Notes (Signed)
Pharmacy Antibiotic Note  [name] is a 82 y.o. male admitted on 04/12/2016 with suspected uro sepsis.  Pharmacy has been consulted for piperacillin/tazobactam dosing.  Plan: Piperacillin/tazobactam 3.375 g IV q12h.  Temp (24hrs), Avg:99.5 F (37.5 C), Min:98.3 F (36.8 C), Max:102 F (38.9 C)   Recent Labs Lab 04/12/16 1123 04/12/16 1748 04/13/16 0340  WBC 14.9* 14.5* 11.8*  CREATININE 3.01* 2.94* 3.06*  LATICACIDVEN 4.0* 1.2  --     Estimated Creatinine Clearance: 17 mL/min (by C-G formula based on SCr of 3.06 mg/dL (H)).    Allergies  Allergen Reactions  . Exforge [Amlodipine Besylate-Valsartan] Swelling and Other (See Comments)    Pt states that he is unable to swallow.    . Lisinopril Swelling and Other (See Comments)    Pt states that he is unable to swallow.    . Niacin And Related Swelling and Other (See Comments)    Pt states that he is unable to swallow.      Antimicrobials this admission: Piperacillin/tazobactam 10/16 >>  Vancomycin and CTX one dose 10/16 >>  10/16  Dose adjustments this admission:  Microbiology results: 10/16 BCx: BCID - Klebsiella pneumoniae  10/16 UCx: GNR 10/16 Sputum: Sent  10/16 MRSA PCR: negative   Pharmacy will continue to monitor and adjust per protocol.   [name] 04/13/2016 2:36 PM

## 2016-04-13 NOTE — Progress Notes (Signed)
*  PRELIMINARY RESULTS* Echocardiogram 2D Echocardiogram has been performed.  Cristela Blue 04/13/2016, 2:54 PM

## 2016-04-13 NOTE — Progress Notes (Signed)
1745 Report called to Anabella RobinsonRN

## 2016-04-14 LAB — URINE CULTURE: Culture: >=100000 — AB

## 2016-04-14 LAB — ECHOCARDIOGRAM COMPLETE
HEIGHTINCHES: 65 in
WEIGHTICAEL: 2871.27 [oz_av]

## 2016-04-14 LAB — BASIC METABOLIC PANEL
Anion gap: 8 (ref 5–15)
BUN: 50 mg/dL — AB (ref 6–20)
CHLORIDE: 104 mmol/L (ref 101–111)
CO2: 19 mmol/L — AB (ref 22–32)
CREATININE: 3.04 mg/dL — AB (ref 0.61–1.24)
Calcium: 7.4 mg/dL — ABNORMAL LOW (ref 8.9–10.3)
GFR calc Af Amer: 20 mL/min — ABNORMAL LOW (ref >60)
GFR calc non Af Amer: 17 mL/min — ABNORMAL LOW (ref >60)
GLUCOSE: 119 mg/dL — AB (ref 65–99)
POTASSIUM: 3.6 mmol/L (ref 3.5–5.1)
SODIUM: 131 mmol/L — AB (ref 135–145)

## 2016-04-14 LAB — GLUCOSE, CAPILLARY
GLUCOSE-CAPILLARY: 118 mg/dL — AB (ref 65–99)
GLUCOSE-CAPILLARY: 89 mg/dL (ref 65–99)
GLUCOSE-CAPILLARY: 73 mg/dL (ref 65–99)
Glucose-Capillary: 166 mg/dL — ABNORMAL HIGH (ref 65–99)

## 2016-04-14 LAB — CBC WITH DIFFERENTIAL/PLATELET
Basophils Absolute: 0 10*3/uL (ref 0–0.1)
Basophils Relative: 0 %
EOS ABS: 0 10*3/uL (ref 0–0.7)
EOS PCT: 0 %
HCT: 29.1 % — ABNORMAL LOW (ref 40.0–52.0)
HEMOGLOBIN: 9.6 g/dL — AB (ref 13.0–18.0)
LYMPHS ABS: 0.4 10*3/uL — AB (ref 1.0–3.6)
LYMPHS PCT: 3 %
MCH: 26.8 pg (ref 26.0–34.0)
MCHC: 33.1 g/dL (ref 32.0–36.0)
MCV: 80.7 fL (ref 80.0–100.0)
MONOS PCT: 9 %
Monocytes Absolute: 1.1 10*3/uL — ABNORMAL HIGH (ref 0.2–1.0)
NEUTROS PCT: 88 %
Neutro Abs: 10.3 10*3/uL — ABNORMAL HIGH (ref 1.4–6.5)
Platelets: 128 10*3/uL — ABNORMAL LOW (ref 150–440)
RBC: 3.6 MIL/uL — ABNORMAL LOW (ref 4.40–5.90)
RDW: 17 % — ABNORMAL HIGH (ref 11.5–14.5)
WBC: 11.8 10*3/uL — ABNORMAL HIGH (ref 3.8–10.6)

## 2016-04-14 MED ORDER — OXYCODONE-ACETAMINOPHEN 5-325 MG PO TABS: 1.0000 | ORAL_TABLET | ORAL | Status: DC | PRN

## 2016-04-14 NOTE — NC FL2 (Signed)
[place] MEDICAID FL2 LEVEL OF CARE SCREENING TOOL     IDENTIFICATION  Patient Name: [name] Birthdate: 1927-09-22 Sex: male Admission Date (Current Location): 04/12/2016  [place] and [place] Number:  Chiropodist and Address:  [facility], [address], [place], [place] [address]      Provider Number: [id number]  Attending Physician Name and Address:  Ramonita Lab, MD  Relative Name and Phone Number:       Current Level of Care: Hospital Recommended Level of Care: Skilled Nursing Facility Prior Approval Number:    Date Approved/Denied:   PASRR Number: [id number]  Discharge Plan: SNF    Current Diagnoses: Patient Active Problem List   Diagnosis Date Noted  . Elevated troponin   . Severe sepsis (HCC) 04/12/2016  . Lactic acidosis 04/12/2016  . Acute renal failure (HCC) 04/12/2016  . Ureteral stone with hydronephrosis 04/12/2016  . Leukocytosis 04/12/2016  . Acute pyelonephritis 04/12/2016  . Atelectasis of left lung 04/12/2016  . Nephrolithiasis 12/02/2015  . Chronic progressive renal failure, stage 3 (moderate) 12/02/2015  . Urinary retention 12/02/2015  . Chronic gouty arthritis 09/23/2015  . Morbid obesity (HCC) 04/15/2015  . GI bleed 02/21/2015  . Peripheral vascular disease (HCC) 10/24/2014  . Frank hematuria 09/30/2014  . Bradycardia 06/17/2014  . Paroxysmal atrial fibrillation (HCC) 06/07/2014  . Arteriosclerosis of coronary artery 01/26/2014  . Diabetes mellitus (HCC) 01/26/2014  . Essential hypertension 12/19/2013  . Cardiomyopathy (HCC) 11/21/2013  . Hypercholesterolemia 11/21/2013  . Obstructive apnea 11/21/2013  . Coronary artery disease 07/27/2013  . Status post coronary angioplasty 07/27/2013  . Diabetes mellitus type 2 with complications (HCC) 07/27/2013  . Atrial fibrillation (HCC) 07/27/2013  . Obesity 07/27/2013  . Hyperlipidemia 07/27/2013  . Chronic diastolic CHF (congestive heart failure)  (HCC) 07/27/2013  . Lower GI bleed 07/27/2013  . Elevated prostate specific antigen (PSA) 08/07/2012  . Benign prostatic hyperplasia with urinary obstruction 08/07/2012  . ED (erectile dysfunction) of organic origin 08/07/2012    Orientation RESPIRATION BLADDER Height & Weight     Self, Place  Normal Continent Weight: 170 lb 6.4 oz (77.3 kg) Height:  5\' 5"  (165.1 cm)  BEHAVIORAL SYMPTOMS/MOOD NEUROLOGICAL BOWEL NUTRITION STATUS   (none)  (none) Continent Diet (carb modified)  AMBULATORY STATUS COMMUNICATION OF NEEDS Skin   Extensive Assist Verbally Normal                       Personal Care Assistance Level of Assistance  Bathing, Dressing Bathing Assistance: Limited assistance   Dressing Assistance: Limited assistance     Functional Limitations Info  Speech          SPECIAL CARE FACTORS FREQUENCY  PT (By licensed PT)                    Contractures Contractures Info: Not present    Additional Factors Info  Code Status, Allergies Code Status Info: full Allergies Info: exforge,lisinopril,niacin           Current Medications (04/14/2016):  This is the current hospital active medication list Current Facility-Administered Medications  Medication Dose Route Frequency Provider Last Rate Last Dose  . 0.9 %  sodium chloride infusion   Intravenous Continuous Katharina Caper, MD 120 mL/hr at 04/14/16 0747    . acetaminophen (TYLENOL) tablet 650 mg  650 mg Oral Q6H PRN Katharina Caper, MD   650 mg at 04/13/16 1642   Or  . acetaminophen (TYLENOL) suppository 650  mg  650 mg Rectal Q6H PRN Katharina Caperima Vaickute, MD      . docusate sodium (COLACE) capsule 100 mg  100 mg Oral BID Katharina Caperima Vaickute, MD   100 mg at 04/14/16 1034  . enoxaparin (LOVENOX) injection 30 mg  30 mg Subcutaneous Q24H Katharina Caperima Vaickute, MD   30 mg at 04/12/16 2152  . finasteride (PROSCAR) tablet 5 mg  5 mg Oral Daily Katharina Caperima Vaickute, MD   5 mg at 04/14/16 1034  . insulin aspart (novoLOG) injection 0-5 Units  0-5  Units Subcutaneous QHS Katharina Caperima Vaickute, MD   3 Units at 04/13/16 0753  . insulin aspart (novoLOG) injection 0-9 Units  0-9 Units Subcutaneous TID WC Katharina Caperima Vaickute, MD   3 Units at 04/13/16 1642  . insulin aspart (novoLOG) injection 3 Units  3 Units Subcutaneous TID WC Katharina Caperima Vaickute, MD   3 Units at 04/14/16 0840  . insulin glargine (LANTUS) injection 12 Units  12 Units Subcutaneous Daily Katharina Caperima Vaickute, MD   12 Units at 04/14/16 1000  . ondansetron (ZOFRAN) tablet 4 mg  4 mg Oral Q6H PRN Katharina Caperima Vaickute, MD       Or  . ondansetron (ZOFRAN) injection 4 mg  4 mg Intravenous Q6H PRN Katharina Caperima Vaickute, MD      . oxyCODONE-acetaminophen (PERCOCET/ROXICET) 5-325 MG per tablet 1 tablet  1 tablet Oral Q4H PRN Katharina Caperima Vaickute, MD   1 tablet at 04/13/16 0107  . piperacillin-tazobactam (ZOSYN) IVPB 3.375 g  3.375 g Intravenous Q12H Cindi CarbonMary M Swayne, RPH   3.375 g at 04/14/16 1034  . sodium chloride flush (NS) 0.9 % injection 3 mL  3 mL Intravenous Q12H Katharina Caperima Vaickute, MD   Stopped at 04/14/16 1000  . tamsulosin (FLOMAX) capsule 0.4 mg  0.4 mg Oral Daily Katharina Caperima Vaickute, MD   0.4 mg at 04/14/16 1034     Discharge Medications: Please see discharge summary for a list of discharge medications.  Relevant Imaging Results:  Relevant Lab Results:   Additional Information SS: [id number]  York SpanielMonica Marra, LCSW

## 2016-04-14 NOTE — Progress Notes (Signed)
[facility] - [place] at [facility]   PATIENT NAME: [name]    MR#:  [id number]  DATE OF BIRTH:  [birth year]  SUBJECTIVE:  CHIEF COMPLAINT:  Patient is out of bed to chair. Mentation is better today daughter at bedside  REVIEW OF SYSTEMS:  CONSTITUTIONAL: No fever, fatigue . Reporting weakness.  EYES: No blurred or double vision.  EARS, NOSE, AND THROAT: No tinnitus or ear pain.  RESPIRATORY: No cough, shortness of breath, wheezing or hemoptysis.  CARDIOVASCULAR: No chest pain, orthopnea, edema.  GASTROINTESTINAL: No nausea, vomiting, diarrhea or abdominal pain.  GENITOURINARY: No dysuria, hematuria.  ENDOCRINE: No polyuria, nocturia,  HEMATOLOGY: No anemia, easy bruising or bleeding SKIN: No rash or lesion. MUSCULOSKELETAL: Some left flank pain No joint pain or arthritis.   NEUROLOGIC: No tingling, numbness, weakness.  PSYCHIATRY: No anxiety or depression.   DRUG ALLERGIES:   Allergies  Allergen Reactions  . Exforge [Amlodipine Besylate-Valsartan] Swelling and Other (See Comments)    Pt states that he is unable to swallow.    . Lisinopril Swelling and Other (See Comments)    Pt states that he is unable to swallow.    . Niacin And Related Swelling and Other (See Comments)    Pt states that he is unable to swallow.      VITALS:  Blood pressure (!) 141/75, pulse 93, temperature 98.9 F (37.2 C), temperature source Oral, resp. rate 17, height 5\' 5"  (1.651 m), weight 77.3 kg (170 lb 6.4 oz), SpO2 93 %.  PHYSICAL EXAMINATION:  GENERAL:  82 y.o.-year-old patient lying in the bed with no acute distress.  EYES: Pupils equal, round, reactive to light and accommodation. No scleral icterus. Extraocular muscles intact.  HEENT: Head atraumatic, normocephalic. Oropharynx and nasopharynx clear.  NECK:  Supple, no jugular venous distention. No thyroid enlargement, no tenderness.  LUNGS: Normal breath sounds bilaterally, no wheezing, rales,rhonchi or  crepitation. No use of accessory muscles of respiration.  CARDIOVASCULAR: S1, S2 normal. No murmurs, rubs, or gallops.  ABDOMEN: Soft, nontender, nondistended.Left flank tenderness improved Bowel sounds present. No organomegaly or mass. Yellow urine in the drain EXTREMITIES: No pedal edema, cyanosis, or clubbing.  NEUROLOGIC: Cranial nerves II through XII are intact. Muscle strength at his baseline Sensation intact. Gait not checked.  PSYCHIATRIC: The patient is alert and oriented x 2-3 SKIN: No obvious rash, lesion, or ulcer.    LABORATORY PANEL:   CBC  Recent Labs Lab 04/14/16 0517  WBC 11.8*  HGB 9.6*  HCT 29.1*  PLT 128*   ------------------------------------------------------------------------------------------------------------------  Chemistries   Recent Labs Lab 04/12/16 1123  04/14/16 0517  NA 131*  < > 131*  K 4.1  < > 3.6  CL 99*  < > 104  CO2 18*  < > 19*  GLUCOSE 209*  < > 119*  BUN 42*  < > 50*  CREATININE 3.01*  < > 3.04*  CALCIUM 8.6*  < > 7.4*  AST 26  --   --   ALT 12*  --   --   ALKPHOS 74  --   --   BILITOT 1.4*  --   --   < > = values in this interval not displayed. ------------------------------------------------------------------------------------------------------------------  Cardiac Enzymes  Recent Labs Lab 04/13/16 1010  TROPONINI 0.57*   ------------------------------------------------------------------------------------------------------------------  RADIOLOGY:  [place] Intraoperative  Result Date: 04/12/2016 CLINICAL DATA:  Ultrasound was provided for use by the ordering physician, and a technical charge was applied by the performing facility.  No radiologist interpretation/professional services rendered.   Ir Nephrostomy Placement Left  Result Date: 04/13/2016 INDICATION: Obstructing left-sided ureteral stone with clinical symptoms worrisome for impending urosepsis. Request made for placement of an ultrasound fluoroscopic guided  left-sided percutaneous nephrostomy catheter. EXAM: 1. ULTRASOUND GUIDANCE FOR PUNCTURE OF THE LEFT RENAL COLLECTING SYSTEM 2. LEFT PERCUTANEOUS NEPHROSTOMY TUBE PLACEMENT. COMPARISON:  CT the abdomen pelvis - 04/12/2016 MEDICATIONS: The patient is currently admitted to the hospital and receiving intravenous antibiotics; The antibiotic was administered in an appropriate time frame prior to skin puncture. ANESTHESIA/SEDATION: Moderate (conscious) sedation was employed during this procedure. A total of Fentanyl 75 mcg was administered intravenously. Moderate Sedation Time: 20 minutes. The patient's level of consciousness and vital signs were monitored continuously by radiology nursing throughout the procedure under my direct supervision. CONTRAST:  15 mL Isovue 300 administered into the collecting system FLUOROSCOPY TIME:  1 minute (40 mGy) COMPLICATIONS: None immediate. PROCEDURE: The procedure, risks, benefits, and alternatives were explained to the patient. Questions regarding the procedure were encouraged and answered. The patient understands and consents to the procedure. A timeout was performed prior to the initiation of the procedure. The left flank region was prepped with Betadine in a sterile fashion, and a sterile drape was applied covering the operative field. A sterile gown and sterile gloves were used for the procedure. Local anesthesia was provided with 1% Lidocaine with epinephrine. Ultrasound was used to localize the left kidney. Under direct ultrasound guidance, a 21 gauge needle was advanced into the renal collecting system. An ultrasound image documentation was performed. Access within the collecting system was confirmed with the efflux of urine followed by contrast injection. Over an 0.018 wire, the tract was dilated with an Accustick stent. Over a guide wire, a 10-French percutaneous nephrostomy catheter was advanced into the collecting system where the coil was formed and locked. Contrast was  injected and several sport radiographs were obtained in various obliquities confirming access. The catheter was secured at the skin with a Prolene retention suture and a gravity bag was placed. A dressing was placed. The patient tolerated procedure well without immediate postprocedural complication. FINDINGS: Ultrasound scanning demonstrates a mild to moderately dilated left renal collecting system. Under direct ultrasound guidance, a posterior inferior calix was targeted allowing advancement of an 10-French percutaneous nephrostomy catheter under intermittent fluoroscopic guidance. Following percutaneous nephrostomy catheter, purulent appearing, foul smelling urine was aspirated from the collecting system. A aspirated urine sample was capped and sent to the laboratory for analysis. Contrast injection confirmed appropriate positioning. IMPRESSION: Successful ultrasound and fluoroscopic guided placement of a left sided 10 French PCN yielding purulent urine. A sample of aspirated urine was capped and sent to the laboratory for analysis. Electronically Signed   By: Simonne ComeJohn  Watts M.D.   On: 04/13/2016 08:17    EKG:   Orders placed or performed during the hospital encounter of 04/12/16  . EKG 12-Lead  . EKG 12-Lead  . EKG 12-Lead  . EKG 12-Lead    ASSESSMENT AND PLAN:    #1. Sepsis due to acute pyelonephritis/obstructive stone-leading to metabolic encephalopathy Clinically improving Status post left-sided stent placement with a drain ,Urology is recommending outpatient follow-up after discharge continue IV fluids,  Continue broad-spectrum with Zosyn antibiotic therap follow-up with infectious disease Follow-up aerobic/anaerobic cultures performed by urology showing Klebsiella sensitive to Zosyn, Cipro and Rocephin, blood cultures with klebsiella. We will change the antibiotic to Rocephin after discussing with ID   #2. Lactic acidosis,  follow lactic acid level 4.0-1.2  Continue hydration,  antibiotic therapy  #3. Acute renal failure due to infection and obstructive stone, with hydronephrosis Status post left-sided nephrostomy placement 10/16 2017 by interventional radiology urology's recommendation to continue IV fluids and antibiotics and outpatient follow-up  continue IV fluids, follow creatinine Appreciate nephrology recommendations might consider temporary dialysis if no improvement. Family is agreeable baseline GFR 38 currently at 12   #4. Left lung atelectasis Incentive spirometry   on broad-spectrum IV antibiotics Sputum cultures are pending  PT consult  All the records are reviewed and case discussed with Care Management/Social Workerr. Management plans discussed with the patient, daughter and grandson at bedside  and they are in agreement. daughter with several questions on questions were answered to her satisfaction  CODE STATUS: fc  TOTAL TIME TAKING CARE OF THIS PATIENT: 45 minutes.   POSSIBLE D/C IN 2-3 DAYS, DEPENDING ON CLINICAL CONDITION.  Note: This dictation was prepared with Dragon dictation along with smaller phrase technology. Any transcriptional errors that result from this process are unintentional.   Ramonita Lab M.D on 04/14/2016 at 4:00 PM  Between 7am to 6pm - Pager - [phone] After 6pm go to [email or web address] - password [email or web address] [facility]  [place] [facility]  Office  [phone]  CC: Primary care physician; Lauro Regulus., MD

## 2016-04-14 NOTE — Clinical Social Work Note (Signed)
Clinical Social Work Assessment  Patient Details  Name: [name] MRN: [id number] Date of Birth: 1927/12/29  Date of referral:  04/14/16               Reason for consult:  Facility Placement                Permission sought to share information with:  Family Supports Permission granted to share information::  Yes, Verbal Permission Granted  Name::        Agency::     Relationship::     Contact Information:     Housing/Transportation Living arrangements for the past 2 months:  Single Family Home Source of Information:  Spouse Patient Interpreter Needed:  None Criminal Activity/Legal Involvement Pertinent to Current Situation/Hospitalization:  No - Comment as needed Significant Relationships:  Adult Children, Spouse Lives with:  Spouse Do you feel safe going back to the place where you live?  Yes Need for family participation in patient care:  Yes (Comment)  Care giving concerns:  Patient and husband reside together.   Social Worker assessment / plan:  CSW spoke with patient's wife via phone this morning and again in person this afternoon in patient's hospital room. Patient did not speak or engage in the conversation. Patient's wife states that PT assessed her husband when he was the most sick and that she believes he is much improved and wishes for PT to come back and re-eval her husband. Patient's wife stated via phone this morning that she did not believe her husband would agree to go to rehab. She has asked me to wait on bedsearch at this time.   Employment status:    Insurance information:    PT Recommendations:  Skilled Nursing Facility Information / Referral to community resources:     Patient/Family's Response to care:  Patient's wife expressed appreciation for CSW assistance.  Patient/Family's Understanding of and Emotional Response to Diagnosis, Current Treatment, and Prognosis:  Patient did not engage in conversation and patient's wife is not fully aware of  patient's limitations at this time.  Emotional Assessment Appearance:  Appears stated age Attitude/Demeanor/Rapport:  Unable to Assess Affect (typically observed):  Quiet Orientation:   (unable to assess as he did not speak during CSW conversation with wife in his hospital room) Alcohol / Substance use:  Not Applicable Psych involvement (Current and /or in the community):  No (Comment)  Discharge Needs  Concerns to be addressed:  Care Coordination Readmission within the last 30 days:  No Current discharge risk:  None Barriers to Discharge:  No Barriers Identified   York Spaniel, LCSW 04/14/2016, 3:32 PM

## 2016-04-14 NOTE — Progress Notes (Signed)
Patient: [name] / Admit Date: 04/12/2016 / Date of Encounter: 04/14/2016, 11:38 AM   Subjective: No acute cardiac events overnight. Growing Enterobacter and Klebsiella in the blood. Remains with intermittent fever. Renal function 3.04 this morning (3.06 on 04/14/16). HGB 10.8-->9.6.    Review of Systems: Review of Systems  Constitutional: Positive for chills, fever and malaise/fatigue. Negative for diaphoresis and weight loss.  HENT: Negative for congestion.   Eyes: Negative for discharge and redness.  Respiratory: Negative for cough, hemoptysis, sputum production, shortness of breath and wheezing.   Cardiovascular: Negative for chest pain, palpitations, orthopnea, claudication, leg swelling and PND.  Gastrointestinal: Negative for abdominal pain, blood in stool, heartburn, melena, nausea and vomiting.  Musculoskeletal: Positive for myalgias. Negative for falls.  Skin: Negative for rash.  Neurological: Positive for weakness. Negative for dizziness, tingling, tremors, sensory change, speech change, focal weakness and loss of consciousness.  Endo/Heme/Allergies: Does not bruise/bleed easily.  Psychiatric/Behavioral: Negative for substance abuse. The patient is not nervous/anxious.   All other systems reviewed and are negative.   Objective: Telemetry: NSR with blocked PACs, 90's bpm Physical Exam: Blood pressure 123/67, pulse 93, temperature 99.6 F (37.6 C), temperature source Oral, resp. rate 18, height 5\' 5"  (1.651 m), weight 170 lb 6.4 oz (77.3 kg), SpO2 94 %. Body mass index is 28.36 kg/m. General: Well developed, well nourished, in no acute distress. Head: Normocephalic, atraumatic, sclera non-icteric, no xanthomas, nares are without discharge. Neck: Negative for carotid bruits. JVP not elevated. Lungs: Clear bilaterally to auscultation without wheezes, rales, or rhonchi. Breathing is unlabored. Heart: RRR S1 S2 without murmurs, rubs, or gallops.  Abdomen: Soft,  non-tender, non-distended with normoactive bowel sounds. No rebound/guarding. Extremities: No clubbing or cyanosis. Trace peripheral edema. Distal pedal pulses are 2+ and equal bilaterally. Neuro: Alert and oriented X 3. Moves all extremities spontaneously. Psych:  Responds to questions appropriately with a normal affect.   Intake/Output Summary (Last 24 hours) at 04/14/16 1138 Last data filed at 04/14/16 1030  Gross per 24 hour  Intake             2282 ml  Output             1976 ml  Net              306 ml    Inpatient Medications:  . docusate sodium  100 mg Oral BID  . enoxaparin (LOVENOX) injection  30 mg Subcutaneous Q24H  . finasteride  5 mg Oral Daily  . insulin aspart  0-5 Units Subcutaneous QHS  . insulin aspart  0-9 Units Subcutaneous TID WC  . insulin aspart  3 Units Subcutaneous TID WC  . insulin glargine  12 Units Subcutaneous Daily  . piperacillin-tazobactam (ZOSYN)  IV  3.375 g Intravenous Q12H  . sodium chloride flush  3 mL Intravenous Q12H  . tamsulosin  0.4 mg Oral Daily   Infusions:  . sodium chloride 120 mL/hr at 04/14/16 0747    Labs:  Recent Labs  04/13/16 0340 04/14/16 0517  NA 132* 131*  K 4.2 3.6  CL 101 104  CO2 21* 19*  GLUCOSE 146* 119*  BUN 45* 50*  CREATININE 3.06* 3.04*  CALCIUM 7.8* 7.4*    Recent Labs  04/12/16 1123  AST 26  ALT 12*  ALKPHOS 74  BILITOT 1.4*  PROT 7.9  ALBUMIN 3.3*    Recent Labs  04/12/16 1123  04/13/16 0340 04/14/16 0517  WBC 14.9*  < > 11.8* 11.8*  NEUTROABS 12.9*  --   --  10.3*  HGB 11.0*  < > 10.8* 9.6*  HCT 31.6*  < > 31.6* 29.1*  MCV 85.1  < > 82.1 80.7  PLT 175  < > 129* 128*  < > = values in this interval not displayed.  Recent Labs  04/12/16 1748 04/12/16 2255 04/13/16 0340 04/13/16 1010  TROPONINI 0.10* 0.40* 0.86* 0.57*   Invalid input(s): POCBNP  Recent Labs  04/12/16 1748  HGBA1C 7.3*     Weights: Filed Weights   04/12/16 2000 04/14/16 0446  Weight: 179 lb 7.3 oz  (81.4 kg) 170 lb 6.4 oz (77.3 kg)     Radiology/Studies:  Ct Abdomen Pelvis Wo Contrast  Result Date: 04/12/2016 CLINICAL DATA:  confusion and weakness since 6am. Family reports patient complaining of abdominal pain yesterday and taking pain medication and becoming sleepier than usual. Patient feels hot to the touch. Patient is extremely weak and confused. This RN had difficulty getting patient from the vehicle. Hx of cholecystectomy and inguinal hernia repair EXAM: CT ABDOMEN AND PELVIS WITHOUT CONTRAST TECHNIQUE: Multidetector CT imaging of the abdomen and pelvis was performed following the standard protocol without IV contrast. COMPARISON:  04/30/2009 FINDINGS: Lower chest: Patchy subsegmental atelectasis or scarring posteriorly in the visualized lung bases left greater than right. Coronary calcifications. Four-chamber cardiac enlargement. Blood pool less dense than the interventricular septum suggesting anemia. Hepatobiliary: No focal liver abnormality is seen. Status post cholecystectomy. No biliary dilatation. Pancreas: Mild diffuse atrophy without mass or ductal dilatation. Spleen: Normal in size without focal abnormality. Adrenals/Urinary Tract: Stable 15 mm left adrenal nodule. Moderate left hydronephrosis and marked ureterectasis down to the level of a 15 mm mid ureteral calculus. Foley catheter decompresses the urinary bladder. 8 mm calculus in the lumen of the urinary bladder. Right kidney and adrenal gland unremarkable. Stomach/Bowel: Stomach, small bowel, and colon are nondilated. Scattered descending and sigmoid diverticula. Vascular/Lymphatic: Moderate aortoiliac arterial calcifications. Right common iliac artery dilated 2.2 cm diameter, left also 2.2 cm. Reproductive: Prostatic enlargement.  Foley catheter in place. Other: No ascites.  No free air. Musculoskeletal: Degenerative disc disease L4-5 and L5-S1. Negative for fracture or dislocation. IMPRESSION: 1. Obstructing 15 mm left ureteral  calculus. Moderate left hydronephrosis and proximal ureterectasis. 2. 8 mm urinary bladder calculus. 3. Descending and sigmoid diverticulosis. 4. Cardiomegaly with coronary calcifications. 5. 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 Electronically Signed   By: Corlis Leak M.D.   On: 04/12/2016 13:37   [place] Intraoperative  Result Date: 04/12/2016 CLINICAL DATA:  Ultrasound was provided for use by the ordering physician, and a technical charge was applied by the performing facility.  No radiologist interpretation/professional services rendered.   Dg Chest Port 1 View  Result Date: 04/12/2016 CLINICAL DATA:  Confusion and weakness since 6 a.m. EXAM: PORTABLE CHEST 1 VIEW COMPARISON:  11/22/2015 FINDINGS: Subtle patchy densities in the left lower lobe appear to be new. Remainder of the lungs are clear. Stable enlargement of the cardiac silhouette. Patient is rotated towards the right which is similar to the prior examination. No acute bone abnormality. IMPRESSION: Subtle densities at the left lung base. Although these findings could be related atelectasis, subtle infection cannot be excluded in this area. Stable cardiomegaly. Electronically Signed   By: Richarda Overlie M.D.   On: 04/12/2016 11:45   Ir Nephrostomy Placement Left  Result Date: 04/13/2016 INDICATION: Obstructing left-sided ureteral stone with clinical symptoms worrisome for impending urosepsis. Request made for  placement of an ultrasound fluoroscopic guided left-sided percutaneous nephrostomy catheter. EXAM: 1. ULTRASOUND GUIDANCE FOR PUNCTURE OF THE LEFT RENAL COLLECTING SYSTEM 2. LEFT PERCUTANEOUS NEPHROSTOMY TUBE PLACEMENT. COMPARISON:  CT the abdomen pelvis - 04/12/2016 MEDICATIONS: The patient is currently admitted to the hospital and receiving intravenous antibiotics; The antibiotic was administered in an appropriate time frame prior to skin puncture. ANESTHESIA/SEDATION: Moderate (conscious)  sedation was employed during this procedure. A total of Fentanyl 75 mcg was administered intravenously. Moderate Sedation Time: 20 minutes. The patient's level of consciousness and vital signs were monitored continuously by radiology nursing throughout the procedure under my direct supervision. CONTRAST:  15 mL Isovue 300 administered into the collecting system FLUOROSCOPY TIME:  1 minute (40 mGy) COMPLICATIONS: None immediate. PROCEDURE: The procedure, risks, benefits, and alternatives were explained to the patient. Questions regarding the procedure were encouraged and answered. The patient understands and consents to the procedure. A timeout was performed prior to the initiation of the procedure. The left flank region was prepped with Betadine in a sterile fashion, and a sterile drape was applied covering the operative field. A sterile gown and sterile gloves were used for the procedure. Local anesthesia was provided with 1% Lidocaine with epinephrine. Ultrasound was used to localize the left kidney. Under direct ultrasound guidance, a 21 gauge needle was advanced into the renal collecting system. An ultrasound image documentation was performed. Access within the collecting system was confirmed with the efflux of urine followed by contrast injection. Over an 0.018 wire, the tract was dilated with an Accustick stent. Over a guide wire, a 10-French percutaneous nephrostomy catheter was advanced into the collecting system where the coil was formed and locked. Contrast was injected and several sport radiographs were obtained in various obliquities confirming access. The catheter was secured at the skin with a Prolene retention suture and a gravity bag was placed. A dressing was placed. The patient tolerated procedure well without immediate postprocedural complication. FINDINGS: Ultrasound scanning demonstrates a mild to moderately dilated left renal collecting system. Under direct ultrasound guidance, a posterior  inferior calix was targeted allowing advancement of an 10-French percutaneous nephrostomy catheter under intermittent fluoroscopic guidance. Following percutaneous nephrostomy catheter, purulent appearing, foul smelling urine was aspirated from the collecting system. A aspirated urine sample was capped and sent to the laboratory for analysis. Contrast injection confirmed appropriate positioning. IMPRESSION: Successful ultrasound and fluoroscopic guided placement of a left sided 10 French PCN yielding purulent urine. A sample of aspirated urine was capped and sent to the laboratory for analysis. Electronically Signed   By: [name] M.D.   On: 04/13/2016 08:17     Assessment and Plan  Active Problems:   Severe sepsis (HCC)   Lactic acidosis   Acute renal failure (HCC)   Ureteral stone with hydronephrosis   Leukocytosis   Acute pyelonephritis   Atelectasis of left lung   Elevated troponin    1. Elevated troponin: Never with any chest pain or SOB. No changes from his baseline sedentary lifestyle. 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 Trend troponin to peak. Echo showed LV systolic function at 55-60%. Could plan for outpatient nuclear stress testing, if indicated. Given his comorbidities, may be best severed with conservative treatment.   2. Sepsis with bacteremia 2/2 obstructing urinary stone: Growing Enterobacter and Klebsiella in the blood. ABX per IM. Status post nephrostomy tube. Pain improving. Per IM.   3.  Acute on CKD stage II-III: IV fluids. Stable. Avoid nephrotoxic agents. Would be judicious with IV fluids given his sepsis and renal dysfunction.   4. CAD as above: No symptoms concerning for angina. Continue medial treatment. Not on beta blocker as an outpatient given prior bradycardia. Not on ASA given multiple issues with GIB. Losartan on hold 2/2 hypotension.   5.  PAF/atrial ectopy: Currently in sinus rhythm. Ectopy likely in the setting of his infection. Not on BB as above. Not on long term, full-dose anticoagulation 2/2 multiple prior issues with GIB. CHADS2VASc at least 6 (CHF, HTN, age x 2, DM, vascular disease). He is aware of stroke risk.   6. Chronic combined CHF/dilated cardiomyopathy: SOB stable. Not on BB as above. Losartan held 2/2 hypotension. Torsemide and metolazone held given hypotension and acute on CKD as above. Restart when able. CHF education.   7. HTN: BP soft currently. IV fluids.   8. HLD: Statin intolerant.   9. OSA: CPAP usage advised.   Signed, Eula Listen, PA-C [facility] Pager: [phone] 04/14/2016, 11:38 AM

## 2016-04-14 NOTE — Progress Notes (Signed)
[facility]  ROUNDING NOTE   Subjective:  Renal function about the same at the moment. Creatinine currently 3.04. Wife also at bedside.    Objective:  Vital signs in last 24 hours:  Temp:  [98 F (36.7 C)-101.5 F (38.6 C)] 99.6 F (37.6 C) (10/18 0549) Pulse Rate:  [62-100] 93 (10/18 0549) Resp:  [17-27] 18 (10/18 0549) BP: (96-140)/(41-110) 123/67 (10/18 0549) SpO2:  [94 %-100 %] 94 % (10/18 0549) Weight:  [77.3 kg (170 lb 6.4 oz)] 77.3 kg (170 lb 6.4 oz) (10/18 0446)  Weight change: -4.107 kg (-9 lb 0.9 oz) Filed Weights   04/12/16 2000 04/14/16 0446  Weight: 81.4 kg (179 lb 7.3 oz) 77.3 kg (170 lb 6.4 oz)    Intake/Output: I/O last 3 completed shifts: In: 2212 [P.O.:301; I.V.:1822; Other:10; IV Piggyback:79] Out: 2725 [Urine:2625; Other:100]   Intake/Output this shift:  Total I/O In: 240 [P.O.:240] Out: -   Physical Exam: General: No acute distress, sitting up in bed  Head: Normocephalic, atraumatic. Moist oral mucosal membranes  Eyes: Anicteric  Neck: Supple, trachea midline  Lungs:  Clear to auscultation, normal effort  Heart: S1S2 no rubs  Abdomen:  Soft, nontender, bowel sounds present   Extremities: trace peripheral edema.  Neurologic: Awake, alert, follows commands  Skin: No lesions       Basic Metabolic Panel:  Recent Labs Lab 04/12/16 1123 04/12/16 1748 04/13/16 0340 04/14/16 0517  NA 131*  --  132* 131*  K 4.1  --  4.2 3.6  CL 99*  --  101 104  CO2 18*  --  21* 19*  GLUCOSE 209*  --  146* 119*  BUN 42*  --  45* 50*  CREATININE 3.01* 2.94* 3.06* 3.04*  CALCIUM 8.6*  --  7.8* 7.4*    Liver Function Tests:  Recent Labs Lab 04/12/16 1123  AST 26  ALT 12*  ALKPHOS 74  BILITOT 1.4*  PROT 7.9  ALBUMIN 3.3*    Recent Labs Lab 04/12/16 1123  LIPASE 19   No results for input(s): AMMONIA in the last 168 hours.  CBC:  Recent Labs Lab 04/12/16 1123 04/12/16 1748 04/13/16 0340 04/14/16 0517  WBC 14.9* 14.5*  11.8* 11.8*  NEUTROABS 12.9*  --   --  10.3*  HGB 11.0* 10.2* 10.8* 9.6*  HCT 31.6* 30.6* 31.6* 29.1*  MCV 85.1 80.8 82.1 80.7  PLT 175 150 129* 128*    Cardiac Enzymes:  Recent Labs Lab 04/12/16 1123 04/12/16 1748 04/12/16 2255 04/13/16 0340 04/13/16 1010  TROPONINI 0.07* 0.10* 0.40* 0.86* 0.57*    BNP: Invalid input(s): POCBNP  CBG:  Recent Labs Lab 04/13/16 0714 04/13/16 1135 04/13/16 1633 04/13/16 2107 04/14/16 0724  GLUCAP 140* 158* 129* 74 118*    Microbiology: Results for orders placed or performed during the hospital encounter of 04/12/16  Blood Culture (routine x 2)     Status: None (Preliminary result)   Collection Time: 04/12/16 11:23 AM  Result Value Ref Range Status   Specimen Description BLOOD RIGHT AC  Final   Special Requests   Final    BOTTLES DRAWN AEROBIC AND ANAEROBIC AER 4ML ANA 2ML   Culture  Setup Time   Final    GRAM NEGATIVE RODS AEROBIC BOTTLE ONLY CRITICAL VALUE NOTED.  VALUE IS CONSISTENT WITH PREVIOUSLY REPORTED AND CALLED VALUE.    Culture GRAM NEGATIVE RODS  Final   Report Status PENDING  Incomplete  Blood Culture (routine x 2)     Status:  None (Preliminary result)   Collection Time: 04/12/16 11:23 AM  Result Value Ref Range Status   Specimen Description BLOOD LEFT AC  Final   Special Requests   Final    BOTTLES DRAWN AEROBIC AND ANAEROBIC AER 5ML ANA 3ML   Culture  Setup Time   Final    Organism ID to follow GRAM NEGATIVE RODS ANAEROBIC BOTTLE ONLY CRITICAL RESULT CALLED TO, READ BACK BY AND VERIFIED WITH: MATT MCBANE AT [place] ON 04/13/16 RWW CONFIRMED BY TLB    Culture NO GROWTH 2 DAYS  Final   Report Status PENDING  Incomplete  Urine culture     Status: Abnormal   Collection Time: 04/12/16 11:23 AM  Result Value Ref Range Status   Specimen Description URINE, RANDOM  Final   Special Requests NONE  Final   Culture >=100,000 COLONIES/mL KLEBSIELLA PNEUMONIAE (A)  Final   Report Status 04/14/2016 FINAL  Final    Organism ID, Bacteria KLEBSIELLA PNEUMONIAE (A)  Final      Susceptibility   Klebsiella pneumoniae - MIC*    AMPICILLIN >=32 RESISTANT Resistant     CEFAZOLIN <=4 SENSITIVE Sensitive     CEFTRIAXONE <=1 SENSITIVE Sensitive     CIPROFLOXACIN <=0.25 SENSITIVE Sensitive     GENTAMICIN <=1 SENSITIVE Sensitive     IMIPENEM <=0.25 SENSITIVE Sensitive     NITROFURANTOIN 64 INTERMEDIATE Intermediate     TRIMETH/SULFA <=20 SENSITIVE Sensitive     AMPICILLIN/SULBACTAM 4 SENSITIVE Sensitive     PIP/TAZO <=4 SENSITIVE Sensitive     Extended ESBL NEGATIVE Sensitive     * >=100,000 COLONIES/mL KLEBSIELLA PNEUMONIAE  Blood Culture ID Panel (Reflexed)     Status: Abnormal   Collection Time: 04/12/16 11:23 AM  Result Value Ref Range Status   Enterococcus species NOT DETECTED NOT DETECTED Final   Listeria monocytogenes NOT DETECTED NOT DETECTED Final   Staphylococcus species NOT DETECTED NOT DETECTED Final   Staphylococcus aureus NOT DETECTED NOT DETECTED Final   Streptococcus species NOT DETECTED NOT DETECTED Final   Streptococcus agalactiae NOT DETECTED NOT DETECTED Final   Streptococcus pneumoniae NOT DETECTED NOT DETECTED Final   Streptococcus pyogenes NOT DETECTED NOT DETECTED Final   Acinetobacter baumannii NOT DETECTED NOT DETECTED Final   Enterobacteriaceae species DETECTED (A) NOT DETECTED Final    Comment: CRITICAL RESULT CALLED TO, READ BACK BY AND VERIFIED WITH: MATT MCBANE AT 0327 ON 04/13/16 RWW    Enterobacter cloacae complex NOT DETECTED NOT DETECTED Final   Escherichia coli NOT DETECTED NOT DETECTED Final   Klebsiella oxytoca NOT DETECTED NOT DETECTED Final   Klebsiella pneumoniae DETECTED (A) NOT DETECTED Final    Comment: CRITICAL RESULT CALLED TO, READ BACK BY AND VERIFIED WITH: MATT MCBANE AT 0327 ON 04/13/16 RWW    Proteus species NOT DETECTED NOT DETECTED Final   Serratia marcescens NOT DETECTED NOT DETECTED Final   Carbapenem resistance NOT DETECTED NOT DETECTED Final    Haemophilus influenzae NOT DETECTED NOT DETECTED Final   Neisseria meningitidis NOT DETECTED NOT DETECTED Final   Pseudomonas aeruginosa NOT DETECTED NOT DETECTED Final   Candida albicans NOT DETECTED NOT DETECTED Final   Candida glabrata NOT DETECTED NOT DETECTED Final   Candida krusei NOT DETECTED NOT DETECTED Final   Candida parapsilosis NOT DETECTED NOT DETECTED Final   Candida tropicalis NOT DETECTED NOT DETECTED Final  Aerobic/Anaerobic Culture (surgical/deep wound)     Status: None (Preliminary result)   Collection Time: 04/12/16  4:56 PM  Result Value Ref Range Status  Specimen Description KIDNEY LEFT  Final   Special Requests Normal  Final   Gram Stain   Final    ABUNDANT WBC PRESENT,BOTH PMN AND MONONUCLEAR NO ORGANISMS SEEN Performed at [facility]    Culture   Final    ABUNDANT GRAM NEGATIVE RODS CRITICAL RESULT CALLED TO, READ BACK BY AND VERIFIED WITH: M. FLOWERS RN, AT 1114 04/13/16 BY D. VANHOOK NO ANAEROBES ISOLATED; CULTURE IN PROGRESS FOR 5 DAYS    Report Status PENDING  Incomplete  MRSA PCR Screening     Status: None   Collection Time: 04/12/16  6:19 PM  Result Value Ref Range Status   MRSA by PCR NEGATIVE NEGATIVE Final    Comment:        The GeneXpert MRSA Assay (FDA approved for NASAL specimens only), is one component of a comprehensive MRSA colonization surveillance program. It is not intended to diagnose MRSA infection nor to guide or monitor treatment for MRSA infections.     Coagulation Studies:  Recent Labs  04/12/16 1123  LABPROT 14.6  INR 1.13    Urinalysis:  Recent Labs  04/12/16 1123  COLORURINE RED*  LABSPEC 1.014  PHURINE 5.0  GLUCOSEU NEGATIVE  HGBUR 3+*  BILIRUBINUR NEGATIVE  KETONESUR NEGATIVE  PROTEINUR 100*  NITRITE NEGATIVE  LEUKOCYTESUR 3+*      Imaging: Ct Abdomen Pelvis Wo Contrast  Result Date: 04/12/2016 CLINICAL DATA:  confusion and weakness since 6am. Family reports patient complaining of  abdominal pain yesterday and taking pain medication and becoming sleepier than usual. Patient feels hot to the touch. Patient is extremely weak and confused. This RN had difficulty getting patient from the vehicle. Hx of cholecystectomy and inguinal hernia repair EXAM: CT ABDOMEN AND PELVIS WITHOUT CONTRAST TECHNIQUE: Multidetector CT imaging of the abdomen and pelvis was performed following the standard protocol without IV contrast. COMPARISON:  04/30/2009 FINDINGS: Lower chest: Patchy subsegmental atelectasis or scarring posteriorly in the visualized lung bases left greater than right. Coronary calcifications. Four-chamber cardiac enlargement. Blood pool less dense than the interventricular septum suggesting anemia. Hepatobiliary: No focal liver abnormality is seen. Status post cholecystectomy. No biliary dilatation. Pancreas: Mild diffuse atrophy without mass or ductal dilatation. Spleen: Normal in size without focal abnormality. Adrenals/Urinary Tract: Stable 15 mm left adrenal nodule. Moderate left hydronephrosis and marked ureterectasis down to the level of a 15 mm mid ureteral calculus. Foley catheter decompresses the urinary bladder. 8 mm calculus in the lumen of the urinary bladder. Right kidney and adrenal gland unremarkable. Stomach/Bowel: Stomach, small bowel, and colon are nondilated. Scattered descending and sigmoid diverticula. Vascular/Lymphatic: Moderate aortoiliac arterial calcifications. Right common iliac artery dilated 2.2 cm diameter, left also 2.2 cm. Reproductive: Prostatic enlargement.  Foley catheter in place. Other: No ascites.  No free air. Musculoskeletal: Degenerative disc disease L4-5 and L5-S1. Negative for fracture or dislocation. IMPRESSION: 1. Obstructing 15 mm left ureteral calculus. Moderate left hydronephrosis and proximal ureterectasis. 2. 8 mm urinary bladder calculus. 3. Descending and sigmoid diverticulosis. 4. Cardiomegaly with coronary calcifications. 5. Aortic  Atherosclerosis (ICD10-170.0) with bilateral 2.2 cm common iliac artery aneurysms. Electronically Signed   By: Lucrezia Europe M.D.   On: 04/12/2016 13:37   [place] Intraoperative  Result Date: 04/12/2016 CLINICAL DATA:  Ultrasound was provided for use by the ordering physician, and a technical charge was applied by the performing facility.  No radiologist interpretation/professional services rendered.   Dg Chest Port 1 View  Result Date: 04/12/2016 CLINICAL DATA:  Confusion and weakness since 6  a.m. EXAM: PORTABLE CHEST 1 VIEW COMPARISON:  11/22/2015 FINDINGS: Subtle patchy densities in the left lower lobe appear to be new. Remainder of the lungs are clear. Stable enlargement of the cardiac silhouette. Patient is rotated towards the right which is similar to the prior examination. No acute bone abnormality. IMPRESSION: Subtle densities at the left lung base. Although these findings could be related atelectasis, subtle infection cannot be excluded in this area. Stable cardiomegaly. Electronically Signed   By: Markus Daft M.D.   On: 04/12/2016 11:45   Ir Nephrostomy Placement Left  Result Date: 04/13/2016 INDICATION: Obstructing left-sided ureteral stone with clinical symptoms worrisome for impending urosepsis. Request made for placement of an ultrasound fluoroscopic guided left-sided percutaneous nephrostomy catheter. EXAM: 1. ULTRASOUND GUIDANCE FOR PUNCTURE OF THE LEFT RENAL COLLECTING SYSTEM 2. LEFT PERCUTANEOUS NEPHROSTOMY TUBE PLACEMENT. COMPARISON:  CT the abdomen pelvis - 04/12/2016 MEDICATIONS: The patient is currently admitted to the hospital and receiving intravenous antibiotics; The antibiotic was administered in an appropriate time frame prior to skin puncture. ANESTHESIA/SEDATION: Moderate (conscious) sedation was employed during this procedure. A total of Fentanyl 75 mcg was administered intravenously. Moderate Sedation Time: 20 minutes. The patient's level of consciousness and vital signs were  monitored continuously by radiology nursing throughout the procedure under my direct supervision. CONTRAST:  15 mL Isovue 300 administered into the collecting system FLUOROSCOPY TIME:  1 minute (40 mGy) COMPLICATIONS: None immediate. PROCEDURE: The procedure, risks, benefits, and alternatives were explained to the patient. Questions regarding the procedure were encouraged and answered. The patient understands and consents to the procedure. A timeout was performed prior to the initiation of the procedure. The left flank region was prepped with Betadine in a sterile fashion, and a sterile drape was applied covering the operative field. A sterile gown and sterile gloves were used for the procedure. Local anesthesia was provided with 1% Lidocaine with epinephrine. Ultrasound was used to localize the left kidney. Under direct ultrasound guidance, a 21 gauge needle was advanced into the renal collecting system. An ultrasound image documentation was performed. Access within the collecting system was confirmed with the efflux of urine followed by contrast injection. Over an 0.018 wire, the tract was dilated with an Accustick stent. Over a guide wire, a 10-French percutaneous nephrostomy catheter was advanced into the collecting system where the coil was formed and locked. Contrast was injected and several sport radiographs were obtained in various obliquities confirming access. The catheter was secured at the skin with a Prolene retention suture and a gravity bag was placed. A dressing was placed. The patient tolerated procedure well without immediate postprocedural complication. FINDINGS: Ultrasound scanning demonstrates a mild to moderately dilated left renal collecting system. Under direct ultrasound guidance, a posterior inferior calix was targeted allowing advancement of an 10-French percutaneous nephrostomy catheter under intermittent fluoroscopic guidance. Following percutaneous nephrostomy catheter, purulent  appearing, foul smelling urine was aspirated from the collecting system. A aspirated urine sample was capped and sent to the laboratory for analysis. Contrast injection confirmed appropriate positioning. IMPRESSION: Successful ultrasound and fluoroscopic guided placement of a left sided 10 French PCN yielding purulent urine. A sample of aspirated urine was capped and sent to the laboratory for analysis. Electronically Signed   By: Sandi Mariscal M.D.   On: 04/13/2016 08:17     Medications:   . sodium chloride 120 mL/hr at 04/14/16 0747   . docusate sodium  100 mg Oral BID  . enoxaparin (LOVENOX) injection  30 mg Subcutaneous Q24H  . finasteride  5 mg Oral Daily  . insulin aspart  0-5 Units Subcutaneous QHS  . insulin aspart  0-9 Units Subcutaneous TID WC  . insulin aspart  3 Units Subcutaneous TID WC  . insulin glargine  12 Units Subcutaneous Daily  . piperacillin-tazobactam (ZOSYN)  IV  3.375 g Intravenous Q12H  . sodium chloride flush  3 mL Intravenous Q12H  . tamsulosin  0.4 mg Oral Daily   acetaminophen **OR** acetaminophen, ondansetron **OR** ondansetron (ZOFRAN) IV, oxyCODONE-acetaminophen  Assessment/ Plan:  82 y.o. male with a PMHx of Chronic kidney disease stage III Baseline creatinine 1.7 with EGFR of 38, coronary artery disease, proximal atrial fibrillation, multiple GI bleeds, dilated cardiomyopathy, congestive heart failure, diverticular bleeds, history of urinary retention with Foley catheter placement, obstructive sleep apnea, hypertension, hyperlipidemia, diabetes mellitus type 2, who was admitted to [facility] on 04/12/2016 for evaluation of left-sided flank pain. He was found to have left-sided nephrolithiasis causing obstruction. He is now status post left nephrostomy placement.  1.  Acute renal failure/chronic kidney disease stage III/left-sided hydronephrosis and nephrolithiasis, baseline Cr 1.7/EGFR 38. The patient's acute renal failure is multifactorial with contributions from  sepsis and hydronephrosis.  - Renal function remains unchanged, for now continue IVF hydration.  Did discuss the potential for dialysis with family and they appear open to it if the need develops.  Will follow renal parameters with you.    2. Sepsis. Klebsiella and Enterobacter growing in the blood. On zosyn for now, ID following, will need left ureteral stone to be addressed eventually once infection treated.   3. Anemia of chronic kidney disease.  Hgb 9.6 at the moment, continue to monitor.    LOS: 2 Shaima Sardinas 10/18/201710:11 AM

## 2016-04-15 LAB — CULTURE, BLOOD (ROUTINE X 2)

## 2016-04-15 LAB — GLUCOSE, CAPILLARY
GLUCOSE-CAPILLARY: 177 mg/dL — AB (ref 65–99)
Glucose-Capillary: 74 mg/dL (ref 65–99)
Glucose-Capillary: 184 mg/dL — ABNORMAL HIGH (ref 65–99)
Glucose-Capillary: 96 mg/dL (ref 65–99)

## 2016-04-15 LAB — BASIC METABOLIC PANEL
ANION GAP: 8 (ref 5–15)
BUN: 46 mg/dL — ABNORMAL HIGH (ref 6–20)
CALCIUM: 7.4 mg/dL — AB (ref 8.9–10.3)
CO2: 18 mmol/L — AB (ref 22–32)
Chloride: 107 mmol/L (ref 101–111)
Creatinine, Ser: 2.67 mg/dL — ABNORMAL HIGH (ref 0.61–1.24)
GFR calc Af Amer: 23 mL/min — ABNORMAL LOW (ref >60)
GFR calc non Af Amer: 20 mL/min — ABNORMAL LOW (ref >60)
GLUCOSE: 172 mg/dL — AB (ref 65–99)
POTASSIUM: 3.1 mmol/L — AB (ref 3.5–5.1)
Sodium: 133 mmol/L — ABNORMAL LOW (ref 135–145)

## 2016-04-15 MED ORDER — CEFTRIAXONE SODIUM-DEXTROSE 1-3.74 GM-% IV SOLR
2.0000 g | Freq: Every day | INTRAVENOUS | Status: DC
  Filled 2016-04-15: qty 100

## 2016-04-15 MED ORDER — CIPROFLOXACIN IN D5W 400 MG/200ML IV SOLN
400.0000 mg | INTRAVENOUS | Status: DC
Start: 2016-04-15 — End: 2016-04-15
  Filled 2016-04-15: qty 200

## 2016-04-15 MED ORDER — POTASSIUM CHLORIDE CRYS ER 20 MEQ PO TBCR
40.0000 meq | EXTENDED_RELEASE_TABLET | Freq: Once | ORAL | Status: AC
  Administered 2016-04-15: 40 meq via ORAL
  Filled 2016-04-15: qty 2

## 2016-04-15 MED ORDER — CIPROFLOXACIN IN D5W 400 MG/200ML IV SOLN
400.0000 mg | INTRAVENOUS | Status: DC
  Administered 2016-04-15: 400 mg via INTRAVENOUS
  Filled 2016-04-15 (×2): qty 200

## 2016-04-15 MED ORDER — CEFTRIAXONE SODIUM-DEXTROSE 1-3.74 GM-% IV SOLR
1.0000 g | Freq: Every day | INTRAVENOUS | Status: DC
  Administered 2016-04-15: 1 g via INTRAVENOUS
  Filled 2016-04-15 (×2): qty 50

## 2016-04-15 MED ORDER — DEXTROSE 5 % IV SOLN: 1.0000 g | INTRAVENOUS | Status: DC

## 2016-04-15 NOTE — Progress Notes (Signed)
Physical Therapy Treatment Patient Details Name: [name] MRN: [id number] DOB: [birth year] Today's Date: 04/15/2016    History of Present Illness Pt. is a 82 y.o. male presenting with confusion, weakness, and abdominal pain. Admitted to [facility] wtih sepsis secondary to pyelonephritis, L nephrostomy tube placed currently in addition to chronic foley. Pt. elevated troponin levels attributed to demand ischemia.    PT Comments    Noted improvement in alertness and orientation this date; consistently following all commands, tolerating increase in activity level and gait distance.  Able to complete all functional activities with less physical assist from therapist; however, does continue to require use of RW and cga/close sup for optimal safety due to higher-level balance and endurance deficits. Given improvement in performance, discharge recommendations updated to reflect progress; CSW/RNCM informed and aware. Will plan to assess stairs next date (has 4-5 with R rail to enter/exit home).  Per wife, once patient enters home, will only need to do stairs "when he leaves the house"    Follow Up Recommendations  Home health PT     Equipment Recommendations  Rolling walker with 5" wheels    Recommendations for Other Services       Precautions / Restrictions Precautions Precautions: Fall Precaution Comments: L nephrostomy tube, chronic foley Restrictions Weight Bearing Restrictions: No    Mobility  Bed Mobility Overal bed mobility: Needs Assistance Bed Mobility: Supine to Sit     Supine to sit: Min assist     General bed mobility comments: assist for truncal elevation  Transfers Overall transfer level: Needs assistance Equipment used: Rolling walker (2 wheeled) Transfers: Sit to/from Stand Sit to Stand: Min guard;Min assist         General transfer comment: min cuing for hand placement; limited carry-over between trials  Ambulation/Gait Ambulation/Gait assistance: Min  guard;Min assist Ambulation Distance (Feet): 200 Feet Assistive device: Rolling walker (2 wheeled)   Gait velocity: 10' walk time, 9 seconds   General Gait Details: forward flexed posture with RW arms length anterior to patient (limited response to cuing for correction).  Reciprocal stepping pattern without overt buckling or LOB.  Occasaionally bumps corners/obstacles with RW, self-corrects with increased time.   Stairs            Wheelchair Mobility    Modified Rankin (Stroke Patients Only)       Balance Overall balance assessment: Needs assistance Sitting-balance support: No upper extremity supported;Feet supported Sitting balance-Leahy Scale: Good     Standing balance support: Bilateral upper extremity supported Standing balance-Leahy Scale: Fair                      Cognition Arousal/Alertness: Awake/alert Behavior During Therapy: WFL for tasks assessed/performed Overall Cognitive Status: Within Functional Limits for tasks assessed (marked improvement in alertness, orientation and overall participation/command following this date)                      Exercises Other Exercises Other Exercises: Standing LE therex, 1x10 with RW, cga/close sup:  heel raises, mini squats, hip flex/ext/abduct/adduct.  Cuing for postural extension and correct technique throughout Other Exercises: Toilet transfer, ambulatory with RW, cga/close sup; sit/stand from standard toilet surface, close sup with RW.    General Comments        Pertinent Vitals/Pain Pain Assessment: No/denies pain    Home Living                      Prior Function  PT Goals (current goals can now be found in the care plan section) Acute Rehab PT Goals PT Goal Formulation: With patient Time For Goal Achievement: 04/27/16 Potential to Achieve Goals: Good Progress towards PT goals: Progressing toward goals    Frequency    Min 2X/week      PT Plan Current plan  remains appropriate    Co-evaluation             End of Session Equipment Utilized During Treatment: Gait belt Activity Tolerance: Patient tolerated treatment well Patient left:  (patient in bathroom with CNA to complete toileting/hygiene end of session)     Time: [id number] PT Time Calculation (min) (ACUTE ONLY): 38 min  Charges:  $Gait Training: 8-22 mins $Therapeutic Exercise: 8-22 mins $Therapeutic Activity: 8-22 mins                    G Codes:       Yarelli Decelles H. Manson PasseyBrown, PT, DPT, NCS 04/15/16, 2:03 PM [phone]

## 2016-04-15 NOTE — Care Management (Signed)
PT has resassessed patient and has upgraded patient to home health PT.  Patient is asleep at this time, and wife is not at bedside.  I have left a home health agency list at bedside.  Will follow up.

## 2016-04-15 NOTE — Progress Notes (Signed)
[facility] INFECTIOUS DISEASE PROGRESS NOTE Date of Admission:  04/12/2016     ID: [name] is a 82 y.o. male with urosepsis, obstructive nepholithiasis Active Problems:   Severe sepsis (HCC)   Lactic acidosis   Acute renal failure (HCC)   Ureteral stone with hydronephrosis   Leukocytosis   Acute pyelonephritis   Atelectasis of left lung   Elevated troponin   Subjective: Feels much better, out of unit  ROS  Eleven systems are reviewed and negative except per hpi  Medications:  Antibiotics Given (last 72 hours)    Date/Time Action Medication Dose Rate   04/12/16 2151 Given   piperacillin-tazobactam (ZOSYN) IVPB 3.375 g 3.375 g 12.5 mL/hr   04/13/16 1044 Given   piperacillin-tazobactam (ZOSYN) IVPB 3.375 g 3.375 g 12.5 mL/hr   04/13/16 2134 Given   piperacillin-tazobactam (ZOSYN) IVPB 3.375 g 3.375 g 12.5 mL/hr   04/14/16 1034 Given   piperacillin-tazobactam (ZOSYN) IVPB 3.375 g 3.375 g 12.5 mL/hr   04/14/16 2203 Given   piperacillin-tazobactam (ZOSYN) IVPB 3.375 g 3.375 g 12.5 mL/hr   04/15/16 1139 Given   cefTRIAXone (ROCEPHIN) IVPB 1 g 1 g 100 mL/hr     . [START ON 04/16/2016] cefTRIAXone  2 g Intravenous Daily  . docusate sodium  100 mg Oral BID  . enoxaparin (LOVENOX) injection  30 mg Subcutaneous Q24H  . finasteride  5 mg Oral Daily  . insulin aspart  0-5 Units Subcutaneous QHS  . insulin aspart  0-9 Units Subcutaneous TID WC  . insulin aspart  3 Units Subcutaneous TID WC  . insulin glargine  12 Units Subcutaneous Daily  . sodium chloride flush  3 mL Intravenous Q12H  . tamsulosin  0.4 mg Oral Daily    Objective: Vital signs in last 24 hours: Temp:  [97.9 F (36.6 C)-99.6 F (37.6 C)] 97.9 F (36.6 C) (10/19 1059) Pulse Rate:  [59-78] 72 (10/19 1059) Resp:  [12-20] 12 (10/19 1059) BP: (135-150)/(64-86) 140/86 (10/19 1059) SpO2:  [94 %-97 %] 97 % (10/19 1059) Weight:  [79.6 kg (175 lb 8 oz)] 79.6 kg (175 lb 8 oz) (10/19 0500) Constitutional:  alert, interactive  HENT: anicteric Mouth/Throat: Oropharynx is clear and moist. No oropharyngeal exudate.  Cardiovascular: Normal rate,  Pulmonary/Chest: Effort normal and breath sounds normal. No respiratory distress. He has no wheezes.  Abdominal: Soft. Bowel sounds are normal. L nephrostomy tube with cloudy urine Lymphadenopathy: He has no cervical adenopathy.  Neurological: alert, non focal  Skin: Skin is warm and dry. No rash noted. No erythema.  Psychiatric: He has a normal mood and affect. His behavior is normal.  Foley in place with clear urine  Lab Results  Recent Labs  04/13/16 0340 04/14/16 0517 04/15/16 0919  WBC 11.8* 11.8*  --   HGB 10.8* 9.6*  --   HCT 31.6* 29.1*  --   NA 132* 131* 133*  K 4.2 3.6 3.1*  CL 101 104 107  CO2 21* 19* 18*  BUN 45* 50* 46*  CREATININE 3.06* 3.04* 2.67*    Microbiology: Results for orders placed or performed during the hospital encounter of 04/12/16  Blood Culture (routine x 2)     Status: Abnormal   Collection Time: 04/12/16 11:23 AM  Result Value Ref Range Status   Specimen Description BLOOD RIGHT [facility]  Final   Special Requests   Final    BOTTLES DRAWN AEROBIC AND ANAEROBIC AER ANA   Culture  Setup Time   Final  GRAM NEGATIVE RODS AEROBIC BOTTLE ONLY CRITICAL VALUE NOTED.  VALUE IS CONSISTENT WITH PREVIOUSLY REPORTED AND CALLED VALUE.    Culture (A)  Final    KLEBSIELLA PNEUMONIAE SUSCEPTIBILITIES PERFORMED ON PREVIOUS CULTURE WITHIN THE LAST 5 DAYS. Performed at [facility]    Report Status 04/15/2016 FINAL  Final  Blood Culture (routine x 2)     Status: Abnormal   Collection Time: 04/12/16 11:23 AM  Result Value Ref Range Status   Specimen Description BLOOD LEFT AC  Final   Special Requests   Final    BOTTLES DRAWN AEROBIC AND ANAEROBIC AER ANA   Culture  Setup Time   Final    GRAM NEGATIVE RODS IN BOTH AEROBIC AND ANAEROBIC BOTTLES CRITICAL RESULT CALLED TO, READ BACK BY AND VERIFIED  WITH: MATT MCBANE AT 0327 ON 04/13/16 RWW CONFIRMED BY TLB    Culture KLEBSIELLA PNEUMONIAE (A)  Final   Report Status 04/15/2016 FINAL  Final   Organism ID, Bacteria KLEBSIELLA PNEUMONIAE  Final      Susceptibility   Klebsiella pneumoniae - MIC*    AMPICILLIN >=32 RESISTANT Resistant     CEFAZOLIN <=4 SENSITIVE Sensitive     CEFEPIME <=1 SENSITIVE Sensitive     CEFTAZIDIME <=1 SENSITIVE Sensitive     CEFTRIAXONE <=1 SENSITIVE Sensitive     CIPROFLOXACIN <=0.25 SENSITIVE Sensitive     GENTAMICIN <=1 SENSITIVE Sensitive     IMIPENEM <=0.25 SENSITIVE Sensitive     TRIMETH/SULFA <=20 SENSITIVE Sensitive     AMPICILLIN/SULBACTAM <=2 SENSITIVE Sensitive     PIP/TAZO <=4 SENSITIVE Sensitive     Extended ESBL NEGATIVE Sensitive     * KLEBSIELLA PNEUMONIAE  Urine culture     Status: Abnormal   Collection Time: 04/12/16 11:23 AM  Result Value Ref Range Status   Specimen Description URINE, RANDOM  Final   Special Requests NONE  Final   Culture >=100,000 COLONIES/mL KLEBSIELLA PNEUMONIAE (A)  Final   Report Status 04/14/2016 FINAL  Final   Organism ID, Bacteria KLEBSIELLA PNEUMONIAE (A)  Final      Susceptibility   Klebsiella pneumoniae - MIC*    AMPICILLIN >=32 RESISTANT Resistant     CEFAZOLIN <=4 SENSITIVE Sensitive     CEFTRIAXONE <=1 SENSITIVE Sensitive     CIPROFLOXACIN <=0.25 SENSITIVE Sensitive     GENTAMICIN <=1 SENSITIVE Sensitive     IMIPENEM <=0.25 SENSITIVE Sensitive     NITROFURANTOIN 64 INTERMEDIATE Intermediate     TRIMETH/SULFA <=20 SENSITIVE Sensitive     AMPICILLIN/SULBACTAM 4 SENSITIVE Sensitive     PIP/TAZO <=4 SENSITIVE Sensitive     Extended ESBL NEGATIVE Sensitive     * >=100,000 COLONIES/mL KLEBSIELLA PNEUMONIAE  Blood Culture ID Panel (Reflexed)     Status: Abnormal   Collection Time: 04/12/16 11:23 AM  Result Value Ref Range Status   Enterococcus species NOT DETECTED NOT DETECTED Final   Listeria monocytogenes NOT DETECTED NOT DETECTED Final    Staphylococcus species NOT DETECTED NOT DETECTED Final   Staphylococcus aureus NOT DETECTED NOT DETECTED Final   Streptococcus species NOT DETECTED NOT DETECTED Final   Streptococcus agalactiae NOT DETECTED NOT DETECTED Final   Streptococcus pneumoniae NOT DETECTED NOT DETECTED Final   Streptococcus pyogenes NOT DETECTED NOT DETECTED Final   Acinetobacter baumannii NOT DETECTED NOT DETECTED Final   Enterobacteriaceae species DETECTED (A) NOT DETECTED Final    Comment: CRITICAL RESULT CALLED TO, READ BACK BY AND VERIFIED WITH: MATT MCBANE AT 0327 ON 04/13/16 RWW  Enterobacter cloacae complex NOT DETECTED NOT DETECTED Final   Escherichia coli NOT DETECTED NOT DETECTED Final   Klebsiella oxytoca NOT DETECTED NOT DETECTED Final   Klebsiella pneumoniae DETECTED (A) NOT DETECTED Final    Comment: CRITICAL RESULT CALLED TO, READ BACK BY AND VERIFIED WITH: MATT MCBANE AT 0327 ON 04/13/16 RWW    Proteus species NOT DETECTED NOT DETECTED Final   Serratia marcescens NOT DETECTED NOT DETECTED Final   Carbapenem resistance NOT DETECTED NOT DETECTED Final   Haemophilus influenzae NOT DETECTED NOT DETECTED Final   Neisseria meningitidis NOT DETECTED NOT DETECTED Final   Pseudomonas aeruginosa NOT DETECTED NOT DETECTED Final   Candida albicans NOT DETECTED NOT DETECTED Final   Candida glabrata NOT DETECTED NOT DETECTED Final   Candida krusei NOT DETECTED NOT DETECTED Final   Candida parapsilosis NOT DETECTED NOT DETECTED Final   Candida tropicalis NOT DETECTED NOT DETECTED Final  Aerobic/Anaerobic Culture (surgical/deep wound)     Status: None (Preliminary result)   Collection Time: 04/12/16  4:56 PM  Result Value Ref Range Status   Specimen Description KIDNEY LEFT  Final   Special Requests Normal  Final   Gram Stain   Final    ABUNDANT WBC PRESENT,BOTH PMN AND MONONUCLEAR NO ORGANISMS SEEN Performed at [facility]    Culture   Final    ABUNDANT KLEBSIELLA PNEUMONIAE CRITICAL  RESULT CALLED TO, READ BACK BY AND VERIFIED WITH: M. FLOWERS RN, AT 1114 04/13/16 BY D. VANHOOK NO ANAEROBES ISOLATED; CULTURE IN PROGRESS FOR 5 DAYS    Report Status PENDING  Incomplete  MRSA PCR Screening     Status: None   Collection Time: 04/12/16  6:19 PM  Result Value Ref Range Status   MRSA by PCR NEGATIVE NEGATIVE Final    Comment:        The GeneXpert MRSA Assay (FDA approved for NASAL specimens only), is one component of a comprehensive MRSA colonization surveillance program. It is not intended to diagnose MRSA infection nor to guide or monitor treatment for MRSA infections.     Studies/Results: Ct Abdomen Pelvis Wo Contrast  Result Date: 04/12/2016 CLINICAL DATA:  confusion and weakness since 6am. Family reports patient complaining of abdominal pain yesterday and taking pain medication and becoming sleepier than usual. Patient feels hot to the touch. Patient is extremely weak and confused. This RN had difficulty getting patient from the vehicle. Hx of cholecystectomy and inguinal hernia repair EXAM: CT ABDOMEN AND PELVIS WITHOUT CONTRAST TECHNIQUE: Multidetector CT imaging of the abdomen and pelvis was performed following the standard protocol without IV contrast. COMPARISON:  04/30/2009 FINDINGS: Lower chest: Patchy subsegmental atelectasis or scarring posteriorly in the visualized lung bases left greater than right. Coronary calcifications. Four-chamber cardiac enlargement. Blood pool less dense than the interventricular septum suggesting anemia. Hepatobiliary: No focal liver abnormality is seen. Status post cholecystectomy. No biliary dilatation. Pancreas: Mild diffuse atrophy without mass or ductal dilatation. Spleen: Normal in size without focal abnormality. Adrenals/Urinary Tract: Stable 15 mm left adrenal nodule. Moderate left hydronephrosis and marked ureterectasis down to the level of a 15 mm mid ureteral calculus. Foley catheter decompresses the urinary bladder. 8 mm  calculus in the lumen of the urinary bladder. Right kidney and adrenal gland unremarkable. Stomach/Bowel: Stomach, small bowel, and colon are nondilated. Scattered descending and sigmoid diverticula. Vascular/Lymphatic: Moderate aortoiliac arterial calcifications. Right common iliac artery dilated 2.2 cm diameter, left also 2.2 cm. Reproductive: Prostatic enlargement.  Foley catheter in place. Other: No ascites.  No  free air. Musculoskeletal: Degenerative disc disease L4-5 and L5-S1. Negative for fracture or dislocation. IMPRESSION: 1. Obstructing 15 mm left ureteral calculus. Moderate left hydronephrosis and proximal ureterectasis. 2. 8 mm urinary bladder calculus. 3. Descending and sigmoid diverticulosis. 4. Cardiomegaly with coronary calcifications. 5. 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 Electronically Signed   By: Corlis Leak M.D.   On: 04/12/2016 13:37   [place] Intraoperative  Result Date: 04/12/2016 CLINICAL DATA:  Ultrasound was provided for use by the ordering physician, and a technical charge was applied by the performing facility.  No radiologist interpretation/professional services rendered.   Dg Chest Port 1 View  Result Date: 04/12/2016 CLINICAL DATA:  Confusion and weakness since 6 a.m. EXAM: PORTABLE CHEST 1 VIEW COMPARISON:  11/22/2015 FINDINGS: Subtle patchy densities in the left lower lobe appear to be new. Remainder of the lungs are clear. Stable enlargement of the cardiac silhouette. Patient is rotated towards the right which is similar to the prior examination. No acute bone abnormality. IMPRESSION: Subtle densities at the left lung base. Although these findings could be related atelectasis, subtle infection cannot be excluded in this area. Stable cardiomegaly. Electronically Signed   By: Richarda Overlie M.D.   On: 04/12/2016 11:45   Ir Nephrostomy Placement Left  Result Date: 04/13/2016 INDICATION: Obstructing left-sided ureteral stone  with clinical symptoms worrisome for impending urosepsis. Request made for placement of an ultrasound fluoroscopic guided left-sided percutaneous nephrostomy catheter. EXAM: 1. ULTRASOUND GUIDANCE FOR PUNCTURE OF THE LEFT RENAL COLLECTING SYSTEM 2. LEFT PERCUTANEOUS NEPHROSTOMY TUBE PLACEMENT. COMPARISON:  CT the abdomen pelvis - 04/12/2016 MEDICATIONS: The patient is currently admitted to the hospital and receiving intravenous antibiotics; The antibiotic was administered in an appropriate time frame prior to skin puncture. ANESTHESIA/SEDATION: Moderate (conscious) sedation was employed during this procedure. A total of Fentanyl 75 mcg was administered intravenously. Moderate Sedation Time: 20 minutes. The patient's level of consciousness and vital signs were monitored continuously by radiology nursing throughout the procedure under my direct supervision. CONTRAST:  15 mL Isovue 300 administered into the collecting system FLUOROSCOPY TIME:  1 minute (40 mGy) COMPLICATIONS: None immediate. PROCEDURE: The procedure, risks, benefits, and alternatives were explained to the patient. Questions regarding the procedure were encouraged and answered. The patient understands and consents to the procedure. A timeout was performed prior to the initiation of the procedure. The left flank region was prepped with Betadine in a sterile fashion, and a sterile drape was applied covering the operative field. A sterile gown and sterile gloves were used for the procedure. Local anesthesia was provided with 1% Lidocaine with epinephrine. Ultrasound was used to localize the left kidney. Under direct ultrasound guidance, a 21 gauge needle was advanced into the renal collecting system. An ultrasound image documentation was performed. Access within the collecting system was confirmed with the efflux of urine followed by contrast injection. Over an 0.018 wire, the tract was dilated with an Accustick stent. Over a guide wire, a 10-French  percutaneous nephrostomy catheter was advanced into the collecting system where the coil was formed and locked. Contrast was injected and several sport radiographs were obtained in various obliquities confirming access. The catheter was secured at the skin with a Prolene retention suture and a gravity bag was placed. A dressing was placed. The patient tolerated procedure well without immediate postprocedural complication. FINDINGS: Ultrasound scanning demonstrates a mild to moderately dilated left renal collecting system. Under direct ultrasound guidance, a posterior inferior calix was targeted allowing advancement of  an 10-French percutaneous nephrostomy catheter under intermittent fluoroscopic guidance. Following percutaneous nephrostomy catheter, purulent appearing, foul smelling urine was aspirated from the collecting system. A aspirated urine sample was capped and sent to the laboratory for analysis. Contrast injection confirmed appropriate positioning. IMPRESSION: Successful ultrasound and fluoroscopic guided placement of a left sided 10 French PCN yielding purulent urine. A sample of aspirated urine was capped and sent to the laboratory for analysis. Electronically Signed   By: Simonne ComeJohn  Watts M.D.   On: 04/13/2016 08:17     Assessment/Plan: [name] is a 82 y.o. male with obstructive nephrolithiasis and pyelonephritis and bacteremia from Klebsiella PNA.  Clinically much improved. Organism relatively sensitive  Recommendations Agree with cipro- WIll need minimun 2 weeks therapy Will need urologic followup for management of the obstructive stone, and the urinary retention ASAP as infection likely to recur.   Thank you very much for the consult. Will follow with you.  Tamula Morrical P   04/15/2016, 7:26 PM

## 2016-04-15 NOTE — Care Management Important Message (Signed)
Important Message  Patient Details  Name: [name] MRN: [id number] Date of Birth: 1927-09-29   Medicare Important Message Given:  Yes    Chapman Fitch, RN 04/15/2016, 3:10 PM

## 2016-04-15 NOTE — Progress Notes (Signed)
ANTIBIOTIC CONSULT NOTE - INITIAL  Pharmacy Consult for Ciprofloxacin  Indication: bacteremia  Allergies  Allergen Reactions  . Exforge [Amlodipine Besylate-Valsartan] Swelling and Other (See Comments)    Pt states that he is unable to swallow.    . Lisinopril Swelling and Other (See Comments)    Pt states that he is unable to swallow.    . Niacin And Related Swelling and Other (See Comments)    Pt states that he is unable to swallow.      Patient Measurements: Height: 5\' 5"  (165.1 cm) Weight: 175 lb 8 oz (79.6 kg) IBW/kg (Calculated) : 61.5 Adjusted Body Weight:   Vital Signs: Temp: 97.9 F (36.6 C) (10/19 1059) Temp Source: Oral (10/19 1059) BP: 140/86 (10/19 1059) Pulse Rate: 72 (10/19 1059) Intake/Output from previous day: 10/18 0701 - 10/19 0700 In: 3426 [P.O.:956; I.V.:2360; IV Piggyback:100] Out: 1876 [Urine:1875; Stool:1] Intake/Output from this shift: No intake/output data recorded.  Labs:  Recent Labs  04/13/16 0340 04/14/16 0517 04/15/16 0919  WBC 11.8* 11.8*  --   HGB 10.8* 9.6*  --   PLT 129* 128*  --   CREATININE 3.06* 3.04* 2.67*   Estimated Creatinine Clearance: 19.3 mL/min (by C-G formula based on SCr of 2.67 mg/dL (H)). No results for input(s): VANCOTROUGH, VANCOPEAK, VANCORANDOM, GENTTROUGH, GENTPEAK, GENTRANDOM, TOBRATROUGH, TOBRAPEAK, TOBRARND, AMIKACINPEAK, AMIKACINTROU, AMIKACIN in the last 72 hours.   Microbiology: Recent Results (from the past 720 hour(s))  Blood Culture (routine x 2)     Status: Abnormal   Collection Time: 04/12/16 11:23 AM  Result Value Ref Range Status   Specimen Description BLOOD RIGHT AC  Final   Special Requests   Final    BOTTLES DRAWN AEROBIC AND ANAEROBIC AER ANA   Culture  Setup Time   Final    GRAM NEGATIVE RODS AEROBIC BOTTLE ONLY CRITICAL VALUE NOTED.  VALUE IS CONSISTENT WITH PREVIOUSLY REPORTED AND CALLED VALUE.    Culture (A)  Final    KLEBSIELLA PNEUMONIAE SUSCEPTIBILITIES PERFORMED ON  PREVIOUS CULTURE WITHIN THE LAST 5 DAYS. Performed at [facility]    Report Status 04/15/2016 FINAL  Final  Blood Culture (routine x 2)     Status: Abnormal   Collection Time: 04/12/16 11:23 AM  Result Value Ref Range Status   Specimen Description BLOOD LEFT AC  Final   Special Requests   Final    BOTTLES DRAWN AEROBIC AND ANAEROBIC AER ANA   Culture  Setup Time   Final    GRAM NEGATIVE RODS IN BOTH AEROBIC AND ANAEROBIC BOTTLES CRITICAL RESULT CALLED TO, READ BACK BY AND VERIFIED WITH: MATT MCBANE AT 0327 ON 04/13/16 RWW CONFIRMED BY TLB    Culture KLEBSIELLA PNEUMONIAE (A)  Final   Report Status 04/15/2016 FINAL  Final   Organism ID, Bacteria KLEBSIELLA PNEUMONIAE  Final      Susceptibility   Klebsiella pneumoniae - MIC*    AMPICILLIN >=32 RESISTANT Resistant     CEFAZOLIN <=4 SENSITIVE Sensitive     CEFEPIME <=1 SENSITIVE Sensitive     CEFTAZIDIME <=1 SENSITIVE Sensitive     CEFTRIAXONE <=1 SENSITIVE Sensitive     CIPROFLOXACIN <=0.25 SENSITIVE Sensitive     GENTAMICIN <=1 SENSITIVE Sensitive     IMIPENEM <=0.25 SENSITIVE Sensitive     TRIMETH/SULFA <=20 SENSITIVE Sensitive     AMPICILLIN/SULBACTAM <=2 SENSITIVE Sensitive     PIP/TAZO <=4 SENSITIVE Sensitive     Extended ESBL NEGATIVE Sensitive     *  KLEBSIELLA PNEUMONIAE  Urine culture     Status: Abnormal   Collection Time: 04/12/16 11:23 AM  Result Value Ref Range Status   Specimen Description URINE, RANDOM  Final   Special Requests NONE  Final   Culture >=100,000 COLONIES/mL KLEBSIELLA PNEUMONIAE (A)  Final   Report Status 04/14/2016 FINAL  Final   Organism ID, Bacteria KLEBSIELLA PNEUMONIAE (A)  Final      Susceptibility   Klebsiella pneumoniae - MIC*    AMPICILLIN >=32 RESISTANT Resistant     CEFAZOLIN <=4 SENSITIVE Sensitive     CEFTRIAXONE <=1 SENSITIVE Sensitive     CIPROFLOXACIN <=0.25 SENSITIVE Sensitive     GENTAMICIN <=1 SENSITIVE Sensitive     IMIPENEM <=0.25 SENSITIVE Sensitive      NITROFURANTOIN 64 INTERMEDIATE Intermediate     TRIMETH/SULFA <=20 SENSITIVE Sensitive     AMPICILLIN/SULBACTAM 4 SENSITIVE Sensitive     PIP/TAZO <=4 SENSITIVE Sensitive     Extended ESBL NEGATIVE Sensitive     * >=100,000 COLONIES/mL KLEBSIELLA PNEUMONIAE  Blood Culture ID Panel (Reflexed)     Status: Abnormal   Collection Time: 04/12/16 11:23 AM  Result Value Ref Range Status   Enterococcus species NOT DETECTED NOT DETECTED Final   Listeria monocytogenes NOT DETECTED NOT DETECTED Final   Staphylococcus species NOT DETECTED NOT DETECTED Final   Staphylococcus aureus NOT DETECTED NOT DETECTED Final   Streptococcus species NOT DETECTED NOT DETECTED Final   Streptococcus agalactiae NOT DETECTED NOT DETECTED Final   Streptococcus pneumoniae NOT DETECTED NOT DETECTED Final   Streptococcus pyogenes NOT DETECTED NOT DETECTED Final   Acinetobacter baumannii NOT DETECTED NOT DETECTED Final   Enterobacteriaceae species DETECTED (A) NOT DETECTED Final    Comment: CRITICAL RESULT CALLED TO, READ BACK BY AND VERIFIED WITH: MATT MCBANE AT 0327 ON 04/13/16 RWW    Enterobacter cloacae complex NOT DETECTED NOT DETECTED Final   Escherichia coli NOT DETECTED NOT DETECTED Final   Klebsiella oxytoca NOT DETECTED NOT DETECTED Final   Klebsiella pneumoniae DETECTED (A) NOT DETECTED Final    Comment: CRITICAL RESULT CALLED TO, READ BACK BY AND VERIFIED WITH: MATT MCBANE AT 0327 ON 04/13/16 RWW    Proteus species NOT DETECTED NOT DETECTED Final   Serratia marcescens NOT DETECTED NOT DETECTED Final   Carbapenem resistance NOT DETECTED NOT DETECTED Final   Haemophilus influenzae NOT DETECTED NOT DETECTED Final   Neisseria meningitidis NOT DETECTED NOT DETECTED Final   Pseudomonas aeruginosa NOT DETECTED NOT DETECTED Final   Candida albicans NOT DETECTED NOT DETECTED Final   Candida glabrata NOT DETECTED NOT DETECTED Final   Candida krusei NOT DETECTED NOT DETECTED Final   Candida parapsilosis NOT  DETECTED NOT DETECTED Final   Candida tropicalis NOT DETECTED NOT DETECTED Final  Aerobic/Anaerobic Culture (surgical/deep wound)     Status: None (Preliminary result)   Collection Time: 04/12/16  4:56 PM  Result Value Ref Range Status   Specimen Description KIDNEY LEFT  Final   Special Requests Normal  Final   Gram Stain   Final    ABUNDANT WBC PRESENT,BOTH PMN AND MONONUCLEAR NO ORGANISMS SEEN Performed at [facility]    Culture   Final    ABUNDANT KLEBSIELLA PNEUMONIAE CRITICAL RESULT CALLED TO, READ BACK BY AND VERIFIED WITH: M. FLOWERS RN, AT 1114 04/13/16 BY D. VANHOOK NO ANAEROBES ISOLATED; CULTURE IN PROGRESS FOR 5 DAYS    Report Status PENDING  Incomplete  MRSA PCR Screening     Status: None   Collection  Time: 04/12/16  6:19 PM  Result Value Ref Range Status   MRSA by PCR NEGATIVE NEGATIVE Final    Comment:        The GeneXpert MRSA Assay (FDA approved for NASAL specimens only), is one component of a comprehensive MRSA colonization surveillance program. It is not intended to diagnose MRSA infection nor to guide or monitor treatment for MRSA infections.     Medical History: Past Medical History:  Diagnosis Date  . BPH (benign prostatic hypertrophy)   . Coronary artery disease   . Diverticulosis   . Essential hypertension, benign   . GI bleed 2015  . Hypersomnia with sleep apnea, unspecified   . MI (myocardial infarction)   . Other and unspecified hyperlipidemia   . Pure hypercholesterolemia   . Type II or unspecified type diabetes mellitus without mention of complication, uncontrolled     Medications:  Scheduled:  . ciprofloxacin  400 mg Intravenous Q24H  . ciprofloxacin  400 mg Intravenous Q24H  . docusate sodium  100 mg Oral BID  . enoxaparin (LOVENOX) injection  30 mg Subcutaneous Q24H  . finasteride  5 mg Oral Daily  . insulin aspart  0-5 Units Subcutaneous QHS  . insulin aspart  0-9 Units Subcutaneous TID WC  . insulin aspart  3 Units  Subcutaneous TID WC  . insulin glargine  12 Units Subcutaneous Daily  . sodium chloride flush  3 mL Intravenous Q12H  . tamsulosin  0.4 mg Oral Daily   Assessment: CrCl = 19.3 ml/min    Goal of Therapy:  resolution of infection  Plan:  Expected duration 7 days with resolution of temperature and/or normalization of WBC   Ciprofloxacin 400 mg IV Q24H ordered to start 10/19 @ 22:00.   Joden Bonsall D 04/15/2016,8:46 PM

## 2016-04-15 NOTE — Progress Notes (Signed)
[facility]  ROUNDING NOTE   Subjective:  Patient appears to be improving. Creatinine down to 2.6. Good urine output noted as well. Serum sodium also improved to 133.    Objective:  Vital signs in last 24 hours:  Temp:  [97.9 F (36.6 C)-99.6 F (37.6 C)] 97.9 F (36.6 C) (10/19 1059) Pulse Rate:  [59-93] 72 (10/19 1059) Resp:  [12-20] 12 (10/19 1059) BP: (135-150)/(64-86) 140/86 (10/19 1059) SpO2:  [93 %-97 %] 97 % (10/19 1059) Weight:  [79.6 kg (175 lb 8 oz)] 79.6 kg (175 lb 8 oz) (10/19 0500)  Weight change: 2.313 kg (5 lb 1.6 oz) Filed Weights   04/12/16 2000 04/14/16 0446 04/15/16 0500  Weight: 81.4 kg (179 lb 7.3 oz) 77.3 kg (170 lb 6.4 oz) 79.6 kg (175 lb 8 oz)    Intake/Output: I/O last 3 completed shifts: In: 4987 [P.O.:1256; I.V.:3582; Other:20; IV Piggyback:129] Out: 2801 [Urine:2800; Stool:1]   Intake/Output this shift:  Total I/O In: 5 [Other:5] Out: -   Physical Exam: General: No acute distress, sitting up in bed  Head: Normocephalic, atraumatic. Moist oral mucosal membranes  Eyes: Anicteric  Neck: Supple, trachea midline  Lungs:  Clear to auscultation, normal effort  Heart: S1S2 no rubs  Abdomen:  Soft, nontender, bowel sounds present   Extremities: trace peripheral edema.  Neurologic: Awake, alert, follows commands  Skin: No lesions       Basic Metabolic Panel:  Recent Labs Lab 04/12/16 1123 04/12/16 1748 04/13/16 0340 04/14/16 0517 04/15/16 0919  NA 131*  --  132* 131* 133*  K 4.1  --  4.2 3.6 3.1*  CL 99*  --  101 104 107  CO2 18*  --  21* 19* 18*  GLUCOSE 209*  --  146* 119* 172*  BUN 42*  --  45* 50* 46*  CREATININE 3.01* 2.94* 3.06* 3.04* 2.67*  CALCIUM 8.6*  --  7.8* 7.4* 7.4*    Liver Function Tests:  Recent Labs Lab 04/12/16 1123  AST 26  ALT 12*  ALKPHOS 74  BILITOT 1.4*  PROT 7.9  ALBUMIN 3.3*    Recent Labs Lab 04/12/16 1123  LIPASE 19   No results for input(s): AMMONIA in the last 168  hours.  CBC:  Recent Labs Lab 04/12/16 1123 04/12/16 1748 04/13/16 0340 04/14/16 0517  WBC 14.9* 14.5* 11.8* 11.8*  NEUTROABS 12.9*  --   --  10.3*  HGB 11.0* 10.2* 10.8* 9.6*  HCT 31.6* 30.6* 31.6* 29.1*  MCV 85.1 80.8 82.1 80.7  PLT 175 150 129* 128*    Cardiac Enzymes:  Recent Labs Lab 04/12/16 1123 04/12/16 1748 04/12/16 2255 04/13/16 0340 04/13/16 1010  TROPONINI 0.07* 0.10* 0.40* 0.86* 0.57*    BNP: Invalid input(s): POCBNP  CBG:  Recent Labs Lab 04/14/16 0724 04/14/16 1116 04/14/16 1637 04/14/16 2118 04/15/16 0728  GLUCAP 118* 166* 89 62 74    Microbiology: Results for orders placed or performed during the hospital encounter of 04/12/16  Blood Culture (routine x 2)     Status: Abnormal   Collection Time: 04/12/16 11:23 AM  Result Value Ref Range Status   Specimen Description BLOOD RIGHT AC  Final   Special Requests   Final    BOTTLES DRAWN AEROBIC AND ANAEROBIC AER 4ML ANA 2ML   Culture  Setup Time   Final    GRAM NEGATIVE RODS AEROBIC BOTTLE ONLY CRITICAL VALUE NOTED.  VALUE IS CONSISTENT WITH PREVIOUSLY REPORTED AND CALLED VALUE.    Culture (A)  Final    KLEBSIELLA PNEUMONIAE SUSCEPTIBILITIES PERFORMED ON PREVIOUS CULTURE WITHIN THE LAST 5 DAYS. Performed at [facility]    Report Status 04/15/2016 FINAL  Final  Blood Culture (routine x 2)     Status: Abnormal   Collection Time: 04/12/16 11:23 AM  Result Value Ref Range Status   Specimen Description BLOOD LEFT AC  Final   Special Requests   Final    BOTTLES DRAWN AEROBIC AND ANAEROBIC AER 5ML ANA 3ML   Culture  Setup Time   Final    GRAM NEGATIVE RODS ANAEROBIC BOTTLE ONLY CRITICAL RESULT CALLED TO, READ BACK BY AND VERIFIED WITH: MATT MCBANE AT 0327 ON 04/13/16 RWW CONFIRMED BY TLB Performed at [facility] (A)  Final   Report Status 04/15/2016 FINAL  Final   Organism ID, Bacteria KLEBSIELLA PNEUMONIAE  Final      Susceptibility    Klebsiella pneumoniae - MIC*    AMPICILLIN >=32 RESISTANT Resistant     CEFAZOLIN <=4 SENSITIVE Sensitive     CEFEPIME <=1 SENSITIVE Sensitive     CEFTAZIDIME <=1 SENSITIVE Sensitive     CEFTRIAXONE <=1 SENSITIVE Sensitive     CIPROFLOXACIN <=0.25 SENSITIVE Sensitive     GENTAMICIN <=1 SENSITIVE Sensitive     IMIPENEM <=0.25 SENSITIVE Sensitive     TRIMETH/SULFA <=20 SENSITIVE Sensitive     AMPICILLIN/SULBACTAM <=2 SENSITIVE Sensitive     PIP/TAZO <=4 SENSITIVE Sensitive     Extended ESBL NEGATIVE Sensitive     * KLEBSIELLA PNEUMONIAE  Urine culture     Status: Abnormal   Collection Time: 04/12/16 11:23 AM  Result Value Ref Range Status   Specimen Description URINE, RANDOM  Final   Special Requests NONE  Final   Culture >=100,000 COLONIES/mL KLEBSIELLA PNEUMONIAE (A)  Final   Report Status 04/14/2016 FINAL  Final   Organism ID, Bacteria KLEBSIELLA PNEUMONIAE (A)  Final      Susceptibility   Klebsiella pneumoniae - MIC*    AMPICILLIN >=32 RESISTANT Resistant     CEFAZOLIN <=4 SENSITIVE Sensitive     CEFTRIAXONE <=1 SENSITIVE Sensitive     CIPROFLOXACIN <=0.25 SENSITIVE Sensitive     GENTAMICIN <=1 SENSITIVE Sensitive     IMIPENEM <=0.25 SENSITIVE Sensitive     NITROFURANTOIN 64 INTERMEDIATE Intermediate     TRIMETH/SULFA <=20 SENSITIVE Sensitive     AMPICILLIN/SULBACTAM 4 SENSITIVE Sensitive     PIP/TAZO <=4 SENSITIVE Sensitive     Extended ESBL NEGATIVE Sensitive     * >=100,000 COLONIES/mL KLEBSIELLA PNEUMONIAE  Blood Culture ID Panel (Reflexed)     Status: Abnormal   Collection Time: 04/12/16 11:23 AM  Result Value Ref Range Status   Enterococcus species NOT DETECTED NOT DETECTED Final   Listeria monocytogenes NOT DETECTED NOT DETECTED Final   Staphylococcus species NOT DETECTED NOT DETECTED Final   Staphylococcus aureus NOT DETECTED NOT DETECTED Final   Streptococcus species NOT DETECTED NOT DETECTED Final   Streptococcus agalactiae NOT DETECTED NOT DETECTED Final    Streptococcus pneumoniae NOT DETECTED NOT DETECTED Final   Streptococcus pyogenes NOT DETECTED NOT DETECTED Final   Acinetobacter baumannii NOT DETECTED NOT DETECTED Final   Enterobacteriaceae species DETECTED (A) NOT DETECTED Final    Comment: CRITICAL RESULT CALLED TO, READ BACK BY AND VERIFIED WITH: MATT MCBANE AT 0327 ON 04/13/16 RWW    Enterobacter cloacae complex NOT DETECTED NOT DETECTED Final   Escherichia coli NOT DETECTED NOT DETECTED Final   Klebsiella  oxytoca NOT DETECTED NOT DETECTED Final   Klebsiella pneumoniae DETECTED (A) NOT DETECTED Final    Comment: CRITICAL RESULT CALLED TO, READ BACK BY AND VERIFIED WITH: MATT MCBANE AT 0327 ON 04/13/16 RWW    Proteus species NOT DETECTED NOT DETECTED Final   Serratia marcescens NOT DETECTED NOT DETECTED Final   Carbapenem resistance NOT DETECTED NOT DETECTED Final   Haemophilus influenzae NOT DETECTED NOT DETECTED Final   Neisseria meningitidis NOT DETECTED NOT DETECTED Final   Pseudomonas aeruginosa NOT DETECTED NOT DETECTED Final   Candida albicans NOT DETECTED NOT DETECTED Final   Candida glabrata NOT DETECTED NOT DETECTED Final   Candida krusei NOT DETECTED NOT DETECTED Final   Candida parapsilosis NOT DETECTED NOT DETECTED Final   Candida tropicalis NOT DETECTED NOT DETECTED Final  Aerobic/Anaerobic Culture (surgical/deep wound)     Status: None (Preliminary result)   Collection Time: 04/12/16  4:56 PM  Result Value Ref Range Status   Specimen Description KIDNEY LEFT  Final   Special Requests Normal  Final   Gram Stain   Final    ABUNDANT WBC PRESENT,BOTH PMN AND MONONUCLEAR NO ORGANISMS SEEN Performed at [facility]    Culture   Final    ABUNDANT KLEBSIELLA PNEUMONIAE CRITICAL RESULT CALLED TO, READ BACK BY AND VERIFIED WITH: M. FLOWERS RN, AT 1114 04/13/16 BY D. VANHOOK NO ANAEROBES ISOLATED; CULTURE IN PROGRESS FOR 5 DAYS    Report Status PENDING  Incomplete  MRSA PCR Screening     Status: None    Collection Time: 04/12/16  6:19 PM  Result Value Ref Range Status   MRSA by PCR NEGATIVE NEGATIVE Final    Comment:        The GeneXpert MRSA Assay (FDA approved for NASAL specimens only), is one component of a comprehensive MRSA colonization surveillance program. It is not intended to diagnose MRSA infection nor to guide or monitor treatment for MRSA infections.     Coagulation Studies: No results for input(s): LABPROT, INR in the last 72 hours.  Urinalysis: No results for input(s): COLORURINE, LABSPEC, PHURINE, GLUCOSEU, HGBUR, BILIRUBINUR, KETONESUR, PROTEINUR, UROBILINOGEN, NITRITE, LEUKOCYTESUR in the last 72 hours.  Invalid input(s): APPERANCEUR    Imaging: No results found.   Medications:   . sodium chloride 120 mL/hr at 04/15/16 1037   . cefTRIAXone  1 g Intravenous Daily  . docusate sodium  100 mg Oral BID  . enoxaparin (LOVENOX) injection  30 mg Subcutaneous Q24H  . finasteride  5 mg Oral Daily  . insulin aspart  0-5 Units Subcutaneous QHS  . insulin aspart  0-9 Units Subcutaneous TID WC  . insulin aspart  3 Units Subcutaneous TID WC  . insulin glargine  12 Units Subcutaneous Daily  . sodium chloride flush  3 mL Intravenous Q12H  . tamsulosin  0.4 mg Oral Daily   acetaminophen **OR** acetaminophen, ondansetron **OR** ondansetron (ZOFRAN) IV, oxyCODONE-acetaminophen  Assessment/ Plan:  82 y.o. male with a PMHx of Chronic kidney disease stage III Baseline creatinine 1.7 with EGFR of 38, coronary artery disease, proximal atrial fibrillation, multiple GI bleeds, dilated cardiomyopathy, congestive heart failure, diverticular bleeds, history of urinary retention with Foley catheter placement, obstructive sleep apnea, hypertension, hyperlipidemia, diabetes mellitus type 2, who was admitted to [facility] on 04/12/2016 for evaluation of left-sided flank pain. He was found to have left-sided nephrolithiasis causing obstruction. He is now status post left nephrostomy  placement.  1.  Acute renal failure/chronic kidney disease stage III/left-sided hydronephrosis and nephrolithiasis, baseline Cr  1.7/EGFR 38. The patient's acute renal failure is multifactorial with contributions from sepsis and hydronephrosis.  - Renal function now improving. Creatinine down to 2.67.  Continue IV fluid hydration with 0.9 normal saline. Follow renal function daily. Avoid nephrotoxins as possible.  2. Sepsis. Klebsiella and Enterobacter growing in the blood. Patient was initially on Zosyn but now has been transitioned to ceftriaxone.  3. Anemia of chronic kidney disease.  Hgb 9.6 at the moment, continue to monitor.    LOS: 3 [name] 10/19/201711:33 AM

## 2016-04-15 NOTE — Progress Notes (Signed)
Pharmacy Antibiotic Note  [name] is a 82 y.o. male admitted on 04/12/2016 with pyelonephritis/bactermia from klebsiella.  Pharmacy has been consulted for ciprofloxacin dosing.  Plan: Ciprofloxacin 400mg  IV Q24hr.    Height: 5\' 5"  (165.1 cm) Weight: 175 lb 8 oz (79.6 kg) IBW/kg (Calculated) : 61.5  Temp (24hrs), Avg:98.9 F (37.2 C), Min:97.9 F (36.6 C), Max:99.6 F (37.6 C)   Recent Labs Lab 04/12/16 1123 04/12/16 1748 04/13/16 0340 04/14/16 0517 04/15/16 0919  WBC 14.9* 14.5* 11.8* 11.8*  --   CREATININE 3.01* 2.94* 3.06* 3.04* 2.67*  LATICACIDVEN 4.0* 1.2  --   --   --     Estimated Creatinine Clearance: 19.3 mL/min (by C-G formula based on SCr of 2.67 mg/dL (H)).    Allergies  Allergen Reactions  . Exforge [Amlodipine Besylate-Valsartan] Swelling and Other (See Comments)    Pt states that he is unable to swallow.    . Lisinopril Swelling and Other (See Comments)    Pt states that he is unable to swallow.    . Niacin And Related Swelling and Other (See Comments)    Pt states that he is unable to swallow.      Antimicrobials this admission: Piperacillin/tazobactam 10/18 >> 10/18 Ceftriaxone 10/19 >> 10/19 Ciprofloxacin 10/20 >>   Dose adjustments this admission: N/A  Microbiology results: 10/16 BCx: klebsiella pneumoniae 10/16 UCx: klebsiella pneumoniae 10/16 Sputum: pending 10/16 MRSA PCR: negative  Pharmacy will continue to monitor and adjust per protocol.   [name] 04/15/2016 8:04 PM

## 2016-04-15 NOTE — Progress Notes (Addendum)
[facility] [place] [facility]   PATIENT NAME: [name]    MR#:  [id number]  DATE OF BIRTH:  [birth year]  SUBJECTIVE:  CHIEF COMPLAINT:  Patient is Feeling fine no new complaints, wife at bedside. Wants to go home  REVIEW OF SYSTEMS:  CONSTITUTIONAL: No fever, fatigue . Reporting weakness.  EYES: No blurred or double vision.  EARS, NOSE, AND THROAT: No tinnitus or ear pain.  RESPIRATORY: No cough, shortness of breath, wheezing or hemoptysis.  CARDIOVASCULAR: No chest pain, orthopnea, edema.  GASTROINTESTINAL: No nausea, vomiting, diarrhea or abdominal pain.  GENITOURINARY: No dysuria, hematuria.  ENDOCRINE: No polyuria, nocturia,  HEMATOLOGY: No anemia, easy bruising or bleeding SKIN: No rash or lesion. MUSCULOSKELETAL: Some left flank pain,Drain No joint pain or arthritis.   NEUROLOGIC: No tingling, numbness, weakness.  PSYCHIATRY: No anxiety or depression.   DRUG ALLERGIES:   Allergies  Allergen Reactions  . Exforge [Amlodipine Besylate-Valsartan] Swelling and Other (See Comments)    Pt states that he is unable to swallow.    . Lisinopril Swelling and Other (See Comments)    Pt states that he is unable to swallow.    . Niacin And Related Swelling and Other (See Comments)    Pt states that he is unable to swallow.      VITALS:  Blood pressure 140/86, pulse 72, temperature 97.9 F (36.6 C), temperature source Oral, resp. rate 12, height 5\' 5"  (1.651 m), weight 79.6 kg (175 lb 8 oz), SpO2 97 %.  PHYSICAL EXAMINATION:  GENERAL:  82 y.o.-year-old patient lying in the bed with no acute distress.  EYES: Pupils equal, round, reactive to light and accommodation. No scleral icterus. Extraocular muscles intact.  HEENT: Head atraumatic, normocephalic. Oropharynx and nasopharynx clear.  NECK:  Supple, no jugular venous distention. No thyroid enlargement, no tenderness.  LUNGS: Normal breath sounds bilaterally, no wheezing, rales,rhonchi or  crepitation. No use of accessory muscles of respiration.  CARDIOVASCULAR: S1, S2 normal. No murmurs, rubs, or gallops.  ABDOMEN: Soft, nontender, nondistended.Left flank tenderness improved Bowel sounds present. No organomegaly or mass. Yellow urine in the drain EXTREMITIES: No pedal edema, cyanosis, or clubbing.  NEUROLOGIC: Cranial nerves II through XII are intact. Muscle strength at his baseline Sensation intact. Gait not checked.  PSYCHIATRIC: The patient is alert and oriented x 2-3 SKIN: No obvious rash, lesion, or ulcer.    LABORATORY PANEL:   CBC  Recent Labs Lab 04/14/16 0517  WBC 11.8*  HGB 9.6*  HCT 29.1*  PLT 128*   ------------------------------------------------------------------------------------------------------------------  Chemistries   Recent Labs Lab 04/12/16 1123  04/15/16 0919  NA 131*  < > 133*  K 4.1  < > 3.1*  CL 99*  < > 107  CO2 18*  < > 18*  GLUCOSE 209*  < > 172*  BUN 42*  < > 46*  CREATININE 3.01*  < > 2.67*  CALCIUM 8.6*  < > 7.4*  AST 26  --   --   ALT 12*  --   --   ALKPHOS 74  --   --   BILITOT 1.4*  --   --   < > = values in this interval not displayed. ------------------------------------------------------------------------------------------------------------------  Cardiac Enzymes  Recent Labs Lab 04/13/16 1010  TROPONINI 0.57*   ------------------------------------------------------------------------------------------------------------------  RADIOLOGY:  No results found.  EKG:   Orders placed or performed during the hospital encounter of 04/12/16  . EKG 12-Lead  . EKG 12-Lead  . EKG 12-Lead  .  EKG 12-Lead    ASSESSMENT AND PLAN:    #1. Sepsis due to acute pyelonephritis/obstructive stone-leading to metabolic encephalopathy Encephalopathy resolved mentation close to baseline Status post left-sided stent placement with a drain ,Urology is recommending outpatient follow-up after discharge continue IV fluids,   cx - Klebsiella sensitive to Zosyn, Cipro and Rocephin, blood cultures with klebsiella. changed the antibiotic to Rocephin .we will change antibiotics ciprofloxacin at the time of discharge    #2. Lactic acidosis,  follow lactic acid level 4.0-1.2    Continue hydration, antibiotic therapy  #3. Acute renal failure due to infection and obstructive stone, with hydronephrosis Baseline creatinine at 1.7 Creatinine 3.06-2.67 --2.41 Status post left-sided nephrostomy placement 10/16 2017 by interventional radiology urology's recommendation to continue IV fluids and antibiotics and outpatient follow-up  Discharge patient home with Foley catheter and nephrostomy tube on the left side  Avoid nephrotoxins  Appreciate nephrology recommendations, outpatient follow-up with nephrology and urology as recommended  #4. Left lung atelectasis Incentive spirometry    PT consult-hh PT  All the records are reviewed and case discussed with Care Management/Social Workerr. Management plans discussed with the patient, daughter and grandson at bedside  and they are in agreement. daughter with several questions on questions were answered to her satisfaction  CODE STATUS: fc  TOTAL TIME TAKING CARE OF THIS PATIENT: 45 minutes.   POSSIBLE D/C IN 2-3 DAYS, DEPENDING ON CLINICAL CONDITION.  Note: This dictation was prepared with Dragon dictation along with smaller phrase technology. Any transcriptional errors that result from this process are unintentional.   Ramonita LabGouru, Eliannah Hinde M.D on 04/15/2016 at 1:28 PM  Between 7am to 6pm - Pager - [phone] After 6pm go to [email or web address] - password [email or web address] [facility]  [facility]  Office  [phone]  CC: Primary care physician; Lauro RegulusANDERSON,MARSHALL W., MD

## 2016-04-16 LAB — CBC
HCT: 29.1 % — ABNORMAL LOW (ref 40.0–52.0)
HEMOGLOBIN: 9.7 g/dL — AB (ref 13.0–18.0)
MCH: 27.1 pg (ref 26.0–34.0)
MCHC: 33.4 g/dL (ref 32.0–36.0)
MCV: 80.9 fL (ref 80.0–100.0)
PLATELETS: 151 10*3/uL (ref 150–440)
RBC: 3.6 MIL/uL — AB (ref 4.40–5.90)
RDW: 18 % — ABNORMAL HIGH (ref 11.5–14.5)
WBC: 11 10*3/uL — ABNORMAL HIGH (ref 3.8–10.6)

## 2016-04-16 LAB — BASIC METABOLIC PANEL
Anion gap: 5 (ref 5–15)
BUN: 37 mg/dL — ABNORMAL HIGH (ref 6–20)
CHLORIDE: 110 mmol/L (ref 101–111)
CO2: 18 mmol/L — AB (ref 22–32)
CREATININE: 2.41 mg/dL — AB (ref 0.61–1.24)
Calcium: 7.6 mg/dL — ABNORMAL LOW (ref 8.9–10.3)
GFR calc non Af Amer: 23 mL/min — ABNORMAL LOW (ref >60)
GFR, EST AFRICAN AMERICAN: 26 mL/min — AB (ref >60)
GLUCOSE: 206 mg/dL — AB (ref 65–99)
Potassium: 3.8 mmol/L (ref 3.5–5.1)
Sodium: 133 mmol/L — ABNORMAL LOW (ref 135–145)

## 2016-04-16 LAB — GLUCOSE, CAPILLARY
Glucose-Capillary: 114 mg/dL — ABNORMAL HIGH (ref 65–99)
Glucose-Capillary: 156 mg/dL — ABNORMAL HIGH (ref 65–99)

## 2016-04-16 MED ORDER — INSULIN ASPART 100 UNIT/ML ~~LOC~~ SOLN: 3.0000 [IU] | Freq: Three times a day (TID) | SUBCUTANEOUS | 2 refills | Status: DC

## 2016-04-16 MED ORDER — ACETAMINOPHEN 325 MG PO TABS: 650.0000 mg | ORAL_TABLET | Freq: Four times a day (QID) | ORAL | Status: DC | PRN

## 2016-04-16 MED ORDER — OXYCODONE-ACETAMINOPHEN 5-325 MG PO TABS: 1.0000 | ORAL_TABLET | ORAL | 0 refills | Status: DC | PRN

## 2016-04-16 MED ORDER — CIPROFLOXACIN HCL 500 MG PO TABS: 500.0000 mg | ORAL_TABLET | Freq: Two times a day (BID) | ORAL | 0 refills | Status: AC

## 2016-04-16 NOTE — Discharge Summary (Signed)
[facility] Physicians - [place] at [facility]   PATIENT NAME: [name]    MR#:  [id number]  DATE OF BIRTH:  [birth year]  DATE OF ADMISSION:  04/12/2016 ADMITTING PHYSICIAN: Katharina Caper, MD  DATE OF DISCHARGE: 04/16/16  PRIMARY CARE PHYSICIAN: Lauro Regulus., MD    ADMISSION DIAGNOSIS:  Hydronephrosis [N13.30] Ureterolithiasis [N20.1] Left flank pain [R10.9] Severe sepsis (HCC) [A41.9, R65.20] Acute renal failure, unspecified acute renal failure type (HCC) [N17.9] Urinary tract infection associated with indwelling urethral catheter, initial encounter (HCC) [W09.811B, N39.0] Community acquired pneumonia of left lower lobe of lung (HCC) [J18.1]  DISCHARGE DIAGNOSIS:  Active Problems:   Severe sepsis (HCC)   Lactic acidosis   Acute renal failure (HCC)   Ureteral stone with hydronephrosis   Leukocytosis   Acute pyelonephritis   Atelectasis of left lung   Elevated troponin   SECONDARY DIAGNOSIS:   Past Medical History:  Diagnosis Date  . BPH (benign prostatic hypertrophy)   . Coronary artery disease   . Diverticulosis   . Essential hypertension, benign   . GI bleed 2015  . Hypersomnia with sleep apnea, unspecified   . MI (myocardial infarction)   . Other and unspecified hyperlipidemia   . Pure hypercholesterolemia   . Type II or unspecified type diabetes mellitus without mention of complication, uncontrolled     HOSPITAL COURSE:   #1. Sepsis due to acute pyelonephritis/obstructive stone-leading to metabolic encephalopathy Encephalopathy resolved mentation at  baseline Status post left-sided stent placement with a drain ,Urology is recommending outpatient follow-up after discharge with drain and foley  s/p IV fluids,  cx - Klebsiella sensitive to Zosyn, Cipro and Rocephin, blood cultures with klebsiella. changed the antibiotic to Rocephin .we will change antibiotics ciprofloxacin at the time of discharge    #2. Lactic acidosis,   follow lactic acid level 4.0-1.2    Continue hydration, antibiotic therapy  #3. Acute renal failure due to infection and obstructive stone, with hydronephrosis Baseline creatinine at 1.7 Creatinine 3.06-2.67 --2.41.ok to Discharge patient from nephrology and urology standpoint, Status post left-sided nephrostomy placement 10/16 2017 by interventional radiology urology's recommendation to continue IV fluids and antibiotics and outpatient follow-up  Discharge patient home with Foley catheter and nephrostomy tube on the left side  Avoid nephrotoxins  Appreciate nephrology recommendations, outpatient follow-up with nephrology and urology as recommended  #4. Left lung atelectasis Incentive spirometry    PT consult-hh PT  DISCHARGE CONDITIONS:   fair  CONSULTS OBTAINED:  Treatment Team:  Mady Haagensen, MD Mick Sell, MD   PROCEDURES Status post left-sided stent placement with a drainIn addition  DRUG ALLERGIES:   Allergies  Allergen Reactions  . Exforge [Amlodipine Besylate-Valsartan] Swelling and Other (See Comments)    Pt states that he is unable to swallow.    . Lisinopril Swelling and Other (See Comments)    Pt states that he is unable to swallow.    . Niacin And Related Swelling and Other (See Comments)    Pt states that he is unable to swallow.      DISCHARGE MEDICATIONS:   Current Discharge Medication List    START taking these medications   Details  acetaminophen (TYLENOL) 325 MG tablet Take 2 tablets (650 mg total) by mouth every 6 (six) hours as needed for mild pain (or Fever >/= 101).    ciprofloxacin (CIPRO) 500 MG tablet Take 1 tablet (500 mg total) by mouth 2 (two) times daily. Qty: 28 tablet, Refills: 0    insulin aspart (  NOVOLOG) 100 UNIT/ML injection Inject 3 Units into the skin 3 (three) times daily with meals. Qty: 10 mL, Refills: 2      CONTINUE these medications which have CHANGED   Details  oxyCODONE-acetaminophen (PERCOCET/ROXICET)  5-325 MG tablet Take 1 tablet by mouth every 4 (four) hours as needed for severe pain. Reported on 01/09/2016 Qty: 30 tablet, Refills: 0      CONTINUE these medications which have NOT CHANGED   Details  Coenzyme Q10 (CO Q 10) 10 MG CAPS Take by mouth.    colchicine-probenecid 0.5-500 MG tablet Take 1 tablet by mouth daily. Refills: 1    finasteride (PROSCAR) 5 MG tablet Take 1 tablet (5 mg total) by mouth daily. Qty: 90 tablet, Refills: 3    furosemide (LASIX) 40 MG tablet Take 2 tablets every am & 1 tablet every pm.    glimepiride (AMARYL) 1 MG tablet Take 1 mg by mouth daily with breakfast.     Insulin Glargine (LANTUS SOLOSTAR) 100 UNIT/ML Solostar Pen Inject 12 Units into the skin daily.     losartan (COZAAR) 100 MG tablet Take 100 mg by mouth daily. Refills: 0    MAGNESIUM PO Take by mouth.    metolazone (ZAROXOLYN) 5 MG tablet Take 5 mg by mouth as needed. Reported on 12/15/2015    potassium chloride (KLOR-CON) 20 MEQ packet Take 20 mEq by mouth daily.    tamsulosin (FLOMAX) 0.4 MG CAPS capsule Take 1 capsule (0.4 mg total) by mouth daily. Qty: 90 capsule, Refills: 3   Associated Diagnoses: Benign prostatic hyperplasia with urinary obstruction; Urinary retention      STOP taking these medications     sitaGLIPtin-metformin (JANUMET) 50-1000 MG tablet          DISCHARGE INSTRUCTIONS:  Activity as tolerated per PT recommendations Diet diabetic Follow-up with primary care physician in 5 days Follow-up with urology in 1-2 weeks with Foley catheter and left sided drain Follow-up with nephrology in a week    DIET:  Diabetic diet  DISCHARGE CONDITION:  Fair  ACTIVITY:  Activity as tolerated  OXYGEN:  Home Oxygen: No.   Oxygen Delivery: room air  DISCHARGE LOCATION:  home   If you experience worsening of your admission symptoms, develop shortness of breath, life threatening emergency, suicidal or homicidal thoughts you must seek medical attention  immediately by calling 911 or calling your MD immediately  if symptoms less severe.  You Must read complete instructions/literature along with all the possible adverse reactions/side effects for all the Medicines you take and that have been prescribed to you. Take any new Medicines after you have completely understood and accpet all the possible adverse reactions/side effects.   Please note  You were cared for by a hospitalist during your hospital stay. If you have any questions about your discharge medications or the care you received while you were in the hospital after you are discharged, you can call the unit and asked to speak with the hospitalist on call if the hospitalist that took care of you is not available. Once you are discharged, your primary care physician will handle any further medical issues. Please note that NO REFILLS for any discharge medications will be authorized once you are discharged, as it is imperative that you return to your primary care physician (or establish a relationship with a primary care physician if you do not have one) for your aftercare needs so that they can reassess your need for medications and monitor your lab values.  Today  Chief Complaint  Patient presents with  . Abdominal Pain  . Extremity Weakness  . Altered Mental Status   Patient is doing fine. Last bowel movement yesterday. Denies any new complaints. Feels comfortable to go home  ROS:  CONSTITUTIONAL: Denies fevers, chills. Denies any fatigue, weakness.  EYES: Denies blurry vision, double vision, eye pain. EARS, NOSE, THROAT: Denies tinnitus, ear pain, hearing loss. RESPIRATORY: Denies cough, wheeze, shortness of breath.  CARDIOVASCULAR: Denies chest pain, palpitations, edema.  GASTROINTESTINAL: Denies nausea, vomiting, diarrhea, abdominal pain. Denies bright red blood per rectum. GENITOURINARY: Denies dysuria, hematuria. ENDOCRINE: Denies nocturia or thyroid problems. HEMATOLOGIC  AND LYMPHATIC: Denies easy bruising or bleeding. SKIN: Denies rash or lesion. MUSCULOSKELETAL: Denies pain in neck, back, shoulder, knees, hips or arthritic symptoms.  NEUROLOGIC: Denies paralysis, paresthesias.  PSYCHIATRIC: Denies anxiety or depressive symptoms.   VITAL SIGNS:  Blood pressure (!) 141/68, pulse 73, temperature 97.9 F (36.6 C), resp. rate 18, height 5\' 5"  (1.651 m), weight 81.6 kg (180 lb), SpO2 97 %.  I/O:    Intake/Output Summary (Last 24 hours) at 04/16/16 1409 Last data filed at 04/16/16 1056  Gross per 24 hour  Intake             4058 ml  Output             1740 ml  Net             2318 ml    PHYSICAL EXAMINATION:  GENERAL:  82 y.o.-year-old patient lying in the bed with no acute distress.  EYES: Pupils equal, round, reactive to light and accommodation. No scleral icterus. Extraocular muscles intact.  HEENT: Head atraumatic, normocephalic. Oropharynx and nasopharynx clear.  NECK:  Supple, no jugular venous distention. No thyroid enlargement, no tenderness.  LUNGS: Normal breath sounds bilaterally, no wheezing, rales,rhonchi or crepitation. No use of accessory muscles of respiration.  CARDIOVASCULAR: S1, S2 normal. No murmurs, rubs, or gallops.  ABDOMEN: Soft, non-tender, non-distended. Bowel sounds present. No organomegaly or mass. Intact fully catheter and left-sided drain in the flank area  EXTREMITIES: No pedal edema, cyanosis, or clubbing.  NEUROLOGIC: Cranial nerves II through XII are intact. Muscle strength 5/5 in all extremities. Sensation intact. Gait not checked.  PSYCHIATRIC: The patient is alert and oriented x 3.  SKIN: No obvious rash, lesion, or ulcer.   DATA REVIEW:   CBC  Recent Labs Lab 04/16/16 0954  WBC 11.0*  HGB 9.7*  HCT 29.1*  PLT 151    Chemistries   Recent Labs Lab 04/12/16 1123  04/16/16 0954  NA 131*  < > 133*  K 4.1  < > 3.8  CL 99*  < > 110  CO2 18*  < > 18*  GLUCOSE 209*  < > 206*  BUN 42*  < > 37*   CREATININE 3.01*  < > 2.41*  CALCIUM 8.6*  < > 7.6*  AST 26  --   --   ALT 12*  --   --   ALKPHOS 74  --   --   BILITOT 1.4*  --   --   < > = values in this interval not displayed.  Cardiac Enzymes  Recent Labs Lab 04/13/16 1010  TROPONINI 0.57*    Microbiology Results  Results for orders placed or performed during the hospital encounter of 04/12/16  Blood Culture (routine x 2)     Status: Abnormal   Collection Time: 04/12/16 11:23 AM  Result Value Ref Range Status  Specimen Description BLOOD RIGHT AC  Final   Special Requests   Final    BOTTLES DRAWN AEROBIC AND ANAEROBIC AER 4ML ANA 2ML   Culture  Setup Time   Final    GRAM NEGATIVE RODS AEROBIC BOTTLE ONLY CRITICAL VALUE NOTED.  VALUE IS CONSISTENT WITH PREVIOUSLY REPORTED AND CALLED VALUE.    Culture (A)  Final    KLEBSIELLA PNEUMONIAE SUSCEPTIBILITIES PERFORMED ON PREVIOUS CULTURE WITHIN THE LAST 5 DAYS. Performed [facility]    Report Status 04/15/2016 FINAL  Final  Blood Culture (routine x 2)     Status: Abnormal   Collection Time: 04/12/16 11:23 AM  Result Value Ref Range Status   Specimen Description BLOOD LEFT AC  Final   Special Requests   Final    BOTTLES DRAWN AEROBIC AND ANAEROBIC AER 5ML ANA 3ML   Culture  Setup Time   Final    GRAM NEGATIVE RODS IN BOTH AEROBIC AND ANAEROBIC BOTTLES CRITICAL RESULT CALLED TO, READ BACK BY AND VERIFIED WITH: MATT MCBANE AT 0327 ON 04/13/16 RWW CONFIRMED BY TLB    Culture KLEBSIELLA PNEUMONIAE (A)  Final   Report Status 04/15/2016 FINAL  Final   Organism ID, Bacteria KLEBSIELLA PNEUMONIAE  Final      Susceptibility   Klebsiella pneumoniae - MIC*    AMPICILLIN >=32 RESISTANT Resistant     CEFAZOLIN <=4 SENSITIVE Sensitive     CEFEPIME <=1 SENSITIVE Sensitive     CEFTAZIDIME <=1 SENSITIVE Sensitive     CEFTRIAXONE <=1 SENSITIVE Sensitive     CIPROFLOXACIN <=0.25 SENSITIVE Sensitive     GENTAMICIN <=1 SENSITIVE Sensitive     IMIPENEM <=0.25 SENSITIVE  Sensitive     TRIMETH/SULFA <=20 SENSITIVE Sensitive     AMPICILLIN/SULBACTAM <=2 SENSITIVE Sensitive     PIP/TAZO <=4 SENSITIVE Sensitive     Extended ESBL NEGATIVE Sensitive     * KLEBSIELLA PNEUMONIAE  Urine culture     Status: Abnormal   Collection Time: 04/12/16 11:23 AM  Result Value Ref Range Status   Specimen Description URINE, RANDOM  Final   Special Requests NONE  Final   Culture >=100,000 COLONIES/mL KLEBSIELLA PNEUMONIAE (A)  Final   Report Status 04/14/2016 FINAL  Final   Organism ID, Bacteria KLEBSIELLA PNEUMONIAE (A)  Final      Susceptibility   Klebsiella pneumoniae - MIC*    AMPICILLIN >=32 RESISTANT Resistant     CEFAZOLIN <=4 SENSITIVE Sensitive     CEFTRIAXONE <=1 SENSITIVE Sensitive     CIPROFLOXACIN <=0.25 SENSITIVE Sensitive     GENTAMICIN <=1 SENSITIVE Sensitive     IMIPENEM <=0.25 SENSITIVE Sensitive     NITROFURANTOIN 64 INTERMEDIATE Intermediate     TRIMETH/SULFA <=20 SENSITIVE Sensitive     AMPICILLIN/SULBACTAM 4 SENSITIVE Sensitive     PIP/TAZO <=4 SENSITIVE Sensitive     Extended ESBL NEGATIVE Sensitive     * >=100,000 COLONIES/mL KLEBSIELLA PNEUMONIAE  Blood Culture ID Panel (Reflexed)     Status: Abnormal   Collection Time: 04/12/16 11:23 AM  Result Value Ref Range Status   Enterococcus species NOT DETECTED NOT DETECTED Final   Listeria monocytogenes NOT DETECTED NOT DETECTED Final   Staphylococcus species NOT DETECTED NOT DETECTED Final   Staphylococcus aureus NOT DETECTED NOT DETECTED Final   Streptococcus species NOT DETECTED NOT DETECTED Final   Streptococcus agalactiae NOT DETECTED NOT DETECTED Final   Streptococcus pneumoniae NOT DETECTED NOT DETECTED Final   Streptococcus pyogenes NOT DETECTED NOT DETECTED Final  Acinetobacter baumannii NOT DETECTED NOT DETECTED Final   Enterobacteriaceae species DETECTED (A) NOT DETECTED Final    Comment: CRITICAL RESULT CALLED TO, READ BACK BY AND VERIFIED WITH: MATT MCBANE AT 0327 ON 04/13/16 RWW     Enterobacter cloacae complex NOT DETECTED NOT DETECTED Final   Escherichia coli NOT DETECTED NOT DETECTED Final   Klebsiella oxytoca NOT DETECTED NOT DETECTED Final   Klebsiella pneumoniae DETECTED (A) NOT DETECTED Final    Comment: CRITICAL RESULT CALLED TO, READ BACK BY AND VERIFIED WITH: MATT MCBANE AT 0327 ON 04/13/16 RWW    Proteus species NOT DETECTED NOT DETECTED Final   Serratia marcescens NOT DETECTED NOT DETECTED Final   Carbapenem resistance NOT DETECTED NOT DETECTED Final   Haemophilus influenzae NOT DETECTED NOT DETECTED Final   Neisseria meningitidis NOT DETECTED NOT DETECTED Final   Pseudomonas aeruginosa NOT DETECTED NOT DETECTED Final   Candida albicans NOT DETECTED NOT DETECTED Final   Candida glabrata NOT DETECTED NOT DETECTED Final   Candida krusei NOT DETECTED NOT DETECTED Final   Candida parapsilosis NOT DETECTED NOT DETECTED Final   Candida tropicalis NOT DETECTED NOT DETECTED Final  Aerobic/Anaerobic Culture (surgical/deep wound)     Status: None (Preliminary result)   Collection Time: 04/12/16  4:56 PM  Result Value Ref Range Status   Specimen Description KIDNEY LEFT  Final   Special Requests Normal  Final   Gram Stain   Final    ABUNDANT WBC PRESENT,BOTH PMN AND MONONUCLEAR NO ORGANISMS SEEN    Culture   Final    ABUNDANT KLEBSIELLA PNEUMONIAE CRITICAL RESULT CALLED TO, READ BACK BY AND VERIFIED WITH: M. FLOWERS RN, AT 1114 04/13/16 BY D. VANHOOK NO ANAEROBES ISOLATED; CULTURE IN PROGRESS FOR 5 DAYS    Report Status PENDING  Incomplete   Organism ID, Bacteria KLEBSIELLA PNEUMONIAE  Final      Susceptibility   Klebsiella pneumoniae - MIC*    AMPICILLIN >=32 RESISTANT Resistant     CEFAZOLIN <=4 SENSITIVE Sensitive     CEFEPIME <=1 SENSITIVE Sensitive     CEFTAZIDIME <=1 SENSITIVE Sensitive     CEFTRIAXONE <=1 SENSITIVE Sensitive     CIPROFLOXACIN <=0.25 SENSITIVE Sensitive     GENTAMICIN <=1 SENSITIVE Sensitive     IMIPENEM <=0.25 SENSITIVE  Sensitive     TRIMETH/SULFA <=20 SENSITIVE Sensitive     AMPICILLIN/SULBACTAM 4 SENSITIVE Sensitive     PIP/TAZO <=4 SENSITIVE Sensitive     Extended ESBL Value in next row Sensitive      NEGATIVEPerformed at [facility]    * ABUNDANT KLEBSIELLA PNEUMONIAE  MRSA PCR Screening     Status: None   Collection Time: 04/12/16  6:19 PM  Result Value Ref Range Status   MRSA by PCR NEGATIVE NEGATIVE Final    Comment:        The GeneXpert MRSA Assay (FDA approved for NASAL specimens only), is one component of a comprehensive MRSA colonization surveillance program. It is not intended to diagnose MRSA infection nor to guide or monitor treatment for MRSA infections.     RADIOLOGY:  [place] Intraoperative  Result Date: 04/12/2016 CLINICAL DATA:  Ultrasound was provided for use by the ordering physician, and a technical charge was applied by the performing facility.  No radiologist interpretation/professional services rendered.   Ir Nephrostomy Placement Left  Result Date: 04/13/2016 INDICATION: Obstructing left-sided ureteral stone with clinical symptoms worrisome for impending urosepsis. Request made for placement of an ultrasound fluoroscopic guided left-sided percutaneous nephrostomy catheter. EXAM:  1. ULTRASOUND GUIDANCE FOR PUNCTURE OF THE LEFT RENAL COLLECTING SYSTEM 2. LEFT PERCUTANEOUS NEPHROSTOMY TUBE PLACEMENT. COMPARISON:  CT the abdomen pelvis - 04/12/2016 MEDICATIONS: The patient is currently admitted to the hospital and receiving intravenous antibiotics; The antibiotic was administered in an appropriate time frame prior to skin puncture. ANESTHESIA/SEDATION: Moderate (conscious) sedation was employed during this procedure. A total of Fentanyl 75 mcg was administered intravenously. Moderate Sedation Time: 20 minutes. The patient's level of consciousness and vital signs were monitored continuously by radiology nursing throughout the procedure under my direct supervision. CONTRAST:   15 mL Isovue 300 administered into the collecting system FLUOROSCOPY TIME:  1 minute (40 mGy) COMPLICATIONS: None immediate. PROCEDURE: The procedure, risks, benefits, and alternatives were explained to the patient. Questions regarding the procedure were encouraged and answered. The patient understands and consents to the procedure. A timeout was performed prior to the initiation of the procedure. The left flank region was prepped with Betadine in a sterile fashion, and a sterile drape was applied covering the operative field. A sterile gown and sterile gloves were used for the procedure. Local anesthesia was provided with 1% Lidocaine with epinephrine. Ultrasound was used to localize the left kidney. Under direct ultrasound guidance, a 21 gauge needle was advanced into the renal collecting system. An ultrasound image documentation was performed. Access within the collecting system was confirmed with the efflux of urine followed by contrast injection. Over an 0.018 wire, the tract was dilated with an Accustick stent. Over a guide wire, a 10-French percutaneous nephrostomy catheter was advanced into the collecting system where the coil was formed and locked. Contrast was injected and several sport radiographs were obtained in various obliquities confirming access. The catheter was secured at the skin with a Prolene retention suture and a gravity bag was placed. A dressing was placed. The patient tolerated procedure well without immediate postprocedural complication. FINDINGS: Ultrasound scanning demonstrates a mild to moderately dilated left renal collecting system. Under direct ultrasound guidance, a posterior inferior calix was targeted allowing advancement of an 10-French percutaneous nephrostomy catheter under intermittent fluoroscopic guidance. Following percutaneous nephrostomy catheter, purulent appearing, foul smelling urine was aspirated from the collecting system. A aspirated urine sample was capped and  sent to the laboratory for analysis. Contrast injection confirmed appropriate positioning. IMPRESSION: Successful ultrasound and fluoroscopic guided placement of a left sided 10 French PCN yielding purulent urine. A sample of aspirated urine was capped and sent to the laboratory for analysis. Electronically Signed   By: [name] M.D.   On: 04/13/2016 08:17    EKG:   Orders placed or performed during the hospital encounter of 04/12/16  . EKG 12-Lead  . EKG 12-Lead  . EKG 12-Lead  . EKG 12-Lead      Management plans discussed with the patient, family and they are in agreement.  CODE STATUS:     Code Status Orders        Start     Ordered   04/12/16 1703  Full code  Continuous     04/12/16 1702    Code Status History    Date Active Date Inactive Code Status Order ID Comments User Context   11/22/2015  6:10 PM 11/25/2015  2:33 PM Full Code [id number]  Enedina Finner, MD Inpatient   02/21/2015  8:30 PM 02/23/2015  9:59 PM Full Code [id number]  Houston Siren, MD Inpatient    Advance Directive Documentation   Flowsheet Row Most Recent Value  Type of Advance Directive  Living  will  Pre-existing out of facility DNR order (yellow form or pink MOST form)  No data  "MOST" Form in Place?  No data      TOTAL TIME TAKING CARE OF THIS PATIENT: 45 minutes.   Note: This dictation was prepared with Dragon dictation along with smaller phrase technology. Any transcriptional errors that result from this process are unintentional.   @MEC @  on 04/16/2016 at 2:09 PM  Between 7am to 6pm - Pager - [phone]  After 6pm go to [email or web address] - password [email or web address] [facility]  [place] [facility]  Office  [phone]  CC: Primary care physician; Lauro Regulus., MD

## 2016-04-16 NOTE — Care Management (Signed)
Patient to discharge home today.  [facility] was selected for home health agency. I have notified Barbara Cower of referral for PT, RN, and aide.  RW was delivered to room.  RNCM signing off

## 2016-04-16 NOTE — Discharge Instructions (Signed)
Activity as tolerated per PT recommendations Diet diabetic Follow-up with primary care physician in 5 days Follow-up with urology in 1-2 weeks with Foley catheter and left sided drain Follow-up with nephrology in a week

## 2016-04-16 NOTE — Progress Notes (Signed)
Patient has been up and ambulating to the bathroom.  Voiding well with no complaints of pain.  [name] N 04/16/2016  5:20 AM

## 2016-04-16 NOTE — Progress Notes (Signed)
Physical Therapy Treatment Patient Details Name: [name] MRN: [id number] DOB: 01/27/1928 Today's Date: 04/16/2016    History of Present Illness Pt. is a 82 y.o. male presenting with confusion, weakness, and abdominal pain. [facility] wtih sepsis secondary to pyelonephritis, L nephrostomy tube placed currently in addition to chronic foley. Pt. elevated troponin levels attributed to demand ischemia.    PT Comments    Able to initiate stair training this session; requires bilat UE support to safely complete.  Encouraged bilat UEs on single rail with step to gait pattern for optimal safety/indep.  Patient voiced understanding; able to return demonstration of correct technique. Denies additional questions/concerns regarding upcoming discharge and home accessibility.   Follow Up Recommendations  Home health PT     Equipment Recommendations  Rolling walker with 5" wheels    Recommendations for Other Services       Precautions / Restrictions Precautions Precautions: Fall Precaution Comments: L nephrostomy tube, chronic foley Restrictions Weight Bearing Restrictions: No    Mobility  Bed Mobility Overal bed mobility: Needs Assistance Bed Mobility: Supine to Sit     Supine to sit: Min assist     General bed mobility comments: assist for truncal elevation  Transfers Overall transfer level: Needs assistance Equipment used: Rolling walker (2 wheeled) Transfers: Sit to/from Stand Sit to Stand: Supervision         General transfer comment: tends to pull up on RW  Ambulation/Gait Ambulation/Gait assistance: Supervision Ambulation Distance (Feet): 180 Feet Assistive device: Rolling walker (2 wheeled)       General Gait Details: forward flexed posture, min cuing for walker position and postural extension.  Broad BOS.   Stairs Stairs: Yes Stairs assistance: Min guard Stair Management:  (bilat UEs on single rail) Number of Stairs: 4 General stair comments:  step to gait pattern without overt buckling/LOB; does require bilat UE support  Wheelchair Mobility    Modified Rankin (Stroke Patients Only)       Balance Overall balance assessment: Needs assistance Sitting-balance support: No upper extremity supported;Feet supported Sitting balance-Leahy Scale: Good     Standing balance support: Bilateral upper extremity supported Standing balance-Leahy Scale: Fair                      Cognition Arousal/Alertness: Awake/alert Behavior During Therapy: WFL for tasks assessed/performed Overall Cognitive Status: Within Functional Limits for tasks assessed                      Exercises      General Comments        Pertinent Vitals/Pain Pain Assessment: No/denies pain    Home Living                      Prior Function            PT Goals (current goals can now be found in the care plan section) Acute Rehab PT Goals Patient Stated Goal: Pt. would like to regain his strength PT Goal Formulation: With patient Time For Goal Achievement: 04/27/16 Potential to Achieve Goals: Good Progress towards PT goals: Progressing toward goals    Frequency    Min 2X/week      PT Plan Current plan remains appropriate    Co-evaluation             End of Session Equipment Utilized During Treatment: Gait belt Activity Tolerance: Patient tolerated treatment well Patient left: in chair;with call bell/phone within reach (  alarm pad not available, and patient with pending discharge; RN informed/aware of patient position and alarm status)     Time: 1207-1224 PT Time Calculation (min) (ACUTE ONLY): 17 min  Charges:  $Gait Training: 8-22 mins                    G Codes:      Vonda Harth H. Manson Passey, PT, DPT, NCS 04/16/16, 2:34 PM [phone]

## 2016-04-16 NOTE — Discharge Summary (Signed)
[facility] - [place] at [facility]   PATIENT NAME: [name]    MR#:  [id number]  DATE OF BIRTH:  [birth year]  DATE OF ADMISSION:  04/12/2016 ADMITTING PHYSICIAN: Katharina Caper, MD  DATE OF DISCHARGE: 04/16/16 PRIMARY CARE PHYSICIAN: Lauro Regulus., MD    ADMISSION DIAGNOSIS:  Hydronephrosis [N13.30] Ureterolithiasis [N20.1] Left flank pain [R10.9] Severe sepsis (HCC) [A41.9, R65.20] Acute renal failure, unspecified acute renal failure type (HCC) [N17.9] Urinary tract infection associated with indwelling urethral catheter, initial encounter (HCC) [I14.431V, N39.0] Community acquired pneumonia of left lower lobe of lung (HCC) [J18.1]  DISCHARGE DIAGNOSIS:  Active Problems:   Severe sepsis (HCC)   Lactic acidosis   Acute renal failure (HCC)   Ureteral stone with hydronephrosis   Leukocytosis   Acute pyelonephritis   Atelectasis of left lung   Elevated troponin   SECONDARY DIAGNOSIS:   Past Medical History:  Diagnosis Date  . BPH (benign prostatic hypertrophy)   . Coronary artery disease   . Diverticulosis   . Essential hypertension, benign   . GI bleed 2015  . Hypersomnia with sleep apnea, unspecified   . MI (myocardial infarction)   . Other and unspecified hyperlipidemia   . Pure hypercholesterolemia   . Type II or unspecified type diabetes mellitus without mention of complication, uncontrolled     HOSPITAL COURSE:  1. Sepsis due to acute pyelonephritis/obstructive stone-leading to metabolic encephalopathy Encephalopathy resolved mentation close to baseline Status post left-sided stent placement with a drain ,Urology is recommending outpatient follow-up after discharge continue IV fluids,  cx - Klebsiella sensitive to Zosyn, Cipro and Rocephin, blood cultures with klebsiella. changed the antibiotic to Rocephin .we will change antibiotics ciprofloxacin at the time of discharge    #2. Lactic acidosis,  follow lactic acid  level 4.0-1.2    Continue hydration, antibiotic therapy  #3. Acute renal failure due to infection and obstructive stone, with hydronephrosis Baseline creatinine at 1.7 Creatinine 3.06-2.67  Status post left-sided nephrostomy placement 10/16 2017 by interventional radiology urology's recommendation to continue IV fluids and antibiotics and outpatient follow-up  Discharge patient home with Foley catheter and nephrostomy tube on the left side  Avoid nephrotoxins  Appreciate nephrology recommendations might consider temporary dialysis if no improvement. Family is agreeable baseline GFR 38 currently at 12   #4. Left lung atelectasis Incentive spirometry   on broad-spectrum IV antibiotics Sputum cultures are pending  PT consult- HH PT/RN  DISCHARGE CONDITIONS:   fair  CONSULTS OBTAINED:  Treatment Team:  Mady Haagensen, MD Mick Sell, MD   PROCEDURES nephrostomy tube on the left side   DRUG ALLERGIES:   Allergies  Allergen Reactions  . Exforge [Amlodipine Besylate-Valsartan] Swelling and Other (See Comments)    Pt states that he is unable to swallow.    . Lisinopril Swelling and Other (See Comments)    Pt states that he is unable to swallow.    . Niacin And Related Swelling and Other (See Comments)    Pt states that he is unable to swallow.      DISCHARGE MEDICATIONS:   Current Discharge Medication List    START taking these medications   Details  acetaminophen (TYLENOL) 325 MG tablet Take 2 tablets (650 mg total) by mouth every 6 (six) hours as needed for mild pain (or Fever >/= 101).    ciprofloxacin (CIPRO) 500 MG tablet Take 1 tablet (500 mg total) by mouth 2 (two) times daily. Qty: 28 tablet, Refills: 0    insulin  aspart (NOVOLOG) 100 UNIT/ML injection Inject 3 Units into the skin 3 (three) times daily with meals. Qty: 10 mL, Refills: 2      CONTINUE these medications which have CHANGED   Details  oxyCODONE-acetaminophen (PERCOCET/ROXICET) 5-325  MG tablet Take 1 tablet by mouth every 4 (four) hours as needed for severe pain. Reported on 01/09/2016 Qty: 30 tablet, Refills: 0      CONTINUE these medications which have NOT CHANGED   Details  Coenzyme Q10 (CO Q 10) 10 MG CAPS Take by mouth.    colchicine-probenecid 0.5-500 MG tablet Take 1 tablet by mouth daily. Refills: 1    finasteride (PROSCAR) 5 MG tablet Take 1 tablet (5 mg total) by mouth daily. Qty: 90 tablet, Refills: 3    furosemide (LASIX) 40 MG tablet Take 2 tablets every am & 1 tablet every pm.    glimepiride (AMARYL) 1 MG tablet Take 1 mg by mouth daily with breakfast.     Insulin Glargine (LANTUS SOLOSTAR) 100 UNIT/ML Solostar Pen Inject 12 Units into the skin daily.     losartan (COZAAR) 100 MG tablet Take 100 mg by mouth daily. Refills: 0    MAGNESIUM PO Take by mouth.    metolazone (ZAROXOLYN) 5 MG tablet Take 5 mg by mouth as needed. Reported on 12/15/2015    potassium chloride (KLOR-CON) 20 MEQ packet Take 20 mEq by mouth daily.    tamsulosin (FLOMAX) 0.4 MG CAPS capsule Take 1 capsule (0.4 mg total) by mouth daily. Qty: 90 capsule, Refills: 3   Associated Diagnoses: Benign prostatic hyperplasia with urinary obstruction; Urinary retention      STOP taking these medications     sitaGLIPtin-metformin (JANUMET) 50-1000 MG tablet          DISCHARGE INSTRUCTIONS:   Activity as tolerated per PT recommendations Diet diabetic Follow-up with primary care physician in 5 days Follow-up with urology in 1-2 weeks with Foley catheter and left sided drain Follow-up with nephrology in a week   DIET:  Diabetic diet  DISCHARGE CONDITION:  Fair  ACTIVITY:  Activity as tolerated  OXYGEN:  Home Oxygen: No.   Oxygen Delivery: room air  DISCHARGE LOCATION:  home   If you experience worsening of your admission symptoms, develop shortness of breath, life threatening emergency, suicidal or homicidal thoughts you must seek medical attention immediately  by calling 911 or calling your MD immediately  if symptoms less severe.  You Must read complete instructions/literature along with all the possible adverse reactions/side effects for all the Medicines you take and that have been prescribed to you. Take any new Medicines after you have completely understood and accpet all the possible adverse reactions/side effects.   Please note  You were cared for by a hospitalist during your hospital stay. If you have any questions about your discharge medications or the care you received while you were in the hospital after you are discharged, you can call the unit and asked to speak with the hospitalist on call if the hospitalist that took care of you is not available. Once you are discharged, your primary care physician will handle any further medical issues. Please note that NO REFILLS for any discharge medications will be authorized once you are discharged, as it is imperative that you return to your primary care physician (or establish a relationship with a primary care physician if you do not have one) for your aftercare needs so that they can reassess your need for medications and monitor your lab values.  Today  Chief Complaint  Patient presents with  . Abdominal Pain  . Extremity Weakness  . Altered Mental Status   Patient is doing fine. Denies any complaints  ROS:  CONSTITUTIONAL: Denies fevers, chills. Denies any fatigue, weakness.  EYES: Denies blurry vision, double vision, eye pain. EARS, NOSE, THROAT: Denies tinnitus, ear pain, hearing loss. RESPIRATORY: Denies cough, wheeze, shortness of breath.  CARDIOVASCULAR: Denies chest pain, palpitations, edema.  GASTROINTESTINAL: Denies nausea, vomiting, diarrhea, abdominal pain. Denies bright red blood per rectum. GENITOURINARY: Denies dysuria, hematuria. ENDOCRINE: Denies nocturia or thyroid problems. HEMATOLOGIC AND LYMPHATIC: Denies easy bruising or bleeding. SKIN: Denies rash or  lesion. MUSCULOSKELETAL: Denies pain in neck, back, shoulder, knees, hips or arthritic symptoms.  NEUROLOGIC: Denies paralysis, paresthesias.  PSYCHIATRIC: Denies anxiety or depressive symptoms.   VITAL SIGNS:  Blood pressure (!) 141/68, pulse 73, temperature 97.9 F (36.6 C), resp. rate 18, height 5\' 5"  (1.651 m), weight 81.6 kg (180 lb), SpO2 97 %.  I/O:    Intake/Output Summary (Last 24 hours) at 04/16/16 1458 Last data filed at 04/16/16 1056  Gross per 24 hour  Intake             4058 ml  Output             1740 ml  Net             2318 ml    PHYSICAL EXAMINATION:  GENERAL:  82 y.o.-year-old patient lying in the bed with no acute distress.  EYES: Pupils equal, round, reactive to light and accommodation. No scleral icterus. Extraocular muscles intact.  HEENT: Head atraumatic, normocephalic. Oropharynx and nasopharynx clear.  NECK:  Supple, no jugular venous distention. No thyroid enlargement, no tenderness.  LUNGS: Normal breath sounds bilaterally, no wheezing, rales,rhonchi or crepitation. No use of accessory muscles of respiration.  CARDIOVASCULAR: S1, S2 normal. No murmurs, rubs, or gallops.  ABDOMEN: Soft, non-tender, non-distended. Bowel sounds present. No organomegaly or mass.Left flank drain is present. Foley intact  EXTREMITIES: No pedal edema, cyanosis, or clubbing.  NEUROLOGIC: Cranial nerves II through XII are intact. Muscle strength 5/5 in all extremities. Sensation intact. Gait not checked.  PSYCHIATRIC: The patient is alert and oriented x 3.  SKIN: No obvious rash, lesion, or ulcer.   DATA REVIEW:   CBC  Recent Labs Lab 04/16/16 0954  WBC 11.0*  HGB 9.7*  HCT 29.1*  PLT 151    Chemistries   Recent Labs Lab 04/12/16 1123  04/16/16 0954  NA 131*  < > 133*  K 4.1  < > 3.8  CL 99*  < > 110  CO2 18*  < > 18*  GLUCOSE 209*  < > 206*  BUN 42*  < > 37*  CREATININE 3.01*  < > 2.41*  CALCIUM 8.6*  < > 7.6*  AST 26  --   --   ALT 12*  --   --    ALKPHOS 74  --   --   BILITOT 1.4*  --   --   < > = values in this interval not displayed.  Cardiac Enzymes  Recent Labs Lab 04/13/16 1010  TROPONINI 0.57*    Microbiology Results  Results for orders placed or performed during the hospital encounter of 04/12/16  Blood Culture (routine x 2)     Status: Abnormal   Collection Time: 04/12/16 11:23 AM  Result Value Ref Range Status   Specimen Description BLOOD RIGHT [facility]  Final   Special Requests  Final    BOTTLES DRAWN AEROBIC AND ANAEROBIC AER 4ML ANA 2ML   Culture  Setup Time   Final    GRAM NEGATIVE RODS AEROBIC BOTTLE ONLY CRITICAL VALUE NOTED.  VALUE IS CONSISTENT WITH PREVIOUSLY REPORTED AND CALLED VALUE.    Culture (A)  Final    KLEBSIELLA PNEUMONIAE SUSCEPTIBILITIES PERFORMED ON PREVIOUS CULTURE WITHIN THE LAST 5 DAYS. [facility]    Report Status 04/15/2016 FINAL  Final  Blood Culture (routine x 2)     Status: Abnormal   Collection Time: 04/12/16 11:23 AM  Result Value Ref Range Status   Specimen Description BLOOD LEFT AC  Final   Special Requests   Final    BOTTLES DRAWN AEROBIC AND ANAEROBIC AER 5ML ANA 3ML   Culture  Setup Time   Final    GRAM NEGATIVE RODS IN BOTH AEROBIC AND ANAEROBIC BOTTLES CRITICAL RESULT CALLED TO, READ BACK BY AND VERIFIED WITH: MATT MCBANE AT 0327 ON 04/13/16 RWW CONFIRMED BY TLB    Culture KLEBSIELLA PNEUMONIAE (A)  Final   Report Status 04/15/2016 FINAL  Final   Organism ID, Bacteria KLEBSIELLA PNEUMONIAE  Final      Susceptibility   Klebsiella pneumoniae - MIC*    AMPICILLIN >=32 RESISTANT Resistant     CEFAZOLIN <=4 SENSITIVE Sensitive     CEFEPIME <=1 SENSITIVE Sensitive     CEFTAZIDIME <=1 SENSITIVE Sensitive     CEFTRIAXONE <=1 SENSITIVE Sensitive     CIPROFLOXACIN <=0.25 SENSITIVE Sensitive     GENTAMICIN <=1 SENSITIVE Sensitive     IMIPENEM <=0.25 SENSITIVE Sensitive     TRIMETH/SULFA <=20 SENSITIVE Sensitive     AMPICILLIN/SULBACTAM <=2 SENSITIVE  Sensitive     PIP/TAZO <=4 SENSITIVE Sensitive     Extended ESBL NEGATIVE Sensitive     * KLEBSIELLA PNEUMONIAE  Urine culture     Status: Abnormal   Collection Time: 04/12/16 11:23 AM  Result Value Ref Range Status   Specimen Description URINE, RANDOM  Final   Special Requests NONE  Final   Culture >=100,000 COLONIES/mL KLEBSIELLA PNEUMONIAE (A)  Final   Report Status 04/14/2016 FINAL  Final   Organism ID, Bacteria KLEBSIELLA PNEUMONIAE (A)  Final      Susceptibility   Klebsiella pneumoniae - MIC*    AMPICILLIN >=32 RESISTANT Resistant     CEFAZOLIN <=4 SENSITIVE Sensitive     CEFTRIAXONE <=1 SENSITIVE Sensitive     CIPROFLOXACIN <=0.25 SENSITIVE Sensitive     GENTAMICIN <=1 SENSITIVE Sensitive     IMIPENEM <=0.25 SENSITIVE Sensitive     NITROFURANTOIN 64 INTERMEDIATE Intermediate     TRIMETH/SULFA <=20 SENSITIVE Sensitive     AMPICILLIN/SULBACTAM 4 SENSITIVE Sensitive     PIP/TAZO <=4 SENSITIVE Sensitive     Extended ESBL NEGATIVE Sensitive     * >=100,000 COLONIES/mL KLEBSIELLA PNEUMONIAE  Blood Culture ID Panel (Reflexed)     Status: Abnormal   Collection Time: 04/12/16 11:23 AM  Result Value Ref Range Status   Enterococcus species NOT DETECTED NOT DETECTED Final   Listeria monocytogenes NOT DETECTED NOT DETECTED Final   Staphylococcus species NOT DETECTED NOT DETECTED Final   Staphylococcus aureus NOT DETECTED NOT DETECTED Final   Streptococcus species NOT DETECTED NOT DETECTED Final   Streptococcus agalactiae NOT DETECTED NOT DETECTED Final   Streptococcus pneumoniae NOT DETECTED NOT DETECTED Final   Streptococcus pyogenes NOT DETECTED NOT DETECTED Final   Acinetobacter baumannii NOT DETECTED NOT DETECTED Final   Enterobacteriaceae species DETECTED (A)  NOT DETECTED Final    Comment: CRITICAL RESULT CALLED TO, READ BACK BY AND VERIFIED WITH: MATT MCBANE AT 0327 ON 04/13/16 RWW    Enterobacter cloacae complex NOT DETECTED NOT DETECTED Final   Escherichia coli NOT  DETECTED NOT DETECTED Final   Klebsiella oxytoca NOT DETECTED NOT DETECTED Final   Klebsiella pneumoniae DETECTED (A) NOT DETECTED Final    Comment: CRITICAL RESULT CALLED TO, READ BACK BY AND VERIFIED WITH: MATT MCBANE AT 0327 ON 04/13/16 RWW    Proteus species NOT DETECTED NOT DETECTED Final   Serratia marcescens NOT DETECTED NOT DETECTED Final   Carbapenem resistance NOT DETECTED NOT DETECTED Final   Haemophilus influenzae NOT DETECTED NOT DETECTED Final   Neisseria meningitidis NOT DETECTED NOT DETECTED Final   Pseudomonas aeruginosa NOT DETECTED NOT DETECTED Final   Candida albicans NOT DETECTED NOT DETECTED Final   Candida glabrata NOT DETECTED NOT DETECTED Final   Candida krusei NOT DETECTED NOT DETECTED Final   Candida parapsilosis NOT DETECTED NOT DETECTED Final   Candida tropicalis NOT DETECTED NOT DETECTED Final  Aerobic/Anaerobic Culture (surgical/deep wound)     Status: None (Preliminary result)   Collection Time: 04/12/16  4:56 PM  Result Value Ref Range Status   Specimen Description KIDNEY LEFT  Final   Special Requests Normal  Final   Gram Stain   Final    ABUNDANT WBC PRESENT,BOTH PMN AND MONONUCLEAR NO ORGANISMS SEEN    Culture   Final    ABUNDANT KLEBSIELLA PNEUMONIAE CRITICAL RESULT CALLED TO, READ BACK BY AND VERIFIED WITH: M. FLOWERS RN, AT 1114 04/13/16 BY D. VANHOOK NO ANAEROBES ISOLATED; CULTURE IN PROGRESS FOR 5 DAYS    Report Status PENDING  Incomplete   Organism ID, Bacteria KLEBSIELLA PNEUMONIAE  Final      Susceptibility   Klebsiella pneumoniae - MIC*    AMPICILLIN >=32 RESISTANT Resistant     CEFAZOLIN <=4 SENSITIVE Sensitive     CEFEPIME <=1 SENSITIVE Sensitive     CEFTAZIDIME <=1 SENSITIVE Sensitive     CEFTRIAXONE <=1 SENSITIVE Sensitive     CIPROFLOXACIN <=0.25 SENSITIVE Sensitive     GENTAMICIN <=1 SENSITIVE Sensitive     IMIPENEM <=0.25 SENSITIVE Sensitive     TRIMETH/SULFA <=20 SENSITIVE Sensitive     AMPICILLIN/SULBACTAM 4 SENSITIVE  Sensitive     PIP/TAZO <=4 SENSITIVE Sensitive     Extended ESBL Value in next row Sensitive      [facility]    * ABUNDANT KLEBSIELLA PNEUMONIAE  MRSA PCR Screening     Status: None   Collection Time: 04/12/16  6:19 PM  Result Value Ref Range Status   MRSA by PCR NEGATIVE NEGATIVE Final    Comment:        The GeneXpert MRSA Assay (FDA approved for NASAL specimens only), is one component of a comprehensive MRSA colonization surveillance program. It is not intended to diagnose MRSA infection nor to guide or monitor treatment for MRSA infections.     RADIOLOGY:  [place] Intraoperative  Result Date: 04/12/2016 CLINICAL DATA:  Ultrasound was provided for use by the ordering physician, and a technical charge was applied by the performing facility.  No radiologist interpretation/professional services rendered.   Ir Nephrostomy Placement Left  Result Date: 04/13/2016 INDICATION: Obstructing left-sided ureteral stone with clinical symptoms worrisome for impending urosepsis. Request made for placement of an ultrasound fluoroscopic guided left-sided percutaneous nephrostomy catheter. EXAM: 1. ULTRASOUND GUIDANCE FOR PUNCTURE OF THE LEFT RENAL COLLECTING SYSTEM 2. LEFT  PERCUTANEOUS NEPHROSTOMY TUBE PLACEMENT. COMPARISON:  CT the abdomen pelvis - 04/12/2016 MEDICATIONS: The patient is currently admitted to the hospital and receiving intravenous antibiotics; The antibiotic was administered in an appropriate time frame prior to skin puncture. ANESTHESIA/SEDATION: Moderate (conscious) sedation was employed during this procedure. A total of Fentanyl 75 mcg was administered intravenously. Moderate Sedation Time: 20 minutes. The patient's level of consciousness and vital signs were monitored continuously by radiology nursing throughout the procedure under my direct supervision. CONTRAST:  15 mL Isovue 300 administered into the collecting system FLUOROSCOPY TIME:  1 minute (40  mGy) COMPLICATIONS: None immediate. PROCEDURE: The procedure, risks, benefits, and alternatives were explained to the patient. Questions regarding the procedure were encouraged and answered. The patient understands and consents to the procedure. A timeout was performed prior to the initiation of the procedure. The left flank region was prepped with Betadine in a sterile fashion, and a sterile drape was applied covering the operative field. A sterile gown and sterile gloves were used for the procedure. Local anesthesia was provided with 1% Lidocaine with epinephrine. Ultrasound was used to localize the left kidney. Under direct ultrasound guidance, a 21 gauge needle was advanced into the renal collecting system. An ultrasound image documentation was performed. Access within the collecting system was confirmed with the efflux of urine followed by contrast injection. Over an 0.018 wire, the tract was dilated with an Accustick stent. Over a guide wire, a 10-French percutaneous nephrostomy catheter was advanced into the collecting system where the coil was formed and locked. Contrast was injected and several sport radiographs were obtained in various obliquities confirming access. The catheter was secured at the skin with a Prolene retention suture and a gravity bag was placed. A dressing was placed. The patient tolerated procedure well without immediate postprocedural complication. FINDINGS: Ultrasound scanning demonstrates a mild to moderately dilated left renal collecting system. Under direct ultrasound guidance, a posterior inferior calix was targeted allowing advancement of an 10-French percutaneous nephrostomy catheter under intermittent fluoroscopic guidance. Following percutaneous nephrostomy catheter, purulent appearing, foul smelling urine was aspirated from the collecting system. A aspirated urine sample was capped and sent to the laboratory for analysis. Contrast injection confirmed appropriate positioning.  IMPRESSION: Successful ultrasound and fluoroscopic guided placement of a left sided 10 French PCN yielding purulent urine. A sample of aspirated urine was capped and sent to the laboratory for analysis. Electronically Signed   By: [name] M.D.   On: 04/13/2016 08:17    EKG:   Orders placed or performed during the hospital encounter of 04/12/16  . EKG 12-Lead  . EKG 12-Lead  . EKG 12-Lead  . EKG 12-Lead      Management plans discussed with the patient, family and they are in agreement.  CODE STATUS:     Code Status Orders        Start     Ordered   04/12/16 1703  Full code  Continuous     04/12/16 1702    Code Status History    Date Active Date Inactive Code Status Order ID Comments User Context   11/22/2015  6:10 PM 11/25/2015  2:33 PM Full Code [id number]  Enedina Finner, MD Inpatient   02/21/2015  8:30 PM 02/23/2015  9:59 PM Full Code [id number]  Houston Siren, MD Inpatient    Advance Directive Documentation   Flowsheet Row Most Recent Value  Type of Advance Directive  Living will  Pre-existing out of facility DNR order (yellow form or pink MOST  form)  No data  "MOST" Form in Place?  No data      TOTAL TIME TAKING CARE OF THIS PATIENT: 45  minutes.   Note: This dictation was prepared with Dragon dictation along with smaller phrase technology. Any transcriptional errors that result from this process are unintentional.   @M[facility] @  on 04/16/2016 at 2:58 PM  Between 7am to 6pm - Pager - [phone]  After 6pm go to [email or web address] - password [email or web address] [facility]  [facility] [place] [facility]  Office  [phone]  CC: Primary care physician; Lauro Regulus., MD

## 2016-04-16 NOTE — Progress Notes (Signed)
Pharmacy Antibiotic Note  [name] is a 82 y.o. male admitted on 04/12/2016 with pyelonephritis/bacteremia from klebsiella.  Pharmacy has been consulted for ciprofloxacin dosing. ID consulted.  Plan: Ciprofloxacin 400mg  IV Q24hr.     Height: 5\' 5"  (165.1 cm) Weight: 180 lb (81.6 kg) IBW/kg (Calculated) : 61.5  Temp (24hrs), Avg:98.6 F (37 C), Min:97.9 F (36.6 C), Max:99.3 F (37.4 C)   Recent Labs Lab 04/12/16 1123 04/12/16 1748 04/13/16 0340 04/14/16 0517 04/15/16 0919 04/16/16 0954  WBC 14.9* 14.5* 11.8* 11.8*  --  11.0*  CREATININE 3.01* 2.94* 3.06* 3.04* 2.67* 2.41*  LATICACIDVEN 4.0* 1.2  --   --   --   --     Estimated Creatinine Clearance: 21.6 mL/min (by C-G formula based on SCr of 2.41 mg/dL (H)).    Allergies  Allergen Reactions  . Exforge [Amlodipine Besylate-Valsartan] Swelling and Other (See Comments)    Pt states that he is unable to swallow.    . Lisinopril Swelling and Other (See Comments)    Pt states that he is unable to swallow.    . Niacin And Related Swelling and Other (See Comments)    Pt states that he is unable to swallow.      Antimicrobials this admission: Piperacillin/tazobactam 10/18 >> 10/18 Ceftriaxone 10/19 >> 10/19 Ciprofloxacin 10/19 >>   Dose adjustments this admission: N/A  Microbiology results: 10/16 BCx: klebsiella pneumoniae 10/16 UCx: klebsiella pneumoniae 10/16 Sputum: pending 10/16 MRSA PCR: negative  Pharmacy will continue to monitor and adjust per protocol.   Maleea Camilo A 04/16/2016 2:03 PM

## 2016-04-20 LAB — AEROBIC/ANAEROBIC CULTURE (SURGICAL/DEEP WOUND): SPECIAL REQUESTS: NORMAL

## 2016-04-20 LAB — AEROBIC/ANAEROBIC CULTURE W GRAM STAIN (SURGICAL/DEEP WOUND)

## 2016-04-28 ENCOUNTER — Ambulatory Visit (INDEPENDENT_AMBULATORY_CARE_PROVIDER_SITE_OTHER): Payer: Medicare Other | Admitting: Urology

## 2016-04-28 VITALS — BP 145/74 | HR 91 | Ht 65.0 in | Wt 163.0 lb

## 2016-04-28 DIAGNOSIS — N201 Calculus of ureter: Secondary | ICD-10-CM

## 2016-04-28 DIAGNOSIS — K5903 Drug induced constipation: Secondary | ICD-10-CM

## 2016-04-28 DIAGNOSIS — R338 Other retention of urine: Secondary | ICD-10-CM | POA: Diagnosis not present

## 2016-04-28 DIAGNOSIS — N401 Enlarged prostate with lower urinary tract symptoms: Secondary | ICD-10-CM

## 2016-04-28 MED ORDER — DOCUSATE SODIUM 100 MG PO CAPS: 100.0000 mg | ORAL_CAPSULE | Freq: Two times a day (BID) | ORAL | 0 refills | Status: AC | PRN

## 2016-04-28 NOTE — Progress Notes (Signed)
04/28/2016 4:52 PM   [name] 25-Dec-1927 [id number]  Referring provider: Lauro Regulus, MD [address] [facility] [place], [place] [address]  Chief Complaint  Patient presents with  . Pre-op Exam    HPI: 82 year old male with chronic urinary retention managed by an indwelling Foley catheter who returns to the office today after hospital admission for severe sepsis secondary to an obstructing 16 mm left mid ureteral stone.  He underwent urgent left ureteral percutaneous nephrostomy tube placement on 04/12/2016 and was admitted to the ICU for sequela of urosepsis.  His urine ultimately grew Klebsiella and his antibiotics were transitioned to Cipro at the time of discharge.  He also developed acute on chronic kidney injury which was improving slowly at the time of discharge back to his baseline of 1.7.  Today, he is overall feeling fatigued and deconditioned. He is anxious to have his stone treated and his percutaneous nephrostomy tube removed.  He does have a personal history of kidney stones.  No recent fevers or chills.  Both his nephrostomy tube and Foley catheter appear to be draining well.  He does complain today of constipation. He's not had a bowel movement several days.  PMH: Past Medical History:  Diagnosis Date  . Anemia   . BPH (benign prostatic hypertrophy)   . Cardiomyopathy (HCC)   . Coronary artery disease   . Diverticulosis   . Essential hypertension, benign   . GI bleed 2015  . History of kidney stones   . Hypersomnia with sleep apnea, unspecified   . MI (myocardial infarction) 1996  . Occasional tremors   . Other and unspecified hyperlipidemia   . Pure hypercholesterolemia   . Sleep apnea    unable to use C-PAP  . Type II or unspecified type diabetes mellitus without mention of complication, uncontrolled     Surgical History: Past Surgical History:  Procedure Laterality Date  . CARDIAC CATHETERIZATION    .  CHOLECYSTECTOMY  1994  . CORONARY ANGIOPLASTY  1996   [place]  . ESOPHAGOGASTRODUODENOSCOPY (EGD) WITH PROPOFOL Left 11/23/2015   Procedure: ESOPHAGOGASTRODUODENOSCOPY (EGD) WITH PROPOFOL;  Surgeon: [name], MD;  Location: [facility] ENDOSCOPY;  Service: Endoscopy;  Laterality: Left;  . EYE SURGERY Bilateral    Cataract Extraction with IOL   . FLEXIBLE SIGMOIDOSCOPY    . HERNIA REPAIR Left    Inguinal Hernia Repair  . inguinal hernia repair     left inguinal   . IR GENERIC HISTORICAL  04/12/2016   IR NEPHROSTOMY PLACEMENT LEFT 04/12/2016 [name], MD [facility]-INTERV RAD  . KNEE ARTHROSCOPY Left   . TONSILLECTOMY      Home Medications:    Medication List       Accurate as of 04/28/16 11:59 PM. Always use your most recent med list.          acetaminophen 325 MG tablet Commonly known as:  TYLENOL Take 2 tablets (650 mg total) by mouth every 6 (six) hours as needed for mild pain (or Fever >/= 101).   ciprofloxacin 500 MG tablet Commonly known as:  CIPRO Take 1 tablet (500 mg total) by mouth 2 (two) times daily.   Co Q 10 10 MG Caps Take 1 tablet by mouth daily.   colchicine-probenecid 0.5-500 MG tablet Take 1 tablet by mouth daily.   docusate sodium 100 MG capsule Commonly known as:  COLACE Take 1 capsule (100 mg total) by mouth 2 (two) times daily as needed (take to  keep stool soft.).   finasteride 5 MG tablet Commonly known as:  PROSCAR Take 1 tablet (5 mg total) by mouth daily.   furosemide 40 MG tablet Commonly known as:  LASIX Take 40-80 mg by mouth 2 (two) times daily. Take 2 tablets every am & 1 tablet every pm.   glimepiride 1 MG tablet Commonly known as:  AMARYL Take 1 mg by mouth daily with breakfast.   insulin aspart 100 UNIT/ML injection Commonly known as:  novoLOG Inject 3 Units into the skin 3 (three) times daily with meals.   LANTUS SOLOSTAR 100 UNIT/ML Solostar Pen Generic drug:  Insulin Glargine Inject 12 Units into the skin daily.     losartan 100 MG tablet Commonly known as:  COZAAR Take 100 mg by mouth daily.   Magnesium 400 MG Tabs Take 400 mg by mouth every other day.   MAGNESIUM PO Take by mouth.   metolazone 5 MG tablet Commonly known as:  ZAROXOLYN Take 5 mg by mouth as needed. Reported on 12/15/2015   oxyCODONE-acetaminophen 5-325 MG tablet Commonly known as:  PERCOCET/ROXICET Take 1 tablet by mouth every 4 (four) hours as needed for severe pain. Reported on 01/09/2016   potassium chloride 20 MEQ packet Commonly known as:  KLOR-CON Take 20 mEq by mouth daily.   tamsulosin 0.4 MG Caps capsule Commonly known as:  FLOMAX Take 1 capsule (0.4 mg total) by mouth daily.       Allergies:  Allergies  Allergen Reactions  . Exforge [Amlodipine Besylate-Valsartan] Swelling and Other (See Comments)    Pt states that he is unable to swallow.    . Lisinopril Swelling and Other (See Comments)    Pt states that he is unable to swallow.    . Niacin And Related Swelling and Other (See Comments)    Pt states that he is unable to swallow.      Family History: Family History  Problem Relation Age of Onset  . Rheum arthritis Mother   . Alzheimer's disease Father     Social History:  reports that he has quit smoking. His smoking use included Cigarettes. He has a 50.00 pack-year smoking history. He has never used smokeless tobacco. He reports that he does not drink alcohol or use drugs.  ROS: UROLOGY Frequent Urination?: No Hard to postpone urination?: No Burning/pain with urination?: No Get up at night to urinate?: No Leakage of urine?: No Urine stream starts and stops?: No Trouble starting stream?: No Do you have to strain to urinate?: No Blood in urine?: No Urinary tract infection?: No Sexually transmitted disease?: No Injury to kidneys or bladder?: No Painful intercourse?: No Weak stream?: No Erection problems?: No Penile pain?: No  Gastrointestinal Nausea?: No Vomiting?:  No Indigestion/heartburn?: No Diarrhea?: No Constipation?: Yes  Constitutional Fever: No Night sweats?: No Weight loss?: No Fatigue?: Yes  Skin Skin rash/lesions?: No Itching?: No  Eyes Blurred vision?: No Double vision?: No  Ears/Nose/Throat Sore throat?: No Sinus problems?: No  Hematologic/Lymphatic Swollen glands?: No Easy bruising?: No  Cardiovascular Leg swelling?: Yes Chest pain?: No  Respiratory Cough?: No Shortness of breath?: No  Endocrine Excessive thirst?: No  Musculoskeletal Back pain?: No Joint pain?: No  Neurological Headaches?: No Dizziness?: No  Psychologic Depression?: No Anxiety?: No  Physical Exam: BP (!) 145/74   Pulse 91   Ht 5\' 5"  (1.651 m)   Wt 163 lb (73.9 kg)   BMI 27.12 kg/m   Constitutional:  Alert and oriented, No acute distress.  Accompanied today by his wife.  Elderly, frail. HEENT: [place] AT, moist mucus membranes.  Trachea midline, no masses. Cardiovascular: RRR. Respiratory: Normal respiratory effort, no increased work of breathing. CTAB. GI: Abdomen is soft, nontender, nondistended, no abdominal masses GU: Foley catheter draining clear yellow urine. Left nephrostomy tube in place, site clean dry and intact. Skin: No rashes, bruises or suspicious lesions. Neurologic: Grossly intact, no focal deficits, moving all 4 extremities. Psychiatric: Normal mood and affect.  Laboratory Data: Lab Results  Component Value Date   WBC 11.0 (H) 04/16/2016   HGB 9.7 (L) 04/16/2016   HCT 29.1 (L) 04/16/2016   MCV 80.9 04/16/2016   PLT 151 04/16/2016    Lab Results  Component Value Date   CREATININE 2.77 (H) 04/30/2016     Lab Results  Component Value Date   HGBA1C 7.3 (H) 04/12/2016    Urinalysis    Component Value Date/Time   COLORURINE RED (A) 04/12/2016 1123   APPEARANCEUR CLOUDY (A) 04/12/2016 1123   LABSPEC 1.014 04/12/2016 1123   PHURINE 5.0 04/12/2016 1123   GLUCOSEU NEGATIVE 04/12/2016 1123   HGBUR 3+  (A) 04/12/2016 1123   BILIRUBINUR NEGATIVE 04/12/2016 1123   KETONESUR NEGATIVE 04/12/2016 1123   PROTEINUR 100 (A) 04/12/2016 1123   NITRITE NEGATIVE 04/12/2016 1123   LEUKOCYTESUR 3+ (A) 04/12/2016 1123    Pertinent Imaging: CLINICAL DATA:  confusion and weakness since 6am. Family reports patient complaining of abdominal pain yesterday and taking pain medication and becoming sleepier than usual. Patient feels hot to the touch. Patient is extremely weak and confused. This RN had difficulty getting patient from the vehicle. Hx of cholecystectomy and inguinal hernia repair  EXAM: CT ABDOMEN AND PELVIS WITHOUT CONTRAST  TECHNIQUE: Multidetector CT imaging of the abdomen and pelvis was performed following the standard protocol without IV contrast.  COMPARISON:  04/30/2009  FINDINGS: Lower chest: Patchy subsegmental atelectasis or scarring posteriorly in the visualized lung bases left greater than right. Coronary calcifications. Four-chamber cardiac enlargement. Blood pool less dense than the interventricular septum suggesting anemia.  Hepatobiliary: No focal liver abnormality is seen. Status post cholecystectomy. No biliary dilatation.  Pancreas: Mild diffuse atrophy without mass or ductal dilatation.  Spleen: Normal in size without focal abnormality.  Adrenals/Urinary Tract: Stable 15 mm left adrenal nodule. Moderate left hydronephrosis and marked ureterectasis down to the level of a 15 mm mid ureteral calculus. Foley catheter decompresses the urinary bladder. 8 mm calculus in the lumen of the urinary bladder. Right kidney and adrenal gland unremarkable.  Stomach/Bowel: Stomach, small bowel, and colon are nondilated. Scattered descending and sigmoid diverticula.  Vascular/Lymphatic: Moderate aortoiliac arterial calcifications. Right common iliac artery dilated 2.2 cm diameter, left also 2.2 cm.  Reproductive: Prostatic enlargement.  Foley catheter in  place.  Other: No ascites.  No free air.  Musculoskeletal: Degenerative disc disease L4-5 and L5-S1. Negative for fracture or dislocation.  IMPRESSION: 1. Obstructing 15 mm left ureteral calculus. Moderate left hydronephrosis and proximal ureterectasis. 2. 8 mm urinary bladder calculus. 3. Descending and sigmoid diverticulosis. 4. Cardiomegaly with coronary calcifications. 5. 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   Electronically Signed   By: Corlis Leak M.D.   On: 04/12/2016 13:37  CT scan personally reviewed today.  Assessment & Plan:    1. Left ureteral stone Discuss definitive management for his obstructing 15 mm left mid ureteral stone Given the size of the stone and amount of stone fragmentation, I do not feel that ESWL  would be appropriate in this situation due to concern for steinstrasse as well suspected J hooking within the distal ureter due to prostatomegaly I have recommended proceeding to the operating room with ureteroscopy, laser lithotripsy, ureteral stent placement with possible nephrostomy tube removal Risk and benefits of this were discussed in detail including risk of bleeding, infection, damage to any structures, ureteral stricture formation, need for multiple procedures, failure of the procedure amongst others.  We will check a preop urine culture and double cover with antibiotics due to concern for recurrent urosepsis. Based on his anatomy, I do also have some concern for accessing the left UO given the size of his prostate gland. As such, I have consented the patient for use of the thulium laser (ablation/ hemostatic) to take down any intravesical prostate which may be obstructing the lobe as needed in order to access the ureter.  He understands that the entire gland will not be treated on this occasion but only as needed to achieve stone free ureter He will likely need cardiac clearance for his history of  CAD/ MI for surgery- Dr. Mariah MillingGollan - CULTURE, URINE COMPREHENSIVE  2. Benign prostatic hyperplasia with urinary retention As above Will consider outlet procedure followed by possible UDS down the road  3. Drug-induced constipation Aggressive bowel regimen recommended followed by maintenance with Colace  Schedule surgery as above  Vanna ScotlandAshley Kaylanie Capili, MD  [facility] [address], [address] [place], [place] [address] [phone]  I spent 25 min with this patient of which greater than 50% was spent in counseling and coordination of care with the patient.   Records from recent inpatient admission were reviewed in detail.

## 2016-04-29 ENCOUNTER — Telehealth: Payer: Self-pay | Admitting: Radiology

## 2016-04-29 ENCOUNTER — Other Ambulatory Visit: Payer: Self-pay | Admitting: Radiology

## 2016-04-29 ENCOUNTER — Telehealth: Payer: Self-pay | Admitting: Cardiovascular Disease

## 2016-04-29 DIAGNOSIS — N201 Calculus of ureter: Secondary | ICD-10-CM

## 2016-04-29 NOTE — Telephone Encounter (Signed)
Notified pt of surgery scheduled with Dr Apolinar Junes on 05/11/16, pre-admit testing appt on 04/30/16 @8 :45 & to call day prior to surgery for arrival time to SDS. Pt voices understanding.

## 2016-04-29 NOTE — Telephone Encounter (Signed)
Received cardiac clearance request for pt to proceed w/ left ureteroscopy, laser lithotripsy, left urethral stent placement, possible laser ablation of prostate, cystolitholapaxy, nephrostomy tube removal on 05/11/16 w/ Dr. Apolinar Junes. Please route clearance to Amy @ [phone].

## 2016-04-29 NOTE — Telephone Encounter (Signed)
Acceptable risk for urologic procedure No further testing needed

## 2016-04-29 NOTE — Telephone Encounter (Signed)
No answer, unable to leave VM. Need to discuss surgery information.

## 2016-04-30 ENCOUNTER — Encounter
Admission: RE | Admit: 2016-04-30 | Discharge: 2016-04-30 | Disposition: A | Discharge status: Home / Self Care | Payer: Medicare Other | Source: Ambulatory Visit | Attending: Urology | Admitting: Urology

## 2016-04-30 DIAGNOSIS — Z01818 Encounter for other preprocedural examination: Secondary | ICD-10-CM | POA: Insufficient documentation

## 2016-04-30 HISTORY — DX: Tremor, unspecified: R25.1

## 2016-04-30 HISTORY — DX: Personal history of urinary calculi: Z87.442

## 2016-04-30 HISTORY — DX: Sleep apnea, unspecified: G47.30

## 2016-04-30 HISTORY — DX: Cardiomyopathy, unspecified: I42.9

## 2016-04-30 HISTORY — DX: Anemia, unspecified: D64.9

## 2016-04-30 LAB — BASIC METABOLIC PANEL
ANION GAP: 12 (ref 5–15)
BUN: 53 mg/dL — ABNORMAL HIGH (ref 6–20)
CHLORIDE: 87 mmol/L — AB (ref 101–111)
CO2: 29 mmol/L (ref 22–32)
Calcium: 8.8 mg/dL — ABNORMAL LOW (ref 8.9–10.3)
Creatinine, Ser: 2.77 mg/dL — ABNORMAL HIGH (ref 0.61–1.24)
GFR calc non Af Amer: 19 mL/min — ABNORMAL LOW (ref >60)
GFR, EST AFRICAN AMERICAN: 22 mL/min — AB (ref >60)
Glucose, Bld: 196 mg/dL — ABNORMAL HIGH (ref 65–99)
POTASSIUM: 3.4 mmol/L — AB (ref 3.5–5.1)
Sodium: 128 mmol/L — ABNORMAL LOW (ref 135–145)

## 2016-04-30 LAB — CULTURE, URINE COMPREHENSIVE

## 2016-04-30 NOTE — Patient Instructions (Signed)
  Your procedure is scheduled on: May 11, 2016 (Tuesday) Report to Same Day Surgery 2nd floor Medical Mall To find out your arrival time please call [phone] between 1PM - 3PM on May 10, 2016 (Monday)  Remember: Instructions that are not followed completely may result in serious medical risk, up to and including death, or upon the discretion of your surgeon and anesthesiologist your surgery may need to be rescheduled.    _x___ 1. Do not eat food or drink liquids after midnight. No gum chewing or hard candies.     __x__ 2. No Alcohol for 24 hours before or after surgery.   __x__3. No Smoking for 24 prior to surgery.   ____  4. Bring all medications with you on the day of surgery if instructed.    __x__ 5. Notify your doctor if there is any change in your medical condition     (cold, fever, infections).     Do not wear jewelry, make-up, hairpins, clips or nail polish.  Do not wear lotions, powders, or perfumes. You may wear deodorant.  Do not shave 48 hours prior to surgery. Men may shave face and neck.  Do not bring valuables to the hospital.    [facility] is not responsible for any belongings or valuables.               Contacts, dentures or bridgework may not be worn into surgery.  Leave your suitcase in the car. After surgery it may be brought to your room.  For patients admitted to the hospital, discharge time is determined by your treatment team.   Patients discharged the day of surgery will not be allowed to drive home.    Please read over the following fact sheets that you were given:   [facility] Preparing for Surgery and or MRSA Information   _x___ Take these medicines the morning of surgery with A SIP OF WATER:    1. Losartan  2.  3.  4.  5.  6.  ____Fleets enema or Magnesium Citrate as directed.   ___ Use CHG Soap or sage wipes as directed on instruction sheet   ____ Use inhalers on the day of surgery and bring to hospital day of  surgery  ____ Stop metformin 2 days prior to surgery    _x___ Take 1/2 of usual insulin dose the night before surgery and none on the morning of surgery. (DO NOT TAKE ANY INSULIN THE MORNING OF SURGERY)            _x___ Stop aspirin or coumadin, or plavix (NO ASPIRIN)  x__ Stop Anti-inflammatories such as Advil, Aleve, Ibuprofen, Motrin, Naproxen,          Naprosyn, Goodies powders or aspirin products. Ok to take Tylenol.   _x___ Stop supplements until after surgery. (STOP CO Q 10 NOW)  ____ Bring C-Pap to the hospital.

## 2016-04-30 NOTE — Telephone Encounter (Signed)
Please confirm that Dr Mariah Milling was aware of EKG done by Dr Einar Crow on 03/02/16 prior to giving clearance.

## 2016-04-30 NOTE — Telephone Encounter (Signed)
Routed to fax # provided. 

## 2016-04-30 NOTE — Pre-Procedure Instructions (Signed)
Dr. Henrene Hawking made aware of EKG on 03/24/16 , patient history, and previous EKG, and requested that Dr. Mariah Milling is aware of EKG done on 03/24/16 that stated complete heart block.

## 2016-04-30 NOTE — Pre-Procedure Instructions (Signed)
Amy at Dr. Apolinar Junes office notified of request that Dr. Mariah Milling is aware of EKG done on 03/24/16 and is ok for surgery.

## 2016-04-30 NOTE — Pre-Procedure Instructions (Signed)
Component Name Value Ref Range  Vent Rate (bpm) 41   QRS Interval (msec) 90   QT Interval (msec) 558   QTc (msec) 460   Result Narrative  Sinus tachycardia with complete heart block and Junctional bradycardia Nonspecific ST abnormality Abnormal ECG No previous ECGs available I reviewed and concur with this report. Electronically signed UE:[email or web address] MD, MARSHALL [phone]) on 03/24/2016 1:17:20 PM  Status Results Details

## 2016-05-04 NOTE — Telephone Encounter (Signed)
Looked at EKG from October 2017 in our system Looks fine Acceptable risk for surgery

## 2016-05-11 ENCOUNTER — Ambulatory Visit: Payer: Medicare Other | Admitting: Certified Registered"

## 2016-05-11 ENCOUNTER — Ambulatory Visit
Admission: RE | Admit: 2016-05-11 | Discharge: 2016-05-11 | Disposition: A | Discharge status: Home / Self Care | Payer: Medicare Other | Source: Ambulatory Visit | Attending: Urology | Admitting: Urology

## 2016-05-11 ENCOUNTER — Encounter
Admission: RE | Disposition: A | Discharge status: Home / Self Care | Payer: Self-pay | Source: Ambulatory Visit | Attending: Urology

## 2016-05-11 ENCOUNTER — Encounter: Payer: Self-pay | Admitting: *Deleted

## 2016-05-11 DIAGNOSIS — N401 Enlarged prostate with lower urinary tract symptoms: Secondary | ICD-10-CM | POA: Insufficient documentation

## 2016-05-11 DIAGNOSIS — Z79899 Other long term (current) drug therapy: Secondary | ICD-10-CM | POA: Diagnosis not present

## 2016-05-11 DIAGNOSIS — N132 Hydronephrosis with renal and ureteral calculous obstruction: Secondary | ICD-10-CM | POA: Insufficient documentation

## 2016-05-11 DIAGNOSIS — N201 Calculus of ureter: Secondary | ICD-10-CM | POA: Diagnosis not present

## 2016-05-11 DIAGNOSIS — Z794 Long term (current) use of insulin: Secondary | ICD-10-CM | POA: Insufficient documentation

## 2016-05-11 DIAGNOSIS — I2510 Atherosclerotic heart disease of native coronary artery without angina pectoris: Secondary | ICD-10-CM | POA: Insufficient documentation

## 2016-05-11 DIAGNOSIS — I429 Cardiomyopathy, unspecified: Secondary | ICD-10-CM | POA: Insufficient documentation

## 2016-05-11 DIAGNOSIS — N210 Calculus in bladder: Secondary | ICD-10-CM | POA: Insufficient documentation

## 2016-05-11 DIAGNOSIS — D649 Anemia, unspecified: Secondary | ICD-10-CM | POA: Diagnosis not present

## 2016-05-11 DIAGNOSIS — N200 Calculus of kidney: Secondary | ICD-10-CM

## 2016-05-11 DIAGNOSIS — I252 Old myocardial infarction: Secondary | ICD-10-CM | POA: Diagnosis not present

## 2016-05-11 DIAGNOSIS — I509 Heart failure, unspecified: Secondary | ICD-10-CM | POA: Diagnosis not present

## 2016-05-11 DIAGNOSIS — I110 Hypertensive heart disease with heart failure: Secondary | ICD-10-CM | POA: Diagnosis not present

## 2016-05-11 DIAGNOSIS — E1151 Type 2 diabetes mellitus with diabetic peripheral angiopathy without gangrene: Secondary | ICD-10-CM | POA: Insufficient documentation

## 2016-05-11 DIAGNOSIS — Z87891 Personal history of nicotine dependence: Secondary | ICD-10-CM | POA: Diagnosis not present

## 2016-05-11 DIAGNOSIS — G4730 Sleep apnea, unspecified: Secondary | ICD-10-CM | POA: Insufficient documentation

## 2016-05-11 DIAGNOSIS — Z87442 Personal history of urinary calculi: Secondary | ICD-10-CM | POA: Insufficient documentation

## 2016-05-11 DIAGNOSIS — K5903 Drug induced constipation: Secondary | ICD-10-CM | POA: Diagnosis not present

## 2016-05-11 DIAGNOSIS — R338 Other retention of urine: Secondary | ICD-10-CM | POA: Diagnosis not present

## 2016-05-11 HISTORY — PX: NEPHROSTOMY: SHX1014

## 2016-05-11 HISTORY — PX: CYSTOSCOPY WITH LITHOLAPAXY: SHX1425

## 2016-05-11 HISTORY — PX: CYSTOSCOPY/URETEROSCOPY/HOLMIUM LASER/STENT PLACEMENT: SHX6546

## 2016-05-11 LAB — GLUCOSE, CAPILLARY
GLUCOSE-CAPILLARY: 182 mg/dL — AB (ref 65–99)
Glucose-Capillary: 158 mg/dL — ABNORMAL HIGH (ref 65–99)

## 2016-05-11 SURGERY — CYSTOSCOPY/URETEROSCOPY/HOLMIUM LASER/STENT PLACEMENT
Anesthesia: General

## 2016-05-11 MED ORDER — IOTHALAMATE MEGLUMINE 43 % IV SOLN
INTRAVENOUS | Status: DC | PRN
  Administered 2016-05-11: 40 mL

## 2016-05-11 MED ORDER — DEXTROSE 5 % IV SOLN
INTRAVENOUS | Status: DC | PRN
  Administered 2016-05-11: 25 ug/min via INTRAVENOUS

## 2016-05-11 MED ORDER — OXYCODONE-ACETAMINOPHEN 5-325 MG PO TABS
ORAL_TABLET | ORAL | Status: AC
  Filled 2016-05-11: qty 1

## 2016-05-11 MED ORDER — CIPROFLOXACIN HCL 500 MG PO TABS: 500.0000 mg | ORAL_TABLET | Freq: Two times a day (BID) | ORAL | 0 refills | Status: DC

## 2016-05-11 MED ORDER — GENTAMICIN IN SALINE 1.6-0.9 MG/ML-% IV SOLN
80.0000 mg | INTRAVENOUS | Status: AC
  Administered 2016-05-11: 80 mg via INTRAVENOUS
  Filled 2016-05-11: qty 50

## 2016-05-11 MED ORDER — ONDANSETRON HCL 4 MG/2ML IJ SOLN: 4.0000 mg | Freq: Once | INTRAMUSCULAR | Status: DC | PRN

## 2016-05-11 MED ORDER — ROCURONIUM BROMIDE 100 MG/10ML IV SOLN
INTRAVENOUS | Status: DC | PRN
  Administered 2016-05-11: 5 mg via INTRAVENOUS
  Administered 2016-05-11: 10 mg via INTRAVENOUS
  Administered 2016-05-11: 30 mg via INTRAVENOUS
  Administered 2016-05-11: 10 mg via INTRAVENOUS
  Administered 2016-05-11 (×3): 5 mg via INTRAVENOUS

## 2016-05-11 MED ORDER — SUGAMMADEX SODIUM 200 MG/2ML IV SOLN
INTRAVENOUS | Status: DC | PRN
  Administered 2016-05-11: 150 mg via INTRAVENOUS

## 2016-05-11 MED ORDER — FAMOTIDINE 20 MG PO TABS
ORAL_TABLET | ORAL | Status: AC
  Administered 2016-05-11: 20 mg via ORAL
  Filled 2016-05-11: qty 1

## 2016-05-11 MED ORDER — PROPOFOL 10 MG/ML IV BOLUS
INTRAVENOUS | Status: DC | PRN
  Administered 2016-05-11: 110 mg via INTRAVENOUS

## 2016-05-11 MED ORDER — OXYCODONE-ACETAMINOPHEN 5-325 MG PO TABS
1.0000 | ORAL_TABLET | ORAL | Status: DC | PRN
  Administered 2016-05-11: 1 via ORAL

## 2016-05-11 MED ORDER — FENTANYL CITRATE (PF) 100 MCG/2ML IJ SOLN
INTRAMUSCULAR | Status: AC
  Administered 2016-05-11: 25 ug via INTRAVENOUS
  Filled 2016-05-11: qty 2

## 2016-05-11 MED ORDER — LIDOCAINE HCL (CARDIAC) 20 MG/ML IV SOLN
INTRAVENOUS | Status: DC | PRN
  Administered 2016-05-11: 80 mg via INTRAVENOUS

## 2016-05-11 MED ORDER — PHENYLEPHRINE HCL 10 MG/ML IJ SOLN
INTRAMUSCULAR | Status: DC | PRN
  Administered 2016-05-11 (×2): 100 ug via INTRAVENOUS
  Administered 2016-05-11: 50 ug via INTRAVENOUS
  Administered 2016-05-11 (×4): 100 ug via INTRAVENOUS

## 2016-05-11 MED ORDER — ONDANSETRON HCL 4 MG/2ML IJ SOLN
INTRAMUSCULAR | Status: DC | PRN
  Administered 2016-05-11: 4 mg via INTRAVENOUS

## 2016-05-11 MED ORDER — BELLADONNA ALKALOIDS-OPIUM 16.2-60 MG RE SUPP
RECTAL | Status: AC
  Filled 2016-05-11: qty 1

## 2016-05-11 MED ORDER — CEFTRIAXONE SODIUM 1 G IJ SOLR
1.0000 g | INTRAMUSCULAR | Status: AC
  Administered 2016-05-11: 1 g via INTRAVENOUS
  Filled 2016-05-11: qty 10

## 2016-05-11 MED ORDER — OXYCODONE-ACETAMINOPHEN 5-325 MG PO TABS: 1.0000 | ORAL_TABLET | ORAL | 0 refills | Status: DC | PRN

## 2016-05-11 MED ORDER — SUCCINYLCHOLINE CHLORIDE 20 MG/ML IJ SOLN
INTRAMUSCULAR | Status: DC | PRN
  Administered 2016-05-11: 80 mg via INTRAVENOUS

## 2016-05-11 MED ORDER — SODIUM CHLORIDE 0.9 % IV SOLN
INTRAVENOUS | Status: DC
  Administered 2016-05-11: 07:00:00 via INTRAVENOUS

## 2016-05-11 MED ORDER — FENTANYL CITRATE (PF) 100 MCG/2ML IJ SOLN
INTRAMUSCULAR | Status: DC | PRN
  Administered 2016-05-11: 100 ug via INTRAVENOUS
  Administered 2016-05-11: 50 ug via INTRAVENOUS
  Administered 2016-05-11 (×2): 25 ug via INTRAVENOUS

## 2016-05-11 MED ORDER — FAMOTIDINE 20 MG PO TABS
20.0000 mg | ORAL_TABLET | Freq: Once | ORAL | Status: AC
  Administered 2016-05-11: 20 mg via ORAL

## 2016-05-11 MED ORDER — FENTANYL CITRATE (PF) 100 MCG/2ML IJ SOLN
25.0000 ug | INTRAMUSCULAR | Status: DC | PRN
  Administered 2016-05-11 (×4): 25 ug via INTRAVENOUS

## 2016-05-11 MED ORDER — BELLADONNA ALKALOIDS-OPIUM 16.2-60 MG RE SUPP
1.0000 | Freq: Once | RECTAL | Status: AC
Start: 2016-05-11 — End: 2016-05-11
  Administered 2016-05-11: 1 via RECTAL

## 2016-05-11 SURGICAL SUPPLY — 40 items
BAG DRAIN CYSTO-URO LG1000N (MISCELLANEOUS) ×4 IMPLANT
BAG URINE DRAIN UROCATCH STRL (MISCELLANEOUS) ×4 IMPLANT
BASKET ZERO TIP 1.9FR (BASKET) ×4 IMPLANT
BRUSH SCRUB EZ 1% IODOPHOR (MISCELLANEOUS) ×4 IMPLANT
CATH FOL 2WAY LX 20X30 (CATHETERS) ×4 IMPLANT
CATH URETL 5X70 OPEN END (CATHETERS) ×4 IMPLANT
CNTNR SPEC 2.5X3XGRAD LEK (MISCELLANEOUS)
CONRAY 43 FOR UROLOGY 50M (MISCELLANEOUS) ×4 IMPLANT
CONT SPEC 4OZ STER OR WHT (MISCELLANEOUS)
CONTAINER SPEC 2.5X3XGRAD LEK (MISCELLANEOUS) IMPLANT
DRAPE UTILITY 15X26 TOWEL STRL (DRAPES) ×4 IMPLANT
FIBER LASER LITHO 273 (Laser) ×4 IMPLANT
GLIDEWIRE STIFF .35X180X3 HYDR (WIRE) ×4 IMPLANT
GLOVE BIO SURGEON STRL SZ 6.5 (GLOVE) ×4 IMPLANT
GLOVE BIO SURGEON STRL SZ7 (GLOVE) ×8 IMPLANT
GOWN STRL REUS W/ TWL LRG LVL3 (GOWN DISPOSABLE) ×6 IMPLANT
GOWN STRL REUS W/TWL LRG LVL3 (GOWN DISPOSABLE) ×2
GUIDEWIRE SUPER STIFF (WIRE) IMPLANT
INFUSOR MANOMETER BAG 3000ML (MISCELLANEOUS) ×4 IMPLANT
INTRODUCER DILATOR DOUBLE (INTRODUCER) IMPLANT
IV NS IRRIG 3000ML ARTHROMATIC (IV SOLUTION) ×20 IMPLANT
KIT RM TURNOVER CYSTO AR (KITS) ×4 IMPLANT
PACK CYSTO AR (MISCELLANEOUS) ×4 IMPLANT
PERCUFLEX PLUS UREERTAL STENT ×4 IMPLANT
PREP PVP WINGED SPONGE (MISCELLANEOUS) ×4 IMPLANT
QUANTA SYSTEM ×4 IMPLANT
SENSORWIRE 0.038 NOT ANGLED (WIRE) ×8
SET CYSTO W/LG BORE CLAMP LF (SET/KITS/TRAYS/PACK) ×4 IMPLANT
SET IRRIG Y TYPE TUR BLADDER L (SET/KITS/TRAYS/PACK) ×4 IMPLANT
SHEATH URETERAL 12FRX35CM (MISCELLANEOUS) IMPLANT
SOL .9 NS 3000ML IRR  AL (IV SOLUTION) ×1
SOL .9 NS 3000ML IRR UROMATIC (IV SOLUTION) ×3 IMPLANT
STENT URET 6FRX24 CONTOUR (STENTS) IMPLANT
STENT URET 6FRX26 CONTOUR (STENTS) IMPLANT
STENT URET 6FRX26 FIRM (Stent) ×4 IMPLANT
SURGILUBE 2OZ TUBE FLIPTOP (MISCELLANEOUS) ×4 IMPLANT
SYRINGE IRR TOOMEY STRL 70CC (SYRINGE) ×4 IMPLANT
WATER STERILE IRR 1000ML POUR (IV SOLUTION) ×4 IMPLANT
WATER STERILE IRR 3000ML UROMA (IV SOLUTION) IMPLANT
WIRE SENSOR 0.038 NOT ANGLED (WIRE) ×6 IMPLANT

## 2016-05-11 NOTE — H&P (View-Only) (Signed)
 04/28/2016 4:52 PM   [name] 01/11/1928 [id number]  Referring provider: Marshall W Anderson, MD [address] [facility] [place], [place] [address]  Chief Complaint  Patient presents with  . Pre-op Exam    HPI: 82-year-old male with chronic urinary retention managed by an indwelling Foley catheter who returns to the office today after hospital admission for severe sepsis secondary to an obstructing 16 mm left mid ureteral stone.  He underwent urgent left ureteral percutaneous nephrostomy tube placement on 04/12/2016 and was admitted to the ICU for sequela of urosepsis.  His urine ultimately grew Klebsiella and his antibiotics were transitioned to Cipro at the time of discharge.  He also developed acute on chronic kidney injury which was improving slowly at the time of discharge back to his baseline of 1.7.  Today, he is overall feeling fatigued and deconditioned. He is anxious to have his stone treated and his percutaneous nephrostomy tube removed.  He does have a personal history of kidney stones.  No recent fevers or chills.  Both his nephrostomy tube and Foley catheter appear to be draining well.  He does complain today of constipation. He's not had a bowel movement several days.  PMH: Past Medical History:  Diagnosis Date  . Anemia   . BPH (benign prostatic hypertrophy)   . Cardiomyopathy (HCC)   . Coronary artery disease   . Diverticulosis   . Essential hypertension, benign   . GI bleed 2015  . History of kidney stones   . Hypersomnia with sleep apnea, unspecified   . MI (myocardial infarction) 1996  . Occasional tremors   . Other and unspecified hyperlipidemia   . Pure hypercholesterolemia   . Sleep apnea    unable to use C-PAP  . Type II or unspecified type diabetes mellitus without mention of complication, uncontrolled     Surgical History: Past Surgical History:  Procedure Laterality Date  . CARDIAC CATHETERIZATION    .  CHOLECYSTECTOMY  1994  . CORONARY ANGIOPLASTY  1996   [place]  . ESOPHAGOGASTRODUODENOSCOPY (EGD) WITH PROPOFOL Left 11/23/2015   Procedure: ESOPHAGOGASTRODUODENOSCOPY (EGD) WITH PROPOFOL;  Surgeon: Edward L Barnes, MD;  Location: [facility] ENDOSCOPY;  Service: Endoscopy;  Laterality: Left;  . EYE SURGERY Bilateral    Cataract Extraction with IOL   . FLEXIBLE SIGMOIDOSCOPY    . HERNIA REPAIR Left    Inguinal Hernia Repair  . inguinal hernia repair     left inguinal   . IR GENERIC HISTORICAL  04/12/2016   IR NEPHROSTOMY PLACEMENT LEFT 04/12/2016 John Watts, MD [facility]-INTER[facility] RAD  . KNEE ARTHROSCOPY Left   . TONSILLECTOMY      Home Medications:    Medication List       Accurate as of 04/28/16 11:59 PM. Always use your most recent med list.          acetaminophen 325 MG tablet Commonly known as:  TYLENOL Take 2 tablets (650 mg total) by mouth every 6 (six) hours as needed for mild pain (or Fever >/= 101).   ciprofloxacin 500 MG tablet Commonly known as:  CIPRO Take 1 tablet (500 mg total) by mouth 2 (two) times daily.   Co Q 10 10 MG Caps Take 1 tablet by mouth daily.   colchicine-probenecid 0.5-500 MG tablet Take 1 tablet by mouth daily.   docusate sodium 100 MG capsule Commonly known as:  COLACE Take 1 capsule (100 mg total) by mouth 2 (two) times daily as needed (take to   keep stool soft.).   finasteride 5 MG tablet Commonly known as:  PROSCAR Take 1 tablet (5 mg total) by mouth daily.   furosemide 40 MG tablet Commonly known as:  LASIX Take 40-80 mg by mouth 2 (two) times daily. Take 2 tablets every am & 1 tablet every pm.   glimepiride 1 MG tablet Commonly known as:  AMARYL Take 1 mg by mouth daily with breakfast.   insulin aspart 100 UNIT/ML injection Commonly known as:  novoLOG Inject 3 Units into the skin 3 (three) times daily with meals.   LANTUS SOLOSTAR 100 UNIT/ML Solostar Pen Generic drug:  Insulin Glargine Inject 12 Units into the skin daily.     losartan 100 MG tablet Commonly known as:  COZAAR Take 100 mg by mouth daily.   Magnesium 400 MG Tabs Take 400 mg by mouth every other day.   MAGNESIUM PO Take by mouth.   metolazone 5 MG tablet Commonly known as:  ZAROXOLYN Take 5 mg by mouth as needed. Reported on 12/15/2015   oxyCODONE-acetaminophen 5-325 MG tablet Commonly known as:  PERCOCET/ROXICET Take 1 tablet by mouth every 4 (four) hours as needed for severe pain. Reported on 01/09/2016   potassium chloride 20 MEQ packet Commonly known as:  KLOR-CON Take 20 mEq by mouth daily.   tamsulosin 0.4 MG Caps capsule Commonly known as:  FLOMAX Take 1 capsule (0.4 mg total) by mouth daily.       Allergies:  Allergies  Allergen Reactions  . Exforge [Amlodipine Besylate-Valsartan] Swelling and Other (See Comments)    Pt states that he is unable to swallow.    . Lisinopril Swelling and Other (See Comments)    Pt states that he is unable to swallow.    . Niacin And Related Swelling and Other (See Comments)    Pt states that he is unable to swallow.      Family History: Family History  Problem Relation Age of Onset  . Rheum arthritis Mother   . Alzheimer's disease Father     Social History:  reports that he has quit smoking. His smoking use included Cigarettes. He has a 50.00 pack-year smoking history. He has never used smokeless tobacco. He reports that he does not drink alcohol or use drugs.  ROS: UROLOGY Frequent Urination?: No Hard to postpone urination?: No Burning/pain with urination?: No Get up at night to urinate?: No Leakage of urine?: No Urine stream starts and stops?: No Trouble starting stream?: No Do you have to strain to urinate?: No Blood in urine?: No Urinary tract infection?: No Sexually transmitted disease?: No Injury to kidneys or bladder?: No Painful intercourse?: No Weak stream?: No Erection problems?: No Penile pain?: No  Gastrointestinal Nausea?: No Vomiting?:  No Indigestion/heartburn?: No Diarrhea?: No Constipation?: Yes  Constitutional Fever: No Night sweats?: No Weight loss?: No Fatigue?: Yes  Skin Skin rash/lesions?: No Itching?: No  Eyes Blurred vision?: No Double vision?: No  Ears/Nose/Throat Sore throat?: No Sinus problems?: No  Hematologic/Lymphatic Swollen glands?: No Easy bruising?: No  Cardiovascular Leg swelling?: Yes Chest pain?: No  Respiratory Cough?: No Shortness of breath?: No  Endocrine Excessive thirst?: No  Musculoskeletal Back pain?: No Joint pain?: No  Neurological Headaches?: No Dizziness?: No  Psychologic Depression?: No Anxiety?: No  Physical Exam: BP (!) 145/74   Pulse 91   Ht 5' 5" (1.651 m)   Wt 163 lb (73.9 kg)   BMI 27.12 kg/m   Constitutional:  Alert and oriented, No acute distress.    Accompanied today by his wife.  Elderly, frail. HEENT: [place] AT, moist mucus membranes.  Trachea midline, no masses. Cardiovascular: RRR. Respiratory: Normal respiratory effort, no increased work of breathing. CTAB. GI: Abdomen is soft, nontender, nondistended, no abdominal masses GU: Foley catheter draining clear yellow urine. Left nephrostomy tube in place, site clean dry and intact. Skin: No rashes, bruises or suspicious lesions. Neurologic: Grossly intact, no focal deficits, moving all 4 extremities. Psychiatric: Normal mood and affect.  Laboratory Data: Lab Results  Component Value Date   WBC 11.0 (H) 04/16/2016   HGB 9.7 (L) 04/16/2016   HCT 29.1 (L) 04/16/2016   MCV 80.9 04/16/2016   PLT 151 04/16/2016    Lab Results  Component Value Date   CREATININE 2.77 (H) 04/30/2016     Lab Results  Component Value Date   HGBA1C 7.3 (H) 04/12/2016    Urinalysis    Component Value Date/Time   COLORURINE RED (A) 04/12/2016 1123   APPEARANCEUR CLOUDY (A) 04/12/2016 1123   LABSPEC 1.014 04/12/2016 1123   PHURINE 5.0 04/12/2016 1123   GLUCOSEU NEGATIVE 04/12/2016 1123   HGBUR 3+  (A) 04/12/2016 1123   BILIRUBINUR NEGATIVE 04/12/2016 1123   KETONESUR NEGATIVE 04/12/2016 1123   PROTEINUR 100 (A) 04/12/2016 1123   NITRITE NEGATIVE 04/12/2016 1123   LEUKOCYTESUR 3+ (A) 04/12/2016 1123    Pertinent Imaging: CLINICAL DATA:  confusion and weakness since 6am. Family reports patient complaining of abdominal pain yesterday and taking pain medication and becoming sleepier than usual. Patient feels hot to the touch. Patient is extremely weak and confused. This RN had difficulty getting patient from the vehicle. Hx of cholecystectomy and inguinal hernia repair  EXAM: CT ABDOMEN AND PELVIS WITHOUT CONTRAST  TECHNIQUE: Multidetector CT imaging of the abdomen and pelvis was performed following the standard protocol without IV contrast.  COMPARISON:  04/30/2009  FINDINGS: Lower chest: Patchy subsegmental atelectasis or scarring posteriorly in the visualized lung bases left greater than right. Coronary calcifications. Four-chamber cardiac enlargement. Blood pool less dense than the interventricular septum suggesting anemia.  Hepatobiliary: No focal liver abnormality is seen. Status post cholecystectomy. No biliary dilatation.  Pancreas: Mild diffuse atrophy without mass or ductal dilatation.  Spleen: Normal in size without focal abnormality.  Adrenals/Urinary Tract: Stable 15 mm left adrenal nodule. Moderate left hydronephrosis and marked ureterectasis down to the level of a 15 mm mid ureteral calculus. Foley catheter decompresses the urinary bladder. 8 mm calculus in the lumen of the urinary bladder. Right kidney and adrenal gland unremarkable.  Stomach/Bowel: Stomach, small bowel, and colon are nondilated. Scattered descending and sigmoid diverticula.  Vascular/Lymphatic: Moderate aortoiliac arterial calcifications. Right common iliac artery dilated 2.2 cm diameter, left also 2.2 cm.  Reproductive: Prostatic enlargement.  Foley catheter in  place.  Other: No ascites.  No free air.  Musculoskeletal: Degenerative disc disease L4-5 and L5-S1. Negative for fracture or dislocation.  IMPRESSION: 1. Obstructing 15 mm left ureteral calculus. Moderate left hydronephrosis and proximal ureterectasis. 2. 8 mm urinary bladder calculus. 3. Descending and sigmoid diverticulosis. 4. Cardiomegaly with coronary calcifications. 5. 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   Electronically Signed   By: D  Hassell M.D.   On: 04/12/2016 13:37  CT scan personally reviewed today.  Assessment & Plan:    1. Left ureteral stone Discuss definitive management for his obstructing 15 mm left mid ureteral stone Given the size of the stone and amount of stone fragmentation, I do not feel that ESWL   would be appropriate in this situation due to concern for steinstrasse as well suspected J hooking within the distal ureter due to prostatomegaly I have recommended proceeding to the operating room with ureteroscopy, laser lithotripsy, ureteral stent placement with possible nephrostomy tube removal Risk and benefits of this were discussed in detail including risk of bleeding, infection, damage to any structures, ureteral stricture formation, need for multiple procedures, failure of the procedure amongst others.  We will check a preop urine culture and double cover with antibiotics due to concern for recurrent urosepsis. Based on his anatomy, I do also have some concern for accessing the left UO given the size of his prostate gland. As such, I have consented the patient for use of the thulium laser (ablation/ hemostatic) to take down any intravesical prostate which may be obstructing the lobe as needed in order to access the ureter.  He understands that the entire gland will not be treated on this occasion but only as needed to achieve stone free ureter He will likely need cardiac clearance for his history of  CAD/ MI for surgery- Dr. Gollan - CULTURE, URINE COMPREHENSIVE  2. Benign prostatic hyperplasia with urinary retention As above Will consider outlet procedure followed by possible UDS down the road  3. Drug-induced constipation Aggressive bowel regimen recommended followed by maintenance with Colace  Schedule surgery as above  Mirella Gueye, MD  [facility] [address], [address] [place], [place] [address] [phone]  I spent 25 min with this patient of which greater than 50% was spent in counseling and coordination of care with the patient.   Records from recent inpatient admission were reviewed in detail.  

## 2016-05-11 NOTE — Anesthesia Procedure Notes (Signed)
Procedure Name: Intubation Performed by: Casey Burkitt Pre-anesthesia Checklist: Patient identified, Patient being monitored, Timeout performed, Emergency Drugs available and Suction available Patient Re-evaluated:Patient Re-evaluated prior to inductionOxygen Delivery Method: Circle system utilized Preoxygenation: Pre-oxygenation with 100% oxygen Intubation Type: IV induction Ventilation: Mask ventilation without difficulty and Oral airway inserted - appropriate to patient size Laryngoscope Size: Mac, 3 and McGraph Grade View: Grade IV Tube type: Oral Tube size: 7.5 mm Number of attempts: 1 Airway Equipment and Method: Bougie stylet and Video-laryngoscopy Placement Confirmation: ETT inserted through vocal cords under direct vision,  positive ETCO2 and breath sounds checked- equal and bilateral Secured at: 21 cm Tube secured with: Tape Dental Injury: Teeth and Oropharynx as per pre-operative assessment  Difficulty Due To: Difficult Airway- due to anterior larynx Future Recommendations: Recommend- induction with short-acting agent, and alternative techniques readily available Comments: First attempt with MAC 3  Blade, grade 4 view. Second attempt with Mcgraph 3 with grade 1 view, passed bougie stylet through cords. Advanced ETT with +BBS, + ETCO2.

## 2016-05-11 NOTE — OR Nursing (Signed)
Pt insisted on being discharged with a leg bag. He and his wife state he wears a leg bag day and night. Instructed that it would be better for him to wear larger bag at night. Pt states he does not want the larger bag at all.Leg bag placed on pt per pt request. Pink colored urine draining.

## 2016-05-11 NOTE — Discharge Instructions (Signed)
You have a ureteral stent in place.  This is a tube that extends from your kidney to your bladder.  This may cause urinary bleeding, burning with urination, and urinary frequency.  Please call our office or present to the ED if you develop fevers >101 or pain which is not able to be controlled with oral pain medications.  You may be given either Flomax and/ or ditropan to help with bladder spasms and stent pain in addition to pain medications.    Routine Foley care.  [facility] [address] [place], [place] [address] [phone]  AMBULATORY SURGERY  DISCHARGE INSTRUCTIONS   1) The drugs that you were given will stay in your system until tomorrow so for the next 24 hours you should not:  A) Drive an automobile B) Make any legal decisions C) Drink any alcoholic beverage   2) You may resume regular meals tomorrow.  Today it is better to start with liquids and gradually work up to solid foods.  You may eat anything you prefer, but it is better to start with liquids, then soup and crackers, and gradually work up to solid foods.   3) Please notify your doctor immediately if you have any unusual bleeding, trouble breathing, redness and pain at the surgery site, drainage, fever, or pain not relieved by medication.    4) Additional Instructions:        Please contact your physician with any problems or Same Day Surgery at [phone], Monday through Friday 6 am to 4 pm, or [place] at [facility] number at [phone].

## 2016-05-11 NOTE — Transfer of Care (Signed)
Immediate Anesthesia Transfer of Care Note  Patient: [name]  Procedure(s) Performed: Procedure(s): CYSTOSCOPY/URETEROSCOPY/HOLMIUM LASER/STENT PLACEMENT (Left) THULIUM LASER ABLATION OF PROSTATE (N/A) CYSTOSCOPY WITH LITHOLAPAXY (N/A) NEPHROSTOMY tude removal  Patient Location: PACU  Anesthesia Type:General  Level of Consciousness: awake, alert  and responds to stimulation  Airway & Oxygen Therapy: Patient Spontanous Breathing and Patient connected to face mask oxygen  Post-op Assessment: Report given to RN and Post -op Vital signs reviewed and stable  Post vital signs: Reviewed and stable  Last Vitals:  Vitals:   05/11/16 1033 05/11/16 1035  BP: (!) 141/65 (!) 141/65  Pulse: 72   Resp:  (!) 9  Temp: 36.8 C     Last Pain:  Vitals:   05/11/16 0618  TempSrc: Oral  PainSc: 1          Complications: No apparent anesthesia complications

## 2016-05-11 NOTE — OR Nursing (Signed)
Thulium laser used. Reps present and operated machine. Treatment settings 100/150 watts. Gland vol:80cc. Lasing time 23:31. Total energy [id number]. Start time 08:05 end time 10:21. 200 watt serial number. [id number] S/U part#800 plug #[id number] Energy 176,052.

## 2016-05-11 NOTE — Anesthesia Preprocedure Evaluation (Signed)
Anesthesia Evaluation  Patient identified by MRN, date of birth, ID band Patient awake    Reviewed: Allergy & Precautions, NPO status , Patient's Chart, lab work & pertinent test results  Airway Mallampati: III       Dental  (+) Teeth Intact   Pulmonary sleep apnea , former smoker,    breath sounds clear to auscultation       Cardiovascular Exercise Tolerance: Poor hypertension, Pt. on medications + CAD, + Peripheral Vascular Disease and +CHF   Rhythm:Regular Rate:Normal     Neuro/Psych negative neurological ROS  negative psych ROS   GI/Hepatic negative GI ROS, Neg liver ROS,   Endo/Other  diabetes, Type 2, Oral Hypoglycemic Agents  Renal/GU      Musculoskeletal   Abdominal Normal abdominal exam  (+)   Peds  Hematology  (+) anemia ,   Anesthesia Other Findings   Reproductive/Obstetrics                             Anesthesia Physical Anesthesia Plan  ASA: III  Anesthesia Plan: General   Post-op Pain Management:    Induction: Intravenous  Airway Management Planned: Oral ETT  Additional Equipment:   Intra-op Plan:   Post-operative Plan: Extubation in OR  Informed Consent: I have reviewed the patients History and Physical, chart, labs and discussed the procedure including the risks, benefits and alternatives for the proposed anesthesia with the patient or authorized representative who has indicated his/her understanding and acceptance.     Plan Discussed with: CRNA  Anesthesia Plan Comments:         Anesthesia Quick Evaluation

## 2016-05-11 NOTE — Op Note (Signed)
Date of procedure: 05/11/16  Preoperative diagnosis:  1. Left distal ureteral stone 2. History of urosepsis status post left percutaneous nephrostomy tube placement 3. Chronic urinary retention secondary BPH 4. Bladder stone   Postoperative diagnosis:  1. Same as above   Procedure: 1. Cystoscopy 2. Left ureteroscopy 3. Left retrograde pyelogram 4. Left laser lithotripsy 5. Left ureteral stent placement 6. Removal of left percutaneous nephrostomy tube 7. Laser ablation of prostate (thulium) 8. Cystolitholapaxy  Surgeon: Vanna ScotlandAshley Dellis Voght, MD  Anesthesia: General  Complications: None  Intraoperative findings: Large 1.6 cm distal ureteral stone treated, difficult to access given anatomy and J hooking of distal ureter. Small bladder stone treated. Prostatic fossa with significant bilobar coaptation and bilateral intravesical lateral lobes with elevated bladder neck.  EBL: Minimal  Specimens: None  Drains: 20 French two-way Foley catheter, 6 x 26 French double-J ureteral stent on left  Indication: [name] is a 82 y.o. patient presented with urosepsis from a 1.6 cm left distal ureteral stone who underwent emergent left percutaneous nephrostomy tube placement returns today for definitive management of his stone. Given his history of BPH and enlarged prostate, he was consented today in addition to his ureteroscopy for laser ablation of his prostate tissue in order to help access the left distal ureter. Additionally, he is a small bladder stone which we will treat..  After reviewing the management options for treatment, he elected to proceed with the above surgical procedure(s). We have discussed the potential benefits and risks of the procedure, side effects of the proposed treatment, the likelihood of the patient achieving the goals of the procedure, and any potential problems that might occur during the procedure or recuperation. Informed consent has been obtained.  Description of  procedure:  The patient was taken to the operating room and general anesthesia was induced.  His indwelling Foley catheter was removed. The patient was placed in the dorsal lithotomy position, prepped and draped in the usual sterile fashion, and preoperative antibiotics were administered. A preoperative time-out was performed.   A 21 French rigid scope was advanced per urethra into the bladder. Of note, he had a significantly enlarged prostate with severe bilobar coaptation and elevated bladder neck with intravesical protrusion of his lateral lobes without a significant median lobe component. The trigone was distorted somewhat due to the bladder neck elevation. The bladder was trabeculated. There is no other masses or lesions within the bladder. There was a relatively small bladder stone approximately 1 cm in size. Ultimately, I was able to identify the left ureteral orifice.  Fluoroscopy, the large left distal ureteral stone could be seen easily several centimeters away from the UO. I tempted to cannulate the distal ureter using several different wires including an angled Glidewire and a sensor wire and ultimately was unsuccessful in traversing the angulation secondary to J hooking of the distal ureter from his enlarged prostate. A retrograde pyelogram just within the UO confirmed this anatomy. I then advanced a 4.5 French semirigid ureteroscope just within the distal ureter and was able to somewhat straighten out the ureter and ultimately was successful in coiling in angled Glidewire just distal to the stone. I then advanced the scope up to this level where the stone could be seen. Under direct vision, I was able to ultimately get a sensor wire by the stone up to the level of the kidney. Another retrograde pyelogram was performed confirming the appropriate location of the stone within the collecting system as well as the nephrostomy tube in the  lower pole calyx.  No extravasation or ureteral injury was  appreciated. This point in time, 273  laser fiber was used using settings of 0.8 J and 10 Hz to fragment the stone into very small particles. Given difficulty manipulating the scope within the distal ureter, I did not attempt to extract the stone fragments with a basket to avoid any additional ureteral trauma. He was eventually able to pass the scope beyond the level of stone up to the mid ureter without difficulty and no additional stone fragment was appreciated. Once the fragment was adequately fragmented, the scope was backed down the length of the ureter. Given the large amount of stone debris, I do have some concern that he may need to return for a staged procedure to clear the residual stone fragment after ureteral dilation with a stent.  Finally, the safety wire was backloaded over a rigid cystoscope. A 6 x 26 French double J ureteral stent was advanced up to level of the renal pelvis under fluoroscopic guidance. The wire was partially drawn until full coil was noted within the renal pelvis. The wire was then fully withdrawn and a full coil was noted within the bladder.   Next, the cystoscope was exchanged for a 26 French resectoscope for the purpose of continuous flow. The bladder stone was identified and using the same laser fiber, the stone was fragmented into roughly 2 smaller pieces. Each of these pieces were then evacuated through the sheath of the resectoscope.  Finally, given his history of BPH and chronic urinary retention, I did go ahead and use the Thulium laser today to take down his elevated bladder neck open a good channel. This was achieved by making 2 incisions at the bladder neck at the 5:00 and 6 clock positions meeting in the midline just proximal to the verumontanum.. The intervening tissue was vaporized. Each of the lateral lobes was then vaporized to degree in order to open up an adequate channel.  Care was taken to avoid any vaporization or manipulation beyond the verumontanum.   Initial laser settings were 100 W which was later increased to 150 W. Once a satisfactory channel was created, the scope was advanced back up into the bladder and trigone was carefully reinspected. Bilateral ureteral orifices were clearly identified with the stent emanating from the left. Hemostasis was excellent. The resectoscope was then removed. A 20 French two-way Foley catheter was then advanced into the bladder and late peak urine was drained. 30 cc was instilled into the balloon. The patient was then cleaned and dried, repositioned the supine position, reversed from anesthesia, taken to the PACU in stable condition.  [place]: Patient will follow-up in approximately 2 weeks. Given the amount of stone debris within the left distal ureter, we'll perform a CT stone protocol just prior to his follow-up to assess the overall residual stone burden. This may or may not necessitate a staged ureteroscopic procedure. If there is minimal fragment, we'll proceed with cystoscopy, stent removal in the office.  [name], M.D.

## 2016-05-11 NOTE — Anesthesia Postprocedure Evaluation (Signed)
Anesthesia Post Note  Patient: [name]  Procedure(s) Performed: Procedure(s) (LRB): CYSTOSCOPY/URETEROSCOPY/HOLMIUM LASER/STENT PLACEMENT (Left) THULIUM LASER ABLATION OF PROSTATE (N/A) CYSTOSCOPY WITH LITHOLAPAXY (N/A) NEPHROSTOMY tude removal  Patient location during evaluation: PACU Anesthesia Type: General Level of consciousness: awake Pain management: satisfactory to patient Vital Signs Assessment: post-procedure vital signs reviewed and stable Respiratory status: spontaneous breathing Cardiovascular status: stable Anesthetic complications: no    Last Vitals:  Vitals:   05/11/16 1033 05/11/16 1035  BP: (!) 141/65 (!) 141/65  Pulse: 72   Resp:  (!) 9  Temp: 36.8 C     Last Pain:  Vitals:   05/11/16 1033  TempSrc:   PainSc: 7                  VAN STAVEREN,[name]

## 2016-05-11 NOTE — Interval H&P Note (Signed)
History and Physical Interval Note:  05/11/2016 7:22 AM  [name]  has presented today for surgery, with the diagnosis of LEFT URETERAL STONE  The various methods of treatment have been discussed with the patient and family. After consideration of risks, benefits and other options for treatment, the patient has consented to  Procedure(s): CYSTOSCOPY/URETEROSCOPY/HOLMIUM LASER/STENT PLACEMENT (Left) THULIUM LASER ABLATION OF PROSTATE (N/A) CYSTOSCOPY WITH LITHOLAPAXY (N/A) as a surgical intervention .  The patient's history has been reviewed, patient examined, no change in status, stable for surgery.  I have reviewed the patient's chart and labs.  Questions were answered to the patient's satisfaction.     [name]

## 2016-05-12 ENCOUNTER — Telehealth: Payer: Self-pay

## 2016-05-12 NOTE — Telephone Encounter (Signed)
The pt called complaining of strong sensation to urinate and leakage around the catheter (bladder spasm). He informed me that you treated him before with Myrbetriq 25MG  . Dr. Apolinar Junes gave the clearance and pt was notified.

## 2016-05-13 NOTE — Telephone Encounter (Signed)
Patient's home health nurse called and said that he is having bladder spasms, and having mild anxiety and would like something to take for that.  Please contact the patient    michelle

## 2016-05-13 NOTE — Telephone Encounter (Signed)
He needs to contact his PCP for anxiety.   He has mybetriq for bladder spasms.    [name], MD

## 2016-05-13 NOTE — Telephone Encounter (Signed)
Tried to contact patient line was busy, could not leave vmail/SW

## 2016-05-14 NOTE — Telephone Encounter (Signed)
Patient called back stating that no one has called him back, sarah called the patient back yesterday and no answer and no voicemail. He also wants to know if he needs to continue to take the cipro? Please call him back to discuss.  [name]

## 2016-05-18 ENCOUNTER — Ambulatory Visit
Admission: RE | Admit: 2016-05-18 | Discharge: 2016-05-18 | Disposition: A | Discharge status: Home / Self Care | Payer: Medicare Other | Source: Ambulatory Visit | Attending: Urology | Admitting: Urology

## 2016-05-18 DIAGNOSIS — N200 Calculus of kidney: Secondary | ICD-10-CM | POA: Insufficient documentation

## 2016-05-18 DIAGNOSIS — N401 Enlarged prostate with lower urinary tract symptoms: Secondary | ICD-10-CM | POA: Diagnosis not present

## 2016-05-18 DIAGNOSIS — I700 Atherosclerosis of aorta: Secondary | ICD-10-CM | POA: Insufficient documentation

## 2016-05-18 DIAGNOSIS — I2510 Atherosclerotic heart disease of native coronary artery without angina pectoris: Secondary | ICD-10-CM | POA: Diagnosis not present

## 2016-05-18 DIAGNOSIS — K5730 Diverticulosis of large intestine without perforation or abscess without bleeding: Secondary | ICD-10-CM | POA: Diagnosis not present

## 2016-05-27 ENCOUNTER — Telehealth: Payer: Self-pay | Admitting: Radiology

## 2016-05-27 ENCOUNTER — Encounter: Payer: Self-pay | Admitting: Urology

## 2016-05-27 ENCOUNTER — Other Ambulatory Visit: Payer: Self-pay | Admitting: Radiology

## 2016-05-27 ENCOUNTER — Ambulatory Visit (INDEPENDENT_AMBULATORY_CARE_PROVIDER_SITE_OTHER): Payer: Medicare Other | Admitting: Urology

## 2016-05-27 VITALS — BP 111/73 | HR 74 | Ht 65.0 in | Wt 156.0 lb

## 2016-05-27 DIAGNOSIS — N201 Calculus of ureter: Secondary | ICD-10-CM

## 2016-05-27 DIAGNOSIS — R338 Other retention of urine: Secondary | ICD-10-CM

## 2016-05-27 DIAGNOSIS — R339 Retention of urine, unspecified: Secondary | ICD-10-CM

## 2016-05-27 DIAGNOSIS — N401 Enlarged prostate with lower urinary tract symptoms: Secondary | ICD-10-CM

## 2016-05-27 MED ORDER — CIPROFLOXACIN HCL 500 MG PO TABS: 500.0000 mg | ORAL_TABLET | Freq: Two times a day (BID) | ORAL | 0 refills | Status: DC

## 2016-05-27 NOTE — Progress Notes (Signed)
05/27/2016 2:58 PM   [name] 01/02/1928 [id number]  Referring provider: Lauro RegulusMarshall W Anderson, MD [address] [facility] [place] [facility] [place], [place] [id number]  Chief Complaint  Patient presents with  . Pre-op Exam    HPI: 82 year old male with chronic urinary retention managed by an indwelling Foley catheter recently admitted to the hospital after hospital  for severe sepsis secondary to an obstructing 16 mm left mid ureteral stone.  He underwent urgent left ureteral percutaneous nephrostomy tube placement on 04/12/2016 and was admitted to the ICU for sequela of urosepsis.  His urine ultimately grew Klebsiella and his antibiotics were transitioned to Cipro at the time of discharge.  He also developed acute on chronic kidney injury which was improving slowly at the time of discharge back to his baseline of 1.7.  He returned to the operating room on 05/11/2016 for left ureteroscopy, laser lithotripsy, stent placement with a nephrostomy tube removal, and laser ablation of his prostate with the Thulium laser to help create channel to access his left ureter.    Interval follow-up CT stone protocol on 05/19/2016 showed fairly significant but fragmented left distal ureteral stone debris along the stent and small amount of refluxed left lower pole stone debris.  His Foley catheter remains in place.  He returns today to discuss options for his residual stone burden.  He has no significant complaints today. Again, he is anxious to have his Foley catheter removed.  PMH: Past Medical History:  Diagnosis Date  . Anemia   . BPH (benign prostatic hypertrophy)   . Cardiomyopathy (HCC)   . Coronary artery disease   . Diverticulosis   . Essential hypertension, benign   . GI bleed 2015  . History of kidney stones   . Hypersomnia with sleep apnea, unspecified   . MI (myocardial infarction) 1996  . Occasional tremors   . Other and unspecified hyperlipidemia   . Pure  hypercholesterolemia   . Sleep apnea    unable to use C-PAP  . Type II or unspecified type diabetes mellitus without mention of complication, uncontrolled     Surgical History: Past Surgical History:  Procedure Laterality Date  . CARDIAC CATHETERIZATION    . CHOLECYSTECTOMY  1994  . [place]  [place]  . CYSTOSCOPY WITH LITHOLAPAXY N/A 05/11/2016   Procedure: CYSTOSCOPY WITH LITHOLAPAXY;  Surgeon: Vanna ScotlandAshley Criss Pallone, MD;  Location: [facility] ORS;  Service: Urology;  Laterality: N/A;  . CYSTOSCOPY/URETEROSCOPY/HOLMIUM LASER/STENT PLACEMENT Left 05/11/2016   Procedure: CYSTOSCOPY/URETEROSCOPY/HOLMIUM LASER/STENT PLACEMENT;  Surgeon: Vanna ScotlandAshley Menna Abeln, MD;  Location: [facility] ORS;  Service: Urology;  Laterality: Left;  . ESOPHAGOGASTRODUODENOSCOPY (EGD) WITH PROPOFOL Left 11/23/2015   Procedure: ESOPHAGOGASTRODUODENOSCOPY (EGD) WITH PROPOFOL;  Surgeon: Colette RibasEdward L Barnes, MD;  [facility];  Service: Endoscopy;  Laterality: Left;  . EYE SURGERY Bilateral    Cataract Extraction with IOL   . FLEXIBLE SIGMOIDOSCOPY    . HERNIA REPAIR Left    Inguinal Hernia Repair  . inguinal hernia repair     left inguinal   . IR GENERIC HISTORICAL  04/12/2016   IR NEPHROSTOMY PLACEMENT LEFT 04/12/2016 Simonne ComeJohn Watts, MD [facility]-INTERV RAD  . KNEE ARTHROSCOPY Left   . NEPHROSTOMY  05/11/2016   Procedure: NEPHROSTOMY tude removal;  Surgeon: Vanna ScotlandAshley Daisuke Bailey, MD;  Location: [facility] ORS;  Service: Urology;;  . TONSILLECTOMY      Home Medications:    Medication List       Accurate as of 05/27/16  2:58 PM. Always use  your most recent med list.          acetaminophen 325 MG tablet Commonly known as:  TYLENOL Take 2 tablets (650 mg total) by mouth every 6 (six) hours as needed for mild pain (or Fever >/= 101).   ciprofloxacin 500 MG tablet Commonly known as:  CIPRO Take 1 tablet (500 mg total) by mouth 2 (two) times daily.   Co Q 10 10 MG Caps Take 1 tablet by mouth daily.     colchicine-probenecid 0.5-500 MG tablet Take 1 tablet by mouth daily.   docusate sodium 100 MG capsule Commonly known as:  COLACE Take 1 capsule (100 mg total) by mouth 2 (two) times daily as needed (take to keep stool soft.).   ferrous sulfate 325 (65 FE) MG tablet Take 325 mg by mouth daily with breakfast.   finasteride 5 MG tablet Commonly known as:  PROSCAR Take 1 tablet (5 mg total) by mouth daily.   furosemide 40 MG tablet Commonly known as:  LASIX Take 40-80 mg by mouth 2 (two) times daily. Take 2 tablets every am & 1 tablet every pm.   glimepiride 1 MG tablet Commonly known as:  AMARYL Take 1 mg by mouth daily with breakfast.   insulin aspart 100 UNIT/ML injection Commonly known as:  novoLOG Inject 3 Units into the skin 3 (three) times daily with meals.   LANTUS SOLOSTAR 100 UNIT/ML Solostar Pen Generic drug:  Insulin Glargine Inject 12 Units into the skin daily.   losartan 100 MG tablet Commonly known as:  COZAAR Take 100 mg by mouth daily.   Magnesium 400 MG Tabs Take 400 mg by mouth every other day.   metolazone 5 MG tablet Commonly known as:  ZAROXOLYN Take 5 mg by mouth as needed. Reported on 12/15/2015   potassium chloride 20 MEQ packet Commonly known as:  KLOR-CON Take 20 mEq by mouth daily.   tamsulosin 0.4 MG Caps capsule Commonly known as:  FLOMAX Take 1 capsule (0.4 mg total) by mouth daily.       Allergies:  Allergies  Allergen Reactions  . Exforge [Amlodipine Besylate-Valsartan] Swelling and Other (See Comments)    Pt states that he is unable to swallow.    . Lisinopril Swelling and Other (See Comments)    Pt states that he is unable to swallow.    . Niacin And Related Swelling and Other (See Comments)    Pt states that he is unable to swallow.      Family History: Family History  Problem Relation Age of Onset  . Rheum arthritis Mother   . Alzheimer's disease Father     Social History:  reports that he has quit smoking. His  smoking use included Cigarettes. He has a 50.00 pack-year smoking history. He has never used smokeless tobacco. He reports that he does not drink alcohol or use drugs.  ROS: UROLOGY Frequent Urination?: No Hard to postpone urination?: No Burning/pain with urination?: No Get up at night to urinate?: No Leakage of urine?: No Urine stream starts and stops?: No Trouble starting stream?: No Do you have to strain to urinate?: No Blood in urine?: No Urinary tract infection?: No Sexually transmitted disease?: No Injury to kidneys or bladder?: No Painful intercourse?: No Weak stream?: No Erection problems?: No Penile pain?: No  Gastrointestinal Nausea?: No Vomiting?: No Indigestion/heartburn?: No Diarrhea?: No Constipation?: No  Constitutional Fever: No Night sweats?: No Weight loss?: No Fatigue?: No  Skin Skin rash/lesions?: No Itching?: No  Eyes  Blurred vision?: No Double vision?: No  Ears/Nose/Throat Sore throat?: No Sinus problems?: No  Hematologic/Lymphatic Swollen glands?: No Easy bruising?: No  Cardiovascular Leg swelling?: No Chest pain?: No  Respiratory Cough?: No Shortness of breath?: No  Endocrine Excessive thirst?: No  Musculoskeletal Back pain?: No Joint pain?: No  Neurological Headaches?: No Dizziness?: No  Psychologic Depression?: No Anxiety?: No  Physical Exam: BP 111/73   Pulse 74   Ht 5\' 5"  (1.651 m)   Wt 156 lb (70.8 kg)   BMI 25.96 kg/m   Constitutional:  Alert and oriented, No acute distress.  Accompanied today by his wife.  Elderly. HEENT: [place] AT, moist mucus membranes.  Trachea midline, no masses. Cardiovascular: RRR. Respiratory: Normal respiratory effort, no increased work of breathing. CTAB. GI: Abdomen is soft, nontender, nondistended, no abdominal masses GU: Foley catheter draining clear yellow urine. Skin: No rashes, bruises or suspicious lesions. Neurologic: Grossly intact, no focal deficits, moving all 4  extremities. Psychiatric: Normal mood and affect.  Laboratory Data: Lab Results  Component Value Date   WBC 11.0 (H) 04/16/2016   HGB 9.7 (L) 04/16/2016   HCT 29.1 (L) 04/16/2016   MCV 80.9 04/16/2016   PLT 151 04/16/2016    Lab Results  Component Value Date   CREATININE 2.77 (H) 04/30/2016     Lab Results  Component Value Date   HGBA1C 7.3 (H) 04/12/2016    Urinalysis    Component Value Date/Time   COLORURINE RED (A) 04/12/2016 1123   APPEARANCEUR CLOUDY (A) 04/12/2016 1123   LABSPEC 1.014 04/12/2016 1123   PHURINE 5.0 04/12/2016 1123   GLUCOSEU NEGATIVE 04/12/2016 1123   HGBUR 3+ (A) 04/12/2016 1123   BILIRUBINUR NEGATIVE 04/12/2016 1123   KETONESUR NEGATIVE 04/12/2016 1123   PROTEINUR 100 (A) 04/12/2016 1123   NITRITE NEGATIVE 04/12/2016 1123   LEUKOCYTESUR 3+ (A) 04/12/2016 1123    Pertinent Imaging: CLINICAL DATA:  Crampy abdominal pain with urinary urgency for 1 week. History of left ureteral stent and Foley catheter.  EXAM: CT ABDOMEN AND PELVIS WITHOUT CONTRAST  TECHNIQUE: Multidetector CT imaging of the abdomen and pelvis was performed following the standard protocol without IV contrast.  COMPARISON:  Abdominal pelvic CT 04/12/2016.  And 04/30/2009  FINDINGS: Lower chest: Interval improved aeration of the lung bases with mild residual right lower lobe atelectasis or scarring. There is no confluent airspace opacity, pleural or pericardial effusion. Mild aortic and coronary artery atherosclerosis noted.  Hepatobiliary: The liver appears unremarkable as imaged in the noncontrast state. No significant biliary dilatation status post cholecystectomy.  Pancreas: Atrophied without focal abnormality or surrounding inflammatory change.  Spleen: Normal in size without focal abnormality.  Adrenals/Urinary Tract: There is a stable 14 mm left adrenal nodule on image 13 from 2010, consistent with an adenoma. The right adrenal gland appears  normal. The right kidney appears normal. Double-J left ureteral stent is well positioned. There are small stone fragments along the distal end of the stent versus calcification of the stent walls. The left renal collecting system is decompressed with improved perinephric soft tissue stranding. There are small stone fragments in the lower pole calices. Foley catheter remains in place. The bladder is decompressed. There are small stone fragments within the bladder.  Stomach/Bowel: The stomach, small bowel and appendix demonstrate no significant findings. There are diverticular changes throughout the colon. There is mild sigmoid colon wall thickening and surrounding soft tissue stranding which could indicate mild diverticulitis. No extraluminal fluid or definite air collection seen in this  area.  Vascular/Lymphatic: There are no enlarged abdominal or pelvic lymph nodes. Extensive aortic and branch vessel atherosclerosis. There is stable mild dilatation of both common iliac arteries to 2.3 cm (coronal image 49).  Reproductive: Stable marked enlargement of the prostate gland, measuring up to 7.0 x 6.3 cm transverse. The seminal vesicles appear unremarkable.  Other: Stable asymmetric fat in the left inguinal canal. No herniated bowel or ascites.  Musculoskeletal: No acute or significant osseous findings. Stable lower lumbar spondylosis.  IMPRESSION: 1. Double-J left ureteral stent appears well positioned. There is no residual hydronephrosis. However, there are multiple stone fragments within the left lower pole calices, bladder and distal left ureter (versus stent encrustation). 2. The right kidney and collecting system appear unremarkable. 3. Extensive colonic diverticulosis with wall thickening and mild surrounding soft tissue stranding in the sigmoid region, suspicious for mild diverticulitis. 4. Diffuse atherosclerosis. 5. Prostatomegaly.   Electronically Signed    By: Carey Bullocks M.D.   On: 05/19/2016 09:40  CT scan personally reviewed today.  Assessment & Plan:    1. Left ureteral stone Recommend return to the OR for second stage left ureteroscopy to clear his residual left distal ureteral stone burden. He has been for agreeable to this plan. They understand the risks and benefits. Plan for returned OR. Repeat preop urine culture today, we'll go ahead and treat with Cipro twice a day 3 days prior to the procedure given his history of profound urosepsis in the recent past. - CULTURE, URINE COMPREHENSIVE  2. Benign prostatic hyperplasia with urinary retention Status post totally and laser ablation of the prostate, however, only enough prostate was ablated to more easily accessible left ureter, unlikely to be a definitive outlet procedure I have recommended consideration of placement of a suprapubic tube at the time of upcoming ureteroscopy given his history of chronic retention. Once the stone burden has been adequately addressed and his stent removed, we can proceed with clamping trials as cause I voiding trials and he will have a suprapubic tube to fall back on a piece unable to void as highly suspected. We reviewed the risks and benefits of suprapubic tube in the past and he is previous to consider this. All his questions were answered today. He understands the risks of bleeding, infection, damage is running structures, need for tube replacement, urinary tract infections, and injury to surrounding structures including bowel.  Schedule surgery as above  [name], MD  [facility] [address] [place], [place] [phone]  I spent 25 min with this patient of which greater than 50% was spent in counseling and coordination of care with the patient.

## 2016-05-27 NOTE — Telephone Encounter (Signed)
Notified pt of surgery scheduled 05/31/16 with Dr Apolinar Junes, to arrive at pre-admit testing on 05/31/16 @12 :15 & be npo after mn except to take losartan with a sip of water. Pt voices understanding.

## 2016-05-28 ENCOUNTER — Inpatient Hospital Stay: Admission: RE | Admit: 2016-05-28 | Payer: Medicare Other | Source: Ambulatory Visit

## 2016-05-28 ENCOUNTER — Other Ambulatory Visit: Payer: Medicare Other

## 2016-05-30 MED ORDER — CEFAZOLIN SODIUM-DEXTROSE 2-4 GM/100ML-% IV SOLN
2.0000 g | INTRAVENOUS | Status: AC
  Administered 2016-05-31: 2 g via INTRAVENOUS

## 2016-05-30 MED ORDER — GENTAMICIN IN SALINE 1.6-0.9 MG/ML-% IV SOLN
80.0000 mg | INTRAVENOUS | Status: AC
  Administered 2016-05-31: 80 mg via INTRAVENOUS
  Filled 2016-05-30: qty 50

## 2016-05-31 ENCOUNTER — Encounter: Payer: Self-pay | Admitting: Anesthesiology

## 2016-05-31 ENCOUNTER — Ambulatory Visit: Payer: Medicare Other | Admitting: Registered Nurse

## 2016-05-31 ENCOUNTER — Encounter
Admission: RE | Disposition: A | Discharge status: Home / Self Care | Payer: Self-pay | Source: Ambulatory Visit | Attending: Urology

## 2016-05-31 ENCOUNTER — Ambulatory Visit
Admission: RE | Admit: 2016-05-31 | Discharge: 2016-05-31 | Disposition: A | Discharge status: Home / Self Care | Payer: Medicare Other | Source: Ambulatory Visit | Attending: Urology | Admitting: Urology

## 2016-05-31 DIAGNOSIS — N201 Calculus of ureter: Secondary | ICD-10-CM

## 2016-05-31 DIAGNOSIS — K5730 Diverticulosis of large intestine without perforation or abscess without bleeding: Secondary | ICD-10-CM | POA: Insufficient documentation

## 2016-05-31 DIAGNOSIS — N401 Enlarged prostate with lower urinary tract symptoms: Secondary | ICD-10-CM | POA: Diagnosis not present

## 2016-05-31 DIAGNOSIS — I429 Cardiomyopathy, unspecified: Secondary | ICD-10-CM | POA: Diagnosis not present

## 2016-05-31 DIAGNOSIS — Z87442 Personal history of urinary calculi: Secondary | ICD-10-CM | POA: Diagnosis not present

## 2016-05-31 DIAGNOSIS — N320 Bladder-neck obstruction: Secondary | ICD-10-CM | POA: Insufficient documentation

## 2016-05-31 DIAGNOSIS — I708 Atherosclerosis of other arteries: Secondary | ICD-10-CM | POA: Diagnosis not present

## 2016-05-31 DIAGNOSIS — E119 Type 2 diabetes mellitus without complications: Secondary | ICD-10-CM | POA: Insufficient documentation

## 2016-05-31 DIAGNOSIS — Z888 Allergy status to other drugs, medicaments and biological substances status: Secondary | ICD-10-CM | POA: Insufficient documentation

## 2016-05-31 DIAGNOSIS — I10 Essential (primary) hypertension: Secondary | ICD-10-CM | POA: Diagnosis not present

## 2016-05-31 DIAGNOSIS — Q638 Other specified congenital malformations of kidney: Secondary | ICD-10-CM | POA: Insufficient documentation

## 2016-05-31 DIAGNOSIS — Z87891 Personal history of nicotine dependence: Secondary | ICD-10-CM | POA: Insufficient documentation

## 2016-05-31 DIAGNOSIS — G4730 Sleep apnea, unspecified: Secondary | ICD-10-CM | POA: Insufficient documentation

## 2016-05-31 DIAGNOSIS — R339 Retention of urine, unspecified: Secondary | ICD-10-CM | POA: Diagnosis not present

## 2016-05-31 DIAGNOSIS — R251 Tremor, unspecified: Secondary | ICD-10-CM | POA: Diagnosis not present

## 2016-05-31 DIAGNOSIS — Z794 Long term (current) use of insulin: Secondary | ICD-10-CM | POA: Insufficient documentation

## 2016-05-31 DIAGNOSIS — E7800 Pure hypercholesterolemia, unspecified: Secondary | ICD-10-CM | POA: Diagnosis not present

## 2016-05-31 DIAGNOSIS — I2510 Atherosclerotic heart disease of native coronary artery without angina pectoris: Secondary | ICD-10-CM | POA: Diagnosis not present

## 2016-05-31 DIAGNOSIS — N132 Hydronephrosis with renal and ureteral calculous obstruction: Secondary | ICD-10-CM | POA: Insufficient documentation

## 2016-05-31 DIAGNOSIS — I252 Old myocardial infarction: Secondary | ICD-10-CM | POA: Diagnosis not present

## 2016-05-31 DIAGNOSIS — G4710 Hypersomnia, unspecified: Secondary | ICD-10-CM | POA: Diagnosis not present

## 2016-05-31 DIAGNOSIS — R338 Other retention of urine: Secondary | ICD-10-CM | POA: Diagnosis not present

## 2016-05-31 HISTORY — PX: INSERTION OF SUPRAPUBIC CATHETER: SHX5870

## 2016-05-31 HISTORY — PX: URETEROSCOPY: SHX842

## 2016-05-31 HISTORY — PX: CYSTOSCOPY W/ URETERAL STENT PLACEMENT: SHX1429

## 2016-05-31 LAB — CULTURE, URINE COMPREHENSIVE

## 2016-05-31 LAB — GLUCOSE, CAPILLARY
GLUCOSE-CAPILLARY: 193 mg/dL — AB (ref 65–99)
GLUCOSE-CAPILLARY: 189 mg/dL — AB (ref 65–99)

## 2016-05-31 SURGERY — CYSTOSCOPY, FLEXIBLE, WITH STENT REPLACEMENT
Anesthesia: General | Site: Perineum | Wound class: Clean Contaminated

## 2016-05-31 MED ORDER — CEFAZOLIN SODIUM-DEXTROSE 2-4 GM/100ML-% IV SOLN
INTRAVENOUS | Status: AC
  Filled 2016-05-31: qty 100

## 2016-05-31 MED ORDER — ONDANSETRON HCL 4 MG/2ML IJ SOLN: 4.0000 mg | Freq: Once | INTRAMUSCULAR | Status: DC | PRN

## 2016-05-31 MED ORDER — PHENYLEPHRINE HCL 10 MG/ML IJ SOLN
INTRAMUSCULAR | Status: DC | PRN
  Administered 2016-05-31 (×5): 100 ug via INTRAVENOUS

## 2016-05-31 MED ORDER — ONDANSETRON HCL 4 MG/2ML IJ SOLN
INTRAMUSCULAR | Status: DC | PRN
  Administered 2016-05-31: 4 mg via INTRAVENOUS

## 2016-05-31 MED ORDER — IOTHALAMATE MEGLUMINE 43 % IV SOLN
INTRAVENOUS | Status: DC | PRN
  Administered 2016-05-31: 45 mL via SURGICAL_CAVITY

## 2016-05-31 MED ORDER — HYDROCODONE-ACETAMINOPHEN 5-325 MG PO TABS: 1.0000 | ORAL_TABLET | Freq: Four times a day (QID) | ORAL | 0 refills | Status: DC | PRN

## 2016-05-31 MED ORDER — FENTANYL CITRATE (PF) 100 MCG/2ML IJ SOLN
25.0000 ug | INTRAMUSCULAR | Status: DC | PRN
Start: 2016-05-31 — End: 2016-05-31
  Administered 2016-05-31 (×2): 25 ug via INTRAVENOUS

## 2016-05-31 MED ORDER — PROPOFOL 10 MG/ML IV BOLUS
INTRAVENOUS | Status: DC | PRN
  Administered 2016-05-31: 100 mg via INTRAVENOUS

## 2016-05-31 MED ORDER — FENTANYL CITRATE (PF) 100 MCG/2ML IJ SOLN
INTRAMUSCULAR | Status: DC | PRN
Start: 2016-05-31 — End: 2016-05-31
  Administered 2016-05-31: 25 ug via INTRAVENOUS
  Administered 2016-05-31 (×2): 50 ug via INTRAVENOUS

## 2016-05-31 MED ORDER — FENTANYL CITRATE (PF) 100 MCG/2ML IJ SOLN
INTRAMUSCULAR | Status: AC
  Administered 2016-05-31: 25 ug via INTRAVENOUS
  Filled 2016-05-31: qty 2

## 2016-05-31 MED ORDER — LIDOCAINE HCL (PF) 1 % IJ SOLN
INTRAMUSCULAR | Status: AC
  Filled 2016-05-31: qty 30

## 2016-05-31 MED ORDER — LIDOCAINE 1% INJECTION FOR CIRCUMCISION
INJECTION | INTRAVENOUS | Status: DC | PRN
  Administered 2016-05-31: 10 mL via SUBCUTANEOUS

## 2016-05-31 MED ORDER — LIDOCAINE HCL (CARDIAC) 20 MG/ML IV SOLN
INTRAVENOUS | Status: DC | PRN
  Administered 2016-05-31: 60 mg via INTRAVENOUS

## 2016-05-31 MED ORDER — SODIUM CHLORIDE 0.9 % IV SOLN
INTRAVENOUS | Status: DC | PRN
  Administered 2016-05-31: 20 ug/min via INTRAVENOUS

## 2016-05-31 MED ORDER — EPHEDRINE SULFATE 50 MG/ML IJ SOLN
INTRAMUSCULAR | Status: DC | PRN
  Administered 2016-05-31 (×4): 5 mg via INTRAVENOUS

## 2016-05-31 MED ORDER — SODIUM CHLORIDE 0.9 % IV SOLN
INTRAVENOUS | Status: DC
  Administered 2016-05-31: 13:00:00 via INTRAVENOUS

## 2016-05-31 SURGICAL SUPPLY — 45 items
ADHESIVE MASTISOL STRL (MISCELLANEOUS) ×7 IMPLANT
BAG DRAIN CYSTO-URO LG1000N (MISCELLANEOUS) ×7 IMPLANT
BAG URINE DRAINAGE (UROLOGICAL SUPPLIES) ×7 IMPLANT
BASKET ZERO TIP 1.9FR (BASKET) IMPLANT
BLADE SURG 11 STRL SS SAFETY (MISCELLANEOUS) ×7 IMPLANT
CATH FOLEY 2WAY  5CC 16FR (CATHETERS) ×2
CATH URETL 5X70 OPEN END (CATHETERS) ×7 IMPLANT
CATH URTH 16FR FL 2W BLN LF (CATHETERS) ×5 IMPLANT
CNTNR SPEC 2.5X3XGRAD LEK (MISCELLANEOUS) ×5
CONRAY 43 FOR UROLOGY 50M (MISCELLANEOUS) ×7 IMPLANT
CONT SPEC 4OZ STER OR WHT (MISCELLANEOUS) ×2
CONTAINER SPEC 2.5X3XGRAD LEK (MISCELLANEOUS) ×5 IMPLANT
DRAPE UTILITY 15X26 TOWEL STRL (DRAPES) ×7 IMPLANT
DRSG TEGADERM 2-3/8X2-3/4 SM (GAUZE/BANDAGES/DRESSINGS) ×7 IMPLANT
FIBER LASER LITHO 273 (Laser) IMPLANT
GLOVE BIO SURGEON STRL SZ 6.5 (GLOVE) ×6 IMPLANT
GLOVE BIO SURGEON STRL SZ7 (GLOVE) ×7 IMPLANT
GLOVE BIO SURGEONS STRL SZ 6.5 (GLOVE) ×1
GOWN STRL REUS W/ TWL LRG LVL3 (GOWN DISPOSABLE) ×10 IMPLANT
GOWN STRL REUS W/ TWL LRG LVL4 (GOWN DISPOSABLE) ×10 IMPLANT
GOWN STRL REUS W/TWL LRG LVL3 (GOWN DISPOSABLE) ×4
GOWN STRL REUS W/TWL LRG LVL4 (GOWN DISPOSABLE) ×4
GUIDEWIRE SUPER STIFF (WIRE) IMPLANT
INFUSOR MANOMETER BAG 3000ML (MISCELLANEOUS) ×7 IMPLANT
INTRODUCER DILATOR DOUBLE (INTRODUCER) IMPLANT
KIT RM TURNOVER CYSTO AR (KITS) ×7 IMPLANT
NEEDLE HYPO 25X1 1.5 SAFETY (NEEDLE) ×7 IMPLANT
NEEDLE SPNL 18GX3.5 QUINCKE PK (NEEDLE) ×7 IMPLANT
PACK CYSTO AR (MISCELLANEOUS) ×7 IMPLANT
PAD PREP 24X41 OB/GYN DISP (PERSONAL CARE ITEMS) ×7 IMPLANT
SCRUB POVIDONE IODINE 4 OZ (MISCELLANEOUS) IMPLANT
SENSORWIRE 0.038 NOT ANGLED (WIRE) ×14
SET CYSTO W/LG BORE CLAMP LF (SET/KITS/TRAYS/PACK) ×7 IMPLANT
SHEATH URETERAL 12FRX35CM (MISCELLANEOUS) IMPLANT
SOL .9 NS 3000ML IRR  AL (IV SOLUTION) ×2
SOL .9 NS 3000ML IRR UROMATIC (IV SOLUTION) ×5 IMPLANT
SPONGE DRAIN TRACH 4X4 STRL 2S (GAUZE/BANDAGES/DRESSINGS) ×7 IMPLANT
STENT URET 6FRX24 CONTOUR (STENTS) IMPLANT
STENT URET 6FRX26 CONTOUR (STENTS) ×7 IMPLANT
SURGILUBE 2OZ TUBE FLIPTOP (MISCELLANEOUS) ×7 IMPLANT
SUT SILK 0 SH 30 (SUTURE) ×7 IMPLANT
SYRINGE 10CC LL (SYRINGE) ×14 IMPLANT
SYRINGE IRR TOOMEY STRL 70CC (SYRINGE) ×7 IMPLANT
WATER STERILE IRR 1000ML POUR (IV SOLUTION) ×7 IMPLANT
WIRE SENSOR 0.038 NOT ANGLED (WIRE) ×10 IMPLANT

## 2016-05-31 NOTE — Op Note (Signed)
Date of procedure: 05/31/16  Preoperative diagnosis:  1. History of urosepsis secondary to left obstructing ureteral stone 2. Left ureteral fragments 3. Bifurcated left collecting system 4. Chronic urinary retention secondary to outlet obstruction   Postoperative diagnosis:  1. Same as above   Procedure: 1. Left ureteroscopy 2. Left retrograde pyelogram 3. Left ureteral stent exchange 4. Placement of suprapubic tube catheter under cystoscopic guidance  Surgeon: [name], MD  Anesthesia: General  Complications: None  Intraoperative findings: Bifurcated left collecting system with bifurcation near the level of the iliacs. Each ureter was able to be scoped up to level of the iliac crest bilaterally. No residual stone burden within the distal ureter appreciated but with significant ureteral edema. SP tube placed without difficulty.  EBL: Minimal  Specimens: None  Drains: 6 x 26 French double-J ureteral stent on left, string left in place  Indication: [name] is a 82 y.o. patient with history of urosepsis secondary to the left obstructing ureteral stone who returns today for second look ureteroscopy to clear the residual stone burden seen on recent CT stone protocol. Additionally, placement of a suprapubic tube was discussed..  After reviewing the management options for treatment, he elected to proceed with the above surgical procedure(s). We have discussed the potential benefits and risks of the procedure, side effects of the proposed treatment, the likelihood of the patient achieving the goals of the procedure, and any potential problems that might occur during the procedure or recuperation. Informed consent has been obtained.  Description of procedure:  The patient was taken to the operating room and general anesthesia was induced.  The patient was placed in the dorsal lithotomy position, prepped and draped in the usual sterile fashion, and preoperative antibiotics were  administered. A preoperative time-out was performed.    [place] the scope was advanced per urethra into the bladder. Of note, the prostatic fossa still had a good amount of necrotic material from previous prostatic vaporization. Within the bladder, there was some bullous edema around the left distal ureteral stent. The coil was grasped at this point and brought to level of the urethral meatus. The stent was then cannulated using a sensor wire up to level of the upper pole calyx. The stent was removed and the wire was snapped in place. Reflux will 4.5 French semirigid ureteroscope was then advanced through the distal ureter alongside the wire through the UO and the ureter was carefully inspected along the way. I was able to advance the scope up to level of the bifurcation between the upper pole and lower pole ureters which are incompletely duplicated and join around the level of the iliacs. Each of the ureters were able to be scoped with no obvious residual stone fragment. The scope was then backed down the common distal component of the ureter carefully inspecting along the way. Care was taken to ensure that there was no residual stone fragments within the distal ureter. There was a significant amount of bullous edema and the ureter was widely dilated. There was no significant stone burden appreciated both direct visually and under fluoroscopic guidance. Due to the amount of edema, I did elect to replace the left ureteral stent. The previously placed wire within the upper pole moiety was backloaded over a rigid cystoscope. A 6 x 26 French double-J ureteral stent was then advanced up to level of the upper pole complex and the wire was partially drawn until full coil was noted within this moiety. The wire was fully withdrawn and a  full coil was noted within the bladder. The string was left on the stent. The bladder was then drained. The stent string was affixed to the patient's penis using Mastisol and Tegaderm. His  foreskin was reduced.  Next, under cystoscopic direct visualization, an 18-gauge needle was used to instill 10 cc of 1% lidocaine in the midline suprapubic area. The bladder was distended and was easily palpable. A spinal needle was then used to plan the suprapubic tube tract. When inserted, the needle could be seen within the bladder anteriorly. A small incision, approximately 1 cm was made transversely in the midline just above the pubic symphysis. A suprapubic tube metal trocar was then advanced through the subcutaneous tissues to the rectus muscle into the anterior bladder wall again under cystoscopic guidance. Once the trocar was within the bladder, the obturator was removed and a 16 French Foley catheter was slid through the sheath into the bladder. The balloon was then filled with 10 cc of sterile water. The sheath was then removed leaving the suprapubic tube in place. The cystoscope was then removed. The suprapubic tube was secured to the patient's anterior abdominal wall using silk 2. Drain sponge was then placed. The patient was then cleaned and dried, repositioned the supine position, reversed from anesthesia, taken to the PACU in stable condition.  Plan: Patient will remove his own stent on Monday next week. He'll follow up in approximate 1 month with a renal ultrasound prior to his visit. We will exchange his SP tube at that appointment. We will consider performing clamping trials at that time to see if he can void spontaneously. Findings were discussed with the patient's wife today.  Vanna ScotlandAshley Kiannah Grunow, M.D.

## 2016-05-31 NOTE — H&P (View-Only) (Signed)
05/27/2016 2:58 PM   [name] 01/02/1928 [id number]  Referring provider: Lauro RegulusMarshall W Anderson, MD [address] [facility] [place] [facility] [place], [place] [id number]  Chief Complaint  Patient presents with  . Pre-op Exam    HPI: 82 year old male with chronic urinary retention managed by an indwelling Foley catheter recently admitted to the hospital after hospital  for severe sepsis secondary to an obstructing 16 mm left mid ureteral stone.  He underwent urgent left ureteral percutaneous nephrostomy tube placement on 04/12/2016 and was admitted to the ICU for sequela of urosepsis.  His urine ultimately grew Klebsiella and his antibiotics were transitioned to Cipro at the time of discharge.  He also developed acute on chronic kidney injury which was improving slowly at the time of discharge back to his baseline of 1.7.  He returned to the operating room on 05/11/2016 for left ureteroscopy, laser lithotripsy, stent placement with a nephrostomy tube removal, and laser ablation of his prostate with the Thulium laser to help create channel to access his left ureter.    Interval follow-up CT stone protocol on 05/19/2016 showed fairly significant but fragmented left distal ureteral stone debris along the stent and small amount of refluxed left lower pole stone debris.  His Foley catheter remains in place.  He returns today to discuss options for his residual stone burden.  He has no significant complaints today. Again, he is anxious to have his Foley catheter removed.  PMH: Past Medical History:  Diagnosis Date  . Anemia   . BPH (benign prostatic hypertrophy)   . Cardiomyopathy (HCC)   . Coronary artery disease   . Diverticulosis   . Essential hypertension, benign   . GI bleed 2015  . History of kidney stones   . Hypersomnia with sleep apnea, unspecified   . MI (myocardial infarction) 1996  . Occasional tremors   . Other and unspecified hyperlipidemia   . Pure  hypercholesterolemia   . Sleep apnea    unable to use C-PAP  . Type II or unspecified type diabetes mellitus without mention of complication, uncontrolled     Surgical History: Past Surgical History:  Procedure Laterality Date  . CARDIAC CATHETERIZATION    . CHOLECYSTECTOMY  1994  . [place]  [place]  . CYSTOSCOPY WITH LITHOLAPAXY N/A 05/11/2016   Procedure: CYSTOSCOPY WITH LITHOLAPAXY;  Surgeon: Vanna ScotlandAshley Kenslie Abbruzzese, MD;  Location: [facility] ORS;  Service: Urology;  Laterality: N/A;  . CYSTOSCOPY/URETEROSCOPY/HOLMIUM LASER/STENT PLACEMENT Left 05/11/2016   Procedure: CYSTOSCOPY/URETEROSCOPY/HOLMIUM LASER/STENT PLACEMENT;  Surgeon: Vanna ScotlandAshley Vayda Dungee, MD;  Location: [facility] ORS;  Service: Urology;  Laterality: Left;  . ESOPHAGOGASTRODUODENOSCOPY (EGD) WITH PROPOFOL Left 11/23/2015   Procedure: ESOPHAGOGASTRODUODENOSCOPY (EGD) WITH PROPOFOL;  Surgeon: Colette RibasEdward L Barnes, MD;  [facility];  Service: Endoscopy;  Laterality: Left;  . EYE SURGERY Bilateral    Cataract Extraction with IOL   . FLEXIBLE SIGMOIDOSCOPY    . HERNIA REPAIR Left    Inguinal Hernia Repair  . inguinal hernia repair     left inguinal   . IR GENERIC HISTORICAL  04/12/2016   IR NEPHROSTOMY PLACEMENT LEFT 04/12/2016 Simonne ComeJohn Watts, MD [facility]-INTERV RAD  . KNEE ARTHROSCOPY Left   . NEPHROSTOMY  05/11/2016   Procedure: NEPHROSTOMY tude removal;  Surgeon: Vanna ScotlandAshley Delante Karapetyan, MD;  Location: [facility] ORS;  Service: Urology;;  . TONSILLECTOMY      Home Medications:    Medication List       Accurate as of 05/27/16  2:58 PM. Always use  your most recent med list.          acetaminophen 325 MG tablet Commonly known as:  TYLENOL Take 2 tablets (650 mg total) by mouth every 6 (six) hours as needed for mild pain (or Fever >/= 101).   ciprofloxacin 500 MG tablet Commonly known as:  CIPRO Take 1 tablet (500 mg total) by mouth 2 (two) times daily.   Co Q 10 10 MG Caps Take 1 tablet by mouth daily.     colchicine-probenecid 0.5-500 MG tablet Take 1 tablet by mouth daily.   docusate sodium 100 MG capsule Commonly known as:  COLACE Take 1 capsule (100 mg total) by mouth 2 (two) times daily as needed (take to keep stool soft.).   ferrous sulfate 325 (65 FE) MG tablet Take 325 mg by mouth daily with breakfast.   finasteride 5 MG tablet Commonly known as:  PROSCAR Take 1 tablet (5 mg total) by mouth daily.   furosemide 40 MG tablet Commonly known as:  LASIX Take 40-80 mg by mouth 2 (two) times daily. Take 2 tablets every am & 1 tablet every pm.   glimepiride 1 MG tablet Commonly known as:  AMARYL Take 1 mg by mouth daily with breakfast.   insulin aspart 100 UNIT/ML injection Commonly known as:  novoLOG Inject 3 Units into the skin 3 (three) times daily with meals.   LANTUS SOLOSTAR 100 UNIT/ML Solostar Pen Generic drug:  Insulin Glargine Inject 12 Units into the skin daily.   losartan 100 MG tablet Commonly known as:  COZAAR Take 100 mg by mouth daily.   Magnesium 400 MG Tabs Take 400 mg by mouth every other day.   metolazone 5 MG tablet Commonly known as:  ZAROXOLYN Take 5 mg by mouth as needed. Reported on 12/15/2015   potassium chloride 20 MEQ packet Commonly known as:  KLOR-CON Take 20 mEq by mouth daily.   tamsulosin 0.4 MG Caps capsule Commonly known as:  FLOMAX Take 1 capsule (0.4 mg total) by mouth daily.       Allergies:  Allergies  Allergen Reactions  . Exforge [Amlodipine Besylate-Valsartan] Swelling and Other (See Comments)    Pt states that he is unable to swallow.    . Lisinopril Swelling and Other (See Comments)    Pt states that he is unable to swallow.    . Niacin And Related Swelling and Other (See Comments)    Pt states that he is unable to swallow.      Family History: Family History  Problem Relation Age of Onset  . Rheum arthritis Mother   . Alzheimer's disease Father     Social History:  reports that he has quit smoking. His  smoking use included Cigarettes. He has a 50.00 pack-year smoking history. He has never used smokeless tobacco. He reports that he does not drink alcohol or use drugs.  ROS: UROLOGY Frequent Urination?: No Hard to postpone urination?: No Burning/pain with urination?: No Get up at night to urinate?: No Leakage of urine?: No Urine stream starts and stops?: No Trouble starting stream?: No Do you have to strain to urinate?: No Blood in urine?: No Urinary tract infection?: No Sexually transmitted disease?: No Injury to kidneys or bladder?: No Painful intercourse?: No Weak stream?: No Erection problems?: No Penile pain?: No  Gastrointestinal Nausea?: No Vomiting?: No Indigestion/heartburn?: No Diarrhea?: No Constipation?: No  Constitutional Fever: No Night sweats?: No Weight loss?: No Fatigue?: No  Skin Skin rash/lesions?: No Itching?: No  Eyes  Blurred vision?: No Double vision?: No  Ears/Nose/Throat Sore throat?: No Sinus problems?: No  Hematologic/Lymphatic Swollen glands?: No Easy bruising?: No  Cardiovascular Leg swelling?: No Chest pain?: No  Respiratory Cough?: No Shortness of breath?: No  Endocrine Excessive thirst?: No  Musculoskeletal Back pain?: No Joint pain?: No  Neurological Headaches?: No Dizziness?: No  Psychologic Depression?: No Anxiety?: No  Physical Exam: BP 111/73   Pulse 74   Ht 5\' 5"  (1.651 m)   Wt 156 lb (70.8 kg)   BMI 25.96 kg/m   Constitutional:  Alert and oriented, No acute distress.  Accompanied today by his wife.  Elderly. HEENT: Early AT, moist mucus membranes.  Trachea midline, no masses. Cardiovascular: RRR. Respiratory: Normal respiratory effort, no increased work of breathing. CTAB. GI: Abdomen is soft, nontender, nondistended, no abdominal masses GU: Foley catheter draining clear yellow urine. Skin: No rashes, bruises or suspicious lesions. Neurologic: Grossly intact, no focal deficits, moving all 4  extremities. Psychiatric: Normal mood and affect.  Laboratory Data: Lab Results  Component Value Date   WBC 11.0 (H) 04/16/2016   HGB 9.7 (L) 04/16/2016   HCT 29.1 (L) 04/16/2016   MCV 80.9 04/16/2016   PLT 151 04/16/2016    Lab Results  Component Value Date   CREATININE 2.77 (H) 04/30/2016     Lab Results  Component Value Date   HGBA1C 7.3 (H) 04/12/2016    Urinalysis    Component Value Date/Time   COLORURINE RED (A) 04/12/2016 1123   APPEARANCEUR CLOUDY (A) 04/12/2016 1123   LABSPEC 1.014 04/12/2016 1123   PHURINE 5.0 04/12/2016 1123   GLUCOSEU NEGATIVE 04/12/2016 1123   HGBUR 3+ (A) 04/12/2016 1123   BILIRUBINUR NEGATIVE 04/12/2016 1123   KETONESUR NEGATIVE 04/12/2016 1123   PROTEINUR 100 (A) 04/12/2016 1123   NITRITE NEGATIVE 04/12/2016 1123   LEUKOCYTESUR 3+ (A) 04/12/2016 1123    Pertinent Imaging: CLINICAL DATA:  Crampy abdominal pain with urinary urgency for 1 week. History of left ureteral stent and Foley catheter.  EXAM: CT ABDOMEN AND PELVIS WITHOUT CONTRAST  TECHNIQUE: Multidetector CT imaging of the abdomen and pelvis was performed following the standard protocol without IV contrast.  COMPARISON:  Abdominal pelvic CT 04/12/2016.  And 04/30/2009  FINDINGS: Lower chest: Interval improved aeration of the lung bases with mild residual right lower lobe atelectasis or scarring. There is no confluent airspace opacity, pleural or pericardial effusion. Mild aortic and coronary artery atherosclerosis noted.  Hepatobiliary: The liver appears unremarkable as imaged in the noncontrast state. No significant biliary dilatation status post cholecystectomy.  Pancreas: Atrophied without focal abnormality or surrounding inflammatory change.  Spleen: Normal in size without focal abnormality.  Adrenals/Urinary Tract: There is a stable 14 mm left adrenal nodule on image 13 from 2010, consistent with an adenoma. The right adrenal gland appears  normal. The right kidney appears normal. Double-J left ureteral stent is well positioned. There are small stone fragments along the distal end of the stent versus calcification of the stent walls. The left renal collecting system is decompressed with improved perinephric soft tissue stranding. There are small stone fragments in the lower pole calices. Foley catheter remains in place. The bladder is decompressed. There are small stone fragments within the bladder.  Stomach/Bowel: The stomach, small bowel and appendix demonstrate no significant findings. There are diverticular changes throughout the colon. There is mild sigmoid colon wall thickening and surrounding soft tissue stranding which could indicate mild diverticulitis. No extraluminal fluid or definite air collection seen in this  area.  Vascular/Lymphatic: There are no enlarged abdominal or pelvic lymph nodes. Extensive aortic and branch vessel atherosclerosis. There is stable mild dilatation of both common iliac arteries to 2.3 cm (coronal image 49).  Reproductive: Stable marked enlargement of the prostate gland, measuring up to 7.0 x 6.3 cm transverse. The seminal vesicles appear unremarkable.  Other: Stable asymmetric fat in the left inguinal canal. No herniated bowel or ascites.  Musculoskeletal: No acute or significant osseous findings. Stable lower lumbar spondylosis.  IMPRESSION: 1. Double-J left ureteral stent appears well positioned. There is no residual hydronephrosis. However, there are multiple stone fragments within the left lower pole calices, bladder and distal left ureter (versus stent encrustation). 2. The right kidney and collecting system appear unremarkable. 3. Extensive colonic diverticulosis with wall thickening and mild surrounding soft tissue stranding in the sigmoid region, suspicious for mild diverticulitis. 4. Diffuse atherosclerosis. 5. Prostatomegaly.   Electronically Signed    By: Carey Bullocks M.D.   On: 05/19/2016 09:40  CT scan personally reviewed today.  Assessment & Plan:    1. Left ureteral stone Recommend return to the OR for second stage left ureteroscopy to clear his residual left distal ureteral stone burden. He has been for agreeable to this plan. They understand the risks and benefits. Plan for returned OR. Repeat preop urine culture today, we'll go ahead and treat with Cipro twice a day 3 days prior to the procedure given his history of profound urosepsis in the recent past. - CULTURE, URINE COMPREHENSIVE  2. Benign prostatic hyperplasia with urinary retention Status post totally and laser ablation of the prostate, however, only enough prostate was ablated to more easily accessible left ureter, unlikely to be a definitive outlet procedure I have recommended consideration of placement of a suprapubic tube at the time of upcoming ureteroscopy given his history of chronic retention. Once the stone burden has been adequately addressed and his stent removed, we can proceed with clamping trials as cause I voiding trials and he will have a suprapubic tube to fall back on a piece unable to void as highly suspected. We reviewed the risks and benefits of suprapubic tube in the past and he is previous to consider this. All his questions were answered today. He understands the risks of bleeding, infection, damage is running structures, need for tube replacement, urinary tract infections, and injury to surrounding structures including bowel.  Schedule surgery as above  [name], MD  [facility] [address] [place], [place] [phone]  I spent 25 min with this patient of which greater than 50% was spent in counseling and coordination of care with the patient.

## 2016-05-31 NOTE — Anesthesia Procedure Notes (Signed)
Procedure Name: LMA Insertion Date/Time: 05/31/2016 1:30 PM Performed by: Karoline Caldwell Pre-anesthesia Checklist: Patient identified, Suction available, Emergency Drugs available and Patient being monitored Patient Re-evaluated:Patient Re-evaluated prior to inductionOxygen Delivery Method: Circle system utilized Preoxygenation: Pre-oxygenation with 100% oxygen Intubation Type: IV induction Ventilation: Mask ventilation without difficulty LMA: LMA inserted LMA Size: 4.5 Number of attempts: 1 Placement Confirmation: positive ETCO2 and breath sounds checked- equal and bilateral Tube secured with: Tape Dental Injury: Teeth and Oropharynx as per pre-operative assessment

## 2016-05-31 NOTE — Transfer of Care (Signed)
Immediate Anesthesia Transfer of Care Note  Patient: [name]  Procedure(s) Performed: Procedure(s): CYSTOSCOPY WITH STENT REPLACEMENT (Left) INSERTION OF SUPRAPUBIC CATHETER (N/A) URETEROSCOPY  Patient Location: PACU  Anesthesia Type:General  Level of Consciousness: awake and alert   Airway & Oxygen Therapy: Patient Spontanous Breathing and Patient connected to face mask oxygen  Post-op Assessment: Report given to RN and Post -op Vital signs reviewed and stable  Post vital signs: Reviewed and stable  Last Vitals:  Vitals:   05/31/16 1234 05/31/16 1440  BP: (!) 101/55 (!) 105/50  Pulse: 91 85  Resp: 16 16  Temp: (!) 35.7 C     Last Pain:  Vitals:   05/31/16 1234  TempSrc: Tympanic         Complications: No apparent anesthesia complications

## 2016-05-31 NOTE — Progress Notes (Signed)
EKG done for missed beats noted and with missing p waves    First degree    Dr Karlton Lemon called  EKG shows accelerated junctional   No new orders

## 2016-05-31 NOTE — Anesthesia Preprocedure Evaluation (Signed)
Anesthesia Evaluation  Patient identified by MRN, date of birth, ID band Patient awake    Reviewed: Allergy & Precautions, H&P , NPO status , Patient's Chart, lab work & pertinent test results  History of Anesthesia Complications Negative for: history of anesthetic complications  Airway Mallampati: III  TM Distance: <3 FB Neck ROM: limited    Dental  (+) Poor Dentition, Chipped   Pulmonary shortness of breath and with exertion, sleep apnea , former smoker,    Pulmonary exam normal breath sounds clear to auscultation       Cardiovascular Exercise Tolerance: Poor hypertension, + angina + CAD, + Past MI, + Peripheral Vascular Disease, +CHF and + DOE  Normal cardiovascular examII Rhythm:regular Rate:Normal     Neuro/Psych negative neurological ROS  negative psych ROS   GI/Hepatic negative GI ROS, Neg liver ROS, neg GERD  ,  Endo/Other  diabetes, Type 2, Insulin Dependent  Renal/GU Renal disease  negative genitourinary   Musculoskeletal   Abdominal   Peds  Hematology negative hematology ROS (+)   Anesthesia Other Findings Past Medical History:   Pure hypercholesterolemia                                    Type II or unspecified type diabetes mellitus *              Hypersomnia with sleep apnea, unspecified                    Other and unspecified hyperlipidemia                         Essential hypertension, benign                               Coronary artery disease                                      BPH (benign prostatic hypertrophy)                           Diverticulosis                                               MI (myocardial infarction) (HCC)                             GI bleed                                        2015        Past Surgical History:   TONSILLECTOMY                                                 inguinal hernia repair  Comment:left inguinal     CHOLECYSTECTOMY                                  1994         FLEXIBLE SIGMOIDOSCOPY                                        CARDIAC CATHETERIZATION                                       CORONARY ANGIOPLASTY                             1996           Comment:[place]  BMI    Body Mass Index   27.79 kg/m 2    Patient is NPO appropriate and reports no nausea or vomiting today.     Reproductive/Obstetrics negative OB ROS                             Anesthesia Physical  Anesthesia Plan  ASA: IV  Anesthesia Plan: General   Post-op Pain Management:    Induction:   Airway Management Planned:   Additional Equipment:   Intra-op Plan:   Post-operative Plan:   Informed Consent: I have reviewed the patients History and Physical, chart, labs and discussed the procedure including the risks, benefits and alternatives for the proposed anesthesia with the patient or authorized representative who has indicated his/her understanding and acceptance.   Dental Advisory Given  Plan Discussed with: Anesthesiologist, CRNA and Surgeon  Anesthesia Plan Comments: (Patient informed that they are higher risk for complications from anesthesia during this procedure due to their medical history.  Patient voiced understanding. )        Anesthesia Quick Evaluation

## 2016-05-31 NOTE — Progress Notes (Signed)
Changed dsg to suprapubic area with drain sponges and paper tape

## 2016-05-31 NOTE — Progress Notes (Signed)
Dr Karlton Lemon in to see pt and to check about EKG   Blood suger 189

## 2016-05-31 NOTE — Discharge Instructions (Signed)
You have a ureteral stent in place.  This is a tube that extends from your kidney to your bladder.  This may cause urinary bleeding, burning with urination, and urinary frequency.  Please call our office or present to the ED if you develop fevers >101 or pain which is not able to be controlled with oral pain medications.  You may be given either Flomax and/ or ditropan to help with bladder spasms and stent pain in addition to pain medications.    Your stent is attached to a string.  Next Monday, pull string gently and remove stent in entirety.    [facility] [address] [place], [place] [address] [phone]   Imaging center to call pt with appt for ultrasound. Dr Assunta Gambles office will call pt with appt.

## 2016-05-31 NOTE — Interval H&P Note (Signed)
History and Physical Interval Note:  05/31/2016 1:24 PM  [name]  has presented today for surgery, with the diagnosis of LEFT URETERAL STONE  The various methods of treatment have been discussed with the patient and family. After consideration of risks, benefits and other options for treatment, the patient has consented to  Procedure(s): URETEROSCOPY WITH HOLMIUM LASER LITHOTRIPSY (Left) HOLMIUM LASER APPLICATION (N/A) CYSTOSCOPY WITH STENT REPLACEMENT (Left) INSERTION OF SUPRAPUBIC CATHETER (N/A) as a surgical intervention .  The patient's history has been reviewed, patient examined, no change in status, stable for surgery.  I have reviewed the patient's chart and labs.  Questions were answered to the patient's satisfaction.     [name]

## 2016-06-01 ENCOUNTER — Ambulatory Visit: Payer: Medicare Other

## 2016-06-01 ENCOUNTER — Telehealth: Payer: Self-pay

## 2016-06-01 ENCOUNTER — Encounter: Payer: Self-pay | Admitting: Urology

## 2016-06-01 NOTE — Telephone Encounter (Signed)
Patient called stating that this morning when he went to the restroom he saw blood at the tip of his penis and was attempting to clean when he saw a string and tape.  He did not know what this was for and proceeded to cut the string. It was explained to the patient that this was the string for his stent that he is to keep until Monday per Dr. Apolinar Junes and then pull out himself. Patient states he does not feel comfortable pulling string himself and only 2cm of string is still visible. Patient was transferred to the front to have a nurse apt made for Monday for string stent removal and it was explained in detail for him not to pull on or cut the string again. Patient verbalized understanding.

## 2016-06-02 NOTE — Anesthesia Postprocedure Evaluation (Signed)
Anesthesia Post Note  Patient: [name]  Procedure(s) Performed: Procedure(s) (LRB): CYSTOSCOPY WITH STENT REPLACEMENT (Left) INSERTION OF SUPRAPUBIC CATHETER (N/A) URETEROSCOPY  Patient location during evaluation: PACU Anesthesia Type: General Level of consciousness: awake and alert Pain management: pain level controlled Vital Signs Assessment: post-procedure vital signs reviewed and stable Respiratory status: spontaneous breathing, nonlabored ventilation, respiratory function stable and patient connected to nasal cannula oxygen Cardiovascular status: blood pressure returned to baseline and stable Postop Assessment: no signs of nausea or vomiting Anesthetic complications: no    Last Vitals:  Vitals:   05/31/16 1620 05/31/16 1658  BP: 114/83 (!) 120/56  Pulse: 80 81  Resp: 18   Temp: 36.7 C     Last Pain:  Vitals:   06/01/16 0936  TempSrc:   PainSc: 2                  Lenard Simmer

## 2016-06-07 ENCOUNTER — Ambulatory Visit: Payer: Medicare Other | Admitting: Urology

## 2016-06-07 ENCOUNTER — Other Ambulatory Visit: Payer: Self-pay | Admitting: Urology

## 2016-06-07 VITALS — BP 156/79 | HR 82 | Ht 65.0 in | Wt 161.7 lb

## 2016-06-07 DIAGNOSIS — N201 Calculus of ureter: Secondary | ICD-10-CM

## 2016-06-07 MED ORDER — CIPROFLOXACIN HCL 500 MG PO TABS
500.0000 mg | ORAL_TABLET | Freq: Once | ORAL | Status: AC
  Administered 2016-06-07: 500 mg via ORAL

## 2016-06-07 MED ORDER — LIDOCAINE HCL 2 % EX GEL
1.0000 "application " | Freq: Once | CUTANEOUS | Status: AC
  Administered 2016-06-07: 1 via URETHRAL

## 2016-06-07 NOTE — Telephone Encounter (Signed)
When pt was in office advised pt to get OTC colace.

## 2016-06-07 NOTE — Progress Notes (Signed)
Pt s/p staged left URS. A string was left on the stent, but he cut the string. He needs cystoscopy today to remove the stent. Last urine cx was negative. Urine cx prior to that klebsiella sensitive to Cipro.   Cystoscopy/ Stent removal procedure  Patient identification was confirmed, informed consent was obtained, and patient was prepped using Betadine solution.  Lidocaine jelly was administered per urethral meatus.    Preoperative abx where received prior to procedure.    Procedure: - Flexible cystoscope introduced, without any difficulty.   - normal urethral meatus  Stent STRING seen emanating from membranous urethra. Both sides grasped wiith stent graspers, and removed in entirety. I did not need to enter the bladder.   Post-Procedure: - Patient tolerated the procedure well. F/u for renal u/s as planned.

## 2016-06-24 ENCOUNTER — Ambulatory Visit
Admission: RE | Admit: 2016-06-24 | Discharge: 2016-06-24 | Disposition: A | Discharge status: Home / Self Care | Payer: Medicare Other | Source: Ambulatory Visit | Attending: Urology | Admitting: Urology

## 2016-06-24 DIAGNOSIS — R93421 Abnormal radiologic findings on diagnostic imaging of right kidney: Secondary | ICD-10-CM | POA: Insufficient documentation

## 2016-06-24 DIAGNOSIS — N201 Calculus of ureter: Secondary | ICD-10-CM | POA: Diagnosis present

## 2016-06-24 DIAGNOSIS — R93422 Abnormal radiologic findings on diagnostic imaging of left kidney: Secondary | ICD-10-CM | POA: Diagnosis not present

## 2016-07-09 ENCOUNTER — Encounter: Payer: Self-pay | Admitting: Urology

## 2016-07-09 ENCOUNTER — Ambulatory Visit (INDEPENDENT_AMBULATORY_CARE_PROVIDER_SITE_OTHER): Payer: Medicare Other | Admitting: Urology

## 2016-07-09 VITALS — BP 190/87 | HR 68 | Ht 65.0 in | Wt 165.0 lb

## 2016-07-09 DIAGNOSIS — N201 Calculus of ureter: Secondary | ICD-10-CM

## 2016-07-09 DIAGNOSIS — N138 Other obstructive and reflux uropathy: Secondary | ICD-10-CM

## 2016-07-09 DIAGNOSIS — R339 Retention of urine, unspecified: Secondary | ICD-10-CM

## 2016-07-09 DIAGNOSIS — N401 Enlarged prostate with lower urinary tract symptoms: Secondary | ICD-10-CM | POA: Diagnosis not present

## 2016-07-09 MED ORDER — TAMSULOSIN HCL 0.4 MG PO CAPS: 0.4000 mg | ORAL_CAPSULE | Freq: Every day | ORAL | 3 refills | Status: AC

## 2016-07-09 MED ORDER — TAMSULOSIN HCL 0.4 MG PO CAPS: 0.4000 mg | ORAL_CAPSULE | Freq: Every day | ORAL | 11 refills | Status: DC

## 2016-07-09 NOTE — Progress Notes (Signed)
07/09/2016 4:15 PM   [name] 08/19/1927 [id number]  Referring provider: Lauro Regulus, MD [address] [facility] [place] [facility] [place], [place] [address]  Chief Complaint  Patient presents with  . Results    1 month w/renal u/s    HPI: 83 year old male with chronic urinary retention previously managed by an indwelling Foley catheter recently admitted to the hospital after hospital  for severe sepsis secondary to an obstructing 16 mm left mid ureteral stone.  He underwent urgent left ureteral percutaneous nephrostomy tube placement on 04/12/2016 and was admitted to the ICU for sequela of urosepsis.  His urine ultimately grew Klebsiella and his antibiotics were transitioned to Cipro at the time of discharge.  He also developed acute on chronic kidney injury which was improving slowly at the time of discharge back to his baseline of 1.7.  He returned to the operating room on 05/11/2016 for left ureteroscopy, laser lithotripsy, stent placement with a nephrostomy tube removal, and laser ablation of his prostate with the Thulium laser to help create channel to access his left ureter.    Interval follow-up CT stone protocol on 05/19/2016 showed fairly significant but fragmented left distal ureteral stone debris along the stent and small amount of refluxed left lower pole stone debris.   He returned to the operating room on 05/31/2016 to address his residual fragments. This was ultimately successful.  His stent was left in place on a string removed in the clinic on 06/07/2016.  Ring this procedure, a suprapubic tube was placed.  His SPT has been draining well.  Overall, he is doing quite well. He is anxious about the appointment today.    PMH: Past Medical History:  Diagnosis Date  . Anemia   . BPH (benign prostatic hypertrophy)   . Cardiomyopathy (HCC)   . Coronary artery disease   . Diverticulosis   . Essential hypertension, benign   . GI bleed 2015  . History of  kidney stones   . Hypersomnia with sleep apnea, unspecified   . MI (myocardial infarction) 1996  . Occasional tremors   . Other and unspecified hyperlipidemia   . Pure hypercholesterolemia   . Sleep apnea    unable to use C-PAP  . Type II or unspecified type diabetes mellitus without mention of complication, uncontrolled     Surgical History: Past Surgical History:  Procedure Laterality Date  . CARDIAC CATHETERIZATION    . CHOLECYSTECTOMY  1994  . CORONARY ANGIOPLASTY  1996   [place]  . CYSTOSCOPY W/ URETERAL STENT PLACEMENT Left 05/31/2016   Procedure: CYSTOSCOPY WITH STENT REPLACEMENT;  Surgeon: [name], MD;  Location: [facility] ORS;  Service: Urology;  Laterality: Left;  . CYSTOSCOPY WITH LITHOLAPAXY N/A 05/11/2016   Procedure: CYSTOSCOPY WITH LITHOLAPAXY;  Surgeon: [name], MD;  Location: [facility] ORS;  Service: Urology;  Laterality: N/A;  . CYSTOSCOPY/URETEROSCOPY/HOLMIUM LASER/STENT PLACEMENT Left 05/11/2016   Procedure: CYSTOSCOPY/URETEROSCOPY/HOLMIUM LASER/STENT PLACEMENT;  Surgeon: [name], MD;  Location: [facility] ORS;  Service: Urology;  Laterality: Left;  . ESOPHAGOGASTRODUODENOSCOPY (EGD) WITH PROPOFOL Left 11/23/2015   Procedure: ESOPHAGOGASTRODUODENOSCOPY (EGD) WITH PROPOFOL;  Surgeon: [name], MD;  Location: [facility] ENDOSCOPY;  Service: Endoscopy;  Laterality: Left;  . EYE SURGERY Bilateral    Cataract Extraction with IOL   . FLEXIBLE SIGMOIDOSCOPY    . HERNIA REPAIR Left    Inguinal Hernia Repair  . inguinal hernia repair     left inguinal   . INSERTION OF SUPRAPUBIC CATHETER N/A 05/31/2016  Procedure: INSERTION OF SUPRAPUBIC CATHETER;  Surgeon: [name], MD;  Location: [facility] ORS;  Service: Urology;  Laterality: N/A;  . IR GENERIC HISTORICAL  04/12/2016   IR NEPHROSTOMY PLACEMENT LEFT 04/12/2016 [name], MD [facility]INTER[facility] RAD  . KNEE ARTHROSCOPY Left   . NEPHROSTOMY  05/11/2016   Procedure: NEPHROSTOMY tude removal;  Surgeon: [name],  MD;  Location: [facility] ORS;  Service: Urology;;  . TONSILLECTOMY    . URETEROSCOPY  05/31/2016   Procedure: URETEROSCOPY;  Surgeon: [name], MD;  Location: [facility] ORS;  Service: Urology;;    Home Medications:  Allergies as of 07/09/2016      Reactions   Exforge [amlodipine Besylate-valsartan] Swelling, Other (See Comments)   Pt states that he is unable to swallow.     Lisinopril Swelling, Other (See Comments)   Pt states that he is unable to swallow.     Niacin And Related Swelling, Other (See Comments)   Pt states that he is unable to swallow.        Medication List       Accurate as of 07/09/16  4:15 PM. Always use your most recent med list.          Co Q 10 10 MG Caps Take 1 tablet by mouth daily.   colchicine-probenecid 0.5-500 MG tablet Take 1 tablet by mouth daily.   docusate sodium 100 MG capsule Commonly known as:  COLACE Take 1 capsule (100 mg total) by mouth 2 (two) times daily as needed (take to keep stool soft.).   ferrous sulfate 325 (65 FE) MG tablet Take 325 mg by mouth daily with breakfast.   finasteride 5 MG tablet Commonly known as:  PROSCAR Take 1 tablet (5 mg total) by mouth daily.   furosemide 40 MG tablet Commonly known as:  LASIX Take 40-80 mg by mouth 2 (two) times daily. Take 2 tablets every am & 1 tablet every pm.   glimepiride 1 MG tablet Commonly known as:  AMARYL Take 1 mg by mouth daily with breakfast.   HUMALOG MIX 75/25 KWIKPEN (75-25) 100 UNIT/ML Kwikpen Generic drug:  Insulin Lispro Prot & Lispro   losartan 100 MG tablet Commonly known as:  COZAAR Take 100 mg by mouth daily.   Magnesium 400 MG Tabs Take 400 mg by mouth every other day.   metolazone 5 MG tablet Commonly known as:  ZAROXOLYN Take 5 mg by mouth as needed. Reported on 12/15/2015   tamsulosin 0.4 MG Caps capsule Commonly known as:  FLOMAX Take 1 capsule (0.4 mg total) by mouth daily.       Allergies:  Allergies  Allergen Reactions  . Exforge  [Amlodipine Besylate-Valsartan] Swelling and Other (See Comments)    Pt states that he is unable to swallow.    . Lisinopril Swelling and Other (See Comments)    Pt states that he is unable to swallow.    . Niacin And Related Swelling and Other (See Comments)    Pt states that he is unable to swallow.      Family History: Family History  Problem Relation Age of Onset  . Rheum arthritis Mother   . Alzheimer's disease Father     Social History:  reports that he has quit smoking. His smoking use included Cigarettes. He has a 50.00 pack-year smoking history. He has never used smokeless tobacco. He reports that he does not drink alcohol or use drugs.  ROS: UROLOGY Frequent Urination?: No Hard to postpone urination?: No Burning/pain with urination?:  No Get up at night to urinate?: No Leakage of urine?: No Urine stream starts and stops?: No Trouble starting stream?: No Do you have to strain to urinate?: No Blood in urine?: No Urinary tract infection?: No Sexually transmitted disease?: No Injury to kidneys or bladder?: No Painful intercourse?: No Weak stream?: No Erection problems?: No Penile pain?: No  Gastrointestinal Nausea?: No Vomiting?: No Indigestion/heartburn?: No Diarrhea?: No Constipation?: No  Constitutional Fever: No Night sweats?: No Weight loss?: No Fatigue?: No  Skin Skin rash/lesions?: No Itching?: Yes  Eyes Blurred vision?: No Double vision?: No  Ears/Nose/Throat Sore throat?: No Sinus problems?: No  Hematologic/Lymphatic Swollen glands?: No Easy bruising?: No  Cardiovascular Leg swelling?: No Chest pain?: No  Respiratory Cough?: No Shortness of breath?: No  Endocrine Excessive thirst?: No  Musculoskeletal Back pain?: No Joint pain?: No  Neurological Headaches?: No Dizziness?: No  Psychologic Depression?: No Anxiety?: No  Physical Exam: BP (!) 190/87   Pulse 68   Ht 5\' 5"  (1.651 m)   Wt 165 lb (74.8 kg)   BMI 27.46  kg/m   Constitutional:  Alert and oriented, No acute distress.  Accompanied today by his wife.  Elderly. HEENT: [place] AT, moist mucus membranes.  Trachea midline, no masses. Cardiovascular: No significant lower extremity edema, clubbing, or cyanosis today. Respiratory: Normal respiratory effort, no increased work of breathing.  GI: Abdomen is soft, nontender, nondistended, no abdominal masses GU: Suprapubic tube site clean dry and intact. Skin: No rashes, bruises or suspicious lesions. Neurologic: Grossly intact, no focal deficits, moving all 4 extremities. Psychiatric: Normal mood and affect.  Laboratory Data: Lab Results  Component Value Date   WBC 11.0 (H) 04/16/2016   HGB 9.7 (L) 04/16/2016   HCT 29.1 (L) 04/16/2016   MCV 80.9 04/16/2016   PLT 151 04/16/2016    Lab Results  Component Value Date   CREATININE 2.77 (H) 04/30/2016     Lab Results  Component Value Date   HGBA1C 7.3 (H) 04/12/2016    Urinalysis N/a   Procedure: Patient's previous suprapubic tube was removed after removing the drain stitch. System without difficulty. The site was prepped using Betadine solution. A new 16 French suprapubic tube Foley catheter was inserted through the suprapubic tube tract without difficulty into the bladder. This drained prior to balloon inflation with 10 cc of sterile water. The tube was capped.  He tolerated the procedure well.  Assessment & Plan:    1. Left ureteral stone S/p uncomplicated ureteroscopy Stent removed ~4 weeks ago F/u RUS with no hydronephrosis Encouraged fluid intake  2. Benign prostatic hyperplasia with urinary retention Chronic retention s/p multiple failed VT in past Continue flomax Status post totally and laser ablation of the prostate, however, only enough prostate was ablated to more easily accessible left ureter, unlikely to be a definitive outlet procedure S/p SPT exchange today I have recommended leaving the SP tube in place. He will try to  void spontaneously through his penis with the suprapubic tube as a safety valve. I recommended opening the suprapubic tube several times after voiding and recording outputs to ensure that his postvoid residual is minimal. He will call us if these are elevated. If he is unable to void, he will simply leave his suprapubic tube to continuous drainage.  Follow-up in 4 weeks for suprapubic tube exchange.   [name], MD  [facility] [address] [place], [place] [address] [phone]  I spent 25 min with this patient of which  greater than 50% was spent in counseling and coordination of care with the patient.

## 2016-08-13 ENCOUNTER — Ambulatory Visit: Payer: Medicare Other | Admitting: Urology

## 2016-08-13 ENCOUNTER — Encounter: Payer: Self-pay | Admitting: Urology

## 2016-08-13 VITALS — BP 188/85 | HR 80 | Ht 65.0 in | Wt 167.3 lb

## 2016-08-13 DIAGNOSIS — R339 Retention of urine, unspecified: Secondary | ICD-10-CM

## 2016-08-13 DIAGNOSIS — N200 Calculus of kidney: Secondary | ICD-10-CM

## 2016-08-13 DIAGNOSIS — R350 Frequency of micturition: Secondary | ICD-10-CM

## 2016-08-13 DIAGNOSIS — N401 Enlarged prostate with lower urinary tract symptoms: Secondary | ICD-10-CM | POA: Diagnosis not present

## 2016-08-13 DIAGNOSIS — N138 Other obstructive and reflux uropathy: Secondary | ICD-10-CM

## 2016-08-13 NOTE — Progress Notes (Signed)
08/13/2016 5:40 PM   [name] 1928/02/26 [id number]  Referring provider: Lauro Regulus, MD [address] [facility] [place] [facility] [place], [place] [address]  Chief Complaint  Patient presents with  . Follow-up    BPH w/ urinary obstruction, SP tube exchange     HPI: 83 year old male with chronic urinary retention previously managed by an indwelling Foley catheter recently admitted to the hospital after hospital  for severe sepsis secondary to an obstructing 16 mm left mid ureteral stone.  He underwent urgent left ureteral percutaneous nephrostomy tube placement on 04/12/2016 and was admitted to the ICU for sequela of urosepsis.  His urine ultimately grew Klebsiella and his antibiotics were transitioned to Cipro at the time of discharge.  He also developed acute on chronic kidney injury which was improving slowly at the time of discharge back to his baseline of 1.7.  He returned to the operating room on 05/11/2016 for left ureteroscopy, laser lithotripsy, stent placement with a nephrostomy tube removal, and laser ablation of his prostate with the Thulium laser to help create channel to access his left ureter.    Interval follow-up CT stone protocol on 05/19/2016 showed fairly significant but fragmented left distal ureteral stone debris along the stent and small amount of refluxed left lower pole stone debris.   He returned to the operating room on 05/31/2016 to address his residual fragments. This was ultimately successful.  His stent was left in place on a string removed in the clinic on 06/07/2016.  During this procedure, a suprapubic tube was placed.  Follow-up renal ultrasound in 06/24/2016 showed no evidence of residual hydroureteronephrosis.  Since his last visit, he is been voiding through his penis and unclamping his SP tube for residuals. He is a voiding diary with him today.  He reports voided volumes relatively low volumes up to 200 cc at a time. These are fairly  frequent. Each time, he was on His SP tube and drain relatively low volumes compared to his voided volume no greater than 50 cc. Generally, these were in the 25-50 cc range up always significantly less than voided volume.    He complains today is urinary frequency. He described every 2 hours throughout the day and 3-4 times at night. He continues to take Flomax and finasteride.  PMH: Past Medical History:  Diagnosis Date  . Anemia   . BPH (benign prostatic hypertrophy)   . Cardiomyopathy (HCC)   . Coronary artery disease   . Diverticulosis   . Essential hypertension, benign   . GI bleed 2015  . History of kidney stones   . Hypersomnia with sleep apnea, unspecified   . MI (myocardial infarction) 1996  . Occasional tremors   . Other and unspecified hyperlipidemia   . Pure hypercholesterolemia   . Sleep apnea    unable to use C-PAP  . Type II or unspecified type diabetes mellitus without mention of complication, uncontrolled     Surgical History: Past Surgical History:  Procedure Laterality Date  . CARDIAC CATHETERIZATION    . CHOLECYSTECTOMY  1994  . CORONARY ANGIOPLASTY  1996   [place]  . CYSTOSCOPY W/ URETERAL STENT PLACEMENT Left 05/31/2016   Procedure: CYSTOSCOPY WITH STENT REPLACEMENT;  Surgeon: [name], MD;  Location: [facility] OR[facility];  Service: Urology;  Laterality: Left;  . CYSTOSCOPY WITH LITHOLAPAXY N/A 05/11/2016   Procedure: CYSTOSCOPY WITH LITHOLAPAXY;  Surgeon: [name], MD;  Location: [facility];  Service: Urology;  Laterality: N/A;  . CYSTOSCOPY/URETEROSCOPY/HOLMIUM LASER/STENT  PLACEMENT Left 05/11/2016   Procedure: CYSTOSCOPY/URETEROSCOPY/HOLMIUM LASER/STENT PLACEMENT;  Surgeon: [name], MD;  Location: [facility];  Service: Urology;  Laterality: Left;  . ESOPHAGOGASTRODUODENOSCOPY (EGD) WITH PROPOFOL Left 11/23/2015   Procedure: ESOPHAGOGASTRODUODENOSCOPY (EGD) WITH PROPOFOL;  Surgeon: [name], MD;  Location: [facility] ENDOSCOPY;  Service: Endoscopy;   Laterality: Left;  . EYE SURGERY Bilateral    Cataract Extraction with IOL   . FLEXIBLE SIGMOIDOSCOPY    . HERNIA REPAIR Left    Inguinal Hernia Repair  . inguinal hernia repair     left inguinal   . INSERTION OF SUPRAPUBIC CATHETER N/A 05/31/2016   Procedure: INSERTION OF SUPRAPUBIC CATHETER;  Surgeon: [name], MD;  Location: [facility];  Service: Urology;  Laterality: N/A;  . IR GENERIC HISTORICAL  04/12/2016   IR NEPHROSTOMY PLACEMENT LEFT 04/12/2016 [name], MD [facility]INTER[facility] RAD  . KNEE ARTHROSCOPY Left   . NEPHROSTOMY  05/11/2016   Procedure: NEPHROSTOMY tude removal;  Surgeon: [name], MD;  Location: [facility];  Service: Urology;;  . TONSILLECTOMY    . URETEROSCOPY  05/31/2016   Procedure: URETEROSCOPY;  Surgeon: [name], MD;  Location: [facility];  Service: Urology;;    Home Medications:  Allergies as of 08/13/2016      Reactions   Exforge [amlodipine Besylate-valsartan] Swelling, Other (See Comments)   Pt states that he is unable to swallow.     Lisinopril Swelling, Other (See Comments)   Pt states that he is unable to swallow.     Niacin And Related Swelling, Other (See Comments)   Pt states that he is unable to swallow.        Medication List       Accurate as of 08/13/16  5:40 PM. Always use your most recent med list.          Co Q 10 10 MG Caps Take 1 tablet by mouth daily.   colchicine-probenecid 0.5-500 MG tablet Take 1 tablet by mouth daily.   docusate sodium 100 MG capsule Commonly known as:  COLACE Take 1 capsule (100 mg total) by mouth 2 (two) times daily as needed (take to keep stool soft.).   ferrous sulfate 325 (65 FE) MG tablet Take 325 mg by mouth daily with breakfast.   finasteride 5 MG tablet Commonly known as:  PROSCAR Take 1 tablet (5 mg total) by mouth daily.   furosemide 40 MG tablet Commonly known as:  LASIX Take 40-80 mg by mouth 2 (two) times daily. Take 2 tablets every am & 1 tablet every pm.   glimepiride 1 MG  tablet Commonly known as:  AMARYL Take 1 mg by mouth daily with breakfast.   HUMALOG MIX 75/25 KWIKPEN (75-25) 100 UNIT/ML Kwikpen Generic drug:  Insulin Lispro Prot & Lispro   losartan 100 MG tablet Commonly known as:  COZAAR Take 100 mg by mouth daily.   Magnesium 400 MG Tabs Take 400 mg by mouth every other day.   metolazone 5 MG tablet Commonly known as:  ZAROXOLYN Take 5 mg by mouth as needed. Reported on 12/15/2015   tamsulosin 0.4 MG Caps capsule Commonly known as:  FLOMAX Take 1 capsule (0.4 mg total) by mouth daily.       Allergies:  Allergies  Allergen Reactions  . Exforge [Amlodipine Besylate-Valsartan] Swelling and Other (See Comments)    Pt states that he is unable to swallow.    . Lisinopril Swelling and Other (See Comments)    Pt states that he is unable  to swallow.    . Niacin And Related Swelling and Other (See Comments)    Pt states that he is unable to swallow.      Family History: Family History  Problem Relation Age of Onset  . Rheum arthritis Mother   . Alzheimer's disease Father     Social History:  reports that he has quit smoking. His smoking use included Cigarettes. He has a 50.00 pack-year smoking history. He has never used smokeless tobacco. He reports that he does not drink alcohol or use drugs.  ROS: 12 point review systems performed and is otherwise negative other than as per HPI.  Physical Exam: BP (!) 188/85   Pulse 80   Ht 5\' 5"  (1.651 m)   Wt 167 lb 4.8 oz (75.9 kg)   BMI 27.84 kg/m   Constitutional:  Alert and oriented, No acute distress.  Accompanied today by his wife.  Elderly. HEENT: [place] AT, moist mucus membranes.  Trachea midline, no masses. Cardiovascular: No significant lower extremity edema, clubbing, or cyanosis today. Respiratory: Normal respiratory effort, no increased work of breathing.  GI: Abdomen is soft, nontender, nondistended, no abdominal masses GU: Suprapubic tube site clean dry and intact. Skin: No  rashes, bruises or suspicious lesions. Neurologic: Grossly intact, no focal deficits, moving all 4 extremities. Psychiatric: Normal mood and affect.  Laboratory Data: Lab Results  Component Value Date   WBC 11.0 (H) 04/16/2016   HGB 9.7 (L) 04/16/2016   HCT 29.1 (L) 04/16/2016   MCV 80.9 04/16/2016   PLT 151 04/16/2016    Lab Results  Component Value Date   CREATININE 2.77 (H) 04/30/2016     Lab Results  Component Value Date   HGBA1C 7.3 (H) 04/12/2016    Urinalysis N/a   Procedure: After voiding, the suprapubic tube was uncapped and only 10 cc of fluid was drained from the bladder. Based on this assessment, the balloon was deflated and the suprapubic tube was removed. A dressing of 4 x 4's, Telfa, and Tegaderm was applied.  Assessment & Plan:     1. Benign prostatic hyperplasia with urinary retention Chronic retention s/p multiple failed VT in past now s/p channel Thulium laser at time of stone surgery  Voiding with SPT as safety for past month with minimal PVRs SPT revolved today after shared decision making, understands possible need for Foley/ SPT with unable to void Continue flomax/ finasteride  2. Urinary frequency Will reassess next visit- advised to keep log of voiding symptoms a few days with volumes and frequency May be related to recent procedure vs. SPT   3. History of kidney stones No residual stones or hydro on most recent RUS 12/17 KUB in 6 moths  Return in about 1 month (around 09/10/2016) for recheck PVR, IPSS.  [name], MD  [facility] [address] [place], [place] [address] [phone]  I spent 25 min with this patient of which greater than 50% was spent in counseling and coordination of care with the patient.

## 2016-09-17 ENCOUNTER — Encounter: Payer: Self-pay | Admitting: Urology

## 2016-09-17 ENCOUNTER — Ambulatory Visit (INDEPENDENT_AMBULATORY_CARE_PROVIDER_SITE_OTHER): Payer: Medicare Other | Admitting: Urology

## 2016-09-17 VITALS — BP 169/74 | HR 62 | Ht 65.0 in | Wt 169.3 lb

## 2016-09-17 DIAGNOSIS — N401 Enlarged prostate with lower urinary tract symptoms: Secondary | ICD-10-CM | POA: Diagnosis not present

## 2016-09-17 DIAGNOSIS — R350 Frequency of micturition: Secondary | ICD-10-CM | POA: Diagnosis not present

## 2016-09-17 DIAGNOSIS — N138 Other obstructive and reflux uropathy: Secondary | ICD-10-CM | POA: Diagnosis not present

## 2016-09-17 DIAGNOSIS — Z87442 Personal history of urinary calculi: Secondary | ICD-10-CM

## 2016-09-17 LAB — BLADDER SCAN AMB NON-IMAGING: SCAN RESULT: 119

## 2016-09-17 NOTE — Progress Notes (Signed)
09/17/2016 11:51 AM   [name] 1927-09-12 [id number]  Referring provider: Lauro Regulus, MD [address] [facility] [place], [place] [address]  Chief Complaint  Patient presents with  . Benign Prostatic Hypertrophy    HPI: 83 year old male with history of chronic urinary retention s/p channel Thulium laser ablation, nephrolithiasis who returns today for routine follow up after SPT removal and voiding spontaneously over the past month.     He has a personal history of chronic retention previously managed by an indwelling Foley catheter recently admitted to the hospital after hospital  for severe sepsis secondary to an obstructing 16 mm left mid ureteral stone.  He underwent urgent left ureteral percutaneous nephrostomy tube placement on 04/12/2016 and was admitted to the ICU for sequela of urosepsis.  His urine ultimately grew Klebsiella and his antibiotics were transitioned to Cipro at the time of discharge.  He also developed acute on chronic kidney injury which was improving slowly at the time of discharge back to his baseline of 1.7.  He returned to the operating room on 05/11/2016 for left ureteroscopy, laser lithotripsy, stent placement with a nephrostomy tube removal, and laser ablation of his prostate with the Thulium laser to help create channel to access his left ureter.    Interval follow-up CT stone protocol on 05/19/2016 showed fairly significant but fragmented left distal ureteral stone debris along the stent and small amount of refluxed left lower pole stone debris.   He returned to the operating room on 05/31/2016 to address his residual fragments. This was ultimately successful.  His stent was left in place on a string removed in the clinic on 06/07/2016.  During this procedure, a suprapubic tube was placed.  Follow-up renal ultrasound in 06/24/2016 showed no evidence of residual hydroureteronephrosis.  Following the procedure, his SP tube  was capped and he was allowed to void and check residuals through his SP tube. His residuals were adequate and subsequently his SP tube was removed. Since his last visit one month ago, he's been voiding spontaneously without difficulty. He feels like he is able to empty his bladder. His urinary stream is adequate.  He continues to complain today of urinary frequency. He described every 2 hours throughout the day and 3-4 times at night. He continues to take Flomax and finasteride.    He also takes Lasix.  Overall, he is please to be voiding spontaneously.    PVR today 119.  PMH: Past Medical History:  Diagnosis Date  . Anemia   . BPH (benign prostatic hypertrophy)   . Cardiomyopathy (HCC)   . Coronary artery disease   . Diverticulosis   . Essential hypertension, benign   . GI bleed 2015  . History of kidney stones   . Hypersomnia with sleep apnea, unspecified   . MI (myocardial infarction) 1996  . Occasional tremors   . Other and unspecified hyperlipidemia   . Pure hypercholesterolemia   . Sleep apnea    unable to use C-PAP  . Type II or unspecified type diabetes mellitus without mention of complication, uncontrolled     Surgical History: Past Surgical History:  Procedure Laterality Date  . CARDIAC CATHETERIZATION    . CHOLECYSTECTOMY  1994  . CORONARY ANGIOPLASTY  1996   [place]  . CYSTOSCOPY W/ URETERAL STENT PLACEMENT Left 05/31/2016   Procedure: CYSTOSCOPY WITH STENT REPLACEMENT;  Surgeon: [name], MD;  Location: [facility];  Service: Urology;  Laterality: Left;  . CYSTOSCOPY WITH LITHOLAPAXY  N/A 05/11/2016   Procedure: CYSTOSCOPY WITH LITHOLAPAXY;  Surgeon: [name], MD;  Location: [facility];  Service: Urology;  Laterality: N/A;  . CYSTOSCOPY/URETEROSCOPY/HOLMIUM LASER/STENT PLACEMENT Left 05/11/2016   Procedure: CYSTOSCOPY/URETEROSCOPY/HOLMIUM LASER/STENT PLACEMENT;  Surgeon: [name], MD;  Location: [facility];  Service: Urology;  Laterality: Left;  .  ESOPHAGOGASTRODUODENOSCOPY (EGD) WITH PROPOFOL Left 11/23/2015   Procedure: ESOPHAGOGASTRODUODENOSCOPY (EGD) WITH PROPOFOL;  Surgeon: [name], MD;  Location: [facility] ENDOSCOPY;  Service: Endoscopy;  Laterality: Left;  . EYE SURGERY Bilateral    Cataract Extraction with IOL   . FLEXIBLE SIGMOIDOSCOPY    . HERNIA REPAIR Left    Inguinal Hernia Repair  . inguinal hernia repair     left inguinal   . INSERTION OF SUPRAPUBIC CATHETER N/A 05/31/2016   Procedure: INSERTION OF SUPRAPUBIC CATHETER;  Surgeon: [name], MD;  Location: [facility];  Service: Urology;  Laterality: N/A;  . IR GENERIC HISTORICAL  04/12/2016   IR NEPHROSTOMY PLACEMENT LEFT 04/12/2016 [name], MD [facility]  . KNEE ARTHROSCOPY Left   . NEPHROSTOMY  05/11/2016   Procedure: NEPHROSTOMY tude removal;  Surgeon: [name], MD;  Location: [facility];  Service: Urology;;  . TONSILLECTOMY    . URETEROSCOPY  05/31/2016   Procedure: URETEROSCOPY;  Surgeon: [name], MD;  Location: [facility];  Service: Urology;;    Home Medications:  Allergies as of 09/17/2016      Reactions   Exforge [amlodipine Besylate-valsartan] Swelling, Other (See Comments)   Pt states that he is unable to swallow.     Lisinopril Swelling, Other (See Comments)   Pt states that he is unable to swallow.     Niacin And Related Swelling, Other (See Comments)   Pt states that he is unable to swallow.        Medication List       Accurate as of 09/17/16 11:51 AM. Always use your most recent med list.          Co Q 10 10 MG Caps Take 1 tablet by mouth daily.   colchicine-probenecid 0.5-500 MG tablet Take 1 tablet by mouth daily.   docusate sodium 100 MG capsule Commonly known as:  COLACE Take 1 capsule (100 mg total) by mouth 2 (two) times daily as needed (take to keep stool soft.).   ferrous sulfate 325 (65 FE) MG tablet Take 325 mg by mouth daily with breakfast.   finasteride 5 MG tablet Commonly known as:  PROSCAR Take  1 tablet (5 mg total) by mouth daily.   furosemide 40 MG tablet Commonly known as:  LASIX Take 40-80 mg by mouth 2 (two) times daily. Take 2 tablets every am & 1 tablet every pm.   glimepiride 1 MG tablet Commonly known as:  AMARYL Take 1 mg by mouth daily with breakfast.   HUMALOG MIX 75/25 KWIKPEN (75-25) 100 UNIT/ML Kwikpen Generic drug:  Insulin Lispro Prot & Lispro   losartan 100 MG tablet Commonly known as:  COZAAR Take 100 mg by mouth daily.   Magnesium 400 MG Tabs Take 400 mg by mouth every other day.   metolazone 5 MG tablet Commonly known as:  ZAROXOLYN Take 5 mg by mouth as needed. Reported on 12/15/2015   tamsulosin 0.4 MG Caps capsule Commonly known as:  FLOMAX Take 1 capsule (0.4 mg total) by mouth daily.       Allergies:  Allergies  Allergen Reactions  . Exforge [Amlodipine Besylate-Valsartan] Swelling and Other (See Comments)    Pt  states that he is unable to swallow.    . Lisinopril Swelling and Other (See Comments)    Pt states that he is unable to swallow.    . Niacin And Related Swelling and Other (See Comments)    Pt states that he is unable to swallow.      Family History: Family History  Problem Relation Age of Onset  . Rheum arthritis Mother   . Alzheimer's disease Father     Social History:  reports that he has quit smoking. His smoking use included Cigarettes. He has a 50.00 pack-year smoking history. He has never used smokeless tobacco. He reports that he does not drink alcohol or use drugs.  ROS: 12 point review systems performed and is otherwise negative other than as per HPI.  Physical Exam: BP (!) 169/74 (BP Location: Left Arm, Patient Position: Sitting, Cuff Size: Normal)   Pulse 62   Ht 5\' 5"  (1.651 m)   Wt 169 lb 4.8 oz (76.8 kg)   BMI 28.17 kg/m   Constitutional:  Alert and oriented, No acute distress.  Accompanied today by his wife.  Elderly. HEENT: [place] AT, moist mucus membranes.  Trachea midline, no  masses. Cardiovascular: No significant lower extremity edema, clubbing, or cyanosis today. Respiratory: Normal respiratory effort, no increased work of breathing.  GI: Abdomen is soft, nontender, nondistended, no abdominal masses GU: Suprapubic tube site clean dry and intact. Skin: No rashes, bruises or suspicious lesions. Neurologic: Grossly intact, no focal deficits, moving all 4 extremities. Psychiatric: Normal mood and affect.  Laboratory Data: Cr 2.1 on 06/23/16  Lab Results  Component Value Date   HGBA1C 7.3 (H) 04/12/2016    Urinalysis N/a   Imaging: Results for orders placed or performed in visit on 09/17/16  Bladder Scan (Post Void Residual) in office  Result Value Ref Range   Scan Result 119       Assessment & Plan:    1. Benign prostatic hyperplasia with urinary retention Chronic retention s/p multiple failed VT in past now s/p channel Thulium laser at time of stone surgery  Voiding fairly well with adequate PVR today, 119 cc Continue flomax/ finasteride Will follow with PVR, Cr, symptom recheck  2. Urinary frequency Likely multi-factorial Behavioral modification discussed Hesitant to start any anticholinergics or beta 3 agonists given concern for retention  3. History of kidney stones No residual stones or hydro on most recent RUS 12/17 KUB in 6 moths  Return in about 6 months (around 03/20/2017) for KUB, PVR, IPSS.  [name], MD  [facility] [address] [place], [place] [address] [phone]  I spent 25 min with this patient of which greater than 50% was spent in counseling and coordination of care with the patient.

## 2016-10-27 ENCOUNTER — Other Ambulatory Visit: Payer: Self-pay

## 2016-10-27 NOTE — Progress Notes (Signed)
Cardiology Office Note  Date:  10/29/2016   ID:  Jahod Radde, DOB 1928-04-18, MRN [id number]  PCP:  Lauro Regulus., MD   Chief Complaint  Patient presents with  . other    6 month f/u no complaints today. Meds reviewed verbally with pt.    HPI:  Mr Schaut is a 83 year old gentleman With a history of CAD, angioplasty in August 1996. PTCA of a diagonal branch and OM in 1996 OSA, not on CPAP hospitalization 07/07/2013 for diverticular bleed,  atrial fibrillation.   not started on anticoagulation  given his GI bleeding and anemia. As an outpatient, started  on eliquis 2.5 mgrams twice a day and was stable Anticoagulation held for hospital admission 11/22/2015 for profound anemia, hemoglobin 4.1   atrial fibrillation on today's visit   he reports having significant prostate issues in 2017  required indwelling catheter,  followed by suprapubic catheter  managed by urology   denies any recent symptoms   reports that he is still taking Lasix 80 in the Am and 40 in the PM Weight 165 to 170  does not take metolazone  denies any recent bleeding   legs are weak, no regular exercise   lab work reviewed with him in detail CR 2.0   recent EKGs below 05/31/2016: NSR 03/2016: NSR 02/2016: nsr EKG today personally reviewed by myself showing atrial fibrillation with ventricular rate 52 bpm  carvedilol  Previously held for bradycardia Denies any significant leg edema  Other past medical history reviewed  Previous hospitalization May 2017 for anemia,  Required transfusion 4 in the hospital, all blood thinners were held nuclear medicine GI bleeding scan which was negative for GI bleed.  Other hospital complications include urine retention causing hydronephrosis, renal failure with creatinine greater than 2. He required Foley catheter placement   Not wearing compression hose or leg wraps  Previously had problems on Lipitor, muscle ache. Also recently stopped  lovastatin. Currently not on any cholesterol medication Previous blood showing creatinine 1.6, BUN 29, hemoglobin A1c 7.6 Carvedilol previously decreased for bradycardia.   dizziness while playing golf 05/16/2013. He felt he was having the flu. Seen by PMD and given prednisone with antibiotics.  Developed blood per rectum in January 2015 . He was anemic in the hospital, hemoglobin in the 9 range  echocardiogram 06/26/2013 showing ejection fraction 45-50%, right ventricular systolic pressure 68 mmHg. He was instructed to increase his torsemide to 40 mg alternating with 20 mg Repeat echocardiogram July 08 2013 showed right ventricular systolic pressure 49, normal ejection fraction  normal stress test March 2009, normal ejection fraction by echocardiogram March 2009 He had a PTCA of a diagonal branch and OM in 1996  PMH:   has a past medical history of Anemia; BPH (benign prostatic hypertrophy); Cardiomyopathy (HCC); Coronary artery disease; Diverticulosis; Essential hypertension, benign; GI bleed (2015); History of kidney stones; Hypersomnia with sleep apnea, unspecified; MI (myocardial infarction) (HCC) (1996); Occasional tremors; Other and unspecified hyperlipidemia; Pure hypercholesterolemia; Sleep apnea; and Type II or unspecified type diabetes mellitus without mention of complication, uncontrolled.  PSH:    Past Surgical History:  Procedure Laterality Date  . CARDIAC CATHETERIZATION    . CHOLECYSTECTOMY  1994  . CORONARY ANGIOPLASTY  1996   [place]  . CYSTOSCOPY W/ URETERAL STENT PLACEMENT Left 05/31/2016   Procedure: CYSTOSCOPY WITH STENT REPLACEMENT;  Surgeon: [name], MD;  Location: [facility];  Service: Urology;  Laterality: Left;  . CYSTOSCOPY WITH LITHOLAPAXY N/A 05/11/2016   Procedure: CYSTOSCOPY WITH  LITHOLAPAXY;  Surgeon: [name], MD;  Location: [facility];  Service: Urology;  Laterality: N/A;  . CYSTOSCOPY/URETEROSCOPY/HOLMIUM LASER/STENT PLACEMENT Left  05/11/2016   Procedure: CYSTOSCOPY/URETEROSCOPY/HOLMIUM LASER/STENT PLACEMENT;  Surgeon: [name], MD;  Location: [facility];  Service: Urology;  Laterality: Left;  . ESOPHAGOGASTRODUODENOSCOPY (EGD) WITH PROPOFOL Left 11/23/2015   Procedure: ESOPHAGOGASTRODUODENOSCOPY (EGD) WITH PROPOFOL;  Surgeon: [name], MD;  Location: [facility];  Service: Endoscopy;  Laterality: Left;  . EYE SURGERY Bilateral    Cataract Extraction with IOL   . FLEXIBLE SIGMOIDOSCOPY    . HERNIA REPAIR Left    Inguinal Hernia Repair  . inguinal hernia repair     left inguinal   . INSERTION OF SUPRAPUBIC CATHETER N/A 05/31/2016   Procedure: INSERTION OF SUPRAPUBIC CATHETER;  Surgeon: [name], MD;  Location: [facility];  Service: Urology;  Laterality: N/A;  . IR GENERIC HISTORICAL  04/12/2016   IR NEPHROSTOMY PLACEMENT LEFT 04/12/2016 [name], MD [facility]  . KNEE ARTHROSCOPY Left   . NEPHROSTOMY  05/11/2016   Procedure: NEPHROSTOMY tude removal;  Surgeon: [name], MD;  Location: [facility];  Service: Urology;;  . TONSILLECTOMY    . URETEROSCOPY  05/31/2016   Procedure: URETEROSCOPY;  Surgeon: [name], MD;  Location: [facility];  Service: Urology;;    Current Outpatient Prescriptions  Medication Sig Dispense Refill  . Coenzyme Q10 (CO Q 10) 10 MG CAPS Take 1 tablet by mouth daily.     Marland Kitchen docusate sodium (COLACE) 100 MG capsule Take 1 capsule (100 mg total) by mouth 2 (two) times daily as needed (take to keep stool soft.). 60 capsule 0  . ferrous sulfate 325 (65 FE) MG tablet Take 325 mg by mouth daily with breakfast.    . finasteride (PROSCAR) 5 MG tablet Take 1 tablet (5 mg total) by mouth daily. 90 tablet 3  . furosemide (LASIX) 40 MG tablet Take 40-80 mg by mouth 2 (two) times daily. Take 2 tablets every am & 1 tablet every pm.     . glimepiride (AMARYL) 1 MG tablet Take 1 mg by mouth daily with breakfast.     . HUMALOG MIX 75/25 KWIKPEN (75-25) 100 UNIT/ML Kwikpen   0  .  losartan (COZAAR) 100 MG tablet Take 100 mg by mouth daily.  0  . Magnesium 250 MG TABS Take by mouth daily.    . metolazone (ZAROXOLYN) 5 MG tablet Take 5 mg by mouth as needed. Reported on 12/15/2015    . tamsulosin (FLOMAX) 0.4 MG CAPS capsule Take 1 capsule (0.4 mg total) by mouth daily. 90 capsule 3   No current facility-administered medications for this visit.      Allergies:   Exforge [amlodipine besylate-valsartan]; Lisinopril; and Niacin and related   Social History:  The patient  reports that he has quit smoking. His smoking use included Cigarettes. He has a 50.00 pack-year smoking history. He has never used smokeless tobacco. He reports that he does not drink alcohol or use drugs.   Family History:   family history includes Alzheimer's disease in his father; Rheum arthritis in his mother.    Review of Systems: Review of Systems  Constitutional: Positive for weight loss.  Respiratory: Negative.   Cardiovascular: Negative.   Gastrointestinal: Negative.   Genitourinary:       Urine retention  Musculoskeletal: Negative.   Neurological: Negative.   Psychiatric/Behavioral: Negative.   All other systems reviewed and are negative.    PHYSICAL EXAM: VS:  BP 134/64 (  BP Location: Left Arm, Patient Position: Sitting, Cuff Size: Normal)   Pulse (!) 52   Ht 5\' 5"  (1.651 m)   Wt 170 lb 8 oz (77.3 kg)   BMI 28.37 kg/m  , BMI Body mass index is 28.37 kg/m. GEN: Well nourished, well developed, in no acute distress, obese  HEENT: normal  Neck: no JVD, carotid bruits, or masses Cardiac:  Irregularly irregular, whomno murmurs, rubs, or gallops,no edema  Respiratory:  clear to auscultation bilaterally, normal work of breathing GI: soft, nontender, nondistended, + BS MS: no deformity or atrophy  Skin: warm and dry, no rash Neuro:  Strength and sensation are intact Psych: euthymic mood, full affect    Recent Labs: 04/12/2016: ALT 12 04/16/2016: Hemoglobin 9.7; Platelets  151 04/30/2016: BUN 53; Creatinine, Ser 2.77; Potassium 3.4; Sodium 128    Lipid Panel No results found for: CHOL, HDL, LDLCALC, TRIG    Wt Readings from Last 3 Encounters:  10/29/16 170 lb 8 oz (77.3 kg)  09/17/16 169 lb 4.8 oz (76.8 kg)  08/13/16 167 lb 4.8 oz (75.9 kg)       ASSESSMENT AND PLAN:   Persistent atrial fibrillation (HCC) - Plan: EKG 12-Lead  previous EKG showing normal sinus rhythm  he is back in atrial fibrillation,  Asymptomatic  poor candidate for anticoagulation given previous GI bleeds  long discussion with him today concerning Watchman  Device.  Not interested at this time but we have recommended he do some research about the device home  Cardiomyopathy [facility]) - Plan: EKG 12-Lead Appears relatively euvolemic  tolerating ARB  recommended he continue current diuretic regiment  Chronic diastolic CHF (congestive heart failure) (HCC) - Plan: EKG 12-Lead Recent climb in BUN and creatinine. He is not taking metolazone as he has no leg edema Encouraged him to skip diuretics on Sundays Alternatively he could hold the afternoon Lasix, stay on his current dose in the mornings  Essential hypertension Blood pressure is well controlled on today's visit. No changes made to the medications. home  Type 2 diabetes mellitus with complication, without long-term current use of insulin (HCC) We have encouraged continued exercise, careful diet management in an effort to lose weight.  Lower GI bleed Currently not on anticoagulation given previous history of GI bleed Risk of stroke discussed with him   encouraged him to consider the watchman  device  Bradycardia  asymptomatic bradycardia  discussed with him in detail    Total encounter time more than 25 minutes  Greater than 50% was spent in counseling and coordination of care with the patient   Disposition:   F/U  6 months   Orders Placed This Encounter  Procedures  . EKG 12-Lead     Signed, Dossie Arbour,  M.D., Ph.D. 10/29/2016  [facility] [place], [place] [phone]

## 2016-10-29 ENCOUNTER — Ambulatory Visit (INDEPENDENT_AMBULATORY_CARE_PROVIDER_SITE_OTHER): Payer: Medicare Other | Admitting: Cardiovascular Disease

## 2016-10-29 ENCOUNTER — Encounter: Payer: Self-pay | Admitting: Cardiovascular Disease

## 2016-10-29 VITALS — BP 134/64 | HR 52 | Ht 65.0 in | Wt 170.5 lb

## 2016-10-29 DIAGNOSIS — K922 Gastrointestinal hemorrhage, unspecified: Secondary | ICD-10-CM

## 2016-10-29 DIAGNOSIS — I5032 Chronic diastolic (congestive) heart failure: Secondary | ICD-10-CM

## 2016-10-29 DIAGNOSIS — E1159 Type 2 diabetes mellitus with other circulatory complications: Secondary | ICD-10-CM

## 2016-10-29 DIAGNOSIS — I480 Paroxysmal atrial fibrillation: Secondary | ICD-10-CM | POA: Diagnosis not present

## 2016-10-29 DIAGNOSIS — I25118 Atherosclerotic heart disease of native coronary artery with other forms of angina pectoris: Secondary | ICD-10-CM

## 2016-10-29 NOTE — Patient Instructions (Signed)
Medication Instructions:   No medication changes made  Consider starting aspirin 81 mg daily  Labwork:  No new labs needed  Testing/Procedures:  No further testing at this time   I recommend watching educational videos on topics of interest to you at:       [email or web address]  Enter code: [email or web address]    Follow-Up: It was a pleasure seeing you in the office today. Please call us if you have new issues that need to be addressed before your next appt.  [phone]  Your physician wants you to follow-up in: 6 months.  You will receive a reminder letter in the mail two months in advance. If you don't receive a letter, please call our office to schedule the follow-up appointment.  If you need a refill on your cardiac medications before your next appointment, please call your pharmacy.

## 2016-11-09 ENCOUNTER — Other Ambulatory Visit: Payer: Self-pay

## 2016-11-09 DIAGNOSIS — N401 Enlarged prostate with lower urinary tract symptoms: Secondary | ICD-10-CM

## 2016-11-09 MED ORDER — FINASTERIDE 5 MG PO TABS
5.0000 mg | ORAL_TABLET | Freq: Every day | ORAL | 3 refills | Status: AC
Start: 2016-11-09 — End: ?

## 2016-11-23 ENCOUNTER — Emergency Department (HOSPITAL_COMMUNITY): Payer: Medicare Other

## 2016-11-23 ENCOUNTER — Inpatient Hospital Stay (HOSPITAL_COMMUNITY)
Admission: EM | Admit: 2016-11-23 | Discharge: 2016-12-26 | DRG: 023 | Disposition: E | Payer: Medicare Other | Attending: Neurology | Admitting: Neurology

## 2016-11-23 ENCOUNTER — Inpatient Hospital Stay (HOSPITAL_COMMUNITY): Payer: Medicare Other

## 2016-11-23 ENCOUNTER — Encounter (HOSPITAL_COMMUNITY): Payer: Self-pay | Admitting: Emergency Medicine

## 2016-11-23 ENCOUNTER — Emergency Department (HOSPITAL_COMMUNITY): Payer: Medicare Other | Admitting: Certified Registered Nurse Anesthetist

## 2016-11-23 ENCOUNTER — Encounter (HOSPITAL_COMMUNITY): Admission: EM | Disposition: E | Payer: Self-pay | Source: Home / Self Care | Attending: Neurology

## 2016-11-23 DIAGNOSIS — I619 Nontraumatic intracerebral hemorrhage, unspecified: Secondary | ICD-10-CM | POA: Diagnosis present

## 2016-11-23 DIAGNOSIS — Z09 Encounter for follow-up examination after completed treatment for conditions other than malignant neoplasm: Secondary | ICD-10-CM

## 2016-11-23 DIAGNOSIS — R509 Fever, unspecified: Secondary | ICD-10-CM

## 2016-11-23 DIAGNOSIS — I6340 Cerebral infarction due to embolism of unspecified cerebral artery: Secondary | ICD-10-CM | POA: Insufficient documentation

## 2016-11-23 DIAGNOSIS — R414 Neurologic neglect syndrome: Secondary | ICD-10-CM | POA: Diagnosis present

## 2016-11-23 DIAGNOSIS — E872 Acidosis: Secondary | ICD-10-CM | POA: Diagnosis not present

## 2016-11-23 DIAGNOSIS — R339 Retention of urine, unspecified: Secondary | ICD-10-CM | POA: Diagnosis not present

## 2016-11-23 DIAGNOSIS — Z515 Encounter for palliative care: Secondary | ICD-10-CM | POA: Diagnosis not present

## 2016-11-23 DIAGNOSIS — N185 Chronic kidney disease, stage 5: Secondary | ICD-10-CM | POA: Diagnosis present

## 2016-11-23 DIAGNOSIS — I360 Nonrheumatic tricuspid (valve) stenosis: Secondary | ICD-10-CM | POA: Diagnosis not present

## 2016-11-23 DIAGNOSIS — I482 Chronic atrial fibrillation: Secondary | ICD-10-CM | POA: Diagnosis present

## 2016-11-23 DIAGNOSIS — E663 Overweight: Secondary | ICD-10-CM | POA: Diagnosis present

## 2016-11-23 DIAGNOSIS — G8194 Hemiplegia, unspecified affecting left nondominant side: Secondary | ICD-10-CM | POA: Diagnosis present

## 2016-11-23 DIAGNOSIS — I255 Ischemic cardiomyopathy: Secondary | ICD-10-CM | POA: Diagnosis not present

## 2016-11-23 DIAGNOSIS — R001 Bradycardia, unspecified: Secondary | ICD-10-CM | POA: Diagnosis not present

## 2016-11-23 DIAGNOSIS — N183 Chronic kidney disease, stage 3 (moderate): Secondary | ICD-10-CM | POA: Diagnosis not present

## 2016-11-23 DIAGNOSIS — E1151 Type 2 diabetes mellitus with diabetic peripheral angiopathy without gangrene: Secondary | ICD-10-CM | POA: Diagnosis present

## 2016-11-23 DIAGNOSIS — I6300 Cerebral infarction due to thrombosis of unspecified precerebral artery: Secondary | ICD-10-CM

## 2016-11-23 DIAGNOSIS — Z452 Encounter for adjustment and management of vascular access device: Secondary | ICD-10-CM

## 2016-11-23 DIAGNOSIS — I2510 Atherosclerotic heart disease of native coronary artery without angina pectoris: Secondary | ICD-10-CM | POA: Diagnosis present

## 2016-11-23 DIAGNOSIS — R310 Gross hematuria: Secondary | ICD-10-CM

## 2016-11-23 DIAGNOSIS — Z0189 Encounter for other specified special examinations: Secondary | ICD-10-CM

## 2016-11-23 DIAGNOSIS — I63511 Cerebral infarction due to unspecified occlusion or stenosis of right middle cerebral artery: Secondary | ICD-10-CM | POA: Diagnosis present

## 2016-11-23 DIAGNOSIS — E785 Hyperlipidemia, unspecified: Secondary | ICD-10-CM | POA: Diagnosis present

## 2016-11-23 DIAGNOSIS — I480 Paroxysmal atrial fibrillation: Secondary | ICD-10-CM | POA: Diagnosis present

## 2016-11-23 DIAGNOSIS — E1122 Type 2 diabetes mellitus with diabetic chronic kidney disease: Secondary | ICD-10-CM | POA: Diagnosis present

## 2016-11-23 DIAGNOSIS — R29715 NIHSS score 15: Secondary | ICD-10-CM | POA: Diagnosis present

## 2016-11-23 DIAGNOSIS — Z9049 Acquired absence of other specified parts of digestive tract: Secondary | ICD-10-CM

## 2016-11-23 DIAGNOSIS — R34 Anuria and oliguria: Secondary | ICD-10-CM | POA: Diagnosis not present

## 2016-11-23 DIAGNOSIS — Z888 Allergy status to other drugs, medicaments and biological substances status: Secondary | ICD-10-CM

## 2016-11-23 DIAGNOSIS — I63411 Cerebral infarction due to embolism of right middle cerebral artery: Secondary | ICD-10-CM

## 2016-11-23 DIAGNOSIS — E870 Hyperosmolality and hypernatremia: Secondary | ICD-10-CM | POA: Diagnosis not present

## 2016-11-23 DIAGNOSIS — N401 Enlarged prostate with lower urinary tract symptoms: Secondary | ICD-10-CM | POA: Diagnosis not present

## 2016-11-23 DIAGNOSIS — Z87442 Personal history of urinary calculi: Secondary | ICD-10-CM

## 2016-11-23 DIAGNOSIS — I509 Heart failure, unspecified: Secondary | ICD-10-CM

## 2016-11-23 DIAGNOSIS — R570 Cardiogenic shock: Secondary | ICD-10-CM | POA: Diagnosis not present

## 2016-11-23 DIAGNOSIS — I638 Other cerebral infarction: Secondary | ICD-10-CM | POA: Diagnosis not present

## 2016-11-23 DIAGNOSIS — I132 Hypertensive heart and chronic kidney disease with heart failure and with stage 5 chronic kidney disease, or end stage renal disease: Secondary | ICD-10-CM | POA: Diagnosis present

## 2016-11-23 DIAGNOSIS — M1A9XX0 Chronic gout, unspecified, without tophus (tophi): Secondary | ICD-10-CM | POA: Diagnosis present

## 2016-11-23 DIAGNOSIS — N179 Acute kidney failure, unspecified: Secondary | ICD-10-CM | POA: Diagnosis present

## 2016-11-23 DIAGNOSIS — I2119 ST elevation (STEMI) myocardial infarction involving other coronary artery of inferior wall: Secondary | ICD-10-CM | POA: Diagnosis not present

## 2016-11-23 DIAGNOSIS — R338 Other retention of urine: Secondary | ICD-10-CM | POA: Diagnosis present

## 2016-11-23 DIAGNOSIS — R579 Shock, unspecified: Secondary | ICD-10-CM | POA: Diagnosis not present

## 2016-11-23 DIAGNOSIS — G4733 Obstructive sleep apnea (adult) (pediatric): Secondary | ICD-10-CM | POA: Diagnosis present

## 2016-11-23 DIAGNOSIS — R233 Spontaneous ecchymoses: Secondary | ICD-10-CM | POA: Diagnosis present

## 2016-11-23 DIAGNOSIS — I5032 Chronic diastolic (congestive) heart failure: Secondary | ICD-10-CM | POA: Diagnosis present

## 2016-11-23 DIAGNOSIS — J9601 Acute respiratory failure with hypoxia: Secondary | ICD-10-CM

## 2016-11-23 DIAGNOSIS — Z961 Presence of intraocular lens: Secondary | ICD-10-CM | POA: Diagnosis present

## 2016-11-23 DIAGNOSIS — E119 Type 2 diabetes mellitus without complications: Secondary | ICD-10-CM | POA: Diagnosis not present

## 2016-11-23 DIAGNOSIS — R0682 Tachypnea, not elsewhere classified: Secondary | ICD-10-CM | POA: Diagnosis not present

## 2016-11-23 DIAGNOSIS — Q251 Coarctation of aorta: Secondary | ICD-10-CM

## 2016-11-23 DIAGNOSIS — D62 Acute posthemorrhagic anemia: Secondary | ICD-10-CM | POA: Diagnosis not present

## 2016-11-23 DIAGNOSIS — Z66 Do not resuscitate: Secondary | ICD-10-CM | POA: Diagnosis not present

## 2016-11-23 DIAGNOSIS — R29810 Facial weakness: Secondary | ICD-10-CM | POA: Diagnosis present

## 2016-11-23 DIAGNOSIS — D500 Iron deficiency anemia secondary to blood loss (chronic): Secondary | ICD-10-CM | POA: Diagnosis not present

## 2016-11-23 DIAGNOSIS — E860 Dehydration: Secondary | ICD-10-CM | POA: Diagnosis not present

## 2016-11-23 DIAGNOSIS — E876 Hypokalemia: Secondary | ICD-10-CM | POA: Diagnosis present

## 2016-11-23 DIAGNOSIS — I959 Hypotension, unspecified: Secondary | ICD-10-CM | POA: Diagnosis not present

## 2016-11-23 DIAGNOSIS — R319 Hematuria, unspecified: Secondary | ICD-10-CM | POA: Diagnosis not present

## 2016-11-23 DIAGNOSIS — Z794 Long term (current) use of insulin: Secondary | ICD-10-CM

## 2016-11-23 DIAGNOSIS — R6510 Systemic inflammatory response syndrome (SIRS) of non-infectious origin without acute organ dysfunction: Secondary | ICD-10-CM | POA: Diagnosis not present

## 2016-11-23 DIAGNOSIS — R9431 Abnormal electrocardiogram [ECG] [EKG]: Secondary | ICD-10-CM | POA: Diagnosis not present

## 2016-11-23 DIAGNOSIS — I219 Acute myocardial infarction, unspecified: Secondary | ICD-10-CM

## 2016-11-23 DIAGNOSIS — R471 Dysarthria and anarthria: Secondary | ICD-10-CM | POA: Diagnosis present

## 2016-11-23 DIAGNOSIS — Z01818 Encounter for other preprocedural examination: Secondary | ICD-10-CM

## 2016-11-23 DIAGNOSIS — I6310 Cerebral infarction due to embolism of unspecified precerebral artery: Secondary | ICD-10-CM | POA: Diagnosis not present

## 2016-11-23 DIAGNOSIS — I63311 Cerebral infarction due to thrombosis of right middle cerebral artery: Secondary | ICD-10-CM | POA: Diagnosis not present

## 2016-11-23 DIAGNOSIS — I252 Old myocardial infarction: Secondary | ICD-10-CM | POA: Diagnosis not present

## 2016-11-23 DIAGNOSIS — I10 Essential (primary) hypertension: Secondary | ICD-10-CM | POA: Diagnosis not present

## 2016-11-23 DIAGNOSIS — I639 Cerebral infarction, unspecified: Secondary | ICD-10-CM | POA: Diagnosis present

## 2016-11-23 DIAGNOSIS — Z87891 Personal history of nicotine dependence: Secondary | ICD-10-CM

## 2016-11-23 DIAGNOSIS — I4891 Unspecified atrial fibrillation: Secondary | ICD-10-CM | POA: Diagnosis not present

## 2016-11-23 DIAGNOSIS — R4189 Other symptoms and signs involving cognitive functions and awareness: Secondary | ICD-10-CM | POA: Diagnosis present

## 2016-11-23 DIAGNOSIS — E7800 Pure hypercholesterolemia, unspecified: Secondary | ICD-10-CM | POA: Diagnosis present

## 2016-11-23 DIAGNOSIS — Z79899 Other long term (current) drug therapy: Secondary | ICD-10-CM

## 2016-11-23 DIAGNOSIS — E1159 Type 2 diabetes mellitus with other circulatory complications: Secondary | ICD-10-CM | POA: Diagnosis not present

## 2016-11-23 DIAGNOSIS — R680 Hypothermia, not associated with low environmental temperature: Secondary | ICD-10-CM | POA: Diagnosis not present

## 2016-11-23 DIAGNOSIS — Z6828 Body mass index (BMI) 28.0-28.9, adult: Secondary | ICD-10-CM

## 2016-11-23 DIAGNOSIS — N421 Congestion and hemorrhage of prostate: Secondary | ICD-10-CM | POA: Diagnosis present

## 2016-11-23 DIAGNOSIS — J9600 Acute respiratory failure, unspecified whether with hypoxia or hypercapnia: Secondary | ICD-10-CM | POA: Diagnosis not present

## 2016-11-23 HISTORY — PX: IR PERCUTANEOUS ART THROMBECTOMY/INFUSION INTRACRANIAL INC DIAG ANGIO: IMG6087

## 2016-11-23 HISTORY — PX: IR INTRA CRAN STENT: IMG2345

## 2016-11-23 HISTORY — PX: RADIOLOGY WITH ANESTHESIA: SHX6223

## 2016-11-23 LAB — CBC WITH DIFFERENTIAL/PLATELET
BASOS PCT: 0 %
Basophils Absolute: 0 10*3/uL (ref 0.0–0.1)
EOS ABS: 0.2 10*3/uL (ref 0.0–0.7)
EOS PCT: 3 %
HCT: 30.6 % — ABNORMAL LOW (ref 39.0–52.0)
HEMOGLOBIN: 9.6 g/dL — AB (ref 13.0–17.0)
Lymphocytes Relative: 15 %
Lymphs Abs: 0.9 10*3/uL (ref 0.7–4.0)
MCH: 28.2 pg (ref 26.0–34.0)
MCHC: 31.4 g/dL (ref 30.0–36.0)
MCV: 89.7 fL (ref 78.0–100.0)
Monocytes Absolute: 0.6 10*3/uL (ref 0.1–1.0)
Monocytes Relative: 10 %
NEUTROS PCT: 72 %
Neutro Abs: 4.3 10*3/uL (ref 1.7–7.7)
PLATELETS: 151 10*3/uL (ref 150–400)
RBC: 3.41 MIL/uL — AB (ref 4.22–5.81)
RDW: 15.1 % (ref 11.5–15.5)
WBC: 6 10*3/uL (ref 4.0–10.5)

## 2016-11-23 LAB — BLOOD GAS, ARTERIAL
Acid-base deficit: 0.8 mmol/L (ref 0.0–2.0)
Bicarbonate: 22.8 mmol/L (ref 20.0–28.0)
Drawn by: 406621
FIO2: 100
MECHVT: 550 mL
O2 Saturation: 99.6 %
PEEP: 5 cmH2O
Patient temperature: 98.6
RATE: 12 resp/min
pCO2 arterial: 34.2 mmHg (ref 32.0–48.0)
pH, Arterial: 7.439 (ref 7.350–7.450)
pO2, Arterial: 387 mmHg — ABNORMAL HIGH (ref 83.0–108.0)

## 2016-11-23 LAB — CBC
HCT: 36.9 % — ABNORMAL LOW (ref 39.0–52.0)
Hemoglobin: 11.9 g/dL — ABNORMAL LOW (ref 13.0–17.0)
MCH: 29 pg (ref 26.0–34.0)
MCHC: 32.2 g/dL (ref 30.0–36.0)
MCV: 89.8 fL (ref 78.0–100.0)
PLATELETS: 173 10*3/uL (ref 150–400)
RBC: 4.11 MIL/uL — AB (ref 4.22–5.81)
RDW: 15.2 % (ref 11.5–15.5)
WBC: 7.4 10*3/uL (ref 4.0–10.5)

## 2016-11-23 LAB — DIFFERENTIAL
Basophils Absolute: 0 10*3/uL (ref 0.0–0.1)
Basophils Relative: 0 %
EOS PCT: 7 %
Eosinophils Absolute: 0.5 10*3/uL (ref 0.0–0.7)
LYMPHS PCT: 25 %
Lymphs Abs: 1.8 10*3/uL (ref 0.7–4.0)
Monocytes Absolute: 0.7 10*3/uL (ref 0.1–1.0)
Monocytes Relative: 9 %
NEUTROS ABS: 4.3 10*3/uL (ref 1.7–7.7)
NEUTROS PCT: 59 %

## 2016-11-23 LAB — I-STAT CHEM 8, ED
BUN: 33 mg/dL — AB (ref 6–20)
CALCIUM ION: 1.02 mmol/L — AB (ref 1.15–1.40)
CHLORIDE: 106 mmol/L (ref 101–111)
Creatinine, Ser: 1.9 mg/dL — ABNORMAL HIGH (ref 0.61–1.24)
Glucose, Bld: 165 mg/dL — ABNORMAL HIGH (ref 65–99)
HEMATOCRIT: 36 % — AB (ref 39.0–52.0)
Hemoglobin: 12.2 g/dL — ABNORMAL LOW (ref 13.0–17.0)
POTASSIUM: 3.4 mmol/L — AB (ref 3.5–5.1)
SODIUM: 142 mmol/L (ref 135–145)
TCO2: 24 mmol/L (ref 0–100)

## 2016-11-23 LAB — BASIC METABOLIC PANEL
Anion gap: 7 (ref 5–15)
BUN: 27 mg/dL — ABNORMAL HIGH (ref 6–20)
CO2: 21 mmol/L — ABNORMAL LOW (ref 22–32)
Calcium: 7.7 mg/dL — ABNORMAL LOW (ref 8.9–10.3)
Chloride: 107 mmol/L (ref 101–111)
Creatinine, Ser: 1.8 mg/dL — ABNORMAL HIGH (ref 0.61–1.24)
GFR calc Af Amer: 37 mL/min — ABNORMAL LOW (ref >60)
GFR calc non Af Amer: 32 mL/min — ABNORMAL LOW (ref >60)
Glucose, Bld: 147 mg/dL — ABNORMAL HIGH (ref 65–99)
Potassium: 4.1 mmol/L (ref 3.5–5.1)
Sodium: 135 mmol/L (ref 135–145)

## 2016-11-23 LAB — COMPREHENSIVE METABOLIC PANEL
ALK PHOS: 131 U/L — AB (ref 38–126)
ALT: 36 U/L (ref 17–63)
ANION GAP: 9 (ref 5–15)
AST: 42 U/L — ABNORMAL HIGH (ref 15–41)
Albumin: 3.4 g/dL — ABNORMAL LOW (ref 3.5–5.0)
BUN: 30 mg/dL — ABNORMAL HIGH (ref 6–20)
CALCIUM: 8.4 mg/dL — AB (ref 8.9–10.3)
CO2: 22 mmol/L (ref 22–32)
Chloride: 107 mmol/L (ref 101–111)
Creatinine, Ser: 1.85 mg/dL — ABNORMAL HIGH (ref 0.61–1.24)
GFR calc Af Amer: 36 mL/min — ABNORMAL LOW (ref >60)
GFR calc non Af Amer: 31 mL/min — ABNORMAL LOW (ref >60)
GLUCOSE: 166 mg/dL — AB (ref 65–99)
Potassium: 3.4 mmol/L — ABNORMAL LOW (ref 3.5–5.1)
SODIUM: 138 mmol/L (ref 135–145)
Total Bilirubin: 0.8 mg/dL (ref 0.3–1.2)
Total Protein: 7.7 g/dL (ref 6.5–8.1)

## 2016-11-23 LAB — PHOSPHORUS: Phosphorus: 3.6 mg/dL (ref 2.5–4.6)

## 2016-11-23 LAB — GLUCOSE, CAPILLARY
Glucose-Capillary: 117 mg/dL — ABNORMAL HIGH (ref 65–99)
Glucose-Capillary: 120 mg/dL — ABNORMAL HIGH (ref 65–99)
Glucose-Capillary: 155 mg/dL — ABNORMAL HIGH (ref 65–99)

## 2016-11-23 LAB — PLATELET INHIBITION P2Y12: PLATELET FUNCTION P2Y12: 357 [PRU] (ref 194–418)

## 2016-11-23 LAB — I-STAT TROPONIN, ED: Troponin i, poc: 0.01 ng/mL (ref 0.00–0.08)

## 2016-11-23 LAB — PROTIME-INR
INR: 1.1
PROTHROMBIN TIME: 14.2 s (ref 11.4–15.2)

## 2016-11-23 LAB — APTT: aPTT: 32 seconds (ref 24–36)

## 2016-11-23 LAB — TRIGLYCERIDES: TRIGLYCERIDES: 118 mg/dL (ref <150)

## 2016-11-23 LAB — MRSA PCR SCREENING: MRSA by PCR: NEGATIVE

## 2016-11-23 LAB — MAGNESIUM: Magnesium: 1.9 mg/dL (ref 1.7–2.4)

## 2016-11-23 SURGERY — RADIOLOGY WITH ANESTHESIA
Anesthesia: General

## 2016-11-23 MED ORDER — POTASSIUM CHLORIDE 10 MEQ/100ML IV SOLN
10.0000 meq | INTRAVENOUS | Status: AC
  Administered 2016-11-23 (×2): 10 meq via INTRAVENOUS
  Filled 2016-11-23 (×2): qty 100

## 2016-11-23 MED ORDER — SENNOSIDES-DOCUSATE SODIUM 8.6-50 MG PO TABS: 1.0000 | ORAL_TABLET | Freq: Every evening | ORAL | Status: DC | PRN

## 2016-11-23 MED ORDER — ORAL CARE MOUTH RINSE
15.0000 mL | Freq: Four times a day (QID) | OROMUCOSAL | Status: DC
  Administered 2016-11-23 – 2016-11-27 (×16): 15 mL via OROMUCOSAL

## 2016-11-23 MED ORDER — ACETAMINOPHEN 325 MG PO TABS
650.0000 mg | ORAL_TABLET | ORAL | Status: DC | PRN
Start: 2016-11-23 — End: 2016-11-23

## 2016-11-23 MED ORDER — LOSARTAN POTASSIUM 50 MG PO TABS
100.0000 mg | ORAL_TABLET | Freq: Every day | ORAL | Status: DC
  Filled 2016-11-23: qty 2

## 2016-11-23 MED ORDER — IOPAMIDOL (ISOVUE-300) INJECTION 61%
INTRAVENOUS | Status: AC
  Administered 2016-11-23: 45 mL
  Filled 2016-11-23: qty 150

## 2016-11-23 MED ORDER — IOPAMIDOL (ISOVUE-300) INJECTION 61%
INTRAVENOUS | Status: AC
  Administered 2016-11-23: 60 mL
  Filled 2016-11-23: qty 100

## 2016-11-23 MED ORDER — EPTIFIBATIDE 20 MG/10ML IV SOLN
INTRAVENOUS | Status: AC
  Filled 2016-11-23: qty 10

## 2016-11-23 MED ORDER — CLOPIDOGREL BISULFATE 300 MG PO TABS
ORAL_TABLET | ORAL | Status: AC
  Filled 2016-11-23: qty 1

## 2016-11-23 MED ORDER — INSULIN ASPART 100 UNIT/ML ~~LOC~~ SOLN
0.0000 [IU] | SUBCUTANEOUS | Status: DC
  Administered 2016-11-23: 3 [IU] via SUBCUTANEOUS
  Administered 2016-11-24: 2 [IU] via SUBCUTANEOUS
  Administered 2016-11-24 – 2016-11-25 (×3): 3 [IU] via SUBCUTANEOUS
  Administered 2016-11-25 (×2): 5 [IU] via SUBCUTANEOUS
  Administered 2016-11-25: 8 [IU] via SUBCUTANEOUS
  Administered 2016-11-25: 3 [IU] via SUBCUTANEOUS
  Administered 2016-11-25: 2 [IU] via SUBCUTANEOUS
  Administered 2016-11-26: 3 [IU] via SUBCUTANEOUS
  Administered 2016-11-26: 2 [IU] via SUBCUTANEOUS
  Administered 2016-11-26: 5 [IU] via SUBCUTANEOUS
  Administered 2016-11-26: 8 [IU] via SUBCUTANEOUS
  Administered 2016-11-27 (×2): 3 [IU] via SUBCUTANEOUS
  Administered 2016-11-27: 5 [IU] via SUBCUTANEOUS
  Administered 2016-11-27: 2 [IU] via SUBCUTANEOUS
  Administered 2016-11-27: 8 [IU] via SUBCUTANEOUS
  Administered 2016-11-27 – 2016-11-28 (×2): 5 [IU] via SUBCUTANEOUS
  Administered 2016-11-28 (×4): 3 [IU] via SUBCUTANEOUS
  Administered 2016-11-29: 2 [IU] via SUBCUTANEOUS
  Administered 2016-11-29 (×2): 3 [IU] via SUBCUTANEOUS
  Administered 2016-11-29: 2 [IU] via SUBCUTANEOUS
  Administered 2016-11-29: 8 [IU] via SUBCUTANEOUS
  Administered 2016-11-29 (×2): 3 [IU] via SUBCUTANEOUS
  Administered 2016-11-30 (×3): 5 [IU] via SUBCUTANEOUS
  Administered 2016-11-30: 3 [IU] via SUBCUTANEOUS
  Administered 2016-11-30: 8 [IU] via SUBCUTANEOUS

## 2016-11-23 MED ORDER — DOCUSATE SODIUM 100 MG PO CAPS: 100.0000 mg | ORAL_CAPSULE | Freq: Two times a day (BID) | ORAL | Status: DC | PRN

## 2016-11-23 MED ORDER — PANTOPRAZOLE SODIUM 40 MG IV SOLR
40.0000 mg | Freq: Every day | INTRAVENOUS | Status: DC
  Administered 2016-11-23 – 2016-11-30 (×8): 40 mg via INTRAVENOUS
  Filled 2016-11-23 (×9): qty 40

## 2016-11-23 MED ORDER — ACETAMINOPHEN 650 MG RE SUPP: 650.0000 mg | RECTAL | Status: DC | PRN

## 2016-11-23 MED ORDER — CLOPIDOGREL BISULFATE 75 MG PO TABS
75.0000 mg | ORAL_TABLET | Freq: Every day | ORAL | Status: DC
  Administered 2016-11-24: 75 mg via ORAL
  Filled 2016-11-23 (×2): qty 1

## 2016-11-23 MED ORDER — FINASTERIDE 5 MG PO TABS
5.0000 mg | ORAL_TABLET | Freq: Every day | ORAL | Status: DC
  Administered 2016-11-27 – 2016-11-30 (×4): 5 mg via ORAL
  Filled 2016-11-23 (×10): qty 1

## 2016-11-23 MED ORDER — SODIUM CHLORIDE 0.9 % IV SOLN
0.0000 ug/min | INTRAVENOUS | Status: DC
  Filled 2016-11-23: qty 1

## 2016-11-23 MED ORDER — STROKE: EARLY STAGES OF RECOVERY BOOK
Freq: Once | Status: AC
  Administered 2016-11-23: 16:00:00
  Filled 2016-11-23 (×2): qty 1

## 2016-11-23 MED ORDER — TAMSULOSIN HCL 0.4 MG PO CAPS
0.4000 mg | ORAL_CAPSULE | Freq: Every day | ORAL | Status: DC
  Administered 2016-11-27: 0.4 mg via ORAL
  Filled 2016-11-23 (×3): qty 1

## 2016-11-23 MED ORDER — ASPIRIN 325 MG PO TABS
ORAL_TABLET | ORAL | Status: AC
  Filled 2016-11-23: qty 1

## 2016-11-23 MED ORDER — IOPAMIDOL (ISOVUE-300) INJECTION 61%
INTRAVENOUS | Status: AC
  Administered 2016-11-23: 60 mL
  Filled 2016-11-23: qty 150

## 2016-11-23 MED ORDER — MIDAZOLAM HCL 2 MG/2ML IJ SOLN: 1.0000 mg | INTRAMUSCULAR | Status: DC | PRN

## 2016-11-23 MED ORDER — ACETAMINOPHEN 325 MG PO TABS: 650.0000 mg | ORAL_TABLET | ORAL | Status: DC | PRN

## 2016-11-23 MED ORDER — SODIUM CHLORIDE 0.9 % IV SOLN
INTRAVENOUS | Status: DC
  Administered 2016-11-23 – 2016-11-30 (×6): via INTRAVENOUS

## 2016-11-23 MED ORDER — MAGNESIUM 250 MG PO TABS: ORAL_TABLET | Freq: Every day | ORAL | Status: DC

## 2016-11-23 MED ORDER — FENTANYL CITRATE (PF) 100 MCG/2ML IJ SOLN
INTRAMUSCULAR | Status: DC | PRN
  Administered 2016-11-23: 100 ug via INTRAVENOUS

## 2016-11-23 MED ORDER — ROCURONIUM BROMIDE 100 MG/10ML IV SOLN
INTRAVENOUS | Status: DC | PRN
Start: 2016-11-23 — End: 2016-11-23
  Administered 2016-11-23 (×3): 50 mg via INTRAVENOUS

## 2016-11-23 MED ORDER — ACETAMINOPHEN 160 MG/5ML PO SOLN: 650.0000 mg | ORAL | Status: DC | PRN

## 2016-11-23 MED ORDER — CLOPIDOGREL BISULFATE 75 MG PO TABS
ORAL_TABLET | ORAL | Status: AC | PRN
  Administered 2016-11-23: 300 mg

## 2016-11-23 MED ORDER — PROPOFOL 10 MG/ML IV BOLUS
INTRAVENOUS | Status: DC | PRN
  Administered 2016-11-23: 100 mg via INTRAVENOUS

## 2016-11-23 MED ORDER — FENTANYL CITRATE (PF) 100 MCG/2ML IJ SOLN
25.0000 ug | INTRAMUSCULAR | Status: DC | PRN
  Administered 2016-11-24: 25 ug via INTRAVENOUS
  Filled 2016-11-23: qty 2

## 2016-11-23 MED ORDER — ONDANSETRON HCL 4 MG/2ML IJ SOLN: 4.0000 mg | Freq: Four times a day (QID) | INTRAMUSCULAR | Status: DC | PRN

## 2016-11-23 MED ORDER — ACETAMINOPHEN 160 MG/5ML PO SOLN
650.0000 mg | ORAL | Status: DC | PRN
  Administered 2016-11-29 – 2016-11-30 (×2): 650 mg
  Filled 2016-11-23 (×2): qty 20.3

## 2016-11-23 MED ORDER — FUROSEMIDE 80 MG PO TABS
80.0000 mg | ORAL_TABLET | Freq: Every day | ORAL | Status: DC
  Administered 2016-11-24: 80 mg via ORAL
  Filled 2016-11-23 (×2): qty 1

## 2016-11-23 MED ORDER — FENTANYL CITRATE (PF) 100 MCG/2ML IJ SOLN: 100.0000 ug | INTRAMUSCULAR | Status: DC | PRN

## 2016-11-23 MED ORDER — ASPIRIN 325 MG PO TABS
325.0000 mg | ORAL_TABLET | Freq: Every day | ORAL | Status: DC
  Administered 2016-11-24: 325 mg via ORAL
  Filled 2016-11-23 (×2): qty 1

## 2016-11-23 MED ORDER — GLYCOPYRROLATE 0.2 MG/ML IJ SOLN
INTRAMUSCULAR | Status: DC | PRN
  Administered 2016-11-23: .2 mg via INTRAVENOUS
  Administered 2016-11-23 (×2): 0.2 mg via INTRAVENOUS

## 2016-11-23 MED ORDER — CHLORHEXIDINE GLUCONATE 0.12% ORAL RINSE (MEDLINE KIT)
15.0000 mL | Freq: Two times a day (BID) | OROMUCOSAL | Status: DC
  Administered 2016-11-23 – 2016-11-30 (×16): 15 mL via OROMUCOSAL

## 2016-11-23 MED ORDER — ASPIRIN 325 MG PO TABS
ORAL_TABLET | ORAL | Status: AC | PRN
  Administered 2016-11-23: 325 mg

## 2016-11-23 MED ORDER — LABETALOL HCL 5 MG/ML IV SOLN: 5.0000 mg | INTRAVENOUS | Status: DC | PRN

## 2016-11-23 MED ORDER — NITROGLYCERIN 1 MG/10 ML FOR IR/CATH LAB
INTRA_ARTERIAL | Status: AC
  Filled 2016-11-23: qty 10

## 2016-11-23 MED ORDER — PROPOFOL 1000 MG/100ML IV EMUL
0.0000 ug/kg/min | INTRAVENOUS | Status: DC
  Administered 2016-11-23: 30 ug/kg/min via INTRAVENOUS
  Administered 2016-11-23: 35 ug/kg/min via INTRAVENOUS
  Administered 2016-11-24: 25 ug/kg/min via INTRAVENOUS
  Filled 2016-11-23 (×3): qty 100

## 2016-11-23 MED ORDER — SODIUM CHLORIDE 0.9 % IV SOLN
INTRAVENOUS | Status: AC
  Administered 2016-11-23 – 2016-11-24 (×2): via INTRAVENOUS

## 2016-11-23 MED ORDER — CEFAZOLIN SODIUM-DEXTROSE 1-4 GM/50ML-% IV SOLN
INTRAVENOUS | Status: DC | PRN
  Administered 2016-11-23: 2 g via INTRAVENOUS

## 2016-11-23 MED ORDER — LIDOCAINE HCL (CARDIAC) 20 MG/ML IV SOLN
INTRAVENOUS | Status: DC | PRN
  Administered 2016-11-23: 100 mg via INTRAVENOUS

## 2016-11-23 MED ORDER — LACTATED RINGERS IV SOLN
INTRAVENOUS | Status: DC | PRN
  Administered 2016-11-23: 09:00:00 via INTRAVENOUS

## 2016-11-23 MED ORDER — SODIUM CHLORIDE 0.9 % IJ SOLN
INTRAVENOUS | Status: AC | PRN
  Administered 2016-11-23: 25 ug via INTRA_ARTERIAL

## 2016-11-23 MED ORDER — HYDRALAZINE HCL 20 MG/ML IJ SOLN
10.0000 mg | INTRAMUSCULAR | Status: DC | PRN
  Administered 2016-11-23 – 2016-11-25 (×4): 20 mg via INTRAVENOUS
  Administered 2016-11-28: 10 mg via INTRAVENOUS
  Administered 2016-11-29: 20 mg via INTRAVENOUS
  Filled 2016-11-23 (×6): qty 1

## 2016-11-23 MED ORDER — PROPOFOL 500 MG/50ML IV EMUL
INTRAVENOUS | Status: DC | PRN
  Administered 2016-11-23: 50 ug/kg/min via INTRAVENOUS

## 2016-11-23 NOTE — Progress Notes (Signed)
eLink Physician-Brief Progress Note Patient Name: [name] DOB: 05-23-1928 MRN: [id number]   Date of Service  12/05/2016  HPI/Events of Note  Notified by bedside nurse of borderline hyperthermia. Warming blanket was placed. Patient now with borderline hypotension on propofol infusion. Has peripheral IV access only.   eICU Interventions  1. Weaning propofol infusion 2. Ordering Versed IV push as needed for maintenance of sedation 3. Adjusting fentanyl IV bolus for pain control 4. Ordering low-dose peripheral Neo-Synephrine to maintain goal systolic blood pressure      Intervention Category Major Interventions: Hypotension - evaluation and management  Lawanda Cousins 05-Dec-2016, 10:46 PM

## 2016-11-23 NOTE — ED Notes (Signed)
Escorted pt to MRI from CT.  Dr Otelia Limes to speak with family in ED room.

## 2016-11-23 NOTE — Consult Note (Signed)
Admission H&P    Chief Complaint: Acute onset left sided weakness   HPI: [name] is an 83 y.o. male who presents with acute onset left sided weakness with left facial droop, dysarthria and left hemineglect. LKN was 23:30 before going to bed. He woke up in the middle of the night to use the bathroom, but family is unsure of when. The patient was found by family this morning at 6:00 AM with dense left sided weakness, facial droop and dysarthria. He denied any deficit at that time, consistent with anosognosia/hemineglect. He was emergently transported to the ED as a Code Stroke.  PMHx includes CAD, cardiomyopathy, HTN, sleep apnea, MI, HLD and DM2. He has no prior history of stroke. Although atrial fibrillation is not listed in his PMHx in EPIC, EMS states that family told them that the patient has a hx of atrial fibrillation and was on Eliquis, but that this was stopped 2 months ago due to GI bleeding.   LSN: 23:30 tPA Given: No: Out of time window   Past Medical History:  Diagnosis Date  . Anemia   . BPH (benign prostatic hypertrophy)   . Cardiomyopathy (HCC)   . Coronary artery disease   . Diverticulosis   . Essential hypertension, benign   . GI bleed 2015  . History of kidney stones   . Hypersomnia with sleep apnea, unspecified   . MI (myocardial infarction) (HCC) 1996  . Occasional tremors   . Other and unspecified hyperlipidemia   . Pure hypercholesterolemia   . Sleep apnea    unable to use C-PAP  . Type II or unspecified type diabetes mellitus without mention of complication, uncontrolled     Past Surgical History:  Procedure Laterality Date  . CARDIAC CATHETERIZATION    . CHOLECYSTECTOMY  1994  . CORONARY ANGIOPLASTY  1996   [place]  . CYSTOSCOPY W/ URETERAL STENT PLACEMENT Left 05/31/2016   Procedure: CYSTOSCOPY WITH STENT REPLACEMENT;  Surgeon: [name], MD;  Location: [facility];  Service: Urology;  Laterality: Left;  . CYSTOSCOPY WITH LITHOLAPAXY N/A  05/11/2016   Procedure: CYSTOSCOPY WITH LITHOLAPAXY;  Surgeon: [name], MD;  Location: [facility];  Service: Urology;  Laterality: N/A;  . CYSTOSCOPY/URETEROSCOPY/HOLMIUM LASER/STENT PLACEMENT Left 05/11/2016   Procedure: CYSTOSCOPY/URETEROSCOPY/HOLMIUM LASER/STENT PLACEMENT;  Surgeon: [name], MD;  Location: [facility];  Service: Urology;  Laterality: Left;  . ESOPHAGOGASTRODUODENOSCOPY (EGD) WITH PROPOFOL Left 11/23/2015   Procedure: ESOPHAGOGASTRODUODENOSCOPY (EGD) WITH PROPOFOL;  Surgeon: [name], MD;  Location: [facility]COPY;  Service: Endoscopy;  Laterality: Left;  . EYE SURGERY Bilateral    Cataract Extraction with IOL   . FLEXIBLE SIGMOIDOSCOPY    . HERNIA REPAIR Left    Inguinal Hernia Repair  . inguinal hernia repair     left inguinal   . INSERTION OF SUPRAPUBIC CATHETER N/A 05/31/2016   Procedure: INSERTION OF SUPRAPUBIC CATHETER;  Surgeon: [name], MD;  Location: [facility];  Service: Urology;  Laterality: N/A;  . IR GENERIC HISTORICAL  04/12/2016   IR NEPHROSTOMY PLACEMENT LEFT 04/12/2016 [name], MD [facility]  . KNEE ARTHROSCOPY Left   . NEPHROSTOMY  05/11/2016   Procedure: NEPHROSTOMY tude removal;  Surgeon: [name], MD;  Location: [facility];  Service: Urology;;  . TONSILLECTOMY    . URETEROSCOPY  05/31/2016   Procedure: URETEROSCOPY;  Surgeon: [name], MD;  Location: [facility];  Service: Urology;;    Family History  Problem Relation Age of Onset  . Rheum arthritis Mother   .  Alzheimer's disease Father    Social History:  reports that he has quit smoking. His smoking use included Cigarettes. He has a 50.00 pack-year smoking history. He has never used smokeless tobacco. He reports that he does not drink alcohol or use drugs.  Allergies:  Allergies  Allergen Reactions  . Exforge [Amlodipine Besylate-Valsartan] Swelling and Other (See Comments)    Pt states that he is unable to swallow.    . Lisinopril Swelling and Other (See  Comments)    Pt states that he is unable to swallow.    . Niacin And Related Swelling and Other (See Comments)    Pt states that he is unable to swallow.     Home Medications: Coenzyme Q10 (CO Q 10) 10 MG CAPS Take 1 tablet by mouth daily.  [provider] Needs Review  docusate sodium (COLACE) 100 MG capsule Take 1 capsule (100 mg total) by mouth 2 (two) times daily as needed (take to keep stool soft.). [name], MD Needs Review  ferrous sulfate 325 (65 FE) MG tablet Take 325 mg by mouth daily with breakfast. [provider] Needs Review  finasteride (PROSCAR) 5 MG tablet Take 1 tablet (5 mg total) by mouth daily. [name], MD Needs Review  furosemide (LASIX) 40 MG tablet Take 40-80 mg by mouth 2 (two) times daily. Take 2 tablets every am & 1 tablet every pm.  [provider] Needs Review  glimepiride (AMARYL) 1 MG tablet Take 1 mg by mouth daily with breakfast.  [provider] Needs Review  HUMALOG MIX 75/25 KWIKPEN (75-25) 100 UNIT/ML Kwikpen  [provider] Needs Review  losartan (COZAAR) 100 MG tablet Take 100 mg by mouth daily. [provider] Needs Review  Magnesium 250 MG TABS Take by mouth daily. [provider] Needs Review  metolazone (ZAROXOLYN) 5 MG tablet Take 5 mg by mouth as needed. Reported on 12/15/2015 [provider] Needs Review  tamsulosin (FLOMAX) 0.4 MG CAPS capsule Take 1 capsule (0.4 mg total) by mouth daily. [name], MD Needs Review  The patient states that he also takes daily ASA.   ROS: Deferred on initial consult due to acuity of presentation.   Physical Examination: Blood pressure (!) 207/86, pulse (!) 52, resp. rate 17, height 5\' 5"  (1.651 m), weight 79.8 kg (175 lb 14.8 oz), SpO2 98 %.  HEENT-  [place]/AT  Lungs - Respirations unlabored Extremities - Mild pitting edema distal lower extremities bilaterally  Neurologic Examination: Mental Status: Awake and alert. Able  to answer all questions coherently with fluent speech. Prominent dysarthria. Follows all commands. Naming intact. Left hemineglect with anosognosia.  Cranial Nerves: II:  Left visual field cut. PERRL.  III,IV, VI: ptosis not presen; right gaze preference; able to conjugately deviate eyes across midline to left with difficulty. No nystagmus   V,VII: Left facial droop. Decreased FT sensation to left face.  VIII: hearing intact to voice IX,X: No hoarseness noted XI: Preferentially rotates head to right XII: midline tongue extension  Motor: LUE: Flaccid, 0/5 LLE: Decreased tone, 1-2/5 RUE and RLE: 5/5 Sensory: Decreased temp and FT sensation LUE and LLE. Positive extinction on left.  Deep Tendon Reflexes:  2+ bilateral upper extremities.  0 patellae and achilles bilaterally Toes equivocal Cerebellar: No ataxia with FNF on right. Unable to perform on left.  Gait: Unable to assess  NIHSS = 15  Results for orders placed or performed during the hospital encounter of 10/28/2016 (from the past 48 hour(s))  CBC  Status: Abnormal   Collection Time: 11/09/2016  7:17 AM  Result Value Ref Range   WBC 7.4 4.0 - 10.5 K/uL   RBC 4.11 (L) 4.22 - 5.81 MIL/uL   Hemoglobin 11.9 (L) 13.0 - 17.0 g/dL   HCT 16.1 (L) 09.6 - 04.5 %   MCV 89.8 78.0 - 100.0 fL   MCH 29.0 26.0 - 34.0 pg   MCHC 32.2 30.0 - 36.0 g/dL   RDW 40.9 81.1 - 91.4 %   Platelets 173 150 - 400 K/uL  Differential     Status: None   Collection Time: 11/01/2016  7:17 AM  Result Value Ref Range   Neutrophils Relative % 59 %   Neutro Abs 4.3 1.7 - 7.7 K/uL   Lymphocytes Relative 25 %   Lymphs Abs 1.8 0.7 - 4.0 K/uL   Monocytes Relative 9 %   Monocytes Absolute 0.7 0.1 - 1.0 K/uL   Eosinophils Relative 7 %   Eosinophils Absolute 0.5 0.0 - 0.7 K/uL   Basophils Relative 0 %   Basophils Absolute 0.0 0.0 - 0.1 K/uL  I-stat troponin, ED     Status: None   Collection Time: 11/22/2016  7:19 AM  Result Value Ref Range   Troponin i, poc 0.01  0.00 - 0.08 ng/mL   Comment 3            Comment: Due to the release kinetics of cTnI, a negative result within the first hours of the onset of symptoms does not rule out myocardial infarction with certainty. If myocardial infarction is still suspected, repeat the test at appropriate intervals.   I-Stat Chem 8, ED     Status: Abnormal   Collection Time: 11/14/2016  7:21 AM  Result Value Ref Range   Sodium 142 135 - 145 mmol/L   Potassium 3.4 (L) 3.5 - 5.1 mmol/L   Chloride 106 101 - 111 mmol/L   BUN 33 (H) 6 - 20 mg/dL   Creatinine, Ser 7.82 (H) 0.61 - 1.24 mg/dL   Glucose, Bld 956 (H) 65 - 99 mg/dL   Calcium, Ion 2.13 (L) 1.15 - 1.40 mmol/L   TCO2 24 0 - 100 mmol/L   Hemoglobin 12.2 (L) 13.0 - 17.0 g/dL   HCT 08.6 (L) 57.8 - 46.9 %   Ct Head Code Stroke W/o Cm  Result Date: 10/29/2016 CLINICAL DATA:  Code stroke. Left-sided weakness, facial droop, and slurred speech. EXAM: CT HEAD WITHOUT CONTRAST TECHNIQUE: Contiguous axial images were obtained from the base of the skull through the vertex without intravenous contrast. COMPARISON:  None. FINDINGS: Brain: There is mild motion artifact towards the vertex. High right frontal hypoattenuation on image 22 is favored to reflect motion artifact over early infarct based on reformats. There is however abnormal hypoattenuation in the right caudate and lentiform nuclei. No intracranial hemorrhage, mass, midline shift, or extra-axial fluid collection is identified. Mild cerebral atrophy is within normal limits for age. Subcortical and deep cerebral white matter hypodensities are nonspecific but compatible with moderate chronic small vessel ischemic disease. Lacunar infarcts are present in the deep cerebral white matter bilaterally and left basal ganglia, likely chronic. Vascular: Abnormal hyperdensity of the right MCA at the distal M1/bifurcation region. Calcified atherosclerosis at the skullbase. Skull: No fracture or focal osseous lesion.  Sinuses/Orbits: Chronic left maxillary sinusitis with sinus atelectasis and near complete opacification. Clear mastoid air cells. Bilateral cataract extraction. Other: None. ASPECTS [facility] Stroke Program Early CT Score) - Ganglionic level infarction (caudate, lentiform nuclei, internal capsule,  insula, M1-M3 cortex): 5 - Supraganglionic infarction (M4-M6 cortex): 3 Total score (0-10 with 10 being normal): 8 IMPRESSION: 1. Hyperdense distal right M1/ MCA bifurcation with evidence of acute infarction in the right basal ganglia. 2. ASPECTS is at best 8. 3. No acute intracranial hemorrhage. These results were called by telephone at the time of interpretation on 11/08/2016 at 7:40 am to Dr. Otelia Limes, who verbally acknowledged these results. Electronically Signed   By: [name] M.D.   On: 11/07/2016 07:49    Assessment: 83 y.o. male with acute right MCA territory mixed ischemia and infarction 1. CT head shows hyperdense distal right M1/ MCA bifurcation with evidence of acute infarction in the right basal ganglia. ASPECTS is at best 8. No acute intracranial hemorrhage. 2. STAT MRI reveals early right basal ganglia infarction and infarction within the cortex along the inferior aspect of the anterior right frontal lobe. Distal M1 occlusion seen on MRA 3. Stroke Risk Factors - CAD, cardiomyopathy, HTN, sleep apnea, MI, HLD and DM2. 4. MRI selected as study of choice to determine indication for endovascular treatment given impaired renal function  Plan: 1. The patient is a candidate for endovascular intervention. Discussed with patient, family and Dr. Corliss Skains. The patient stated that he is willing to undergo risks of renal failure with contrast administration in order to improve chances of a good neurological outcome. Discussed with family as well, who agree with his decision to proceed with endovascular intervention despite risks of further kidney damage with contrast dye load.  2. After endovascular  procedure, admit to ICU 3. PT consult, OT consult, Speech consult 4. Echocardiogram 5. Carotid dopplers 6. CT head after intervention and at 24 hours following intervention. If negative for hemorrhage, will start either antiplatelet therapy or anticoagulation (see #7 below regarding anticoagulation - ? presence/absence of atrial fiibrillation; will need to discuss with his PCP or GI regarding anticoagulation given recent history of GIB if he has a-fib). 7. EKG and discussion with PCP to determine if in fact he has atrial fibrillation.  8. Risk factor modification 9. Telemetry monitoring 10. HgbA1c, fasting lipid panel 11. BP management.  12. Benefits of statin most likely outweighed by risks given patient's advanced age.   60 minutes of time spent in the Neurological evaluation and management of this critically ill acute stroke patient.    Electronically signed: Dr. Caryl Pina 11/24/2016, 8:08 AM

## 2016-11-23 NOTE — Sedation Documentation (Signed)
Spoke with patient and family regarding urinary history. Informed that patient had problems in the past voiding after foley insertion; per family that history has been resolved d/t prostate. Dr. Drusilla Kanner aware; okay to place foley. Family/patient agree.

## 2016-11-23 NOTE — Progress Notes (Signed)
Notified CCM via eLink r/t pt's decreasing BP's. As low as 92/50 (63). Pt's BP is to be maintained between 120-140 systolic and therefore MD placed orders to assist w/ increasing BP while decreasing con't sedation - by giving IVP medications.  Will continue to monitor closely - pt's radial A-line w/ major whip (maintaining a whip since admission to unit) therefore; basing BP on cuff pressure as a-line might be incorrect.   Francia Greaves, RN

## 2016-11-23 NOTE — Anesthesia Postprocedure Evaluation (Signed)
Anesthesia Post Note  Patient: [name]  Procedure(s) Performed: Procedure(s) (LRB): RADIOLOGY WITH ANESTHESIA (N/A)  Patient location during evaluation: ICU Anesthesia Type: General Level of consciousness: sedated and patient remains intubated per anesthesia plan Pain management: pain level controlled Vital Signs Assessment: post-procedure vital signs reviewed and stable Respiratory status: spontaneous breathing, nonlabored ventilation, respiratory function stable and patient on ventilator - see flowsheet for VS Cardiovascular status: blood pressure returned to baseline and stable Anesthetic complications: no       Last Vitals:  Vitals:   11/02/2016 1118 10/30/2016 1245  BP:  (!) 135/57  Pulse:  (!) 43  Resp:  18  Temp: 36.4 C     Last Pain:  Vitals:   11/05/2016 0833  TempSrc:   PainSc: 0-No pain                 Phillips Grout

## 2016-11-23 NOTE — Procedures (Signed)
S/P RT common carotid arterigram. RT CFA appeoacch. Findings. 1S/P complete revascularization of occluded RT MCA M1 seg following x 3 passes with 52mmx40 mm Solitaire retreval device achieving a TICI score with immediate occlusion secondary  to underlying severe stenosis needing rescue stenting with A TICI 3 reperfusion.

## 2016-11-23 NOTE — ED Notes (Signed)
Dr Corliss Skains at bedside.

## 2016-11-23 NOTE — Sedation Documentation (Signed)
Bedside report give to 4N ICU RN. Pulses and groin checked with RN.

## 2016-11-23 NOTE — Progress Notes (Signed)
PT Cancellation Note  Patient Details Name: [name] MRN: [id number] DOB: 05/23/1928   Cancelled Treatment:    Reason Eval/Treat Not Completed: Patient not medically ready;Medical issues which prohibited therapy Pt on bedrest. Will await increase in activity orders prior to PT evaluation.   [name] [name] 11/02/2016, 1:27 PM [name], PT, DPT [phone]

## 2016-11-23 NOTE — ED Provider Notes (Signed)
[facility]ENCY DEPT Provider Note   CSN: [id number] Arrival date & time: 11/02/2016  9485     History   Chief Complaint No chief complaint on file.   HPI [name] is a 83 y.o. male.  Patient is an 83 year old male with extensive past medical history including coronary artery disease/MI, cardiomyopathy, and diabetes. He was brought by EMS for evaluation of weakness. He went to bed last night and 11:30 in his normal state of health, however woke up this morning with a left-sided facial droop, left arm, and left leg weakness. He denies any recent fall, injury, or trauma.   The history is provided by the patient.  Weakness  Primary symptoms include focal weakness. This is a new problem. Episode onset: Last night. The problem has not changed since onset.There was left facial, left upper extremity and left lower extremity focality noted. There has been no fever. Pertinent negatives include no shortness of breath, no chest pain, no altered mental status and no headaches. Associated medical issues do not include trauma.    Past Medical History:  Diagnosis Date  . Anemia   . BPH (benign prostatic hypertrophy)   . Cardiomyopathy (HCC)   . Coronary artery disease   . Diverticulosis   . Essential hypertension, benign   . GI bleed 2015  . History of kidney stones   . Hypersomnia with sleep apnea, unspecified   . MI (myocardial infarction) (HCC) 1996  . Occasional tremors   . Other and unspecified hyperlipidemia   . Pure hypercholesterolemia   . Sleep apnea    unable to use C-PAP  . Type II or unspecified type diabetes mellitus without mention of complication, uncontrolled     Patient Active Problem List   Diagnosis Date Noted  . Elevated troponin   . Severe sepsis (HCC) 04/12/2016  . Lactic acidosis 04/12/2016  . Acute renal failure (HCC) 04/12/2016  . Ureteral stone with hydronephrosis 04/12/2016  . Leukocytosis 04/12/2016  . Acute pyelonephritis 04/12/2016  .  Atelectasis of left lung 04/12/2016  . Nephrolithiasis 12/02/2015  . Chronic progressive renal failure, stage 3 (moderate) 12/02/2015  . Urinary retention 12/02/2015  . Chronic gouty arthritis 09/23/2015  . Morbid obesity (HCC) 04/15/2015  . GI bleed 02/21/2015  . Peripheral vascular disease (HCC) 10/24/2014  . Frank hematuria 09/30/2014  . Bradycardia 06/17/2014  . Paroxysmal atrial fibrillation (HCC) 06/07/2014  . Arteriosclerosis of coronary artery 01/26/2014  . Diabetes mellitus (HCC) 01/26/2014  . Essential hypertension 12/19/2013  . Cardiomyopathy (HCC) 11/21/2013  . Hypercholesterolemia 11/21/2013  . Obstructive apnea 11/21/2013  . Coronary artery disease 07/27/2013  . Status post coronary angioplasty 07/27/2013  . Diabetes mellitus type 2 with complications (HCC) 07/27/2013  . Atrial fibrillation (HCC) 07/27/2013  . Obesity 07/27/2013  . Hyperlipidemia 07/27/2013  . Chronic diastolic CHF (congestive heart failure) (HCC) 07/27/2013  . Lower GI bleed 07/27/2013  . Elevated prostate specific antigen (PSA) 08/07/2012  . Benign prostatic hyperplasia with urinary obstruction 08/07/2012  . ED (erectile dysfunction) of organic origin 08/07/2012    Past Surgical History:  Procedure Laterality Date  . CARDIAC CATHETERIZATION    . CHOLECYSTECTOMY  1994  . CORONARY ANGIOPLASTY  1996   [place]  . CYSTOSCOPY W/ URETERAL STENT PLACEMENT Left 05/31/2016   Procedure: CYSTOSCOPY WITH STENT REPLACEMENT;  Surgeon: [name], MD;  Location: [facility] ORS;  Service: Urology;  Laterality: Left;  . CYSTOSCOPY WITH LITHOLAPAXY N/A 05/11/2016   Procedure: CYSTOSCOPY WITH LITHOLAPAXY;  Surgeon: [name], MD;  Location:  [facility] ORS;  Service: Urology;  Laterality: N/A;  . CYSTOSCOPY/URETEROSCOPY/HOLMIUM LASER/STENT PLACEMENT Left 05/11/2016   Procedure: CYSTOSCOPY/URETEROSCOPY/HOLMIUM LASER/STENT PLACEMENT;  Surgeon: [name], MD;  Location: [facility] ORS;  Service: Urology;  Laterality:  Left;  . ESOPHAGOGASTRODUODENOSCOPY (EGD) WITH PROPOFOL Left 11/23/2015   Procedure: ESOPHAGOGASTRODUODENOSCOPY (EGD) WITH PROPOFOL;  Surgeon: [name], MD;  Location[facility] ENDOSCOPY;  Service: Endoscopy;  Laterality: Left;  . EYE SURGERY Bilateral    Cataract Extraction with IOL   . FLEXIBLE SIGMOIDOSCOPY    . HERNIA REPAIR Left    Inguinal Hernia Repair  . inguinal hernia repair     left inguinal   . INSERTION OF SUPRAPUBIC CATHETER N/A 05/31/2016   Procedure: INSERTION OF SUPRAPUBIC CATHETER;  Surgeon: [name], MD;  Location: [facility] ORS;  Service: Urology;  Laterality: N/A;  . IR GENERIC HISTORICAL  04/12/2016   IR NEPHROSTOMY PLACEMENT LEFT 04/12/2016 [name], MD [facility]-INTERV RAD  . KNEE ARTHROSCOPY Left   . NEPHROSTOMY  05/11/2016   Procedure: NEPHROSTOMY tude removal;  Surgeon: [name], MD;  Location: [facility] ORS;  Service: Urology;;  . TONSILLECTOMY    . URETEROSCOPY  05/31/2016   Procedure: URETEROSCOPY;  Surgeon: [name], MD;  Location: [facility] ORS;  Service: Urology;;       Home Medications    Prior to Admission medications   Medication Sig Start Date End Date Taking? Authorizing Provider  Coenzyme Q10 (CO Q 10) 10 MG CAPS Take 1 tablet by mouth daily.     [provider]  docusate sodium (COLACE) 100 MG capsule Take 1 capsule (100 mg total) by mouth 2 (two) times daily as needed (take to keep stool soft.). 04/28/16   [name], MD  ferrous sulfate 325 (65 FE) MG tablet Take 325 mg by mouth daily with breakfast.    [provider]  finasteride (PROSCAR) 5 MG tablet Take 1 tablet (5 mg total) by mouth daily. 11/09/16   [name], MD  furosemide (LASIX) 40 MG tablet Take 40-80 mg by mouth 2 (two) times daily. Take 2 tablets every am & 1 tablet every pm.     [provider]  glimepiride (AMARYL) 1 MG tablet Take 1 mg by mouth daily with breakfast.  03/02/16 03/02/17  [provider]  HUMALOG MIX 75/25 KWIKPEN  (75-25) 100 UNIT/ML Stephanie Coup  06/30/16   [provider]  losartan (COZAAR) 100 MG tablet Take 100 mg by mouth daily. 03/31/16   [provider]  Magnesium 250 MG TABS Take by mouth daily.    [provider]  metolazone (ZAROXOLYN) 5 MG tablet Take 5 mg by mouth as needed. Reported on 12/15/2015    [provider]  tamsulosin (FLOMAX) 0.4 MG CAPS capsule Take 1 capsule (0.4 mg total) by mouth daily. 07/09/16   [name], MD    Family History Family History  Problem Relation Age of Onset  . Rheum arthritis Mother   . Alzheimer's disease Father     Social History Social History  Substance Use Topics  . Smoking status: Former Smoker    Packs/day: 1.00    Years: 50.00    Types: Cigarettes  . Smokeless tobacco: Never Used  . Alcohol use No     Allergies   Exforge [amlodipine besylate-valsartan]; Lisinopril; and Niacin and related   Review of Systems Review of Systems  Respiratory: Negative for shortness of breath.   Cardiovascular: Negative for chest pain.  Neurological: Positive for focal weakness and weakness. Negative for headaches.  All other systems reviewed and are negative.    Physical Exam Updated Vital Signs BP (!) 207/86 (BP Location: Right Arm)   Pulse (!) 52   Resp 17   Ht 5\' 5"  (1.651 m)   Wt 79.8 kg (175 lb 14.8 oz)   SpO2 96%   BMI 29.28 kg/m   Physical Exam  Constitutional: He is oriented to person, place, and time. He appears well-developed and well-nourished. No distress.  HENT:  Head: Normocephalic and atraumatic.  Mouth/Throat: Oropharynx is clear and moist.  Neck: Normal range of motion. Neck supple.  Cardiovascular: Normal rate and regular rhythm.  Exam reveals no friction rub.   No murmur heard. Pulmonary/Chest: Effort normal and breath sounds normal. No respiratory distress. He has no wheezes. He has no rales.  Abdominal: Soft. Bowel sounds are normal. He exhibits no distension. There is no tenderness.    Musculoskeletal: Normal range of motion. He exhibits no edema.  Neurological: He is alert and oriented to person, place, and time. A cranial nerve deficit is present. He exhibits abnormal muscle tone. Coordination normal.  There is a left-sided facial droop noted. Strength is 5 out of 5 in the right upper and right lower extremity. Strength is 3 out of 5 in the left arm and 4 out of 5 in the left leg.  Skin: Skin is warm and dry. He is not diaphoretic.  Nursing note and vitals reviewed.    ED Treatments / Results  Labs (all labs ordered are listed, but only abnormal results are displayed) Labs Reviewed  I-STAT CHEM 8, ED - Abnormal; Notable for the following:       Result Value   Potassium 3.4 (*)    BUN 33 (*)    Creatinine, Ser 1.90 (*)    Glucose, Bld 165 (*)    Calcium, Ion 1.02 (*)    Hemoglobin 12.2 (*)    HCT 36.0 (*)    All other components within normal limits  PROTIME-INR  APTT  CBC  DIFFERENTIAL  COMPREHENSIVE METABOLIC PANEL  I-STAT TROPOININ, ED  CBG MONITORING, ED    EKG ED ECG REPORT   Date: 12-07-16  Rate: 48  Rhythm: Atrial Fibrillation with slow ventricular response  QRS Axis: normal  Intervals: normal  ST/T Wave abnormalities: normal  Conduction Disutrbances:none  Narrative Interpretation:   Old EKG Reviewed: unchanged  I have personally reviewed the EKG tracing and agree with the computerized printout as noted.   Radiology No results found.  Procedures Procedures (including critical care time)  Medications Ordered in ED Medications - No data to display   Initial Impression / Assessment and Plan / ED Course  I have reviewed the triage vital signs and the nursing notes.  Pertinent labs & imaging results that were available during my care of the patient were reviewed by me and considered in my medical decision making (see chart for details).  Patient presents here with left facial droop and left hemiparesis that he woke with this  morning. He went to bed at approximately 11:30 last night in his normal state of health. His presentation is consistent with an acute stroke. A code stroke and been initiated prior to his arrival. The patient was seen immediately upon EMS arrival, then sent for the appropriate imaging and laboratory studies.  This revealed what appeared to be an acute right MCA stroke. Went for an MRI, followed by interventional radiology. He was evaluated and treated by Dr. Otelia Limes from Neurology as well.  CRITICAL  CARE Performed by: Geoffery Lyons Total critical care time: 30 minutes Critical care time was exclusive of separately billable procedures and treating other patients. Critical care was necessary to treat or prevent imminent or life-threatening deterioration. Critical care was time spent personally by me on the following activities: development of treatment plan with patient and/or surrogate as well as nursing, discussions with consultants, evaluation of patient's response to treatment, examination of patient, obtaining history from patient or surrogate, ordering and performing treatments and interventions, ordering and review of laboratory studies, ordering and review of radiographic studies, pulse oximetry and re-evaluation of patient's condition.   Final Clinical Impressions(s) / ED Diagnoses   Final diagnoses:  Stroke (cerebrum) [facility])    New Prescriptions New Prescriptions   No medications on file     Geoffery Lyons, MD Dec 03, 2016 [phone]

## 2016-11-23 NOTE — Anesthesia Preprocedure Evaluation (Signed)
Anesthesia Evaluation  Patient identified by MRN, date of birth, ID band Patient confused    Reviewed: Allergy & Precautions, NPO status , Patient's Chart, lab work & pertinent test results, Unable to perform ROS - Chart review only  Airway Mallampati: II  TM Distance: >3 FB Neck ROM: Full    Dental no notable dental hx.    Pulmonary sleep apnea , former smoker,    Pulmonary exam normal breath sounds clear to auscultation       Cardiovascular hypertension, + CAD, + Past MI and +CHF  Normal cardiovascular exam Rhythm:Regular Rate:Normal     Neuro/Psych negative neurological ROS  negative psych ROS   GI/Hepatic negative GI ROS, Neg liver ROS,   Endo/Other  diabetes, Type 2, Insulin Dependent, Oral Hypoglycemic Agents  Renal/GU Renal disease  negative genitourinary   Musculoskeletal negative musculoskeletal ROS (+)   Abdominal   Peds negative pediatric ROS (+)  Hematology negative hematology ROS (+)   Anesthesia Other Findings L sided facial droop and tongue deviation  Reproductive/Obstetrics negative OB ROS                             Anesthesia Physical Anesthesia Plan  ASA: III and emergent  Anesthesia Plan: General   Post-op Pain Management:    Induction: Intravenous  Airway Management Planned: Oral ETT  Additional Equipment:   Intra-op Plan:   Post-operative Plan: Extubation in OR  Informed Consent: I have reviewed the patients History and Physical, chart, labs and discussed the procedure including the risks, benefits and alternatives for the proposed anesthesia with the patient or authorized representative who has indicated his/her understanding and acceptance.   History available from chart only and Only emergency history available  Plan Discussed with: CRNA  Anesthesia Plan Comments: (GA. Code stroke)        Anesthesia Quick Evaluation

## 2016-11-23 NOTE — ED Notes (Signed)
Pt from home via  [facility] with c/o left sided facial droop, slurred speech, left arm and leg flaccidity upon waking up this morning at 0600. LSW was 2330 last night. Pt was able to barely wiggle his fingers and toes with EMS but had no effort against gravity on the left side.  On arrival to the ED pt is no longer able to move his fingers or toes on the left side. Pt is A&Ox4.

## 2016-11-23 NOTE — Consult Note (Signed)
PULMONARY / CRITICAL CARE MEDICINE   Name: [name] MRN: [id number] DOB: 09-30-1927    ADMISSION DATE:  11/19/2016 CONSULTATION DATE:  5/29  REFERRING MD:  11/07/2016  CHIEF COMPLAINT:  CVA  HISTORY OF PRESENT ILLNESS:   83 year old male with PMH as below, which is significant for Afib not on anticoagulation (stopped recently due to GI bleed), OSA not on CPAP, Cardiomyopathy, CAD, and DM. 5/28 he presented to [facility] ED with complaints of L sided weakness with L sided facial droop, dysarthria, and left hemineglect. Last seen normal 2330 the prior evening. Upon awakening at 6AM he was noted to have the aforementioned deficits. Code stroke was immediately called in the ED. Intracranial imaging demonstrated acute CVA and the patient was taken to IR for endovascular procedure. Underwent thrombectomy by Dr. Corliss Skains and was sent to ICU for recovery on ventilator.   PAST MEDICAL HISTORY :  He  has a past medical history of Anemia; BPH (benign prostatic hypertrophy); Cardiomyopathy (HCC); Coronary artery disease; Diverticulosis; Essential hypertension, benign; GI bleed (2015); History of kidney stones; Hypersomnia with sleep apnea, unspecified; MI (myocardial infarction) (HCC) (1996); Occasional tremors; Other and unspecified hyperlipidemia; Pure hypercholesterolemia; Sleep apnea; and Type II or unspecified type diabetes mellitus without mention of complication, uncontrolled.  PAST SURGICAL HISTORY: He  has a past surgical history that includes Tonsillectomy; inguinal hernia repair; Cholecystectomy (1994); Flexible sigmoidoscopy; Cardiac catheterization; Coronary angioplasty (1996); Esophagogastroduodenoscopy (egd) with propofol (Left, 11/23/2015); ir generic historical (04/12/2016); Hernia repair (Left); Eye surgery (Bilateral); Knee arthroscopy (Left); Cystoscopy/ureteroscopy/holmium laser/stent placement (Left, 05/11/2016); Cystoscopy with litholapaxy (N/A, 05/11/2016); Nephrostomy  (05/11/2016); Cystoscopy w/ ureteral stent placement (Left, 05/31/2016); Insertion of suprapubic catheter (N/A, 05/31/2016); and Ureteroscopy (05/31/2016).  Allergies  Allergen Reactions  . Exforge [Amlodipine Besylate-Valsartan] Swelling and Other (See Comments)    Pt states that he is unable to swallow.    . Lisinopril Swelling and Other (See Comments)    Pt states that he is unable to swallow.    . Niacin And Related Swelling and Other (See Comments)    Pt states that he is unable to swallow.      No current facility-administered medications on file prior to encounter.    Current Outpatient Prescriptions on File Prior to Encounter  Medication Sig  . Coenzyme Q10 (CO Q 10) 10 MG CAPS Take 1 tablet by mouth daily.   Marland Kitchen docusate sodium (COLACE) 100 MG capsule Take 1 capsule (100 mg total) by mouth 2 (two) times daily as needed (take to keep stool soft.).  Marland Kitchen ferrous sulfate 325 (65 FE) MG tablet Take 325 mg by mouth daily with breakfast.  . finasteride (PROSCAR) 5 MG tablet Take 1 tablet (5 mg total) by mouth daily.  . furosemide (LASIX) 40 MG tablet Take 40-80 mg by mouth 2 (two) times daily. Take 2 tablets every am & 1 tablet every pm.   . glimepiride (AMARYL) 1 MG tablet Take 1 mg by mouth daily with breakfast.   . HUMALOG MIX 75/25 KWIKPEN (75-25) 100 UNIT/ML Kwikpen   . losartan (COZAAR) 100 MG tablet Take 100 mg by mouth daily.  . Magnesium 250 MG TABS Take by mouth daily.  . metolazone (ZAROXOLYN) 5 MG tablet Take 5 mg by mouth as needed. Reported on 12/15/2015  . tamsulosin (FLOMAX) 0.4 MG CAPS capsule Take 1 capsule (0.4 mg total) by mouth daily.    FAMILY HISTORY:  His indicated that his mother is deceased. He indicated that his father is deceased.  SOCIAL HISTORY: He  reports that he has quit smoking. His smoking use included Cigarettes. He has a 50.00 pack-year smoking history. He has never used smokeless tobacco. He reports that he does not drink alcohol or use  drugs.  REVIEW OF SYSTEMS:   unable  SUBJECTIVE:  unable  VITAL SIGNS: BP (!) 203/81 (BP Location: Right Arm)   Pulse (!) 52   Temp 97.5 F (36.4 C)   Resp 17   Ht 5\' 5"  (1.651 m)   Wt 79.8 kg (175 lb 14.8 oz)   SpO2 98%   BMI 29.28 kg/m   HEMODYNAMICS:    VENTILATOR SETTINGS:    INTAKE / OUTPUT: No intake/output data recorded.  PHYSICAL EXAMINATION: General:  Elderly male on vent in NAD Neuro:  Sedated on vent HEENT:  /AT, PERRL, no JVD Cardiovascular:  Brady (30s), irregular. No MRG. Pulses palpable x4 extremities.  Lungs:  Clear bilateral breath sounds Abdomen:  Soft, non-tender, non-distended Musculoskeletal:  No acute deformity or ROM limitation Skin:  Grossly intact  LABS:  BMET  Recent Labs Lab 12/04/2016 0717 12-04-16 0721  NA 138 142  K 3.4* 3.4*  CL 107 106  CO2 22  --   BUN 30* 33*  CREATININE 1.85* 1.90*  GLUCOSE 166* 165*    Electrolytes  Recent Labs Lab 2016-12-04 0717  CALCIUM 8.4*    CBC  Recent Labs Lab 12-04-2016 0717 12/04/2016 0721  WBC 7.4  --   HGB 11.9* 12.2*  HCT 36.9* 36.0*  PLT 173  --     Coag's  Recent Labs Lab 12-04-16 0717  APTT 32  INR 1.10    Sepsis Markers No results for input(s): LATICACIDVEN, PROCALCITON, O2SATVEN in the last 168 hours.  ABG No results for input(s): PHART, PCO2ART, PO2ART in the last 168 hours.  Liver Enzymes  Recent Labs Lab 2016-12-04 0717  AST 42*  ALT 36  ALKPHOS 131*  BILITOT 0.8  ALBUMIN 3.4*    Cardiac Enzymes No results for input(s): TROPONINI, PROBNP in the last 168 hours.  Glucose No results for input(s): GLUCAP in the last 168 hours.  Imaging Ct Head Wo Contrast  Result Date: 12-04-2016 CLINICAL DATA:  Followup right MCA re- cannulization EXAM: CT HEAD WITHOUT CONTRAST TECHNIQUE: Contiguous axial images were obtained from the base of the skull through the vertex without intravenous contrast. COMPARISON:  Earlier same day FINDINGS: Brain: Right MCA  pipeline stent placement. There is contrast staining in the right caudate and putamen in the region affected by acute infarction. No hyperdensity suspected to represent hemorrhage, though petechial bleeding could be hidden within the contrast staining. Low-density/infarction again and of os well affecting the right caudate and putamen with mild subinsular involvement. No new area of infarction is identified. Chronic small-vessel ischemic changes elsewhere affect the cerebral hemispheric white matter. Vascular: Right MCA pipeline stent as previously noted. Vascular calcification at the base of the brain. Vascular contrast present following the previous angiogram. Skull: Normal Sinuses/Orbits: Completely opacified left maxillary sinus. Scattered opacified ethmoid air cells bilaterally. Other: None significant IMPRESSION: Pipeline stent placement in the right MCA. Contrast staining in regions of infarction affecting the right caudate and putamen. Low-density/infarction also evident in the sub insular brain without contrast staining. No evidence of new infarction. No significant mass effect. No demonstrable hemorrhage, though petechial bleeding could be hidden within the contrast staining. Electronically Signed   By: Paulina Fusi M.D.   On: 12/04/16 12:36   [name] Head Wo Contrast  Result  Date: 11/02/2016 CLINICAL DATA:  Code stroke. Left-sided weakness, facial droop, and slurred speech. EXAM: MRI HEAD WITHOUT CONTRAST MRA HEAD WITHOUT CONTRAST TECHNIQUE: Multiplanar, multiecho pulse sequences of the brain and surrounding structures were obtained without intravenous contrast. Angiographic images of the head were obtained using MRA technique without contrast. COMPARISON:  Head CT earlier today FINDINGS: MRI HEAD FINDINGS Only axial diffusion weighted imaging was performed. There is acute infarction throughout the right caudate and lentiform nuclei corresponding to the findings on CT. There is also a small amount of  cortical and subcortical acute infarction in the anteroinferior right frontal lobe. Mild cerebral atrophy and moderate chronic small vessel ischemic disease are again noted. Chronic lacunar infarcts are present in the cerebral white matter bilaterally and left basal ganglia. There is no intracranial mass effect or extra-axial fluid collection. MRA HEAD FINDINGS The visualized distal vertebral arteries are widely patent to the basilar and codominant. Patent right PICA, left AICA, and bilateral SCA origins are visualized. The basilar artery is widely patent. Posterior communicating arteries are diminutive or absent. PCAs are patent without evidence of significant stenosis. The internal carotid arteries are widely patent from skullbase to carotid termini. There is a 2 mm final shaped outpouching projecting inferiorly from the left supraclinoid ICA in the posterior communicating region. A vessel is not clearly resolved arising from this. There is occlusion of the right MCA at the distal M1 level just beyond the anterior temporal artery origin. The left MCA and both ACAs are patent without evidence of significant stenosis. IMPRESSION: 1. Acute infarct involving the right basal ganglia and small portion of the anteroinferior frontal lobe. 2. Distal right M1 MCA occlusion. 3. 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 These results were called by telephone at the time of interpretation on 11/05/2016 at 8:10 am to Dr. Caryl Pina , who verbally acknowledged these results. Electronically Signed   By: [name] M.D.   On: 10/27/2016 08:33   Mr Brain Limited Wo Contrast  Result Date: 11/09/2016 CLINICAL DATA:  Code stroke. Left-sided weakness, facial droop, and slurred speech. EXAM: MRI HEAD WITHOUT CONTRAST MRA HEAD WITHOUT CONTRAST TECHNIQUE: Multiplanar, multiecho pulse sequences of the brain and surrounding structures were obtained without intravenous contrast.  Angiographic images of the head were obtained using MRA technique without contrast. COMPARISON:  Head CT earlier today FINDINGS: MRI HEAD FINDINGS Only axial diffusion weighted imaging was performed. There is acute infarction throughout the right caudate and lentiform nuclei corresponding to the findings on CT. There is also a small amount of cortical and subcortical acute infarction in the anteroinferior right frontal lobe. Mild cerebral atrophy and moderate chronic small vessel ischemic disease are again noted. Chronic lacunar infarcts are present in the cerebral white matter bilaterally and left basal ganglia. There is no intracranial mass effect or extra-axial fluid collection. MRA HEAD FINDINGS The visualized distal vertebral arteries are widely patent to the basilar and codominant. Patent right PICA, left AICA, and bilateral SCA origins are visualized. The basilar artery is widely patent. Posterior communicating arteries are diminutive or absent. PCAs are patent without evidence of significant stenosis. The internal carotid arteries are widely patent from skullbase to carotid termini. There is a 2 mm final shaped outpouching projecting inferiorly from the left supraclinoid ICA in the posterior communicating region. A vessel is not clearly resolved arising from this. There is occlusion of the right MCA at the distal M1 level just beyond the anterior temporal artery origin.  The left MCA and both ACAs are patent without evidence of significant stenosis. IMPRESSION: 1. Acute infarct involving the right basal ganglia and small portion of the anteroinferior frontal lobe. 2. Distal right M1 MCA occlusion. 3. 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 These results were called by telephone at the time of interpretation on 2016-12-12 at 8:10 am to Dr. Caryl Pina , who verbally acknowledged these results. Electronically Signed   By: [name] M.D.   On: Dec 12, 2016 08:33    Ct Head Code Stroke W/o Cm  Result Date: 12-12-2016 CLINICAL DATA:  Code stroke. Left-sided weakness, facial droop, and slurred speech. EXAM: CT HEAD WITHOUT CONTRAST TECHNIQUE: Contiguous axial images were obtained from the base of the skull through the vertex without intravenous contrast. COMPARISON:  None. FINDINGS: Brain: There is mild motion artifact towards the vertex. High right frontal hypoattenuation on image 22 is favored to reflect motion artifact over early infarct based on reformats. There is however abnormal hypoattenuation in the right caudate and lentiform nuclei. No intracranial hemorrhage, mass, midline shift, or extra-axial fluid collection is identified. Mild cerebral atrophy is within normal limits for age. Subcortical and deep cerebral white matter hypodensities are nonspecific but compatible with moderate chronic small vessel ischemic disease. Lacunar infarcts are present in the deep cerebral white matter bilaterally and left basal ganglia, likely chronic. Vascular: Abnormal hyperdensity of the right MCA at the distal M1/bifurcation region. Calcified atherosclerosis at the skullbase. Skull: No fracture or focal osseous lesion. Sinuses/Orbits: Chronic left maxillary sinusitis with sinus atelectasis and near complete opacification. Clear mastoid air cells. Bilateral cataract extraction. Other: None. ASPECTS [facility] Stroke Program Early CT Score) - Ganglionic level infarction (caudate, lentiform nuclei, internal capsule, insula, M1-M3 cortex): 5 - Supraganglionic infarction (M4-M6 cortex): 3 Total score (0-10 with 10 being normal): 8 IMPRESSION: 1. Hyperdense distal right M1/ MCA bifurcation with evidence of acute infarction in the right basal ganglia. 2. ASPECTS is at best 8. 3. No acute intracranial hemorrhage. These results were called by telephone at the time of interpretation on 12/12/16 at 7:40 am to Dr. Otelia Limes, who verbally acknowledged these results. Electronically Signed   By:  [name] M.D.   On: 12-12-2016 07:49     STUDIES:  5/29 > CT head shows hyperdense distal right M1/ MCA bifurcation with evidence of acute infarction in the right basal ganglia. ASPECTS is at best 8. No acute intracranial hemorrhage. 5/29 > STAT MRI reveals early right basal ganglia infarction and infarction within the cortex along the inferior aspect of the anterior right frontal lobe. Distal M1 occlusion seen on MRA  CULTURES: none  ANTIBIOTICS: periop ancef 5/29  SIGNIFICANT EVENTS: 5/29 admit CVA, neuro IR, out to ICU on vent  LINES/TUBES: ETT 5/29 > Art line 5/29 >  DISCUSSION: 83 year old male with cardiac history presented with CVA 5/29. He was not a tpa candidate, but was taken to IR for endovascular thrombectomy. He was then sent to ICU for recovery on vent.   ASSESSMENT / PLAN:  NEUROLOGIC A:   R MCA CVA s/p IR revascularization  P:   RASS goal: -1 to -2 Neurology following Echo, carotid dopplers Further neuro imaging per Stroke service. Anticoagulation per neurology  PULMONARY A: Airway compromise due to need for medical sedation.   P:   Full vent support CXR for ETT placement ABG VAP bundle   CARDIOVASCULAR A:  Hypertension Asymptomatic bradycardia (history of this as well) History of  Atrial fibrillation (not currently on anticoagulation due to recent GI bleeding.)  P:  Telemetry monitoring in ICU setting PRN hydralazine (allergy to CCB and bradycardia so cannot use drips) EKG reviewed May need inpatient cardiology evaluation for atrial fibrillation SBP goal 120/140 mmHg per IR  RENAL A:   CKD Hypokalemia  P:   Gentle hydration Follow BMP Supp K  GASTROINTESTINAL A:   Recent GIB  P:   Protonix NPO  HEMATOLOGIC A:   Anemia (Baseline HGB 11)  P:  Follow CBC SCDs   INFECTIOUS A:   No acute issues  P:   Periop ancef Monitor  ENDOCRINE A:   DM  P:   CBG monitoring and SSI q4 hours    FAMILY  -  Updates: No family present  - Inter-disciplinary family meet or Palliative Care meeting due by:  6/4    Joneen Roach, AGACNP-BC [place] Pulmonology/Critical Care Pager [phone] or [phone]  December 01, 2016 1:40 PM

## 2016-11-23 NOTE — Transfer of Care (Signed)
Immediate Anesthesia Transfer of Care Note  Patient: [name]  Procedure(s) Performed: Procedure(s): RADIOLOGY WITH ANESTHESIA (N/A)  Patient Location: PACU  Anesthesia Type:General  Level of Consciousness: Patient remains intubated per anesthesia plan  Airway & Oxygen Therapy: Patient remains intubated per anesthesia plan and Patient placed on Ventilator (see vital sign flow sheet for setting)  Post-op Assessment: Report given to RN and Post -op Vital signs reviewed and stable  Post vital signs: Reviewed and stable  Last Vitals:  Vitals:   10/29/2016 0830 11/24/2016 1118  BP: (!) 203/81   Pulse:    Resp:    Temp:  36.4 C    Last Pain:  Vitals:   11/13/2016 0833  TempSrc:   PainSc: 0-No pain         Complications: No apparent anesthesia complications

## 2016-11-23 NOTE — Progress Notes (Signed)
Patient ID: Kalyan Scavetta, male   DOB: 05-06-1928, 83 y.o.   MRN: [id number] 83 year old RT H malr LSW 11 30 pm.Found  At 600am with severe RT sided weakness ,dysarthria and left neglect.NIHSS 15  mRSs of 0 prior to ictus. CT brain No ICH. Aspects 8 MRI brain revealed  diffusion restriction in the caudate body and lentiform nucleus,and inferior  frontal lobe. Clinical mismatch between NIHSS and DWI. Marland KitchenMRA occluded RT MCA M1 distally. Patient deemed. appropriate for endovascular revascularization to prevent further neurological injury. The procedure,reasons ,risks of ICH of 10 to 15 % ,worsening neurological deficit ,vent dependency,death and inability to revascularize all  reviewed  with son,wife and daughter. The possibility of rescue stenting intracranially or extracranially discussed, with the need to use antiplatelets which could increase the risk of ICH . Questions were answered fully. Son,wife and daughter agree to proceed.Informed witnessed consent obtained for endovascular treatment. S.Waldemar Siegel MD .

## 2016-11-23 NOTE — Progress Notes (Signed)
Patient s/p cerebral intervention this AM.  Patient remains intubated and sedated this afternoon.  Groin site intact. Patient to remain flat until 6PM.  Continue current management.  IR to follow.   Loyce Dys, MMS RDN PA-C 4:28 PM

## 2016-11-23 NOTE — Progress Notes (Signed)
Code stroke called at 0650, Patient arrived to [facility] ED via [facility] at [phone].  As per EMS and patient, LSN 2330 on 11/22/16, woke this am at 0600 with left side flaccid, facial droop, and slurred speech.  NIHSS 15.  Patient to MRI at 0735.  Patient brought to IR suite at 0747.  MD to bedside at 0825.  Family at bedside.  Patient brought back to IR SUITE at 0845.  Report given to Ir rn

## 2016-11-23 NOTE — Progress Notes (Signed)
While rounding I was approached by patient family who needed assistance with priest and locating whereabouts of [facility]. I escorted them to admission desk and arranged for priest visit.  Per family pt. srgery went well . Pt is still sudated and they will know more tomorrow on how pt. Is doing.   12-23-2016 1508  Clinical Encounter Type  Visited With Family;Health care provider  Visit Type Initial;Follow-up;Spiritual support  Referral From (Self)  Consult/Referral To (self)  Spiritual Encounters  Spiritual Needs Prayer;Emotional  Stress Factors  Family Stress Factors None identified  Venida Jarvis, Chaplain,pager [phone]

## 2016-11-23 NOTE — Anesthesia Procedure Notes (Addendum)
Procedure Name: Intubation Date/Time: 11/07/2016 8:46 AM Performed by: Faustino Congress Dyanna Seiter Pre-anesthesia Checklist: Patient identified, Emergency Drugs available, Suction available, Patient being monitored and Timeout performed Patient Re-evaluated:Patient Re-evaluated prior to inductionOxygen Delivery Method: Circle system utilized Preoxygenation: Pre-oxygenation with 100% oxygen Intubation Type: IV induction and Rapid sequence Laryngoscope Size: McGraph and 4 Grade View: Grade I Tube type: Subglottic suction tube Tube size: 7.5 mm Number of attempts: 1 Airway Equipment and Method: Stylet Placement Confirmation: ETT inserted through vocal cords under direct vision,  positive ETCO2 and breath sounds checked- equal and bilateral Secured at: 22 cm Tube secured with: Tape Comments: Intubation by Larina Bras, SRNA

## 2016-11-24 ENCOUNTER — Inpatient Hospital Stay (HOSPITAL_COMMUNITY): Payer: Medicare Other

## 2016-11-24 ENCOUNTER — Encounter (HOSPITAL_COMMUNITY): Payer: Self-pay | Admitting: Interventional Radiology

## 2016-11-24 ENCOUNTER — Other Ambulatory Visit (HOSPITAL_COMMUNITY): Payer: Medicare Other

## 2016-11-24 DIAGNOSIS — N183 Chronic kidney disease, stage 3 (moderate): Secondary | ICD-10-CM

## 2016-11-24 DIAGNOSIS — J9600 Acute respiratory failure, unspecified whether with hypoxia or hypercapnia: Secondary | ICD-10-CM

## 2016-11-24 DIAGNOSIS — I639 Cerebral infarction, unspecified: Secondary | ICD-10-CM

## 2016-11-24 DIAGNOSIS — N179 Acute kidney failure, unspecified: Secondary | ICD-10-CM

## 2016-11-24 LAB — CBC WITH DIFFERENTIAL/PLATELET
BASOS ABS: 0 10*3/uL (ref 0.0–0.1)
Basophils Relative: 0 %
Eosinophils Absolute: 0.1 10*3/uL (ref 0.0–0.7)
Eosinophils Relative: 2 %
HCT: 30.8 % — ABNORMAL LOW (ref 39.0–52.0)
Hemoglobin: 9.9 g/dL — ABNORMAL LOW (ref 13.0–17.0)
LYMPHS PCT: 13 %
Lymphs Abs: 1.1 10*3/uL (ref 0.7–4.0)
MCH: 29.1 pg (ref 26.0–34.0)
MCHC: 32.1 g/dL (ref 30.0–36.0)
MCV: 90.6 fL (ref 78.0–100.0)
MONO ABS: 0.5 10*3/uL (ref 0.1–1.0)
Monocytes Relative: 6 %
NEUTROS ABS: 6.5 10*3/uL (ref 1.7–7.7)
Neutrophils Relative %: 79 %
Platelets: 154 10*3/uL (ref 150–400)
RBC: 3.4 MIL/uL — AB (ref 4.22–5.81)
RDW: 15.6 % — ABNORMAL HIGH (ref 11.5–15.5)
WBC: 8.2 10*3/uL (ref 4.0–10.5)

## 2016-11-24 LAB — BASIC METABOLIC PANEL
ANION GAP: 7 (ref 5–15)
BUN: 28 mg/dL — ABNORMAL HIGH (ref 6–20)
CHLORIDE: 110 mmol/L (ref 101–111)
CO2: 21 mmol/L — AB (ref 22–32)
Calcium: 7.7 mg/dL — ABNORMAL LOW (ref 8.9–10.3)
Creatinine, Ser: 2.03 mg/dL — ABNORMAL HIGH (ref 0.61–1.24)
GFR calc Af Amer: 32 mL/min — ABNORMAL LOW (ref >60)
GFR calc non Af Amer: 28 mL/min — ABNORMAL LOW (ref >60)
GLUCOSE: 131 mg/dL — AB (ref 65–99)
POTASSIUM: 4.1 mmol/L (ref 3.5–5.1)
Sodium: 138 mmol/L (ref 135–145)

## 2016-11-24 LAB — GLUCOSE, CAPILLARY
GLUCOSE-CAPILLARY: 154 mg/dL — AB (ref 65–99)
GLUCOSE-CAPILLARY: 120 mg/dL — AB (ref 65–99)
GLUCOSE-CAPILLARY: 113 mg/dL — AB (ref 65–99)
Glucose-Capillary: 116 mg/dL — ABNORMAL HIGH (ref 65–99)
Glucose-Capillary: 157 mg/dL — ABNORMAL HIGH (ref 65–99)
Glucose-Capillary: 143 mg/dL — ABNORMAL HIGH (ref 65–99)

## 2016-11-24 LAB — APTT: aPTT: 22 seconds — ABNORMAL LOW (ref 24–36)

## 2016-11-24 LAB — LIPID PANEL
CHOLESTEROL: 115 mg/dL (ref 0–200)
HDL: 21 mg/dL — ABNORMAL LOW (ref >40)
LDL Cholesterol: 59 mg/dL (ref 0–99)
Total CHOL/HDL Ratio: 5.5 RATIO
Triglycerides: 177 mg/dL — ABNORMAL HIGH (ref <150)
VLDL: 35 mg/dL (ref 0–40)

## 2016-11-24 LAB — PROTIME-INR
INR: 1.11
Prothrombin Time: 14.4 seconds (ref 11.4–15.2)

## 2016-11-24 LAB — PLATELET INHIBITION P2Y12: PLATELET FUNCTION P2Y12: 139 [PRU] — AB (ref 194–418)

## 2016-11-24 MED ORDER — FENTANYL CITRATE (PF) 100 MCG/2ML IJ SOLN: 25.0000 ug | INTRAMUSCULAR | Status: DC | PRN

## 2016-11-24 MED ORDER — SALINE SPRAY 0.65 % NA SOLN
1.0000 | NASAL | Status: DC | PRN
  Administered 2016-11-25: 1 via NASAL
  Filled 2016-11-24: qty 44

## 2016-11-24 MED ORDER — SODIUM CHLORIDE 0.9 % IR SOLN
3000.0000 mL | Status: DC
  Administered 2016-11-24 – 2016-12-01 (×82): 3000 mL

## 2016-11-24 NOTE — Procedures (Signed)
Extubation Procedure Note  Patient Details:   Name: [name] DOB: 1927-08-24 MRN: [id number]   Airway Documentation:     Evaluation  O2 sats: stable throughout Complications: No apparent complications Patient did tolerate procedure well. Bilateral Breath Sounds: Clear, Diminished   Yes   Pt extubated to 3L [place] per MD order. Pt stable throughout with no complications. Pt attempting to speak but slurred and having difficulties post stroke. Pt with strong productive cough post extubation. VS within normal limits. No distress noted. RT will continue to monitor.   Ermalinda Barrios M 11/24/2016, 10:21 AM

## 2016-11-24 NOTE — Progress Notes (Signed)
eLink Physician-Brief Progress Note Patient Name: [name] DOB: January 26, 1928 MRN: [id number]   Date of Service  11/24/2016  HPI/Events of Note  Contacted by bedside nurse regarding continued hematuria with previous blood clots and now no urine output despite ultrasound presence of urine within the bladder.   eICU Interventions  1. Ordering stat coag's 2. Consulted urology for bedside assessment      Intervention Category Major Interventions: Other:  Lawanda Cousins 11/24/2016, 3:49 PM

## 2016-11-24 NOTE — Progress Notes (Signed)
SLP Cancellation Note  Patient Details Name: [name] MRN: [id number] DOB: 11-Jun-1928   Cancelled treatment:       Reason Eval/Treat Not Completed: Patient not medically ready. He remains intubated this morning although per RN, he may be ready for extubation today. Will f/u post-extubation as able.   [name] 11/24/2016, 10:00 AM  [name], M.A. CCC-SLP [phone]

## 2016-11-24 NOTE — Progress Notes (Signed)
PULMONARY / CRITICAL CARE MEDICINE   Name: [name] MRN: [id number] DOB: February 25, 1928    ADMISSION DATE:  11/24/2016 CONSULTATION DATE:  5/29  REFERRING MD:  11/22/2016  CHIEF COMPLAINT:  CVA  HISTORY OF PRESENT ILLNESS:   83 year old male with PMH as below, which is significant for Afib not on anticoagulation (stopped recently due to GI bleed), OSA not on CPAP, Cardiomyopathy, CAD, and DM. 5/28 he presented to [facility] ED with complaints of L sided weakness with L sided facial droop, dysarthria, and left hemineglect. Last seen normal 2330 the prior evening. Upon awakening at 6AM he was noted to have the aforementioned deficits. Code stroke was immediately called in the ED. Intracranial imaging demonstrated acute CVA and the patient was taken to IR for endovascular procedure. Underwent thrombectomy by Dr. Corliss Skains and was sent to ICU for recovery on ventilator.   SUBJECTIVE / Interval events:  MRI brain performed this am, as below.  Tolerating PSV currently   VITAL SIGNS: BP 134/63   Pulse (!) 43   Temp 97.8 F (36.6 C) (Axillary)   Resp 18   Ht 5\' 5"  (1.651 m)   Wt 79.8 kg (175 lb 14.8 oz)   SpO2 100%   BMI 29.28 kg/m   HEMODYNAMICS:    VENTILATOR SETTINGS: Vent Mode: PRVC FiO2 (%):  [40 %-100 %] 40 % Set Rate:  [12 bmp] 12 bmp Vt Set:  [550 mL] 550 mL PEEP:  [5 cmH20] 5 cmH20 Plateau Pressure:  [13 cmH20-16 cmH20] 15 cmH20  INTAKE / OUTPUT: I/O last 3 completed shifts: In: 3352.8 [I.V.:3152.8; IV Piggyback:200] Out: 1250 [Urine:1150; Blood:100]  PHYSICAL EXAMINATION: General:  Elderly man on mechanical ventilation, no distress Neuro:  Awake, follows commands right upper extremity, strong cough on command, does not move his left upper extremity. Unable to open eyes completely HEENT:  No oral secretions, pupils equal and round, Cardiovascular:  Regular, heart rate 60s, Lungs:  Clear bilaterally, no wheeze, no crackles Abdomen:  Soft, benign, positive bowel  sounds Musculoskeletal:  No deformities Skin:  No rash  LABS:  BMET  Recent Labs Lab 10/28/2016 0717 10/28/2016 0721 11/13/2016 1248 11/24/16 0514  NA 138 142 135 138  K 3.4* 3.4* 4.1 4.1  CL 107 106 107 110  CO2 22  --  21* 21*  BUN 30* 33* 27* 28*  CREATININE 1.85* 1.90* 1.80* 2.03*  GLUCOSE 166* 165* 147* 131*    Electrolytes  Recent Labs Lab 11/12/2016 0717 11/21/2016 1248 11/24/16 0514  CALCIUM 8.4* 7.7* 7.7*  MG  --  1.9  --   PHOS  --  3.6  --     CBC  Recent Labs Lab 11/16/2016 0717 11/02/2016 0721 11/22/2016 1302 11/24/16 0514  WBC 7.4  --  6.0 8.2  HGB 11.9* 12.2* 9.6* 9.9*  HCT 36.9* 36.0* 30.6* 30.8*  PLT 173  --  151 154    Coag's  Recent Labs Lab 11/17/2016 0717  APTT 32  INR 1.10    Sepsis Markers No results for input(s): LATICACIDVEN, PROCALCITON, O2SATVEN in the last 168 hours.  ABG  Recent Labs Lab 11/20/2016 1344  PHART 7.439  PCO2ART 34.2  PO2ART 387*    Liver Enzymes  Recent Labs Lab 10/27/2016 0717  AST 42*  ALT 36  ALKPHOS 131*  BILITOT 0.8  ALBUMIN 3.4*    Cardiac Enzymes No results for input(s): TROPONINI, PROBNP in the last 168 hours.  Glucose  Recent Labs Lab 10/29/2016 1555 11/13/2016 1943 11/15/2016  2343 11/24/16 0323 11/24/16 0823  GLUCAP 155* 117* 120* 154* 116*    Imaging Ct Head Wo Contrast  Result Date: 2016-12-22 CLINICAL DATA:  Followup right MCA re- cannulization EXAM: CT HEAD WITHOUT CONTRAST TECHNIQUE: Contiguous axial images were obtained from the base of the skull through the vertex without intravenous contrast. COMPARISON:  Earlier same day FINDINGS: Brain: Right MCA pipeline stent placement. There is contrast staining in the right caudate and putamen in the region affected by acute infarction. No hyperdensity suspected to represent hemorrhage, though petechial bleeding could be hidden within the contrast staining. Low-density/infarction again and of os well affecting the right caudate and putamen with  mild subinsular involvement. No new area of infarction is identified. Chronic small-vessel ischemic changes elsewhere affect the cerebral hemispheric white matter. Vascular: Right MCA pipeline stent as previously noted. Vascular calcification at the base of the brain. Vascular contrast present following the previous angiogram. Skull: Normal Sinuses/Orbits: Completely opacified left maxillary sinus. Scattered opacified ethmoid air cells bilaterally. Other: None significant IMPRESSION: Pipeline stent placement in the right MCA. Contrast staining in regions of infarction affecting the right caudate and putamen. Low-density/infarction also evident in the sub insular brain without contrast staining. No evidence of new infarction. No significant mass effect. No demonstrable hemorrhage, though petechial bleeding could be hidden within the contrast staining. Electronically Signed   By: Paulina Fusi M.D.   On: 12-22-16 12:36   Mr Brain Wo Contrast  Result Date: 11/24/2016 CLINICAL DATA:  Follow-up RIGHT MCA territory infarcts. Status post RIGHT MCA pipeline stent. EXAM: MRI HEAD WITHOUT CONTRAST TECHNIQUE: Multiplanar, multiecho pulse sequences of the brain and surrounding structures were obtained without intravenous contrast. COMPARISON:  MRI of the head 12/22/16 FINDINGS: BRAIN: Confluent RIGHT frontal and basal ganglia reduced diffusion with low ADC values, further propagation than prior MRI. Patchy reduced diffusion RIGHT parietal lobe with low ADC values. Subcentimeter LEFT cerebellar acute infarct. Small focus susceptibility artifact RIGHT basal ganglia, microhemorrhage RIGHT parietal lobe. Hemosiderin staining LEFT basal ganglia associated with old infarct. No midline shift. Ventricles and sulci are overall normal for patient's age. Patchy to confluent supratentorial white matter T2 hyperintensities. No abnormal extra-axial fluid collections. VASCULAR: Normal major intracranial vascular flow voids present at  skull base. SKULL AND UPPER CERVICAL SPINE: No abnormal sellar expansion. No suspicious calvarial bone marrow signal. Craniocervical junction maintained. SINUSES/ORBITS: Severe LEFT maxillary and moderate remaining paranasal sinus mucosal thickening. RIGHT grand LEFT mastoid effusions. The included ocular globes and orbital contents are non-suspicious. Status post bilateral ocular lens implants. OTHER: Life-support lines in place. IMPRESSION: Acute multifocal RIGHT MCA territory and posterior watershed infarcts, further propagation from prior MRI. Acute subcentimeter LEFT cerebellar infarct. RIGHT basal ganglia petechial hemorrhage without lobar hematoma. Old hemorrhagic LEFT basal ganglia infarct and moderate to severe chronic small vessel ischemic disease. Electronically Signed   By: Awilda Metro M.D.   On: 11/24/2016 05:31   Portable Chest Xray  Result Date: 12/22/16 CLINICAL DATA:  Intubated. EXAM: PORTABLE CHEST 1 VIEW COMPARISON:  04/12/2016. FINDINGS: The patient is currently rotated to the right. Decreased inspiration. No gross change in enlargement of the cardiac silhouette. Interval endotracheal tube in satisfactory position. Nasogastric tube side hole in the mid stomach with the tip not included. Mild increase in prominence of the pulmonary vasculature and interstitial markings. Small amount of patchy density at the right lung base and linear density at the left lung base. Stable lower thoracic spine degenerative changes and mild scoliosis. IMPRESSION: 1. Endotracheal tube  in satisfactory position. 2. Stable cardiomegaly. 3. Decreased inspiration with associated diffuse crowding of the pulmonary vasculature and interstitial markings and minimal bibasilar atelectasis. Electronically Signed   By: Beckie Salts M.D.   On: 12-15-2016 13:26   Dg Abd Portable 1v  Result Date: Dec 15, 2016 CLINICAL DATA:  83 y/o  M; orogastric tube placement. EXAM: PORTABLE ABDOMEN - 1 VIEW COMPARISON:  None.  FINDINGS: Enteric tube tip projects over the gastric body. Contrast accumulation within the renal collecting system. Right upper quadrant cholecystectomy clips. Nonobstructive bowel gas pattern. IMPRESSION: Enteric tube tip projects over the gastric body. Electronically Signed   By: Mitzi Hansen M.D.   On: 2016-12-15 21:45     STUDIES:  5/29 > CT head shows hyperdense distal right M1/ MCA bifurcation with evidence of acute infarction in the right basal ganglia. ASPECTS is at best 8. No acute intracranial hemorrhage. 5/29 > STAT MRI reveals early right basal ganglia infarction and infarction within the cortex along the inferior aspect of the anterior right frontal lobe. Distal M1 occlusion seen on MRA MRI Brain 5/30 >> acute R MCA CVA and watershed infarcts, acute L cerebellar CVA, R BG petechial hemorrhage without hematoma.   CULTURES: none  ANTIBIOTICS: periop ancef 5/29  SIGNIFICANT EVENTS: 5/29 admit CVA, neuro IR, out to ICU on vent  LINES/TUBES: ETT 5/29 > Art line 5/29 >  DISCUSSION: 83 year old male with cardiac history presented with CVA 5/29. He was not a tpa candidate, but was taken to IR for endovascular thrombectomy. He was then sent to ICU for recovery on vent.   ASSESSMENT / PLAN:  NEUROLOGIC A:   R MCA CVA s/p IR revascularization, stent placement L cerebellar CVA R basal ganglial petechial hemorrhage  P:   RASS goal: 0 - TTE pending - anticoagulation on hold given BG hemorrhages, anti-plt meds OK, discussed with Dr Pearlean Brownie - further imaging per IR, Neuro - carotid doppler US  PULMONARY A: Airway compromise due to need for medical sedation, CVA  P:   - assess for SBT 5/30 - goal extubation as soon as MS will allow - VAP prevention orders.    CARDIOVASCULAR A:  Hypertension Asymptomatic bradycardia (history of this as well) History of Atrial fibrillation (not currently on anticoagulation due to recent GI bleeding.)  P:  -  telemetry -Hydralazine when necessary - History of atrial fibrillation, adequately relation deferred at this time - SBP goal 140  RENAL A:   Acute on CKD, ? IV Dye load Hypokalemia  P:   - Follow BMP, S Cr - Replace lites lites as indicated   GASTROINTESTINAL A:   Recent GIB, off anticoagulation  P:   - Protonix as ordered - Nothing by mouth, assess for swallowing once we are able to get extubated  HEMATOLOGIC A:   Anemia (Baseline HGB 11)  P:  - Follow permit and CBC -SCD in place -Antiplatelet medications as ordered   INFECTIOUS A:   No acute issues  P:   - Follow clinically, WBC, fever  ENDOCRINE A:   DM  P:   -CBG monitoring - sliding Scale insulin per protocol    FAMILY  - Updates: multiple family members present, updated at bedside 5/30 by RB  - Inter-disciplinary family meet or Palliative Care meeting due by:  6/4  Independent CC time 35 minutes  Levy Pupa, MD, PhD 11/24/2016, 9:59 AM [facility] [phone] or if no answer [phone]

## 2016-11-24 NOTE — Consult Note (Addendum)
Consult: Gross hematuria, BPH, urinary retention Requested by: Dr. Jamison Neighbor   Impression/Assessment/plan: 1) gross hematuria - this may be a persistent problem with him on anticoagulation. We'll see how he does on CBI but he has a very large prostate and may continue to bleed. Serial H/H and supportive care.   2) BPH - about a 170 g prostate on CT. Continue finasteride  3) Urinary retention - continue foley. Void trial when stable/able.   I should add I called and spoke with his wife to update her on the patient's status. As I left, I was pleased to see he was sleeping comfortably in bed with light pink CBI.    History of Present Illness: This is an 83 year old male patient of Dr. Apolinar Junes with a history BPH and chronic urinary retention status post channel thulium laser vaporization partly to gain access for a left ureteroscopy November and December 2017. Prostate about 170 g on CT. Postoperatively the stent was removed as well as a suprapubic tube and he did well. He was last seen March 2018 with a post void of 119 mL and voiding spontaneously. He is on finasteride.  He was [facility] to [facility] with right MCA infarct status post revascularization with a mechanical thrombectomy and rescue stent placement. He has been on aspirin and Plavix. He had a Foley catheter and this was removed. He was thought to be in retention and a 16 [place] Foley catheter was replaced today but he developed gross hematuria and the catheter clotted off. He had an urge to void and urology was consulted.   He's in bed in no acute distress but complains that he needs to "pee".   Past Medical History:  Diagnosis Date  . Anemia   . BPH (benign prostatic hypertrophy)   . Cardiomyopathy (HCC)   . Coronary artery disease   . Diverticulosis   . Essential hypertension, benign   . GI bleed 2015  . History of kidney stones   . Hypersomnia with sleep apnea, unspecified   . MI (myocardial infarction) (HCC) 1996  .  Occasional tremors   . Other and unspecified hyperlipidemia   . Pure hypercholesterolemia   . Sleep apnea    unable to use C-PAP  . Type II or unspecified type diabetes mellitus without mention of complication, uncontrolled    Past Surgical History:  Procedure Laterality Date  . CARDIAC CATHETERIZATION    . CHOLECYSTECTOMY  1994  . CORONARY ANGIOPLASTY  1996   [place]  . CYSTOSCOPY W/ URETERAL STENT PLACEMENT Left 05/31/2016   Procedure: CYSTOSCOPY WITH STENT REPLACEMENT;  Surgeon: [name], MD;  Location: [facility] ORS;  Service: Urology;  Laterality: Left;  . CYSTOSCOPY WITH LITHOLAPAXY N/A 05/11/2016   Procedure: CYSTOSCOPY WITH LITHOLAPAXY;  Surgeon: [name], MD;  Location: [facility] ORS;  Service: Urology;  Laterality: N/A;  . CYSTOSCOPY/URETEROSCOPY/HOLMIUM LASER/STENT PLACEMENT Left 05/11/2016   Procedure: CYSTOSCOPY/URETEROSCOPY/HOLMIUM LASER/STENT PLACEMENT;  Surgeon: [name], MD;  Location: [facility] ORS;  Service: Urology;  Laterality: Left;  . ESOPHAGOGASTRODUODENOSCOPY (EGD) WITH PROPOFOL Left 11/23/2015   Procedure: ESOPHAGOGASTRODUODENOSCOPY (EGD) WITH PROPOFOL;  Surgeon: [name], MD;  [facility] END[facility]COPY;  Service: Endoscopy;  Laterality: Left;  . EYE SURGERY Bilateral    Cataract Extraction with IOL   . FLEXIBLE SIGMOIDOSCOPY    . HERNIA REPAIR Left    Inguinal Hernia Repair  . inguinal hernia repair     left inguinal   . INSERTION OF SUPRAPUBIC CATHETER N/A 05/31/2016   Procedure: INSERTION OF  SUPRAPUBIC CATHETER;  Surgeon: [name], MD;  Location: [facility] ORS;  Service: Urology;  Laterality: N/A;  . IR GENERIC HISTORICAL  04/12/2016   IR NEPHROSTOMY PLACEMENT LEFT 04/12/2016 [name], MD [facility]-INTERV RAD  . KNEE ARTHROSCOPY Left   . NEPHROSTOMY  05/11/2016   Procedure: NEPHROSTOMY tude removal;  Surgeon: [name], MD;  Location: [facility] ORS;  Service: Urology;;  . RADIOLOGY WITH ANESTHESIA N/A 12-02-2016   Procedure: RADIOLOGY WITH  ANESTHESIA;  Surgeon: Julieanne Cotton, MD;  Location: [facility] OR;  Service: Radiology;  Laterality: N/A;  . TONSILLECTOMY    . URETEROSCOPY  05/31/2016   Procedure: URETEROSCOPY;  Surgeon: [name], MD;  Location: [facility] ORS;  Service: Urology;;    Home Medications:  Prescriptions Prior to Admission  Medication Sig Dispense Refill Last Dose  . Coenzyme Q10 (CO Q 10) 10 MG CAPS Take 1 tablet by mouth daily.    11/22/2016 at Unknown time  . docusate sodium (COLACE) 100 MG capsule Take 1 capsule (100 mg total) by mouth 2 (two) times daily as needed (take to keep stool soft.). 60 capsule 0 PRN  . ferrous sulfate 325 (65 FE) MG tablet Take 325 mg by mouth daily with breakfast.   11/22/2016 at Unknown time  . finasteride (PROSCAR) 5 MG tablet Take 1 tablet (5 mg total) by mouth daily. 90 tablet 3 11/22/2016 at Unknown time  . furosemide (LASIX) 40 MG tablet Take 40-80 mg by mouth 2 (two) times daily. Take 2 tablets every am & 1 tablet every pm.    11/22/2016 at Unknown time  . glimepiride (AMARYL) 1 MG tablet Take 1 mg by mouth daily with breakfast.    11/22/2016 at Unknown time  . HUMALOG MIX 75/25 KWIKPEN (75-25) 100 UNIT/ML Kwikpen Inject 16 Units into the skin daily.   0 11/22/2016 at Unknown time  . HYDROcodone-acetaminophen (NORCO/VICODIN) 5-325 MG tablet Take 1-2 tablets by mouth every 6 (six) hours as needed for moderate pain.   PRN  . losartan (COZAAR) 100 MG tablet Take 100 mg by mouth daily.  0 11/22/2016 at Unknown time  . Magnesium 250 MG TABS Take 250 mg by mouth daily.    11/22/2016 at Unknown time  . metolazone (ZAROXOLYN) 5 MG tablet Take 5 mg by mouth daily as needed (for fluid). Reported on 12/15/2015   PRN  . oxyCODONE (OXY IR/ROXICODONE) 5 MG immediate release tablet Take 5 mg by mouth every 4 (four) hours as needed for severe pain.   PRN  . potassium chloride SA (K-DUR,KLOR-CON) 20 MEQ tablet Take 20 mEq by mouth daily.   11/22/2016 at Unknown time  . tamsulosin (FLOMAX) 0.4 MG CAPS  capsule Take 1 capsule (0.4 mg total) by mouth daily. 90 capsule 3 11/22/2016 at Unknown time   Allergies:  Allergies  Allergen Reactions  . Exforge [Amlodipine Besylate-Valsartan] Swelling and Other (See Comments)    Pt states that he is unable to swallow.    . Lisinopril Swelling and Other (See Comments)    Pt states that he is unable to swallow.    . Niacin And Related Swelling and Other (See Comments)    Pt states that he is unable to swallow.      Family History  Problem Relation Age of Onset  . Rheum arthritis Mother   . Alzheimer's disease Father    Social History:  reports that he has quit smoking. His smoking use included Cigarettes. He has a 50.00 pack-year smoking history. He has never used smokeless  tobacco. He reports that he does not drink alcohol or use drugs.  ROS: A complete review of systems was performed.  All systems are negative except for pertinent findings as noted. Review of Systems  All other systems reviewed and are negative.    Physical Exam:  Vital signs in last 24 hours: Temp:  [96.3 F (35.7 C)-98.7 F (37.1 C)] 98.2 F (36.8 C) (05/30 2000) Pulse Rate:  [36-81] 79 (05/30 1800) Resp:  [12-47] 24 (05/30 1800) BP: (91-174)/(49-88) 135/88 (05/30 1800) SpO2:  [98 %-100 %] 98 % (05/30 1800) Arterial Line BP: (115-216)/(36-63) 216/54 (05/30 1200) FiO2 (%):  [40 %] 40 % (05/30 0719) General: No acute distress HEENT: Normocephalic, atraumatic Neck: No JVD or lymphadenopathy Cardiovascular: Regular rate and rhythm Lungs: Regular rate and effort Abdomen: Soft, nontender, nondistended, no abdominal masses - bladder was distended until I drained it.  Back: No CVA tenderness Extremities: No edema Neurologic: He is drowsy but arouses and follows commands as well as converses. He told me he lived in [place]. Left hemiplegia. GU: 43 French Foley catheter ends in place with bloody urine.  Procedure: I advanced the 44 French Foley and it was mobile and seemed  to be in the normal position. By simply advancing the catheter I drained about 900 mL of bloody urine. I then tried to irrigate the catheter and could not. Therefore this was removed and the patient was prepped and draped and a new 22 [place] three-way hematuria catheter was placed without difficulty. I then irrigated a lot of clot mainly thick red sludge. I irrigated and clear but he continued to have some mild hematuria and that put him on CBI.   Laboratory Data:  Results for orders placed or performed during the hospital encounter of 11/15/2016 (from the past 24 hour(s))  Glucose, capillary     Status: Abnormal   Collection Time: 11/07/2016 11:43 PM  Result Value Ref Range   Glucose-Capillary 120 (H) 65 - 99 mg/dL  Glucose, capillary     Status: Abnormal   Collection Time: 11/24/16  3:23 AM  Result Value Ref Range   Glucose-Capillary 154 (H) 65 - 99 mg/dL  CBC WITH DIFFERENTIAL     Status: Abnormal   Collection Time: 11/24/16  5:14 AM  Result Value Ref Range   WBC 8.2 4.0 - 10.5 K/uL   RBC 3.40 (L) 4.22 - 5.81 MIL/uL   Hemoglobin 9.9 (L) 13.0 - 17.0 g/dL   HCT 88.2 (L) 80.0 - 34.9 %   MCV 90.6 78.0 - 100.0 fL   MCH 29.1 26.0 - 34.0 pg   MCHC 32.1 30.0 - 36.0 g/dL   RDW 17.9 (H) 15.0 - 56.9 %   Platelets 154 150 - 400 K/uL   Neutrophils Relative % 79 %   Neutro Abs 6.5 1.7 - 7.7 K/uL   Lymphocytes Relative 13 %   Lymphs Abs 1.1 0.7 - 4.0 K/uL   Monocytes Relative 6 %   Monocytes Absolute 0.5 0.1 - 1.0 K/uL   Eosinophils Relative 2 %   Eosinophils Absolute 0.1 0.0 - 0.7 K/uL   Basophils Relative 0 %   Basophils Absolute 0.0 0.0 - 0.1 K/uL  Basic metabolic panel     Status: Abnormal   Collection Time: 11/24/16  5:14 AM  Result Value Ref Range   Sodium 138 135 - 145 mmol/L   Potassium 4.1 3.5 - 5.1 mmol/L   Chloride 110 101 - 111 mmol/L   CO2 21 (L) 22 - 32  mmol/L   Glucose, Bld 131 (H) 65 - 99 mg/dL   BUN 28 (H) 6 - 20 mg/dL   Creatinine, Ser 1.61 (H) 0.61 - 1.24 mg/dL    Calcium 7.7 (L) 8.9 - 10.3 mg/dL   GFR calc non Af Amer 28 (L) >60 mL/min   GFR calc Af Amer 32 (L) >60 mL/min   Anion gap 7 5 - 15  Lipid panel     Status: Abnormal   Collection Time: 11/24/16  5:14 AM  Result Value Ref Range   Cholesterol 115 0 - 200 mg/dL   Triglycerides 096 (H) <150 mg/dL   HDL 21 (L) >04 mg/dL   Total CHOL/HDL Ratio 5.5 RATIO   VLDL 35 0 - 40 mg/dL   LDL Cholesterol 59 0 - 99 mg/dL  Glucose, capillary     Status: Abnormal   Collection Time: 11/24/16  8:23 AM  Result Value Ref Range   Glucose-Capillary 116 (H) 65 - 99 mg/dL   Comment 1 Notify RN    Comment 2 Document in Chart   Platelet inhibition p2y12 (not at [facility])     Status: Abnormal   Collection Time: 11/24/16 10:59 AM  Result Value Ref Range   Platelet Function  P2Y12 139 (L) 194 - 418 PRU  Glucose, capillary     Status: Abnormal   Collection Time: 11/24/16 11:22 AM  Result Value Ref Range   Glucose-Capillary 120 (H) 65 - 99 mg/dL   Comment 1 Notify RN    Comment 2 Document in Chart   Glucose, capillary     Status: Abnormal   Collection Time: 11/24/16  3:52 PM  Result Value Ref Range   Glucose-Capillary 157 (H) 65 - 99 mg/dL   Comment 1 Notify RN    Comment 2 Document in Chart   Protime-INR     Status: None   Collection Time: 11/24/16  6:24 PM  Result Value Ref Range   Prothrombin Time 14.4 11.4 - 15.2 seconds   INR 1.11   APTT     Status: Abnormal   Collection Time: 11/24/16  6:24 PM  Result Value Ref Range   aPTT 22 (L) 24 - 36 seconds  Glucose, capillary     Status: Abnormal   Collection Time: 11/24/16  7:23 PM  Result Value Ref Range   Glucose-Capillary 143 (H) 65 - 99 mg/dL   Recent Results (from the past 240 hour(s))  MRSA PCR Screening     Status: None   Collection Time: 10/31/2016  1:44 PM  Result Value Ref Range Status   MRSA by PCR NEGATIVE NEGATIVE Final    Comment:        The GeneXpert MRSA Assay (FDA approved for NASAL specimens only), is one component of a comprehensive  MRSA colonization surveillance program. It is not intended to diagnose MRSA infection nor to guide or monitor treatment for MRSA infections.    Creatinine:  Recent Labs  11/13/2016 0717 11/08/2016 0721 10/28/2016 1248 11/24/16 0514  CREATININE 1.85* 1.90* 1.80* 2.03*      Serrena Linderman 11/24/2016, 9:14 PM

## 2016-11-24 NOTE — Progress Notes (Signed)
PT Cancellation Note  Patient Details Name: [name] MRN: [id number] DOB: 03-Jun-1928   Cancelled Treatment:    Reason Eval/Treat Not Completed: Patient not medically ready Patient just extubated at 10:20.  Will return at later time for PT evaluation.  Vena Austria 11/24/2016, 11:46 AM Durenda Hurt. Renaldo Fiddler, [facility] Pager [phone]

## 2016-11-24 NOTE — Progress Notes (Signed)
Patient ID: [name], male   DOB: [birth year], 83 y.o.   MRN: [id number] Pt s/p right MCA revasc/stent 5/29; extubated, moving rt side but no LUE movement, brief flicker of left foot movement; PERRL; rt groin site ok, no hematoma; bloody urine in Foley with some clotting earlier,?urology to eval;  AF; WBC nl; creat 2.03; hgb 9.9; P2Y12 139; MRI brain today:    Acute multifocal RIGHT MCA territory and posterior watershed infarcts, further propagation from prior MRI. Acute subcentimeter LEFT cerebellar infarct. RIGHT basal ganglia petechial hemorrhage without lobar hematoma.  Old hemorrhagic LEFT basal ganglia infarct and moderate to severe chronic small vessel ischemic disease  Above reviewed by Dr. Corliss Skains; cont current tx as per neurology.   [name] [facility] Radiology

## 2016-11-24 NOTE — Evaluation (Signed)
Speech Language Pathology Evaluation Patient Details Name: [name] MRN: [id number] DOB: 1928/02/23 Today's Date: 11/24/2016 Time: 4801-6553 SLP Time Calculation (min) (ACUTE ONLY): 15 min  Problem List:  Patient Active Problem List   Diagnosis Date Noted  . Cerebral embolism with cerebral infarction December 01, 2016  . Stroke (cerebrum) (HCC) 12/13/2016  . Elevated troponin   . Severe sepsis (HCC) 04/12/2016  . Lactic acidosis 04/12/2016  . Acute renal failure (HCC) 04/12/2016  . Ureteral stone with hydronephrosis 04/12/2016  . Leukocytosis 04/12/2016  . Acute pyelonephritis 04/12/2016  . Atelectasis of left lung 04/12/2016  . Nephrolithiasis 12/02/2015  . Chronic progressive renal failure, stage 3 (moderate) 12/02/2015  . Urinary retention 12/02/2015  . Chronic gouty arthritis 09/23/2015  . Morbid obesity (HCC) 04/15/2015  . GI bleed 02/21/2015  . Peripheral vascular disease (HCC) 10/24/2014  . Frank hematuria 09/30/2014  . Bradycardia 06/17/2014  . Paroxysmal atrial fibrillation (HCC) 06/07/2014  . Arteriosclerosis of coronary artery 01/26/2014  . Diabetes mellitus (HCC) 01/26/2014  . Essential hypertension 12/19/2013  . Cardiomyopathy (HCC) 11/21/2013  . Hypercholesterolemia 11/21/2013  . Obstructive apnea 11/21/2013  . Coronary artery disease 07/27/2013  . Status post coronary angioplasty 07/27/2013  . Diabetes mellitus type 2 with complications (HCC) 07/27/2013  . Atrial fibrillation (HCC) 07/27/2013  . Obesity 07/27/2013  . Hyperlipidemia 07/27/2013  . Chronic diastolic CHF (congestive heart failure) (HCC) 07/27/2013  . Lower GI bleed 07/27/2013  . Elevated prostate specific antigen (PSA) 08/07/2012  . Benign prostatic hyperplasia with urinary obstruction 08/07/2012  . ED (erectile dysfunction) of organic origin 08/07/2012   Past Medical History:  Past Medical History:  Diagnosis Date  . Anemia   . BPH (benign prostatic hypertrophy)   . Cardiomyopathy (HCC)    . Coronary artery disease   . Diverticulosis   . Essential hypertension, benign   . GI bleed 2015  . History of kidney stones   . Hypersomnia with sleep apnea, unspecified   . MI (myocardial infarction) (HCC) 1996  . Occasional tremors   . Other and unspecified hyperlipidemia   . Pure hypercholesterolemia   . Sleep apnea    unable to use C-PAP  . Type II or unspecified type diabetes mellitus without mention of complication, uncontrolled    Past Surgical History:  Past Surgical History:  Procedure Laterality Date  . CARDIAC CATHETERIZATION    . CHOLECYSTECTOMY  1994  . CORONARY ANGIOPLASTY  1996   [place]  . CYSTOSCOPY W/ URETERAL STENT PLACEMENT Left 05/31/2016   Procedure: CYSTOSCOPY WITH STENT REPLACEMENT;  Surgeon: [name], MD;  Location: [facility] ORS;  Service: Urology;  Laterality: Left;  . CYSTOSCOPY WITH LITHOLAPAXY N/A 05/11/2016   Procedure: CYSTOSCOPY WITH LITHOLAPAXY;  Surgeon: [name], MD;  Location: [facility] ORS;  Service: Urology;  Laterality: N/A;  . CYSTOSCOPY/URETEROSCOPY/HOLMIUM LASER/STENT PLACEMENT Left 05/11/2016   Procedure: CYSTOSCOPY/URETEROSCOPY/HOLMIUM LASER/STENT PLACEMENT;  Surgeon: [name], MD;  Location: [facility] ORS;  Service: Urology;  Laterality: Left;  . ESOPHAGOGASTRODUODENOSCOPY (EGD) WITH PROPOFOL Left 11/23/2015   Procedure: ESOPHAGOGASTRODUODENOSCOPY (EGD) WITH PROPOFOL;  Surgeon: [name], MD;  Location: [facility] ENDOSCOPY;  Service: Endoscopy;  Laterality: Left;  . EYE SURGERY Bilateral    Cataract Extraction with IOL   . FLEXIBLE SIGMOIDOSCOPY    . HERNIA REPAIR Left    Inguinal Hernia Repair  . inguinal hernia repair     left inguinal   . INSERTION OF SUPRAPUBIC CATHETER N/A 05/31/2016   Procedure: INSERTION OF SUPRAPUBIC CATHETER;  Surgeon: [name], MD;  Location:  [facility] ORS;  Service: Urology;  Laterality: N/A;  . IR GENERIC HISTORICAL  04/12/2016   IR NEPHROSTOMY PLACEMENT LEFT 04/12/2016 [name], MD  [facility]-INTERV RAD  . KNEE ARTHROSCOPY Left   . NEPHROSTOMY  05/11/2016   Procedure: NEPHROSTOMY tude removal;  Surgeon: [name], MD;  Location: [facility] ORS;  Service: Urology;;  . RADIOLOGY WITH ANESTHESIA N/A 2016-12-21   Procedure: RADIOLOGY WITH ANESTHESIA;  Surgeon: Julieanne Cotton, MD;  Location: [facility] OR;  Service: Radiology;  Laterality: N/A;  . TONSILLECTOMY    . URETEROSCOPY  05/31/2016   Procedure: URETEROSCOPY;  Surgeon: [name], MD;  Location: [facility] ORS;  Service: Urology;;   HPI:  Pt is an 83 year old male with cardiac history who presented with acute onset of left-sided weakness and slurred speech. He was not a tpa candidate, but was taken to IR for endovascular thrombectomy on 5/29 and remained intubated until 5/30. MRI on 5/30 revealed acute multifocal RIGHT MCA territory and posterior watershed infarcts, an acute subcentimeter LEFT cerebellar infarct, and RIGHT basal ganglia petechial hemorrhage without lobar hematoma.   Assessment / Plan / Recommendation Clinical Impression  Pt is lethargic with difficulty maintaining alertness for completion of basic, familiar tasks. Max cues were provided for sustained attention. He is oriented x3 but needed cues for orientation to situation. When alert and attending, he did appear to follow one-step commands well. His speech is dysarthric secondary to L facial weakness. He will benefit from additional SLP f/u with emphasis on differential diagnosis of abilities as his alertness improves.    SLP Assessment  SLP Recommendation/Assessment: Patient needs continued Speech Lanaguage Pathology Services SLP Visit Diagnosis: Cognitive communication deficit (R41.841);Dysarthria and anarthria (R47.1)    Follow Up Recommendations  Inpatient Rehab    Frequency and Duration min 2x/week  2 weeks      SLP Evaluation Cognition  Overall Cognitive Status: Impaired/Different from baseline Arousal/Alertness: Lethargic Orientation Level: Oriented  to person;Oriented to place;Oriented to time;Disoriented to situation Attention: Sustained Sustained Attention: Impaired Sustained Attention Impairment: Functional basic;Verbal basic Awareness: Impaired Awareness Impairment: Emergent impairment Safety/Judgment: Impaired       Comprehension  Auditory Comprehension Overall Auditory Comprehension: Other (comment) (appears WFL when alert)    Expression Expression Primary Mode of Expression: Verbal Verbal Expression Overall Verbal Expression: Other (comment) (difficult to assess given lethargy)   Oral / Motor  Oral Motor/Sensory Function Overall Oral Motor/Sensory Function:  (L facial droop - needs checked when more alert) Motor Speech Overall Motor Speech: Impaired Phonation: Normal Articulation: Impaired Level of Impairment: Word Intelligibility: Intelligibility reduced Word: 75-100% accurate Interfering Components:  (lethargy)   GO                    [name] 11/24/2016, 3:46 PM  [name], M.A. CCC-SLP [phone]

## 2016-11-24 NOTE — Progress Notes (Signed)
STROKE TEAM PROGRESS NOTE   SUBJECTIVE (INTERVAL HISTORY) Patient has remained neurologically stable overnight. He is arousable and follows few commands. He does have persistent left hemiplegia. MRI scan of the brain done earlier today shows a moderate-sized right MCA infarct with small petechial hemorrhagic transformation in the right basal ganglia. This no significant cytotoxic edema or midline shift. Blood pressure has been adequately controlled. Patient's wife, daughter and son-in-law are at the bedside   OBJECTIVE Temp:  [95.1 F (35.1 C)-99.3 F (37.4 C)] 98.7 F (37.1 C) (05/30 0400) Pulse Rate:  [36-98] 43 (05/30 0719) Cardiac Rhythm: Atrial fibrillation (05/30 0400) Resp:  [12-21] 18 (05/30 0719) BP: (91-150)/(49-94) 134/63 (05/30 0719) SpO2:  [99 %-100 %] 100 % (05/30 0719) Arterial Line BP: (112-187)/(36-64) 187/56 (05/30 0700) FiO2 (%):  [40 %-100 %] 40 % (05/30 0719)  CBC:  Recent Labs Lab 26-Nov-2016 1302 11/24/16 0514  WBC 6.0 8.2  NEUTROABS 4.3 6.5  HGB 9.6* 9.9*  HCT 30.6* 30.8*  MCV 89.7 90.6  PLT 151 154    Basic Metabolic Panel:  Recent Labs Lab 11/26/2016 1248 11/24/16 0514  NA 135 138  K 4.1 4.1  CL 107 110  CO2 21* 21*  GLUCOSE 147* 131*  BUN 27* 28*  CREATININE 1.80* 2.03*  CALCIUM 7.7* 7.7*  MG 1.9  --   PHOS 3.6  --     Lipid Panel:    Component Value Date/Time   CHOL 115 11/24/2016 0514   TRIG 177 (H) 11/24/2016 0514   HDL 21 (L) 11/24/2016 0514   CHOLHDL 5.5 11/24/2016 0514   VLDL 35 11/24/2016 0514   LDLCALC 59 11/24/2016 0514   HgbA1c:  Lab Results  Component Value Date   HGBA1C 7.3 (H) 04/12/2016    PHYSICAL EXAM Elderly Caucasian male who is intubated. . Afebrile. Head is nontraumatic. Neck is supple without bruit.    Cardiac exam no murmur or gallop. Lungs are clear to auscultation. Distal pulses are well felt. Neurological Exam :  Patient is drowsy but can be aroused and opens eyes partially. Will follow midline and  simple commands only. Right gaze preference. Unable to look to the left. Does not blink to threat on either side. Left lower facial weakness. Tongue midline. Dense left hemiplegia with only slight withdrawal to painful stimuli in the left lower extremity and none in the left upper extremity. Purposeful antigravity movements on the right side and will try to reach up for the endotracheal tube. ASSESSMENT/PLAN [name] is a 83 y.o. male with history of of AF on eliquis not on AC, HTN, CAD, CM, OSA who woke with L hemiparesis, dysarthria and L neglect. He did not receive IV t-PA due to delay in arrival. CT and MRI showed hyperdense R MCA. Taken to IR where he received TICI3 revascularization following mechanical thrombectomy and rescue stent placement.   Stroke:   R MCA infarct d/t R MCA M1 occlusion s/p revascularization w/ mechanical thrombectomy and rescue stent placement. Infarct likely due to large vessel disease plus atrial fibrillation. Additional posterior watershed infarct d/t hypoperfusion and L cerebellar infarct.  Code Stroke CT hyperdense R MCA with acute R BG infarct. Aspects 8.  Ltd MRI  Acte R BG and small part frontal lobe infarct.   MRA  Distal R M1 occlusion.  Cerebral angio occluded R MCA M1, Solitaire x 3 passes w/ immediate re-occlusion d/t severe stenosis. Rescue stent placed w/ resultant TICI3 reperfusion  Post IR CT Pipeline stent R MCA w/  infarct & contrast staining R caudate and putamen. Infarct only subinsular.   MRI R MCA, posterior watershed and L cerebellar infarcts. R BG petechial hemorrhage. Old L BG infarct. Moderate small vessel disease.   Carotid doppler no significant extracranial stenosis  2D Echo   pending   LDL 59  HgbA1c pending  SCDs for VTE prophylaxis  Diet NPO time specified   No antithrombotic prior to admission, now on aspirin 325 mg daily and clopidogrel 75 mg daily  Therapy recommendations:  Pending  Disposition:  pending    Respiratory Failure  Intubated for neurointervention  CCM following  Atrial Fibrillation  Home anticoagulation:  none   Eliquis stopped 2 months ago d/t GIB              Hypotension Hx Hypertension  BP goal 120-140 post IR  Sedation weaned  Low dose neo adsded  Stable  Diabetes type II  HgbA1c pending, goal < 7.0  Other Stroke Risk Factors  Advanced age  Former Cigarette smoker  UDS / ETOH level not performed   Overweight, Body mass index is 29.28 kg/m.   Coronary artery disease - MI  Obstructive sleep apnea, unable to use CPAP at home  Cardiomyopathy   Other Active Problems  Hx asymptomatic bradycardia  Hypokalemia  CKD  Recent GIB for which eliquis was stopped  Anemia  Hospital day # 1  I have personally examined this patient, reviewed notes, independently viewed imaging studies, participated in medical decision making and plan of care.ROS completed by me personally and pertinent positives fully documented  I have made any additions or clarifications directly to the above note. Agree with note above.  Presented with right MCA occlusion and infarct. Unclear time of onset and underwent mechanical embolectomy but had recurrent right MCA occlusion likely due to underlying intrinsic stenosis hence underwent emergent pipeline stent placement. He remains at risk for neurological worsening, expansion of hemorrhage and recurrent strokes. He does ablation but has previously had GI bleed on eliquis and seemingly not be a good long-term anticoagulation candidate. I had a long discussion at the bedside with the patient's wife, daughter and son-in-law about his prognosis, plan for further evaluation, treatment and answered questions. Plan to extubate later today as tolerated. Continue dual and typical therapy for now due to intracranial stent. May consider anticoagulation after a few weeks if stable given history of atrial fibrillation. This patient is  critically ill and at significant risk of neurological worsening, death and care requires constant monitoring of vital signs, hemodynamics,respiratory and cardiac monitoring, extensive review of multiple databases, frequent neurological assessment, discussion with family, other specialists and medical decision making of high complexity.I have made any additions or clarifications directly to the above note.This critical care time does not reflect procedure time, or teaching time or supervisory time of PA/NP/Med Resident etc but could involve care discussion time.  I spent 40 minutes of neurocritical care time  in the care of  this patient.     Delia Heady, MD Medical Director [facility] Pager: [phone] 11/24/2016 3:56 PM   To contact Stroke Continuity provider, please refer to [email or web address]. After hours, contact General Neurology

## 2016-11-24 NOTE — Progress Notes (Signed)
*  PRELIMINARY RESULTS* Vascular Ultrasound Carotid Duplex (Doppler) has been completed.  Preliminary findings: Bilateral 1-39% ICA stenosis, antegrade vertebral flow.    Chauncey Fischer 11/24/2016, 10:48 AM

## 2016-11-24 NOTE — Progress Notes (Signed)
Pt unable to void and passing several large clots. Bladder is distended and pt is complaining of discomfort. MD notified order to reinsert foley given.

## 2016-11-24 NOTE — Evaluation (Signed)
Clinical/Bedside Swallow Evaluation Patient Details  Name: [name] MRN: [id number] Date of Birth: 09-28-1927  Today's Date: 11/24/2016 Time: SLP Start Time (ACUTE ONLY): 1431 SLP Stop Time (ACUTE ONLY): 1440 SLP Time Calculation (min) (ACUTE ONLY): 9 min  Past Medical History:  Past Medical History:  Diagnosis Date  . Anemia   . BPH (benign prostatic hypertrophy)   . Cardiomyopathy (HCC)   . Coronary artery disease   . Diverticulosis   . Essential hypertension, benign   . GI bleed 2015  . History of kidney stones   . Hypersomnia with sleep apnea, unspecified   . MI (myocardial infarction) (HCC) 1996  . Occasional tremors   . Other and unspecified hyperlipidemia   . Pure hypercholesterolemia   . Sleep apnea    unable to use C-PAP  . Type II or unspecified type diabetes mellitus without mention of complication, uncontrolled    Past Surgical History:  Past Surgical History:  Procedure Laterality Date  . CARDIAC CATHETERIZATION    . CHOLECYSTECTOMY  1994  . CORONARY ANGIOPLASTY  1996   [place]  . CYSTOSCOPY W/ URETERAL STENT PLACEMENT Left 05/31/2016   Procedure: CYSTOSCOPY WITH STENT REPLACEMENT;  Surgeon: [name], MD;  Location: [facility] ORS;  Service: Urology;  Laterality: Left;  . CYSTOSCOPY WITH LITHOLAPAXY N/A 05/11/2016   Procedure: CYSTOSCOPY WITH LITHOLAPAXY;  Surgeon: [name], MD;  Location: [facility];  Service: Urology;  Laterality: N/A;  . CYSTOSCOPY/URETEROSCOPY/HOLMIUM LASER/STENT PLACEMENT Left 05/11/2016   Procedure: CYSTOSCOPY/URETEROSCOPY/HOLMIUM LASER/STENT PLACEMENT;  Surgeon: [name], MD;  Location: [facility]S;  Service: Urology;  Laterality: Left;  . ESOPHAGOGASTRODUODENOSCOPY (EGD) WITH PROPOFOL Left 11/23/2015   Procedure: ESOPHAGOGASTRODUODENOSCOPY (EGD) WITH PROPOFOL;  Surgeon: [name], MD;  Location: [facility] ENDOSCOPY;  Service: Endoscopy;  Laterality: Left;  . EYE SURGERY Bilateral    Cataract Extraction with IOL   .  FLEXIBLE SIGMOIDOSCOPY    . HERNIA REPAIR Left    Inguinal Hernia Repair  . inguinal hernia repair     left inguinal   . INSERTION OF SUPRAPUBIC CATHETER N/A 05/31/2016   Procedure: INSERTION OF SUPRAPUBIC CATHETER;  Surgeon: [name], MD;  Location: [facility] ORS;  Service: Urology;  Laterality: N/A;  . IR GENERIC HISTORICAL  04/12/2016   IR NEPHROSTOMY PLACEMENT LEFT 04/12/2016 [name], MD [facility]-INTERV RAD  . KNEE ARTHROSCOPY Left   . NEPHROSTOMY  05/11/2016   Procedure: NEPHROSTOMY tude removal;  Surgeon: [name], MD;  Location: [facility] ORS;  Service: Urology;;  . RADIOLOGY WITH ANESTHESIA N/A 11/01/2016   Procedure: RADIOLOGY WITH ANESTHESIA;  Surgeon: Julieanne Cotton, MD;  Location[facility] OR;  Service: Radiology;  Laterality: N/A;  . TONSILLECTOMY    . URETEROSCOPY  05/31/2016   Procedure: URETEROSCOPY;  Surgeon: [name], MD;  Location: [facility] ORS;  Service: Urology;;   HPI:  Pt is an 83 year old male with cardiac history who presented with acute onset of left-sided weakness and slurred speech. He was not a tpa candidate, but was taken to IR for endovascular thrombectomy on 5/29 and remained intubated until 5/30. MRI on 5/30 revealed acute multifocal RIGHT MCA territory and posterior watershed infarcts, an acute subcentimeter LEFT cerebellar infarct, and RIGHT basal ganglia petechial hemorrhage without lobar hematoma.   Assessment / Plan / Recommendation Clinical Impression  Pt is not alert enough for safe PO intake. He will awaken briefly to accept boluses but then holds them orally despite Max cues. He initiated a few pharyngeal swallows, which were followed by weak, delayed  throat clearing and coughing. Given recent intubation, lethargy, and acute CVA, recommend to remain NPO for today. Will reassess for PO readiness on next date pending improved alertness. SLP Visit Diagnosis: Dysphagia, unspecified (R13.10)    Aspiration Risk  Moderate aspiration risk    Diet  Recommendation NPO   Medication Administration: Via alternative means    Other  Recommendations Oral Care Recommendations: Oral care QID Other Recommendations: Have oral suction available   Follow up Recommendations Inpatient Rehab      Frequency and Duration min 2x/week  2 weeks       Prognosis Prognosis for Safe Diet Advancement: Good Barriers to Reach Goals: Cognitive deficits      Swallow Study   General Date of Onset: 11/22/2016 HPI: Pt is an 83 year old male with cardiac history who presented with acute onset of left-sided weakness and slurred speech. He was not a tpa candidate, but was taken to IR for endovascular thrombectomy on 5/29 and remained intubated until 5/30. MRI on 5/30 revealed acute multifocal RIGHT MCA territory and posterior watershed infarcts, an acute subcentimeter LEFT cerebellar infarct, and RIGHT basal ganglia petechial hemorrhage without lobar hematoma. Type of Study: Bedside Swallow Evaluation Previous Swallow Assessment: none in chart Diet Prior to this Study: NPO Temperature Spikes Noted: No Respiratory Status: Room air History of Recent Intubation: Yes Length of Intubations (days): 1 days Date extubated: 11/24/16 Behavior/Cognition: Lethargic/Drowsy;Requires cueing Oral Cavity Assessment: Within Functional Limits Oral Care Completed by SLP: Yes Oral Cavity - Dentition: Adequate natural dentition Vision:  (mostly keeps eyes closed) Self-Feeding Abilities: Needs assist Patient Positioning: Upright in bed Baseline Vocal Quality: Normal Volitional Cough: Weak Volitional Swallow: Unable to elicit    Oral/Motor/Sensory Function Overall Oral Motor/Sensory Function:  (L facial droop noted, needs checked when more alert)   Ice Chips Ice chips: Impaired Presentation: Spoon Oral Phase Impairments: Poor awareness of bolus Oral Phase Functional Implications: Oral holding Pharyngeal Phase Impairments: Suspected delayed Swallow;Throat Clearing -  Delayed;Cough - Delayed   Thin Liquid Thin Liquid: Impaired Presentation:  (swab) Oral Phase Impairments: Poor awareness of bolus Oral Phase Functional Implications: Oral holding Pharyngeal  Phase Impairments: Suspected delayed Swallow;Throat Clearing - Delayed;Cough - Delayed    Nectar Thick Nectar Thick Liquid: Not tested   Honey Thick Honey Thick Liquid: Not tested   Puree Puree: Not tested   Solid   GO   Solid: Not tested        [name] 11/24/2016,3:41 PM  [name], M.A. CCC-SLP [phone]

## 2016-11-24 NOTE — Progress Notes (Signed)
OT Cancellation Note  Patient Details Name: [name] MRN: [id number] DOB: 12/23/1927   Cancelled Treatment:    Reason Eval/Treat Not Completed: Medical issues which prohibited therapy;Patient not medically ready. Pt with active bedrest orders. Will await increase in activity orders prior to initiating OT evaluation. Thank you for this referral!  Doristine Section, MS OTR/L  Pager: [phone]   [facility] Cherylene Ferrufino 11/24/2016, 7:41 AM

## 2016-11-25 ENCOUNTER — Encounter (HOSPITAL_COMMUNITY): Payer: Self-pay | Admitting: Interventional Radiology

## 2016-11-25 ENCOUNTER — Inpatient Hospital Stay (HOSPITAL_COMMUNITY): Payer: Medicare Other

## 2016-11-25 DIAGNOSIS — R319 Hematuria, unspecified: Secondary | ICD-10-CM

## 2016-11-25 DIAGNOSIS — I360 Nonrheumatic tricuspid (valve) stenosis: Secondary | ICD-10-CM

## 2016-11-25 DIAGNOSIS — I6300 Cerebral infarction due to thrombosis of unspecified precerebral artery: Secondary | ICD-10-CM

## 2016-11-25 DIAGNOSIS — I6310 Cerebral infarction due to embolism of unspecified precerebral artery: Secondary | ICD-10-CM

## 2016-11-25 LAB — GLUCOSE, CAPILLARY
GLUCOSE-CAPILLARY: 183 mg/dL — AB (ref 65–99)
GLUCOSE-CAPILLARY: 185 mg/dL — AB (ref 65–99)
GLUCOSE-CAPILLARY: 265 mg/dL — AB (ref 65–99)
Glucose-Capillary: 215 mg/dL — ABNORMAL HIGH (ref 65–99)
Glucose-Capillary: 222 mg/dL — ABNORMAL HIGH (ref 65–99)
Glucose-Capillary: 137 mg/dL — ABNORMAL HIGH (ref 65–99)

## 2016-11-25 LAB — CBC
HCT: 28.5 % — ABNORMAL LOW (ref 39.0–52.0)
HEMOGLOBIN: 8.9 g/dL — AB (ref 13.0–17.0)
MCH: 29 pg (ref 26.0–34.0)
MCHC: 31.2 g/dL (ref 30.0–36.0)
MCV: 92.8 fL (ref 78.0–100.0)
Platelets: 164 10*3/uL (ref 150–400)
RBC: 3.07 MIL/uL — ABNORMAL LOW (ref 4.22–5.81)
RDW: 16.3 % — ABNORMAL HIGH (ref 11.5–15.5)
WBC: 12.2 10*3/uL — ABNORMAL HIGH (ref 4.0–10.5)

## 2016-11-25 LAB — VAS US CAROTID
LCCADSYS: 72 cm/s
LCCAPDIAS: 17 cm/s
LCCAPSYS: 91 cm/s
LEFT ECA DIAS: -14 cm/s
LEFT VERTEBRAL DIAS: 9 cm/s
Left CCA dist dias: 21 cm/s
Left ICA dist dias: -19 cm/s
Left ICA dist sys: -61 cm/s
Left ICA prox dias: -18 cm/s
Left ICA prox sys: -61 cm/s
RCCADSYS: 57 cm/s
RCCAPDIAS: 16 cm/s
RCCAPSYS: 96 cm/s
RIGHT ECA DIAS: -13 cm/s
RIGHT VERTEBRAL DIAS: -11 cm/s

## 2016-11-25 LAB — ECHOCARDIOGRAM COMPLETE
Height: 65 in
Weight: 2814.83 oz

## 2016-11-25 LAB — BASIC METABOLIC PANEL
ANION GAP: 13 (ref 5–15)
BUN: 27 mg/dL — ABNORMAL HIGH (ref 6–20)
CALCIUM: 7.5 mg/dL — AB (ref 8.9–10.3)
CHLORIDE: 113 mmol/L — AB (ref 101–111)
CO2: 15 mmol/L — AB (ref 22–32)
Creatinine, Ser: 2.18 mg/dL — ABNORMAL HIGH (ref 0.61–1.24)
GFR calc Af Amer: 30 mL/min — ABNORMAL LOW (ref >60)
GFR calc non Af Amer: 26 mL/min — ABNORMAL LOW (ref >60)
GLUCOSE: 196 mg/dL — AB (ref 65–99)
Potassium: 3.6 mmol/L (ref 3.5–5.1)
Sodium: 141 mmol/L (ref 135–145)

## 2016-11-25 LAB — MAGNESIUM
Magnesium: 2.1 mg/dL (ref 1.7–2.4)
Magnesium: 2.1 mg/dL (ref 1.7–2.4)

## 2016-11-25 LAB — PHOSPHORUS
PHOSPHORUS: 3.6 mg/dL (ref 2.5–4.6)
PHOSPHORUS: 3.4 mg/dL (ref 2.5–4.6)

## 2016-11-25 LAB — HEMOGLOBIN A1C
Hgb A1c MFr Bld: 8.2 % — ABNORMAL HIGH (ref 4.8–5.6)
MEAN PLASMA GLUCOSE: 189 mg/dL

## 2016-11-25 MED ORDER — PRO-STAT SUGAR FREE PO LIQD
30.0000 mL | Freq: Two times a day (BID) | ORAL | Status: DC
  Administered 2016-11-25 – 2016-11-29 (×7): 30 mL
  Filled 2016-11-25 (×7): qty 30

## 2016-11-25 MED ORDER — ASPIRIN 325 MG PO TABS
325.0000 mg | ORAL_TABLET | Freq: Every day | ORAL | Status: DC
  Administered 2016-11-25 – 2016-11-30 (×6): 325 mg
  Filled 2016-11-25 (×7): qty 1

## 2016-11-25 MED ORDER — CLOPIDOGREL BISULFATE 75 MG PO TABS
75.0000 mg | ORAL_TABLET | Freq: Every day | ORAL | Status: DC
  Administered 2016-11-25 – 2016-11-26 (×2): 75 mg
  Filled 2016-11-25 (×3): qty 1

## 2016-11-25 MED ORDER — LOSARTAN POTASSIUM 50 MG PO TABS
100.0000 mg | ORAL_TABLET | Freq: Every day | ORAL | Status: DC
  Administered 2016-11-25 – 2016-11-28 (×4): 100 mg
  Filled 2016-11-25 (×4): qty 2

## 2016-11-25 MED ORDER — CLOPIDOGREL BISULFATE 75 MG PO TABS: 75.0000 mg | ORAL_TABLET | Freq: Every day | ORAL | Status: DC

## 2016-11-25 MED ORDER — SODIUM CHLORIDE 0.9 % IV BOLUS (SEPSIS)
1000.0000 mL | Freq: Once | INTRAVENOUS | Status: AC
  Administered 2016-11-25: 1000 mL via INTRAVENOUS

## 2016-11-25 MED ORDER — FUROSEMIDE 80 MG PO TABS: 80.0000 mg | ORAL_TABLET | Freq: Every day | ORAL | Status: DC

## 2016-11-25 MED ORDER — LOSARTAN POTASSIUM 50 MG PO TABS: 100.0000 mg | ORAL_TABLET | Freq: Every day | ORAL | Status: DC

## 2016-11-25 MED ORDER — FUROSEMIDE 80 MG PO TABS
80.0000 mg | ORAL_TABLET | Freq: Every day | ORAL | Status: DC
  Administered 2016-11-25 – 2016-11-29 (×5): 80 mg
  Filled 2016-11-25 (×7): qty 1

## 2016-11-25 MED ORDER — JEVITY 1.2 CAL PO LIQD
1000.0000 mL | ORAL | Status: DC
  Administered 2016-11-25 – 2016-11-27 (×3): 1000 mL
  Filled 2016-11-25 (×4): qty 1000

## 2016-11-25 NOTE — Progress Notes (Signed)
Patient ID: [name], male   DOB: [birth year], 83 y.o.   MRN: [id number]    Referring Physician(s): Dr. Caryl Pina  Supervising Physician: Julieanne Cotton  Patient Status: [facility] - In-pt  Chief Complaint: CVA  Subjective: Patient extubated and making a lot of respiratory noises.  Unable to move left side still.  Is somewhat nonsensical.   Allergies: Exforge [amlodipine besylate-valsartan]; Lisinopril; and Niacin and related  Medications: Prior to Admission medications   Medication Sig Start Date End Date Taking? Authorizing Provider  Coenzyme Q10 (CO Q 10) 10 MG CAPS Take 1 tablet by mouth daily.    Yes [provider]  docusate sodium (COLACE) 100 MG capsule Take 1 capsule (100 mg total) by mouth 2 (two) times daily as needed (take to keep stool soft.). 04/28/16  Yes [name], MD  ferrous sulfate 325 (65 FE) MG tablet Take 325 mg by mouth daily with breakfast.   Yes [provider]  finasteride (PROSCAR) 5 MG tablet Take 1 tablet (5 mg total) by mouth daily. 11/09/16  Yes [name], MD  furosemide (LASIX) 40 MG tablet Take 40-80 mg by mouth 2 (two) times daily. Take 2 tablets every am & 1 tablet every pm.    Yes [provider]  glimepiride (AMARYL) 1 MG tablet Take 1 mg by mouth daily with breakfast.  03/02/16 03/02/17 Yes [provider]  HUMALOG MIX 75/25 KWIKPEN (75-25) 100 UNIT/ML Kwikpen Inject 16 Units into the skin daily.  06/30/16  Yes [provider]  HYDROcodone-acetaminophen (NORCO/VICODIN) 5-325 MG tablet Take 1-2 tablets by mouth every 6 (six) hours as needed for moderate pain.   Yes [provider]  losartan (COZAAR) 100 MG tablet Take 100 mg by mouth daily. 03/31/16  Yes [provider]  Magnesium 250 MG TABS Take 250 mg by mouth daily.    Yes [provider]  metolazone (ZAROXOLYN) 5 MG tablet Take 5 mg by mouth daily as needed (for fluid). Reported on 12/15/2015   Yes [provider]  oxyCODONE (OXY IR/ROXICODONE) 5 MG immediate release tablet Take 5 mg by mouth every 4 (four) hours as needed for severe pain.   Yes [provider]  potassium chloride SA (K-DUR,KLOR-CON) 20 MEQ tablet Take 20 mEq by mouth daily.   Yes [provider]  tamsulosin (FLOMAX) 0.4 MG CAPS capsule Take 1 capsule (0.4 mg total) by mouth daily. 07/09/16  Yes [name], MD    Vital Signs: BP (!) 107/47   Pulse 77   Temp 98.3 F (36.8 C) (Oral)   Resp (!) 28   Ht 5\' 5"  (1.651 m)   Wt 175 lb 14.8 oz (79.8 kg)   SpO2 99%   BMI 29.28 kg/m   Physical Exam: Heart: irregular Lungs: seems clear, but with a lot of upper airway noise Neuro: moves right side, but unable to do anything with left side  Imaging: Ct Head Wo Contrast  Result Date: 07-Dec-2016 CLINICAL DATA:  Followup right MCA re- cannulization EXAM: CT HEAD WITHOUT CONTRAST TECHNIQUE: Contiguous axial images were obtained from the base of the skull through the vertex without intravenous contrast. COMPARISON:  Earlier same day FINDINGS: Brain: Right MCA pipeline stent placement. There is contrast staining in the right caudate and putamen in the region affected by acute infarction. No hyperdensity suspected to represent hemorrhage, though petechial bleeding could be hidden within the contrast staining. Low-density/infarction again and of os well affecting the right caudate and putamen with mild subinsular  involvement. No new area of infarction is identified. Chronic small-vessel ischemic changes elsewhere affect the cerebral hemispheric white matter. Vascular: Right MCA pipeline stent as previously noted. Vascular calcification at the base of the brain. Vascular contrast present following the previous angiogram. Skull: Normal Sinuses/Orbits: Completely opacified left maxillary sinus. Scattered opacified ethmoid air cells bilaterally. Other: None significant IMPRESSION: Pipeline stent placement in the right MCA.  Contrast staining in regions of infarction affecting the right caudate and putamen. Low-density/infarction also evident in the sub insular brain without contrast staining. No evidence of new infarction. No significant mass effect. No demonstrable hemorrhage, though petechial bleeding could be hidden within the contrast staining. Electronically Signed   By: Paulina Fusi M.D.   On: 11/12/2016 12:36   [name] Head Wo Contrast  Result Date: 10/27/2016 CLINICAL DATA:  Code stroke. Left-sided weakness, facial droop, and slurred speech. EXAM: MRI HEAD WITHOUT CONTRAST MRA HEAD WITHOUT CONTRAST TECHNIQUE: Multiplanar, multiecho pulse sequences of the brain and surrounding structures were obtained without intravenous contrast. Angiographic images of the head were obtained using MRA technique without contrast. COMPARISON:  Head CT earlier today FINDINGS: MRI HEAD FINDINGS Only axial diffusion weighted imaging was performed. There is acute infarction throughout the right caudate and lentiform nuclei corresponding to the findings on CT. There is also a small amount of cortical and subcortical acute infarction in the anteroinferior right frontal lobe. Mild cerebral atrophy and moderate chronic small vessel ischemic disease are again noted. Chronic lacunar infarcts are present in the cerebral white matter bilaterally and left basal ganglia. There is no intracranial mass effect or extra-axial fluid collection. MRA HEAD FINDINGS The visualized distal vertebral arteries are widely patent to the basilar and codominant. Patent right PICA, left AICA, and bilateral SCA origins are visualized. The basilar artery is widely patent. Posterior communicating arteries are diminutive or absent. PCAs are patent without evidence of significant stenosis. The internal carotid arteries are widely patent from skullbase to carotid termini. There is a 2 mm final shaped outpouching projecting inferiorly from the left supraclinoid ICA in the posterior  communicating region. A vessel is not clearly resolved arising from this. There is occlusion of the right MCA at the distal M1 level just beyond the anterior temporal artery origin. The left MCA and both ACAs are patent without evidence of significant stenosis. IMPRESSION: 1. Acute infarct involving the right basal ganglia and small portion of the anteroinferior frontal lobe. 2. Distal right M1 MCA occlusion. 3. 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 These results were called by telephone at the time of interpretation on 10/29/2016 at 8:10 am to Dr. Caryl Pina , who verbally acknowledged these results. Electronically Signed   By: [name] M.D.   On: 11/19/2016 08:33   Mr Brain Wo Contrast  Result Date: 11/24/2016 CLINICAL DATA:  Follow-up RIGHT MCA territory infarcts. Status post RIGHT MCA pipeline stent. EXAM: MRI HEAD WITHOUT CONTRAST TECHNIQUE: Multiplanar, multiecho pulse sequences of the brain and surrounding structures were obtained without intravenous contrast. COMPARISON:  MRI of the head Nov 23, 2016 FINDINGS: BRAIN: Confluent RIGHT frontal and basal ganglia reduced diffusion with low ADC values, further propagation than prior MRI. Patchy reduced diffusion RIGHT parietal lobe with low ADC values. Subcentimeter LEFT cerebellar acute infarct. Small focus susceptibility artifact RIGHT basal ganglia, microhemorrhage RIGHT parietal lobe. Hemosiderin staining LEFT basal ganglia associated with old infarct. No midline shift. Ventricles and sulci are overall normal for patient's age. Patchy to confluent  supratentorial white matter T2 hyperintensities. No abnormal extra-axial fluid collections. VASCULAR: Normal major intracranial vascular flow voids present at skull base. SKULL AND UPPER CERVICAL SPINE: No abnormal sellar expansion. No suspicious calvarial bone marrow signal. Craniocervical junction maintained. SINUSES/ORBITS: Severe LEFT maxillary and  moderate remaining paranasal sinus mucosal thickening. RIGHT grand LEFT mastoid effusions. The included ocular globes and orbital contents are non-suspicious. Status post bilateral ocular lens implants. OTHER: Life-support lines in place. IMPRESSION: Acute multifocal RIGHT MCA territory and posterior watershed infarcts, further propagation from prior MRI. Acute subcentimeter LEFT cerebellar infarct. RIGHT basal ganglia petechial hemorrhage without lobar hematoma. Old hemorrhagic LEFT basal ganglia infarct and moderate to severe chronic small vessel ischemic disease. Electronically Signed   By: Awilda Metro M.D.   On: 11/24/2016 05:31   Portable Chest Xray  Result Date: November 28, 2016 CLINICAL DATA:  Intubated. EXAM: PORTABLE CHEST 1 VIEW COMPARISON:  04/12/2016. FINDINGS: The patient is currently rotated to the right. Decreased inspiration. No gross change in enlargement of the cardiac silhouette. Interval endotracheal tube in satisfactory position. Nasogastric tube side hole in the mid stomach with the tip not included. Mild increase in prominence of the pulmonary vasculature and interstitial markings. Small amount of patchy density at the right lung base and linear density at the left lung base. Stable lower thoracic spine degenerative changes and mild scoliosis. IMPRESSION: 1. Endotracheal tube in satisfactory position. 2. Stable cardiomegaly. 3. Decreased inspiration with associated diffuse crowding of the pulmonary vasculature and interstitial markings and minimal bibasilar atelectasis. Electronically Signed   By: Beckie Salts M.D.   On: 28-Nov-2016 13:26   Dg Abd Portable 1v  Result Date: 11/28/2016 CLINICAL DATA:  83 y/o  M; orogastric tube placement. EXAM: PORTABLE ABDOMEN - 1 VIEW COMPARISON:  None. FINDINGS: Enteric tube tip projects over the gastric body. Contrast accumulation within the renal collecting system. Right upper quadrant cholecystectomy clips. Nonobstructive bowel gas pattern.  IMPRESSION: Enteric tube tip projects over the gastric body. Electronically Signed   By: Mitzi Hansen M.D.   On: 2016/11/28 21:45   Mr Brain Limited Wo Contrast  Result Date: 11-28-2016 CLINICAL DATA:  Code stroke. Left-sided weakness, facial droop, and slurred speech. EXAM: MRI HEAD WITHOUT CONTRAST MRA HEAD WITHOUT CONTRAST TECHNIQUE: Multiplanar, multiecho pulse sequences of the brain and surrounding structures were obtained without intravenous contrast. Angiographic images of the head were obtained using MRA technique without contrast. COMPARISON:  Head CT earlier today FINDINGS: MRI HEAD FINDINGS Only axial diffusion weighted imaging was performed. There is acute infarction throughout the right caudate and lentiform nuclei corresponding to the findings on CT. There is also a small amount of cortical and subcortical acute infarction in the anteroinferior right frontal lobe. Mild cerebral atrophy and moderate chronic small vessel ischemic disease are again noted. Chronic lacunar infarcts are present in the cerebral white matter bilaterally and left basal ganglia. There is no intracranial mass effect or extra-axial fluid collection. MRA HEAD FINDINGS The visualized distal vertebral arteries are widely patent to the basilar and codominant. Patent right PICA, left AICA, and bilateral SCA origins are visualized. The basilar artery is widely patent. Posterior communicating arteries are diminutive or absent. PCAs are patent without evidence of significant stenosis. The internal carotid arteries are widely patent from skullbase to carotid termini. There is a 2 mm final shaped outpouching projecting inferiorly from the left supraclinoid ICA in the posterior communicating region. A vessel is not clearly resolved arising from this. There is occlusion of the right MCA at the distal  M1 level just beyond the anterior temporal artery origin. The left MCA and both ACAs are patent without evidence of significant  stenosis. IMPRESSION: 1. Acute infarct involving the right basal ganglia and small portion of the anteroinferior frontal lobe. 2. Distal right M1 MCA occlusion. 3. 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 These results were called by telephone at the time of interpretation on 11/21/2016 at 8:10 am to Dr. Caryl Pina , who verbally acknowledged these results. Electronically Signed   By: [name] M.D.   On: 11/25/2016 08:33   Ct Head Code Stroke W/o Cm  Result Date: 11/10/2016 CLINICAL DATA:  Code stroke. Left-sided weakness, facial droop, and slurred speech. EXAM: CT HEAD WITHOUT CONTRAST TECHNIQUE: Contiguous axial images were obtained from the base of the skull through the vertex without intravenous contrast. COMPARISON:  None. FINDINGS: Brain: There is mild motion artifact towards the vertex. High right frontal hypoattenuation on image 22 is favored to reflect motion artifact over early infarct based on reformats. There is however abnormal hypoattenuation in the right caudate and lentiform nuclei. No intracranial hemorrhage, mass, midline shift, or extra-axial fluid collection is identified. Mild cerebral atrophy is within normal limits for age. Subcortical and deep cerebral white matter hypodensities are nonspecific but compatible with moderate chronic small vessel ischemic disease. Lacunar infarcts are present in the deep cerebral white matter bilaterally and left basal ganglia, likely chronic. Vascular: Abnormal hyperdensity of the right MCA at the distal M1/bifurcation region. Calcified atherosclerosis at the skullbase. Skull: No fracture or focal osseous lesion. Sinuses/Orbits: Chronic left maxillary sinusitis with sinus atelectasis and near complete opacification. Clear mastoid air cells. Bilateral cataract extraction. Other: None. ASPECTS [facility] Stroke Program Early CT Score) - Ganglionic level infarction (caudate, lentiform nuclei, internal capsule,  insula, M1-M3 cortex): 5 - Supraganglionic infarction (M4-M6 cortex): 3 Total score (0-10 with 10 being normal): 8 IMPRESSION: 1. Hyperdense distal right M1/ MCA bifurcation with evidence of acute infarction in the right basal ganglia. 2. ASPECTS is at best 8. 3. No acute intracranial hemorrhage. These results were called by telephone at the time of interpretation on 11/08/2016 at 7:40 am to Dr. Otelia Limes, who verbally acknowledged these results. Electronically Signed   By: [name] M.D.   On: 11/24/2016 07:49    Labs:  CBC:  Recent Labs  11/07/2016 0717 10/28/2016 0721 11/01/2016 1302 11/24/16 0514 11/25/16 0300  WBC 7.4  --  6.0 8.2 12.2*  HGB 11.9* 12.2* 9.6* 9.9* 8.9*  HCT 36.9* 36.0* 30.6* 30.8* 28.5*  PLT 173  --  151 154 164    COAGS:  Recent Labs  04/12/16 1123 11/21/2016 0717 11/24/16 1824  INR 1.13 1.10 1.11  APTT 34 32 22*    BMP:  Recent Labs  11/08/2016 0717 11/18/2016 0721 10/30/2016 1248 11/24/16 0514 11/25/16 0300  NA 138 142 135 138 141  K 3.4* 3.4* 4.1 4.1 3.6  CL 107 106 107 110 113*  CO2 22  --  21* 21* 15*  GLUCOSE 166* 165* 147* 131* 196*  BUN 30* 33* 27* 28* 27*  CALCIUM 8.4*  --  7.7* 7.7* 7.5*  CREATININE 1.85* 1.90* 1.80* 2.03* 2.18*  GFRNONAA 31*  --  32* 28* 26*  GFRAA 36*  --  37* 32* 30*    LIVER FUNCTION TESTS:  Recent Labs  04/12/16 1123 11/09/2016 0717  BILITOT 1.4* 0.8  AST 26 42*  ALT 12* 36  ALKPHOS 74 131*  PROT 7.9  7.7  ALBUMIN 3.3* 3.4*    Assessment and Plan: 1. Acute CVA, s/p right MCA revasc/stent 5/29 Continue ASA and Plavix.  If this is held his stent may occlude and cause worsening CVA or death.  This is a hard place as he is having hematuria secondary to an enlarged prostate.  Urology has seen him and started him on CBI.  Will defer further recommendations and plans to neurology. He will follow up with Dr. Corliss Skains about 2 weeks after discharge from hospital/rehab.  Electronically Signed: Letha Cape 11/25/2016, 11:21 AM   I spent a total of 15 Minutes at the the patient's bedside AND on the patient's hospital floor or unit, greater than 50% of which was counseling/coordinating care for CVA, s/p revascularization

## 2016-11-25 NOTE — Progress Notes (Signed)
  Echocardiogram 2D Echocardiogram has been performed.  Janalyn Harder 11/25/2016, 3:10 PM

## 2016-11-25 NOTE — Progress Notes (Signed)
  Speech Language Pathology Treatment: Dysphagia;Cognitive-Linquistic  Patient Details Name: [name] MRN: [id number] DOB: 02/12/1928 Today's Date: 11/25/2016 Time: 4628-6381 SLP Time Calculation (min) (ACUTE ONLY): 25 min  Assessment / Plan / Recommendation Clinical Impression  Pt demonstrates improving cognitive function though lethargy is still a barrier to ability to take PO. Pt with eyes closed but arousable to speech, able to follow single commands consistently, participate in basic functional tasks (washing face, brushing teeth) through completion.  Pt avoids verbalizations, likely due to lethargy and dysarthria, and uses gestures effectively.  Pt able to attend to motor and sensory activities on the left side of body, but motor movement is severely impaired, particularly of CN VII and CN XII. Pt had no appropriate oral response to ice chip due to drifting off to sleep. Pt is too lethargic for swallow testing or realistic and consistent PO intake and would benefit from short term alternate nutrition until arousal improves. Discussed recommendations at length with pts family. Will follow for readiness.   HPI HPI: Pt is an 83 year old male with cardiac history who presented with acute onset of left-sided weakness and slurred speech. He was not a tpa candidate, but was taken to IR for endovascular thrombectomy on 5/29 and remained intubated until 5/30. MRI on 5/30 revealed acute multifocal RIGHT MCA territory and posterior watershed infarcts, an acute subcentimeter LEFT cerebellar infarct, and RIGHT basal ganglia petechial hemorrhage without lobar hematoma.      SLP Plan  Continue with current plan of care       Recommendations  Diet recommendations: NPO                Oral Care Recommendations: Oral care QID Follow up Recommendations: Inpatient Rehab SLP Visit Diagnosis: Cognitive communication deficit (R41.841);Dysarthria and anarthria (R47.1) Plan: Continue with current plan  of care       GO                [name], Riley Nearing 11/25/2016, 2:28 PM

## 2016-11-25 NOTE — Progress Notes (Addendum)
PULMONARY / CRITICAL CARE MEDICINE   Name: [name] MRN: [id number] DOB: [birth year]    ADMISSION DATE:  10/28/2016 CONSULTATION DATE:  5/29  REFERRING MD:  11/22/2016  CHIEF COMPLAINT:  CVA  HISTORY OF PRESENT ILLNESS:   83 year old male with PMH as below, which is significant for Afib not on anticoagulation (stopped recently due to GI bleed), OSA not on CPAP, Cardiomyopathy, CAD, and DM. 5/28 he presented to [facility] ED with complaints of L sided weakness with L sided facial droop, dysarthria, and left hemineglect. Last seen normal 2330 the prior evening. Upon awakening at 6AM he was noted to have the aforementioned deficits. Code stroke was immediately called in the ED. Intracranial imaging demonstrated acute CVA and the patient was taken to IR for endovascular procedure. Underwent thrombectomy by Dr. Corliss Skains and was sent to ICU for recovery on ventilator.   SUBJECTIVE / Interval events:  Extubated successfully Has had gross hematuria, clotted his Foley catheter which had to be changed to initiate continuous irrigation   VITAL SIGNS: BP (!) 107/47   Pulse 77   Temp 98.3 F (36.8 C) (Oral)   Resp (!) 28   Ht 5\' 5"  (1.651 m)   Wt 79.8 kg (175 lb 14.8 oz)   SpO2 99%   BMI 29.28 kg/m   HEMODYNAMICS:    VENTILATOR SETTINGS:    INTAKE / OUTPUT: I/O last 3 completed shifts: In: 36602.9 [I.V.:2602.9; Other:33000; IV Piggyback:1000] Out: 25366 [Urine:42800]  PHYSICAL EXAMINATION: General:  Elderly man in no distress Neuro:  Wakes up to voice but does not open eyes completely, follows commands right upper extremity and right lower extremity, he cannot move the left. He has some very mild snoring respirations HEENT:  No oral secretions, some snoring respirations they get better when he is awake as above Cardiovascular:  Regular, no murmur, no peripheral edema Lungs:  Clear bilaterally, decreased at bases, no wheezing Abdomen:  Soft, benign, positive bowel  sounds Musculoskeletal: No deformity Skin: No rash  LABS:  BMET  Recent Labs Lab 11/22/2016 1248 11/24/16 0514 11/25/16 0300  NA 135 138 141  K 4.1 4.1 3.6  CL 107 110 113*  CO2 21* 21* 15*  BUN 27* 28* 27*  CREATININE 1.80* 2.03* 2.18*  GLUCOSE 147* 131* 196*    Electrolytes  Recent Labs Lab 11/13/2016 1248 11/24/16 0514 11/25/16 0300  CALCIUM 7.7* 7.7* 7.5*  MG 1.9  --   --   PHOS 3.6  --   --     CBC  Recent Labs Lab 11/15/2016 1302 11/24/16 0514 11/25/16 0300  WBC 6.0 8.2 12.2*  HGB 9.6* 9.9* 8.9*  HCT 30.6* 30.8* 28.5*  PLT 151 154 164    Coag's  Recent Labs Lab 11/15/2016 0717 11/24/16 1824  APTT 32 22*  INR 1.10 1.11    Sepsis Markers No results for input(s): LATICACIDVEN, PROCALCITON, O2SATVEN in the last 168 hours.  ABG  Recent Labs Lab 11/09/2016 1344  PHART 7.439  PCO2ART 34.2  PO2ART 387*    Liver Enzymes  Recent Labs Lab 11/25/2016 0717  AST 42*  ALT 36  ALKPHOS 131*  BILITOT 0.8  ALBUMIN 3.4*    Cardiac Enzymes No results for input(s): TROPONINI, PROBNP in the last 168 hours.  Glucose  Recent Labs Lab 11/24/16 1122 11/24/16 1552 11/24/16 1923 11/24/16 2327 11/25/16 0311 11/25/16 0832  GLUCAP 120* 157* 143* 113* 185* 215*    Imaging No results found.   STUDIES:  5/29 > CT  head shows hyperdense distal right M1/ MCA bifurcation with evidence of acute infarction in the right basal ganglia. ASPECTS is at best 8. No acute intracranial hemorrhage. 5/29 > STAT MRI reveals early right basal ganglia infarction and infarction within the cortex along the inferior aspect of the anterior right frontal lobe. Distal M1 occlusion seen on MRA MRI Brain 5/30 >> acute R MCA CVA and watershed infarcts, acute L cerebellar CVA, R BG petechial hemorrhage without hematoma.   CULTURES: none  ANTIBIOTICS: periop ancef 5/29  SIGNIFICANT EVENTS: 5/29 admit CVA, neuro IR, out to ICU on vent  LINES/TUBES: ETT 5/29 >5/30 Art  line 5/29 >  DISCUSSION: 83 year old male with cardiac history presented with CVA 5/29. He was not a tpa candidate, but was taken to IR for endovascular thrombectomy. He was then sent to ICU for recovery on vent. Extubated 5/30. Course complicated by hematuria, history BPH  ASSESSMENT / PLAN:  NEUROLOGIC A:   R MCA CVA s/p IR revascularization, stent placement L cerebellar CVA R basal ganglial petechial hemorrhage  P:   RASS goal: 0 Echocardiogram pending Currently on antiplatelet medications but anticoagulation on hold both due to his basal ganglial petechial hemorrhage and also his hematuria, recent GI bleeding Carotid Dopplers, final result pending  PULMONARY A: Airway compromise due to need for medical sedation, CVA  P:   Continue pulmonary hygiene. He does have some mild snoring respirations likely due to deficits from his CVA. He does appear to be protecting his airway adequately  CARDIOVASCULAR A:  Hypertension Asymptomatic bradycardia (history of this as well) History of Atrial fibrillation (not currently on anticoagulation due to recent GI bleeding.)  P:  Continue telemetry Hydralazine as needed History of atrial fibrillation, anticoagulation deferred at this time SBP goal 120-140  RENAL A:   Acute on CKD, ? IV Dye load plus / minus hypoperfusion Hypokalemia Mild anion gap metabolic acidosis P:   Follow BMP, urine output (difficult with bladder irrigation)  GASTROINTESTINAL A:   Recent GIB, off anticoagulation  P:   Protonix Nothing by mouth until he passes swallowing evaluation. FEES ordered and SLP following Place feeding tube if he fails swallow test  HEMATOLOGIC A:   Anemia (Baseline HGB 11) Hematuria  P:  Continuous bladder irrigation  Appreciate Urology eval and recs. He may need cystoscopy to control hematuria Continue ASA and plavix for now given over benefit in setting CVA and stent Follow CBC   INFECTIOUS A:   No acute  issues  P:   Follow clinically, fever, WBC  ENDOCRINE A:   DM  P:   Sliding scale insulin and CBG per protocol    FAMILY  - Updates: Spouse updated 5/31 by RB  - Inter-disciplinary family meet or Palliative Care meeting due by:  6/4   Levy Pupa, MD, PhD 11/25/2016, 9:22 AM  Pulmonary and Critical Care [phone] or if no answer [phone]

## 2016-11-25 NOTE — Progress Notes (Signed)
Initial Nutrition Assessment  INTERVENTION:   Initiate Jevity 1.2 @ 50 ml/hr (1200 ml/day) 30 ml Prostat BID Provides: 1640 kcal, 96 grams protein, and 972 ml free water.   NUTRITION DIAGNOSIS:   Inadequate oral intake related to inability to eat as evidenced by NPO status.  GOAL:   Patient will meet greater than or equal to 90% of their needs  MONITOR:   Diet advancement, I & O's, TF tolerance  REASON FOR ASSESSMENT:   Consult Enteral/tube feeding initiation and management  ASSESSMENT:   Pt with PMH of afib admitted with R MCA infarct due to occlusion, s/p revascularization with mechanical thrombectomy and stent placement.    Labs reviewed: Cr 2.18 CBG's: 185-215-222 Per wife and daugher no recent weight changes, good appetite PTA. NO bm since Monday, notified RN. Pt discussed during ICU rounds and with RN.    Diet Order:  Diet NPO time specified  Skin:  Reviewed, no issues  Last BM:  5/28 per wife  Height:   Ht Readings from Last 1 Encounters:  12-07-2016 5\' 5"  (1.651 m)    Weight:   Wt Readings from Last 1 Encounters:  2016-12-07 175 lb 14.8 oz (79.8 kg)    Ideal Body Weight:  61.8 kg  BMI:  Body mass index is 29.28 kg/m.  Estimated Nutritional Needs:   Kcal:  1600-1800  Protein:  90-100 grams  Fluid:  >1.9 L/day  EDUCATION NEEDS:   No education needs identified at this time  Kendell Bane RD, LDN, CNSC [phone] Pager [phone] After Hours Pager

## 2016-11-25 NOTE — Progress Notes (Signed)
STROKE TEAM PROGRESS NOTE   SUBJECTIVE (INTERVAL HISTORY) Patient `s wife is at bedside.He developed hematuria yesterday and urologist feels it is due to enlarged prostate and trauma from the catheter. He required blood transfusion but this morning his urine appears to be clear. He is getting continuous bladder irrigation. I discussed the risk benefits of continuing antiplatelet therapy in the setting of a fresh intracranial stent with Dr. Mena Frazier and recommend we continue antiplatelets . I explained this to the patient's wife who agrees with the plan OBJECTIVE Temp:  [97.6 F (36.4 C)-98.5 F (36.9 C)] 97.8 F (36.6 C) (05/31 1123) Pulse Rate:  [57-99] 77 (05/31 0900) Cardiac Rhythm: Atrial fibrillation (05/31 0800) Resp:  [18-31] 28 (05/31 0900) BP: (92-171)/(39-95) 107/47 (05/31 0900) SpO2:  [95 %-100 %] 99 % (05/31 0900)  CBC:   Recent Labs Lab Dec 21, 2016 1302 11/24/16 0514 11/25/16 0300  WBC 6.0 8.2 12.2*  NEUTROABS 4.3 6.5  --   HGB 9.6* 9.9* 8.9*  HCT 30.6* 30.8* 28.5*  MCV 89.7 90.6 92.8  PLT 151 154 164    Basic Metabolic Panel:   Recent Labs Lab 12-21-2016 1248 11/24/16 0514 11/25/16 0300  NA 135 138 141  K 4.1 4.1 3.6  CL 107 110 113*  CO2 21* 21* 15*  GLUCOSE 147* 131* 196*  BUN 27* 28* 27*  CREATININE 1.80* 2.03* 2.18*  CALCIUM 7.7* 7.7* 7.5*  MG 1.9  --   --   PHOS 3.6  --   --     Lipid Panel:     Component Value Date/Time   CHOL 115 11/24/2016 0514   TRIG 177 (H) 11/24/2016 0514   HDL 21 (L) 11/24/2016 0514   CHOLHDL 5.5 11/24/2016 0514   VLDL 35 11/24/2016 0514   LDLCALC 59 11/24/2016 0514   HgbA1c:  Lab Results  Component Value Date   HGBA1C 8.2 (H) 11/24/2016    PHYSICAL EXAM Elderly Caucasian male who is not in distress. . Afebrile. Head is nontraumatic. Neck is supple without bruit.    Cardiac exam no murmur or gallop. Lungs are clear to auscultation. Distal pulses are well felt. Neurological Exam :  Patient is drowsy but can be  aroused and opens eyes partially. Will follow midline and simple commands only. Right gaze preference. Unable to look to the left. Does not blink to threat on either side. Left lower facial weakness. Tongue midline. Dense left hemiplegia with only slight withdrawal to painful stimuli in the left lower extremity and none in the left upper extremity. Purposeful antigravity movements on the right side   ASSESSMENT/PLAN [name] is a 83 y.o. male with history of of AF on eliquis not on AC, HTN, CAD, CM, OSA who woke with L hemiparesis, dysarthria and L neglect. He did not receive IV t-PA due to delay in arrival. CT and MRI showed hyperdense R MCA. Taken to IR where he received TICI3 revascularization following mechanical thrombectomy and rescue stent placement.   Stroke:   R MCA infarct d/t R MCA M1 occlusion s/p revascularization w/ mechanical thrombectomy and rescue stent placement. Infarct likely due to large vessel disease plus atrial fibrillation. Additional posterior watershed infarct d/t hypoperfusion and L cerebellar infarct.  Code Stroke CT hyperdense R MCA with acute R BG infarct. Aspects 8.  Ltd MRI  Acte R BG and small part frontal lobe infarct.   MRA  Distal R M1 occlusion.  Cerebral angio occluded R MCA M1, Solitaire x 3 passes w/ immediate re-occlusion d/t  severe stenosis. Rescue stent placed w/ resultant TICI3 reperfusion  Post IR CT Pipeline stent R MCA w/ infarct & contrast staining R caudate and putamen. Infarct only subinsular.   MRI R MCA, posterior watershed and L cerebellar infarcts. R BG petechial hemorrhage. Old L BG infarct. Moderate small vessel disease.   Carotid doppler no significant extracranial stenosis  2D Echo   pending   LDL 59  HgbA1c 8.2  SCDs for VTE prophylaxis  Diet NPO time specified   No antithrombotic prior to admission, now on aspirin 325 mg daily and clopidogrel 75 mg daily  Therapy recommendations:  CLR  Disposition:  pending    Respiratory Failure  Intubated for neurointervention  CCM following  Atrial Fibrillation  Home anticoagulation:  none   Eliquis stopped 2 months ago d/t GIB              Hypotension Hx Hypertension  BP goal 120-140 post IR  Sedation weaned  Low dose neo adsded  Stable  Diabetes type II  HgbA1c pending, goal < 7.0  Other Stroke Risk Factors  Advanced age  Former Cigarette smoker  UDS / ETOH level not performed   Overweight, Body mass index is 29.28 kg/m.   Coronary artery disease - MI  Obstructive sleep apnea, unable to use CPAP at home  Cardiomyopathy   Other Active Problems  Hx asymptomatic bradycardia  Hypokalemia  CKD  Recent GIB for which eliquis was stopped  Anemia  Hematuria -enlarged prostate and catheter trauma  Hospital day # 2  I have personally examined this patient, reviewed notes, independently viewed imaging studies, participated in medical decision making and plan of care.ROS completed by me personally and pertinent positives fully documented  I have made any additions or clarifications directly to the above note. Agree with note above.  Presented with right MCA occlusion and infarct. Unclear time of onset and underwent mechanical embolectomy but had recurrent right MCA occlusion likely due to underlying intrinsic stenosis hence underwent emergent pipeline stent placement. He remains at risk for neurological worsening, expansion of hemorrhage and recurrent strokes. He does have AFIB but has previously had GI bleed on eliquis and seemingly not be a good long-term anticoagulation candidate. I had a long discussion at the bedside with the patient's wife, and Dr [name]  about his prognosis, plan for further evaluation, treatment and answered questions.  Continue dual antiplatelet therapy for now due to intracranial stent despite hematuria as risk benefit favors it. May consider anticoagulation after a few weeks if stable given history  of atrial fibrillation. This patient is critically ill and at significant risk of neurological worsening, death and care requires constant monitoring of vital signs, hemodynamics,respiratory and cardiac monitoring, extensive review of multiple databases, frequent neurological assessment, discussion with family, other specialists and medical decision making of high complexity.I have made any additions or clarifications directly to the above note.This critical care time does not reflect procedure time, or teaching time or supervisory time of PA/NP/Med Resident etc but could involve care discussion time.  I spent 42 minutes of neurocritical care time  in the care of  this patient.     Delia Heady, MD Medical Director [facility] Pager: [phone] 11/25/2016 1:23 PM   To contact Stroke Continuity provider, please refer to [email or web address]. After hours, contact General Neurology

## 2016-11-25 NOTE — Progress Notes (Signed)
eLink Physician-Brief Progress Note Patient Name: Lenard Catchings DOB: 1927/12/19 MRN: [id number]   Date of Service  11/25/2016  HPI/Events of Note  Hypotension - Given Hydralazine at 1 AM and now BP = 92/40 with MAP = 53. LVEF = 55-60%.  eICU Interventions  Will order: 1. Bolus with 0.9 NaCl 1 liter IV over 1 hour now.      Intervention Category Major Interventions: Hypotension - evaluation and management  Verity Gilcrest [place] 11/25/2016, 3:15 AM

## 2016-11-25 NOTE — Progress Notes (Addendum)
   Pt sleeping but arousable. No complaints. Nurse reports foley clotted off this AM and she's had trouble hand irrigating it.   Vitals:   11/25/16 0500 11/25/16 0600  BP: (!) 118/95 (!) 121/49  Pulse: 79 79  Resp: (!) 22 (!) 25  Temp:     PE: NAD Abd - soft, NT, bladder not distended GU - foley is further out than I left it. On some traction. They were trying to irrigate with a 60 mL syringe, not a [place]. I tried to irrigate the foley but it was difficult. I noted his bladder filled up and was distended. I deflated the balloon, re advanced and got immediate better irrigation and equal return to decompress bladder. I filled balloon to 20 mL. Minimal clot compared to last night. Turned to pink quickly. CBI restarted. It remained a light pink on a moderate rate.    Impression/Assessment/plan: 1) gross hematuria - this may be a persistent problem with him on anticoagulation. CBI failed early this morning because the balloon was pulled down into the prostate.  He has a very large prostate and may continue to bleed. Serial H/H and supportive care. May need to hold anticoagulation or go to OR for cysto/fulguration. I'm out of town this AM but will check out to on call urologists.   2) BPH - about a 170 g prostate on CT. Continue finasteride  3) Urinary retention - continue foley. Void trial when stable/able.   I was pleased when I left the CBI was running light pink. I discussed patient with my partner on call.

## 2016-11-25 NOTE — Progress Notes (Signed)
PT Cancellation Note  Patient Details Name: [name] MRN: [id number] DOB: [birth year]   Cancelled Treatment:    Reason Eval/Treat Not Completed: Patient not medically ready Pt on bedrest. Will await increase in activity orders prior to PT evaluation.   Blake Divine A Lovelle Lema 11/25/2016, 8:31 AM [name], PT, DPT [phone]

## 2016-11-25 NOTE — Progress Notes (Signed)
OT Cancellation Note  Patient Details Name: [name] MRN: [id number] DOB: [birth year]   Cancelled Treatment:    Reason Eval/Treat Not Completed: Patient not medically ready (on bedrest. ). Please update activity orders when pt appropriate for OT.   [facility] Akshay Spang, OT/L  [phone] 11/25/2016 11/25/2016, 11:13 AM

## 2016-11-25 NOTE — Care Management Note (Signed)
Case Management Note  Patient Details  Name: Babajide Rufenacht MRN: [id number] Date of Birth: 1927/10/14  Subjective/Objective:   Pt admitted on 11/14/2016 s/p acute multifocal RIGHT MCA territory and posterior watershed infarcts, an acute subcentimeter LEFT cerebellar infarct, and RIGHT basal ganglia petechial hemorrhage without lobar hematoma.  PTA, pt independent, lives at home with spouse.                  Action/Plan: PT/OT consults pending, as pt currently remains on bedrest.  He is on continuous bladder irrigation for hematuria.  Will follow progress.    Expected Discharge Date:                  Expected Discharge Plan:  IP Rehab Facility  In-House Referral:     Discharge planning Services  CM Consult  Post Acute Care Choice:    Choice offered to:     DME Arranged:    DME Agency:     HH Arranged:    HH Agency:     Status of Service:  In process, will continue to follow  If discussed at Long Length of Stay Meetings, dates discussed:    Additional Comments:  Quintella Baton, RN, BSN  Trauma/Neuro ICU Case Manager [phone]

## 2016-11-26 LAB — CBC
HEMATOCRIT: 22.7 % — AB (ref 39.0–52.0)
Hemoglobin: 7.4 g/dL — ABNORMAL LOW (ref 13.0–17.0)
MCH: 30.1 pg (ref 26.0–34.0)
MCHC: 32.6 g/dL (ref 30.0–36.0)
MCV: 92.3 fL (ref 78.0–100.0)
PLATELETS: 165 10*3/uL (ref 150–400)
RBC: 2.46 MIL/uL — ABNORMAL LOW (ref 4.22–5.81)
RDW: 16 % — AB (ref 11.5–15.5)
WBC: 11.5 10*3/uL — ABNORMAL HIGH (ref 4.0–10.5)

## 2016-11-26 LAB — GLUCOSE, CAPILLARY
GLUCOSE-CAPILLARY: 248 mg/dL — AB (ref 65–99)
Glucose-Capillary: 225 mg/dL — ABNORMAL HIGH (ref 65–99)
Glucose-Capillary: 126 mg/dL — ABNORMAL HIGH (ref 65–99)
Glucose-Capillary: 85 mg/dL (ref 65–99)
Glucose-Capillary: 161 mg/dL — ABNORMAL HIGH (ref 65–99)

## 2016-11-26 LAB — MAGNESIUM: Magnesium: 2 mg/dL (ref 1.7–2.4)

## 2016-11-26 LAB — HEMOGLOBIN AND HEMATOCRIT, BLOOD
HCT: 17.7 % — ABNORMAL LOW (ref 39.0–52.0)
HEMOGLOBIN: 5.7 g/dL — AB (ref 13.0–17.0)

## 2016-11-26 LAB — PREPARE RBC (CROSSMATCH)

## 2016-11-26 LAB — PHOSPHORUS: PHOSPHORUS: 3.6 mg/dL (ref 2.5–4.6)

## 2016-11-26 LAB — ABO/RH: ABO/RH(D): A POS

## 2016-11-26 MED ORDER — SODIUM CHLORIDE 0.9 % IV SOLN
Freq: Once | INTRAVENOUS | Status: AC
  Administered 2016-11-26: 16:00:00 via INTRAVENOUS

## 2016-11-26 NOTE — Progress Notes (Signed)
PULMONARY / CRITICAL CARE MEDICINE   Name: [name] MRN: [id number] DOB: 04/02/1928    ADMISSION DATE:  December 05, 2016 CONSULTATION DATE:  5/29  REFERRING MD:  12/05/16  CHIEF COMPLAINT:  CVA  HISTORY OF PRESENT ILLNESS:   83 year old male with PMH as below, which is significant for Afib not on anticoagulation (stopped recently due to GI bleed), OSA not on CPAP, Cardiomyopathy, CAD, and DM. 5/28 he presented to [facility] ED with complaints of L sided weakness with L sided facial droop, dysarthria, and left hemineglect. Last seen normal 2330 the prior evening. Upon awakening at 6AM he was noted to have the aforementioned deficits. Code stroke was immediately called in the ED. Intracranial imaging demonstrated acute CVA and the patient was taken to IR for endovascular procedure. Underwent thrombectomy by Dr. Corliss Skains and was sent to ICU for recovery on ventilator.   SUBJECTIVE / Interval events:  Didn't pass swallowing eval on 5/31 On continuous bladder irrigation > still trace blood evident   VITAL SIGNS: BP (!) 126/53   Pulse 63   Temp 98.6 F (37 C) (Axillary)   Resp 17   Ht 5\' 5"  (1.651 m)   Wt 76.4 kg (168 lb 6.9 oz)   SpO2 99%   BMI 28.03 kg/m   HEMODYNAMICS:    VENTILATOR SETTINGS:    INTAKE / OUTPUT: I/O last 3 completed shifts: In: 67033.3 [I.V.:2550; Other:63000; NG/GT:483.3; IV Piggyback:1000] Out: 40981 [Urine:87550]  PHYSICAL EXAMINATION: General:  Elderly man in no distress Neuro:  Some snoring respirations. Wakes to voice cannot open eyes completely, moves right upper and lower extremity, some trace left upper extremity movement now evident HEENT:  No oral secretions, some snoring respirations that improved when he wakes up Cardiovascular:  Regular, no murmur, no peripheral edema Lungs:  Clear bilaterally, decreased at bases, no wheeze Abdomen:  Soft, benign, positive bowel sounds Musculoskeletal: No deformity Skin: No  rash  LABS:  BMET  Recent Labs Lab 2016-12-05 1248 11/24/16 0514 11/25/16 0300  NA 135 138 141  K 4.1 4.1 3.6  CL 107 110 113*  CO2 21* 21* 15*  BUN 27* 28* 27*  CREATININE 1.80* 2.03* 2.18*  GLUCOSE 147* 131* 196*    Electrolytes  Recent Labs Lab 12-05-2016 1248 11/24/16 0514 11/25/16 0300 11/25/16 1512 11/25/16 1850 11/26/16 0508  CALCIUM 7.7* 7.7* 7.5*  --   --   --   MG 1.9  --   --  2.1 2.1 2.0  PHOS 3.6  --   --  3.6 3.4 3.6    CBC  Recent Labs Lab 2016/12/05 1302 11/24/16 0514 11/25/16 0300  WBC 6.0 8.2 12.2*  HGB 9.6* 9.9* 8.9*  HCT 30.6* 30.8* 28.5*  PLT 151 154 164    Coag's  Recent Labs Lab 12-05-16 0717 11/24/16 1824  APTT 32 22*  INR 1.10 1.11    Sepsis Markers No results for input(s): LATICACIDVEN, PROCALCITON, O2SATVEN in the last 168 hours.  ABG  Recent Labs Lab December 05, 2016 1344  PHART 7.439  PCO2ART 34.2  PO2ART 387*    Liver Enzymes  Recent Labs Lab 2016-12-05 0717  AST 42*  ALT 36  ALKPHOS 131*  BILITOT 0.8  ALBUMIN 3.4*    Cardiac Enzymes No results for input(s): TROPONINI, PROBNP in the last 168 hours.  Glucose  Recent Labs Lab 11/25/16 0832 11/25/16 1129 11/25/16 1530 11/25/16 1938 11/25/16 2339 11/26/16 0828  GLUCAP 215* 222* 183* 137* 265* 126*    Imaging Dg Abd Portable  1v  Result Date: 11/25/2016 CLINICAL DATA:  Nasogastric tube placement. EXAM: PORTABLE ABDOMEN - 1 VIEW COMPARISON:  Dec 05, 2016. FINDINGS: Nasogastric tube tip in the region of the gastric pylorus. Side hole in the mid to distal stomach. Normal bowel gas pattern. Cholecystectomy clips. Lumbar and lower thoracic spine degenerative changes. IMPRESSION: Feeding tube tip in the region of the gastric pylorus. Electronically Signed   By: Beckie Salts M.D.   On: 11/25/2016 16:51     STUDIES:  5/29 > CT head shows hyperdense distal right M1/ MCA bifurcation with evidence of acute infarction in the right basal ganglia. ASPECTS is at best 8.  No acute intracranial hemorrhage. 5/29 > STAT MRI reveals early right basal ganglia infarction and infarction within the cortex along the inferior aspect of the anterior right frontal lobe. Distal M1 occlusion seen on MRA MRI Brain 5/30 >> acute R MCA CVA and watershed infarcts, acute L cerebellar CVA, R BG petechial hemorrhage without hematoma.  TTE 5/31 >> EF 50-55% (slightly lower than 2017), moderate LVH, severe left atrial enlargement and moderate right atrial enlargement, RVSP 53 mmHg  CULTURES: none  ANTIBIOTICS: periop ancef 5/29  SIGNIFICANT EVENTS: 5/29 admit CVA, neuro IR, out to ICU on vent  LINES/TUBES: ETT 5/29 >5/30 Art line 5/29 > 5/30 3-way foley 5/30 >>   DISCUSSION: 83 year old male with cardiac history presented with CVA 5/29. He was not a tpa candidate, but was taken to IR for endovascular thrombectomy. He was then sent to ICU for recovery on vent. Extubated 5/30. Course complicated by hematuria, history BPH  ASSESSMENT / PLAN:  NEUROLOGIC A:   R MCA CVA s/p IR revascularization, stent placement L cerebellar CVA R basal ganglial petechial hemorrhage  P:   RASS goal: 0 Carotid Dopplers note mild stenosis of bilateral internal carotid arteries Continue aspirin and Plavix. Anticoagulation on hold. May need to make adjustments if his hematuria continues. Would very much like to continue the antiplatelet therapy given his stent placement  PULMONARY A: Airway compromise due to need for medical sedation, CVA  P:   Continue pulmonary hygiene. Adequate airway protection at this time  CARDIOVASCULAR A:  Hypertension Asymptomatic bradycardia (history of this as well) History of Atrial fibrillation (not currently on anticoagulation due to recent GI bleeding.)  P:  Continue telemetry Hydralazine as needed Anticoagulation deferred for his atrial fibrillation Goal SBP 120-140  RENAL A:   Acute on CKD, ? IV Dye load plus / minus  hypoperfusion Hypokalemia Mild anion gap metabolic acidosis P:   Stabilizing, follow BMP and urine output  GASTROINTESTINAL A:   Recent GIB, off anticoagulation  P:   Protonix as ordered Follow-up speech evaluation on 6/1 Place feeding tube if he fails swallow evaluation  HEMATOLOGIC A:   Anemia (Baseline HGB 11) Hematuria  P:  Appreciate urology evaluation and recommendations. Continuous bladder irrigation ongoing. Would very much like to avoid discontinuation of his antiplatelet therapy given his stent placement. He may ultimately need this and/or cystoscopy to manage the hematuria Follow CBC   INFECTIOUS A:   No acute issues  P:   Follow fever, WBC, clinical status  ENDOCRINE A:   DM  P:   Sliding-scale insulin and CBG per protocol    FAMILY  - Updates: Spouse updated 6/1  by RB  - Inter-disciplinary family meet or Palliative Care meeting due by:  6/4  PCCM will sign off. Please call if we can assist in any way.    Levy Pupa, MD,  PhD 11/26/2016, 9:51 AM  Pulmonary and Critical Care [phone] or if no answer [phone]

## 2016-11-26 NOTE — Procedures (Signed)
Objective Swallowing Evaluation: Type of Study: FEES-Fiberoptic Endoscopic Evaluation of Swallow  Patient Details  Name: [name] MRN: [id number] Date of Birth: February 24, 1928  Today's Date: 11/26/2016 Time: SLP Start Time (ACUTE ONLY): 1133-SLP Stop Time (ACUTE ONLY): 1200 SLP Time Calculation (min) (ACUTE ONLY): 27 min  Past Medical History:  Past Medical History:  Diagnosis Date  . Anemia   . BPH (benign prostatic hypertrophy)   . Cardiomyopathy (HCC)   . Coronary artery disease   . Diverticulosis   . Essential hypertension, benign   . GI bleed 2015  . History of kidney stones   . Hypersomnia with sleep apnea, unspecified   . MI (myocardial infarction) (HCC) 1996  . Occasional tremors   . Other and unspecified hyperlipidemia   . Pure hypercholesterolemia   . Sleep apnea    unable to use C-PAP  . Type II or unspecified type diabetes mellitus without mention of complication, uncontrolled    Past Surgical History:  Past Surgical History:  Procedure Laterality Date  . CARDIAC CATHETERIZATION    . CHOLECYSTECTOMY  1994  . CORONARY ANGIOPLASTY  1996   [place]  . CYSTOSCOPY W/ URETERAL STENT PLACEMENT Left 05/31/2016   Procedure: CYSTOSCOPY WITH STENT REPLACEMENT;  Surgeon: [name], MD;  Location: [facility];  Service: Urology;  Laterality: Left;  . CYSTOSCOPY WITH LITHOLAPAXY N/A 05/11/2016   Procedure: CYSTOSCOPY WITH LITHOLAPAXY;  Surgeon: [name], MD;  Location: [facility];  Service: Urology;  Laterality: N/A;  . CYSTOSCOPY/URETEROSCOPY/HOLMIUM LASER/STENT PLACEMENT Left 05/11/2016   Procedure: CYSTOSCOPY/URETEROSCOPY/HOLMIUM LASER/STENT PLACEMENT;  Surgeon: [name], MD;  Location: [facility];  Service: Urology;  Laterality: Left;  . ESOPHAGOGASTRODUODENOSCOPY (EGD) WITH PROPOFOL Left 11/23/2015   Procedure: ESOPHAGOGASTRODUODENOSCOPY (EGD) WITH PROPOFOL;  Surgeon: [name], MD;  Location: [facility] ENDOSCOPY;  Service: Endoscopy;  Laterality: Left;  . EYE  SURGERY Bilateral    Cataract Extraction with IOL   . FLEXIBLE SIGMOIDOSCOPY    . HERNIA REPAIR Left    Inguinal Hernia Repair  . inguinal hernia repair     left inguinal   . INSERTION OF SUPRAPUBIC CATHETER N/A 05/31/2016   Procedure: INSERTION OF SUPRAPUBIC CATHETER;  Surgeon: [name], MD;  Location: [facility];  Service: Urology;  Laterality: N/A;  . IR GENERIC HISTORICAL  04/12/2016   IR NEPHROSTOMY PLACEMENT LEFT 04/12/2016 [name], MD [facility]-INTERV RAD  . IR PERCUTANEOUS ART THROMBECTOMY/INFUSION INTRACRANIAL INC DIAG ANGIO  21-Dec-2016  . KNEE ARTHROSCOPY Left   . NEPHROSTOMY  05/11/2016   Procedure: NEPHROSTOMY tude removal;  Surgeon: [name], MD;  Location: [facility];  Service: Urology;;  . RADIOLOGY WITH ANESTHESIA N/A 12/21/2016   Procedure: RADIOLOGY WITH ANESTHESIA;  Surgeon: Julieanne Cotton, MD;  Location: [facility] OR;  Service: Radiology;  Laterality: N/A;  . TONSILLECTOMY    . URETEROSCOPY  05/31/2016   Procedure: URETEROSCOPY;  Surgeon: [name], MD;  Location: [facility];  Service: Urology;;   HPI: Pt is an 83 year old male with cardiac history who presented with acute onset of left-sided weakness and slurred speech. He was not a tpa candidate, but was taken to IR for endovascular thrombectomy on 5/29 and remained intubated until 5/30. MRI on 5/30 revealed acute multifocal RIGHT MCA territory and posterior watershed infarcts, an acute subcentimeter LEFT cerebellar infarct, and RIGHT basal ganglia petechial hemorrhage without lobar hematoma.  Subjective: pt drowsy   Assessment / Plan / Recommendation  CHL IP CLINICAL IMPRESSIONS 11/26/2016  Clinical Impression Pt demonstrates a moderate to severe oral  and pharyngeal dysphagia with sensorimotor deficits.  Thick standing secretions noted, bruising and bleeding in the right nare (SLP used L Nare) pharyngeal wall and arytenoid where NG tube was present.  Oral phase is impacted by decreased arousal and lingual weakness  with poor bolus formation and propulsion and intermittently decreased labial seal. Anterior spillage and oral residuals noted. Pharyngeal phase characterized by significant left sided weakness of the pharyngeal wall, apparent weakness of the hyolaryngeal complex with base of tongue weakness and severet vallecular and pyriform sinus residuals, L>R. Laryngeal closure/airway protection incomplete with ongoing ulcerations of the posterior portion of the bilateral vocal folds suggesting prior ETT placement. Pt attempts to follow commands for use of strategies, but weakness and lethargy are barriers to success. Overall pt has silent penetration during the swallow of all textures and severe residuals, increasing in severity with thicker boluses. Recommend pt remain NPO until he can consistently sustain arousal for regular ice chip trials and then attempt retest. Discussed with pt and family.   SLP Visit Diagnosis Dysphagia, oropharyngeal phase (R13.12)  Attention and concentration deficit following --  Frontal lobe and executive function deficit following --  Impact on safety and function Severe aspiration risk      CHL IP TREATMENT RECOMMENDATION 11/26/2016  Treatment Recommendations Therapy as outlined in treatment plan below     Prognosis 11/26/2016  Prognosis for Safe Diet Advancement Good  Barriers to Reach Goals --  Barriers/Prognosis Comment --    CHL IP DIET RECOMMENDATION 11/26/2016  SLP Diet Recommendations Alternative means - temporary  Liquid Administration via --  Medication Administration Via alternative means  Compensations --  Postural Changes --      CHL IP OTHER RECOMMENDATIONS 11/26/2016  Recommended Consults --  Oral Care Recommendations Oral care QID  Other Recommendations Have oral suction available      CHL IP FOLLOW UP RECOMMENDATIONS 11/26/2016  Follow up Recommendations Inpatient Rehab      CHL IP FREQUENCY AND DURATION 11/26/2016  Speech Therapy Frequency (ACUTE ONLY) min  2x/week  Treatment Duration 2 weeks           CHL IP ORAL PHASE 11/26/2016  Oral Phase Impaired  Oral - Pudding Teaspoon --  Oral - Pudding Cup --  Oral - Honey Teaspoon --  Oral - Honey Cup --  Oral - Nectar Teaspoon --  Oral - Nectar Cup --  Oral - Nectar Straw --  Oral - Thin Teaspoon Decreased bolus cohesion;Premature spillage;Lingual/palatal residue;Weak lingual manipulation;Reduced posterior propulsion  Oral - Thin Cup --  Oral - Thin Straw Decreased bolus cohesion;Premature spillage;Lingual/palatal residue;Weak lingual manipulation;Reduced posterior propulsion  Oral - Puree Decreased bolus cohesion;Premature spillage;Lingual/palatal residue;Weak lingual manipulation;Reduced posterior propulsion  Oral - Mech Soft --  Oral - Regular --  Oral - Multi-Consistency --  Oral - Pill --  Oral Phase - Comment --    CHL IP PHARYNGEAL PHASE 11/26/2016  Pharyngeal Phase Impaired  Pharyngeal- Pudding Teaspoon --  Pharyngeal --  Pharyngeal- Pudding Cup --  Pharyngeal --  Pharyngeal- Honey Teaspoon --  Pharyngeal --  Pharyngeal- Honey Cup --  Pharyngeal --  Pharyngeal- Nectar Teaspoon --  Pharyngeal --  Pharyngeal- Nectar Cup --  Pharyngeal --  Pharyngeal- Nectar Straw --  Pharyngeal --  Pharyngeal- Thin Teaspoon --  Pharyngeal --  Pharyngeal- Thin Cup --  Pharyngeal --  Pharyngeal- Thin Straw Penetration/Aspiration during swallow;Delayed swallow initiation-vallecula;Reduced epiglottic inversion;Reduced tongue base retraction;Reduced pharyngeal peristalsis;Reduced laryngeal elevation;Pharyngeal residue - valleculae;Lateral channel residue  Pharyngeal Material enters airway,  remains ABOVE vocal cords and not ejected out  Pharyngeal- Puree Delayed swallow initiation-vallecula;Reduced laryngeal elevation;Reduced airway/laryngeal closure;Reduced tongue base retraction;Reduced anterior laryngeal mobility;Reduced epiglottic inversion;Reduced pharyngeal peristalsis;Penetration/Aspiration  during swallow;Trace aspiration;Pharyngeal residue - valleculae;Lateral channel residue;Pharyngeal residue - pyriform  Pharyngeal --  Pharyngeal- Mechanical Soft --  Pharyngeal --  Pharyngeal- Regular --  Pharyngeal --  Pharyngeal- Multi-consistency --  Pharyngeal --  Pharyngeal- Pill --  Pharyngeal --  Pharyngeal Comment --     No flowsheet data found.  No flowsheet data found. Harlon Ditty, MA CCC-SLP [phone]  Claudine Mouton 11/26/2016, 2:48 PM

## 2016-11-26 NOTE — Evaluation (Signed)
Occupational Therapy Evaluation  Patient Details Name: [name] MRN: [id number] DOB: 1928/06/27 Today's Date: 11/26/2016    History of Present Illness Patient is a 83 y/o male who presents with L sided weakness, facial droop, dysarthria, and left hemineglect. Pt with R MCA infarct d/t R MCA M1 occlusion s/p revascularization w/ mechanical thrombectomy and rescue stent placement. MRI- Acute R BG and small part frontal lobe infarct. Pt on continuous bladder irrigation due to hematuria. PMH includes A-fib, CAD, cardiomyopathy, HTN, MI, DM.   Clinical Impression   PT admitted with R MCA s/p revascularizatino with mechanical thrombectomy and stent placement.. Pt currently with functional limitiations due to the deficits listed below (see OT problem list). PTA was independent. Pt with total (A) for adls at this time with cognitive deficits. Pt with continuous bladder irrigation at this time.  Pt will benefit from skilled OT to increase their independence and safety with adls and balance to allow discharge SNF.     Follow Up Recommendations  SNF    Equipment Recommendations  Other (comment) (defer to SNF)    Recommendations for Other Services       Precautions / Restrictions Precautions Precautions: Fall Precaution Comments: left hemiplegia; mitt; cortrak Restrictions Weight Bearing Restrictions: No      Mobility Bed Mobility Overal bed mobility: Needs Assistance Bed Mobility: Supine to Sit;Rolling Rolling: Max assist   Supine to sit: +2 for physical assistance;Max assist     General bed mobility comments: requires log roll R and L for pad placement. pt requires (A) to elevate trunk from bed surface. pt pushing with R UE   Transfers                 General transfer comment: not attempted at this time    Balance Overall balance assessment: Needs assistance Sitting-balance support: Single extremity supported;Feet supported Sitting balance-Leahy Scale: Zero                                      ADL either performed or assessed with clinical judgement   ADL Overall ADL's : Needs assistance/impaired Eating/Feeding: NPO   Grooming: Wash/dry face;Minimal assistance;Sitting Grooming Details (indicate cue type and reason): requires cues even with oral drainage Upper Body Bathing: Total assistance   Lower Body Bathing: Total assistance   Upper Body Dressing : Total assistance   Lower Body Dressing: Total assistance                 General ADL Comments: pt supine to sit only this session with decr BP noted.      Vision         Perception     Praxis      Pertinent Vitals/Pain Pain Assessment: No/denies pain     Hand Dominance     Extremity/Trunk Assessment Upper Extremity Assessment Upper Extremity Assessment: LUE deficits/detail LUE Deficits / Details: supine bicep/ tricep noted. no active movement of elbow or wrist or digits. edema noted    Lower Extremity Assessment Lower Extremity Assessment: Defer to PT evaluation       Communication Communication Communication: No difficulties   Cognition Arousal/Alertness: Awake/alert Behavior During Therapy: WFL for tasks assessed/performed Overall Cognitive Status: Impaired/Different from baseline Area of Impairment: Orientation;Awareness;Following commands                 Orientation Level: Disoriented to;Time;Place     Following Commands: Follows one step commands  inconsistently;Follows one step commands with increased time   Awareness: Intellectual   General Comments: pt perseverating on removal of mitten on R hand. pt states "take this off" pt reports month as May but unable to problem solve JUne. pt reports "august" as the month following may.    General Comments  BP decrease 141/124 to 114/49 with static sitting . Pt return to supine with RN in room    Exercises     Shoulder Instructions      Home Living Family/patient expects to be  discharged to:: Private residence Living Arrangements: Spouse/significant other Available Help at Discharge: Family Type of Home: House Home Access: Stairs to enter Secretary/administrator of Steps: 4 Entrance Stairs-Rails: Right Home Layout: One level               Home Equipment: Walker - 2 wheels          Prior Functioning/Environment Level of Independence: Independent        Comments: Pt. does not use AD at baseline however, wife mentions that pt. ambulates primarily within home and reaches for furniture or walls for UE support with mobility         OT Problem List: Decreased strength;Decreased activity tolerance;Impaired balance (sitting and/or standing);Decreased cognition;Decreased safety awareness;Decreased knowledge of use of DME or AE;Decreased knowledge of precautions;Obesity;Impaired UE functional use      OT Treatment/Interventions: Self-care/ADL training;Therapeutic exercise;DME and/or AE instruction;Therapeutic activities;Cognitive remediation/compensation;Patient/family education;Balance training    OT Goals(Current goals can be found in the care plan section) Acute Rehab OT Goals Patient Stated Goal: to get R hand mitten off OT Goal Formulation: Patient unable to participate in goal setting Time For Goal Achievement: 12/10/16 Potential to Achieve Goals: Good  OT Frequency: Min 2X/week   Barriers to D/C:            Co-evaluation PT/OT/SLP Co-Evaluation/Treatment: Yes Reason for Co-Treatment: Complexity of the patient's impairments (multi-system involvement);Necessary to address cognition/behavior during functional activity;For patient/therapist safety;To address functional/ADL transfers PT goals addressed during session: Mobility/safety with mobility;Balance OT goals addressed during session: ADL's and self-care;Proper use of Adaptive equipment and DME;Strengthening/ROM      AM-PAC PT "6 Clicks" Daily Activity     Outcome Measure Help from  another person eating meals?: Total Help from another person taking care of personal grooming?: Total Help from another person toileting, which includes using toliet, bedpan, or urinal?: Total Help from another person bathing (including washing, rinsing, drying)?: Total Help from another person to put on and taking off regular upper body clothing?: Total Help from another person to put on and taking off regular lower body clothing?: Total 6 Click Score: 6   End of Session Equipment Utilized During Treatment: Oxygen Nurse Communication: Mobility status;Precautions  Activity Tolerance: Patient tolerated treatment well Patient left: in bed;with call bell/phone within reach;with bed alarm set;with family/visitor present  OT Visit Diagnosis: Unsteadiness on feet (R26.81);Hemiplegia and hemiparesis Hemiplegia - Right/Left: Left Hemiplegia - dominant/non-dominant: Non-Dominant Hemiplegia - caused by: Cerebral infarction                Time: 4540-9811 OT Time Calculation (min): 28 min Charges:  OT General Charges $OT Visit: 1 Procedure OT Evaluation $OT Eval High Complexity: 1 Procedure G-Codes:      Mateo Flow   OTR/L Pager: [phone] Office: [phone] .   Boone Master B 11/26/2016, 3:20 PM

## 2016-11-26 NOTE — Evaluation (Signed)
Physical Therapy Evaluation Patient Details Name: [name] MRN: [id number] DOB: July 10, 1927 Today's Date: 11/26/2016   History of Present Illness  Patient is a 83 y/o male who presents with L sided weakness, facial droop, dysarthria, and left hemineglect. Pt with R MCA infarct d/t R MCA M1 occlusion s/p revascularization w/ mechanical thrombectomy and rescue stent placement. MRI- Acute R BG and small part frontal lobe infarct. Pt on continuous bladder irrigation due to hematuria. PMH includes A-fib, CAD, cardiomyopathy, HTN, MI, DM.  Clinical Impression  Patient presents with left hemiparesis, confusion, impaired sitting balance, decreased activity tolerance and impaired mobility s/p above. Tolerated sitting EOB ~15 mins with total A with pushing with RUE. BP dropped with activity, RN aware. Pt lives with wife and furniture walker PTA. Would benefit from ST SNF to maximize independence and mobility and ease burden of care prior to return home. Will follow acutely.    Follow Up Recommendations SNF    Equipment Recommendations  Other (comment) (defer to SNF)    Recommendations for Other Services       Precautions / Restrictions Precautions Precautions: Fall Precaution Comments: left hemiplegia; mitt; cortrak Restrictions Weight Bearing Restrictions: No      Mobility  Bed Mobility Overal bed mobility: Needs Assistance Bed Mobility: Supine to Sit;Rolling;Sit to Supine Rolling: Max assist   Supine to sit: +2 for physical assistance;Max assist Sit to supine: Total assist;+2 for physical assistance   General bed mobility comments: requires log roll R and L for pad placement. pt requires (A) to elevate trunk from bed surface. pt pushing with R UE   Transfers                 General transfer comment: not attempted at this time  Ambulation/Gait                Stairs            Wheelchair Mobility    Modified Rankin (Stroke Patients Only) Modified Rankin  (Stroke Patients Only) Pre-Morbid Rankin Score: No significant disability Modified Rankin: Severe disability     Balance Overall balance assessment: Needs assistance Sitting-balance support: Feet supported;No upper extremity supported Sitting balance-Leahy Scale: Zero Sitting balance - Comments: Total A for support sitting EOB. BP stable. Pushing with RUE Postural control: Posterior lean;Left lateral lean                                   Pertinent Vitals/Pain Pain Assessment: Faces Faces Pain Scale: No hurt    Home Living Family/patient expects to be discharged to:: Private residence Living Arrangements: Spouse/significant other Available Help at Discharge: Family Type of Home: House Home Access: Stairs to enter Entrance Stairs-Rails: Right Entrance Stairs-Number of Steps: 4 Home Layout: One level Home Equipment: Walker - 2 wheels      Prior Function Level of Independence: Independent         Comments: Pt. does not use AD at baseline however, wife mentions that pt. ambulates primarily within home and reaches for furniture or walls for UE support with mobility      Hand Dominance        Extremity/Trunk Assessment   Upper Extremity Assessment Upper Extremity Assessment: Defer to OT evaluation LUE Deficits / Details: supine bicep/ tricep noted. no active movement of elbow or wrist or digits. edema noted     Lower Extremity Assessment Lower Extremity Assessment: LLE deficits/detail LLE Deficits / Details:  Trace in quads and hamstrings; able to wiggle toes minimally. Nothing against gravity. LLE Sensation: decreased light touch       Communication   Communication: No difficulties  Cognition Arousal/Alertness: Awake/alert Behavior During Therapy: WFL for tasks assessed/performed Overall Cognitive Status: Impaired/Different from baseline Area of Impairment: Orientation;Awareness;Following commands                 Orientation Level:  Disoriented to;Time;Place     Following Commands: Follows one step commands inconsistently;Follows one step commands with increased time   Awareness: Intellectual   General Comments: pt perseverating on removal of mitten on R hand. pt states "take this off" pt reports month as May but unable to problem solve JUne. pt reports "august" as the month following may.       General Comments General comments (skin integrity, edema, etc.): BP 141/124 sitting EOB; decreased to 118/49 post activity.    Exercises     Assessment/Plan    PT Assessment Patient needs continued PT services  PT Problem List Decreased strength;Decreased mobility;Impaired tone;Decreased safety awareness;Decreased range of motion;Decreased activity tolerance;Decreased cognition;Cardiopulmonary status limiting activity;Impaired sensation;Decreased balance       PT Treatment Interventions Therapeutic activities;Gait training;Therapeutic exercise;Patient/family education;Wheelchair mobility training;Balance training;Functional mobility training;Neuromuscular re-education;DME instruction;Cognitive remediation    PT Goals (Current goals can be found in the Care Plan section)  Acute Rehab PT Goals Patient Stated Goal: to get R hand mitten off PT Goal Formulation: With patient Time For Goal Achievement: 12/10/16 Potential to Achieve Goals: Fair    Frequency Min 3X/week   Barriers to discharge        Co-evaluation PT/OT/SLP Co-Evaluation/Treatment: Yes Reason for Co-Treatment: Complexity of the patient's impairments (multi-system involvement);Necessary to address cognition/behavior during functional activity;For patient/therapist safety;To address functional/ADL transfers PT goals addressed during session: Mobility/safety with mobility;Balance OT goals addressed during session: ADL's and self-care;Proper use of Adaptive equipment and DME;Strengthening/ROM       AM-PAC PT "6 Clicks" Daily Activity  Outcome Measure  Difficulty turning over in bed (including adjusting bedclothes, sheets and blankets)?: Total Difficulty moving from lying on back to sitting on the side of the bed? : Total Difficulty sitting down on and standing up from a chair with arms (e.g., wheelchair, bedside commode, etc,.)?: Total Help needed moving to and from a bed to chair (including a wheelchair)?: Total Help needed walking in hospital room?: Total Help needed climbing 3-5 steps with a railing? : Total 6 Click Score: 6    End of Session   Activity Tolerance: Other (comment);Patient limited by fatigue (BP) Patient left: in bed;with call bell/phone within reach;with SCD's reapplied;with nursing/sitter in room;with restraints reapplied Nurse Communication: Mobility status;Need for lift equipment PT Visit Diagnosis: Hemiplegia and hemiparesis Hemiplegia - Right/Left: Left Hemiplegia - dominant/non-dominant: Non-dominant Hemiplegia - caused by: Cerebral infarction    Time: 8978-4784 PT Time Calculation (min) (ACUTE ONLY): 28 min   Charges:   PT Evaluation $PT Eval Moderate Complexity: 1 Procedure     PT G Codes:        [name], PT, DPT [phone]    Blake Divine A Kamea Dacosta 11/26/2016, 3:29 PM

## 2016-11-26 NOTE — Progress Notes (Signed)
Cortrak Tube Team Note:  Consult received to place a Cortrak feeding tube.   A 10 F Cortrak tube was placed in the L nare and secured with a nasal bridle at 86 cm. Per the Cortrak monitor reading the tube tip is post-pyloric.   No x-ray is required. RN may begin using tube.    If the tube becomes dislodged please keep the tube and contact the Cortrak team at [email or web address] (password [email or web address]) for replacement.  If after hours and replacement cannot be delayed, place a NG tube and confirm placement with an abdominal x-ray.      Trenton Gammon, MS, RD, LDN, [facility] Inpatient Clinical Dietitian Pager # [phone] After hours/weekend pager # [phone]

## 2016-11-26 NOTE — Progress Notes (Signed)
  Speech Language Pathology Treatment: Dysphagia  Patient Details Name: [name] MRN: [id number] DOB: Mar 16, 1928 Today's Date: 11/26/2016 Time: 1020-1040 SLP Time Calculation (min) (ACUTE ONLY): 20 min  Assessment / Plan / Recommendation Clinical Impression  Pt able to sustain attention to PO today, but demonstrates multiple swallows, throat clearing and wet vocal quality, concerned for pharyngeal residuals. Recommend FEES for objective assessment of swallowing prior to diet initiation or decision about feeding tube (NG came out last night).   HPI HPI: Pt is an 83 year old male with cardiac history who presented with acute onset of left-sided weakness and slurred speech. He was not a tpa candidate, but was taken to IR for endovascular thrombectomy on 5/29 and remained intubated until 5/30. MRI on 5/30 revealed acute multifocal RIGHT MCA territory and posterior watershed infarcts, an acute subcentimeter LEFT cerebellar infarct, and RIGHT basal ganglia petechial hemorrhage without lobar hematoma.      SLP Plan  Other (Comment) (FEES)       Recommendations  Diet recommendations: NPO                Oral Care Recommendations: Oral care QID Follow up Recommendations: Inpatient Rehab Plan: Other (Comment) (FEES)       GO               Charles Ditty, MA CCC-SLP [phone]  [name] 11/26/2016, 11:29 AM

## 2016-11-26 NOTE — Progress Notes (Signed)
Called plebotomy about urgent need for type and screen.

## 2016-11-26 NOTE — Progress Notes (Signed)
Called phlebotomy to notify about urgent need for H&H draw

## 2016-11-26 NOTE — Progress Notes (Signed)
PT Cancellation Note  Patient Details Name: [name] MRN: [id number] DOB: 19-Feb-1928   Cancelled Treatment:    Reason Eval/Treat Not Completed: Patient at procedure or test/unavailable Pt getting cortrak placed. Will follow up as time allows.   Blake Divine A Annissa Andreoni 11/26/2016, 1:23 PM [name], PT, DPT [phone]

## 2016-11-26 NOTE — Progress Notes (Signed)
Called phlebotomy about need for H&H draw.

## 2016-11-26 NOTE — Progress Notes (Signed)
Pt temp up from 98.11F to 99.53F. CCM notified and said it was okay to still transfuse second unit of blood for HGB of 5.7.

## 2016-11-26 NOTE — Progress Notes (Signed)
3 Days Post-Op Subjective: Patient is having swallow eval.  No complaints except head itching.  CBIs not running as bag just emptied.  Pt clotted off very quickly and RN states he'd already had to hand irrigate foley earlier today.    Objective: Vital signs in last 24 hours: Temp:  [97.6 F (36.4 C)-99 F (37.2 C)] 97.9 F (36.6 C) (06/01 1200) Pulse Rate:  [56-90] 65 (06/01 1100) Resp:  [15-25] 16 (06/01 1100) BP: (104-143)/(46-86) 114/51 (06/01 1100) SpO2:  [97 %-100 %] 98 % (06/01 1100) Weight:  [76.4 kg (168 lb 6.9 oz)-77.7 kg (171 lb 4.8 oz)] 76.4 kg (168 lb 6.9 oz) (06/01 0500)  Intake/Output from previous day: 05/31 0701 - 06/01 0700 In: 32283.3 [I.V.:1800; NG/GT:483.3] Out: 27253 [Urine:46525] Intake/Output this shift: Total I/O In: -  Out: 66440 [Urine:13250]  Physical Exam:  General:cooperative and no distress GU: uncirc penis with no drainage or skin breakdown; 22 3-way foley in place with CBIs connected; fluid in bag light red  Lab Results:  Recent Labs  11/24/16 0514 11/25/16 0300 11/26/16 1204  HGB 9.9* 8.9* 5.7*  HCT 30.8* 28.5* 17.7*   BMET  Recent Labs  11/24/16 0514 11/25/16 0300  NA 138 141  K 4.1 3.6  CL 110 113*  CO2 21* 15*  GLUCOSE 131* 196*  BUN 28* 27*  CREATININE 2.03* 2.18*  CALCIUM 7.7* 7.5*    Recent Labs  11/24/16 1824  INR 1.11   No results for input(s): LABURIN in the last 72 hours. Results for orders placed or performed during the hospital encounter of 11/07/2016  MRSA PCR Screening     Status: None   Collection Time: 11/02/2016  1:44 PM  Result Value Ref Range Status   MRSA by PCR NEGATIVE NEGATIVE Final    Comment:        The GeneXpert MRSA Assay (FDA approved for NASAL specimens only), is one component of a comprehensive MRSA colonization surveillance program. It is not intended to diagnose MRSA infection nor to guide or monitor treatment for MRSA infections.     Studies/Results: Dg Abd Portable  1v  Result Date: 11/25/2016 CLINICAL DATA:  Nasogastric tube placement. EXAM: PORTABLE ABDOMEN - 1 VIEW COMPARISON:  11/22/2016. FINDINGS: Nasogastric tube tip in the region of the gastric pylorus. Side hole in the mid to distal stomach. Normal bowel gas pattern. Cholecystectomy clips. Lumbar and lower thoracic spine degenerative changes. IMPRESSION: Feeding tube tip in the region of the gastric pylorus. Electronically Signed   By: Beckie Salts M.D.   On: 11/25/2016 16:51    Assessment/Plan:   1) gross hematuria - problem persists.  RN hand irrigated cath again with clot return and CBIs were restarted with pink fluid output.  Continue CBIs and try to wean rate later today.  He is likely going to require intervention with cysto/fulguration as H/H has continued to drop.  He is getting a transfusion today.  NPO after MN.    2) BPH- about a 170 g prostate on CT. Continue finasteride  3) Urinary retention - continue foley. Void trial when stable/able.      LOS: 3 days   Ahaan Zobrist 11/26/2016, 1:45 PM

## 2016-11-26 NOTE — Progress Notes (Signed)
Released blood at 1752, lab informed me they would call me when blood is ready. Lab has not called yet. Will inform night shift nurse.

## 2016-11-26 NOTE — Progress Notes (Signed)
Notified MD Byrum about critical H&H results

## 2016-11-26 NOTE — Progress Notes (Signed)
STROKE TEAM PROGRESS NOTE   SUBJECTIVE (INTERVAL HISTORY) Patient `s wife is at bedside.He continues to have hematuria   . I discussed the risk benefits of continuing antiplatelet therapy in the setting of a fresh intracranial stent with   to the patient's wife who agrees with the plan OBJECTIVE Temp:  [97.6 F (36.4 C)-99 F (37.2 C)] 97.9 F (36.6 C) (06/01 1200) Pulse Rate:  [56-90] 85 (06/01 1400) Cardiac Rhythm: Atrial fibrillation (06/01 0800) Resp:  [16-29] 29 (06/01 1400) BP: (104-143)/(46-86) 120/50 (06/01 1400) SpO2:  [97 %-100 %] 99 % (06/01 1400) Weight:  [168 lb 6.9 oz (76.4 kg)-171 lb 4.8 oz (77.7 kg)] 168 lb 6.9 oz (76.4 kg) (06/01 0500)  CBC:   Recent Labs Lab 11-30-2016 1302 11/24/16 0514 11/25/16 0300 11/26/16 1204  WBC 6.0 8.2 12.2*  --   NEUTROABS 4.3 6.5  --   --   HGB 9.6* 9.9* 8.9* 5.7*  HCT 30.6* 30.8* 28.5* 17.7*  MCV 89.7 90.6 92.8  --   PLT 151 154 164  --     Basic Metabolic Panel:   Recent Labs Lab 11/24/16 0514 11/25/16 0300  11/25/16 1850 11/26/16 0508  NA 138 141  --   --   --   K 4.1 3.6  --   --   --   CL 110 113*  --   --   --   CO2 21* 15*  --   --   --   GLUCOSE 131* 196*  --   --   --   BUN 28* 27*  --   --   --   CREATININE 2.03* 2.18*  --   --   --   CALCIUM 7.7* 7.5*  --   --   --   MG  --   --   < > 2.1 2.0  PHOS  --   --   < > 3.4 3.6  < > = values in this interval not displayed.  Lipid Panel:     Component Value Date/Time   CHOL 115 11/24/2016 0514   TRIG 177 (H) 11/24/2016 0514   HDL 21 (L) 11/24/2016 0514   CHOLHDL 5.5 11/24/2016 0514   VLDL 35 11/24/2016 0514   LDLCALC 59 11/24/2016 0514   HgbA1c:  Lab Results  Component Value Date   HGBA1C 8.2 (H) 11/24/2016    PHYSICAL EXAM Elderly Caucasian male who is not in distress. . Afebrile. Head is nontraumatic. Neck is supple without bruit.    Cardiac exam no murmur or gallop. Lungs are clear to auscultation. Distal pulses are well felt. Neurological Exam :   Patient is drowsy but can be aroused and opens eyes partially. Will follow midline and simple commands only. Right gaze preference. Unable to look to the left. Does not blink to threat on either side. Left lower facial weakness. Tongue midline. Dense left hemiplegia with only slight withdrawal to painful stimuli in the left lower extremity and none in the left upper extremity. Purposeful antigravity movements on the right side   ASSESSMENT/PLAN [name] is a 83 y.o. male with history of of AF on eliquis not on AC, HTN, CAD, CM, OSA who woke with L hemiparesis, dysarthria and L neglect. He did not receive IV t-PA due to delay in arrival. CT and MRI showed hyperdense R MCA. Taken to IR where he received TICI3 revascularization following mechanical thrombectomy and rescue stent placement.   Stroke:   R MCA infarct d/t R  MCA M1 occlusion s/p revascularization w/ mechanical thrombectomy and rescue stent placement. Infarct likely due to large vessel disease plus atrial fibrillation. Additional posterior watershed infarct d/t hypoperfusion and L cerebellar infarct.  Code Stroke CT hyperdense R MCA with acute R BG infarct. Aspects 8.  Ltd MRI  Acte R BG and small part frontal lobe infarct.   MRA  Distal R M1 occlusion.  Cerebral angio occluded R MCA M1, Solitaire x 3 passes w/ immediate re-occlusion d/t severe stenosis. Rescue stent placed w/ resultant TICI3 reperfusion  Post IR CT Pipeline stent R MCA w/ infarct & contrast staining R caudate and putamen. Infarct only subinsular.   MRI R MCA, posterior watershed and L cerebellar infarcts. R BG petechial hemorrhage. Old L BG infarct. Moderate small vessel disease.   Carotid doppler no significant extracranial stenosis  2D Echo   pending   LDL 59  HgbA1c 8.2  SCDs for VTE prophylaxis  Diet NPO time specified   No antithrombotic prior to admission, now on aspirin 325 mg daily and clopidogrel 75 mg daily  Therapy recommendations:   CLR  Disposition:  pending   Respiratory Failure  Intubated for neurointervention  CCM following  Atrial Fibrillation  Home anticoagulation:  none   Eliquis stopped 2 months ago d/t GIB              Hypotension Hx Hypertension  BP goal 120-140 post IR  Sedation weaned  Low dose neo adsded  Stable  Diabetes type II  HgbA1c pending, goal < 7.0  Other Stroke Risk Factors  Advanced age  Former Cigarette smoker  UDS / ETOH level not performed   Overweight, Body mass index is 28.03 kg/m.   Coronary artery disease - MI  Obstructive sleep apnea, unable to use CPAP at home  Cardiomyopathy   Other Active Problems  Hx asymptomatic bradycardia  Hypokalemia  CKD  Recent GIB for which eliquis was stopped  Anemia  Hematuria -enlarged prostate and catheter trauma  Hospital day # 3  I have personally examined this patient, reviewed notes, independently viewed imaging studies, participated in medical decision making and plan of care.ROS completed by me personally and pertinent positives fully documented  I have made any additions or clarifications directly to the above note. Agree with note above.  Presented with right MCA occlusion and infarct. Unclear time of onset and underwent mechanical embolectomy but had recurrent right MCA occlusion likely due to underlying intrinsic stenosis hence underwent emergent pipeline stent placement. He remains at risk for neurological worsening, expansion of hemorrhage and recurrent strokes. He does have AFIB but has previously had GI bleed on eliquis and seemingly not be a good long-term anticoagulation candidate. I had a long discussion at the bedside with the patient's wife, about his prognosis, plan for further evaluation, treatment and answered questions.  Continue dual antiplatelet therapy for now due to intracranial stent despite hematuria as risk benefit favors it. Repeat hematocrit and if low may need another  transfusion.D/w Dr Delton Coombes. This patient is critically ill and at significant risk of neurological worsening, death and care requires constant monitoring of vital signs, hemodynamics,respiratory and cardiac monitoring, extensive review of multiple databases, frequent neurological assessment, discussion with family, other specialists and medical decision making of high complexity.I have made any additions or clarifications directly to the above note.This critical care time does not reflect procedure time, or teaching time or supervisory time of PA/NP/Med Resident etc but could involve care discussion time.  I spent 32 minutes of  neurocritical care time  in the care of  this patient.     Delia Heady, MD Medical Director [facility] Stroke Center Pager: [phone] 11/26/2016 3:08 PM   To contact Stroke Continuity provider, please refer to [email or web address]. After hours, contact General Neurology

## 2016-11-26 NOTE — Progress Notes (Signed)
Called Blood Bank to check on blood. Blood bank stated they would call me when the blood is ready.

## 2016-11-26 DEATH — deceased

## 2016-11-27 ENCOUNTER — Inpatient Hospital Stay (HOSPITAL_COMMUNITY): Payer: Medicare Other

## 2016-11-27 DIAGNOSIS — Z09 Encounter for follow-up examination after completed treatment for conditions other than malignant neoplasm: Secondary | ICD-10-CM

## 2016-11-27 LAB — HEMOGLOBIN AND HEMATOCRIT, BLOOD
HEMATOCRIT: 27.6 % — AB (ref 39.0–52.0)
HEMOGLOBIN: 9.1 g/dL — AB (ref 13.0–17.0)

## 2016-11-27 LAB — CBC
HCT: 23.9 % — ABNORMAL LOW (ref 39.0–52.0)
HEMOGLOBIN: 8.1 g/dL — AB (ref 13.0–17.0)
MCH: 30.6 pg (ref 26.0–34.0)
MCHC: 33.9 g/dL (ref 30.0–36.0)
MCV: 90.2 fL (ref 78.0–100.0)
Platelets: 162 10*3/uL (ref 150–400)
RBC: 2.65 MIL/uL — ABNORMAL LOW (ref 4.22–5.81)
RDW: 15.9 % — AB (ref 11.5–15.5)
WBC: 13.4 10*3/uL — ABNORMAL HIGH (ref 4.0–10.5)

## 2016-11-27 LAB — GLUCOSE, CAPILLARY
GLUCOSE-CAPILLARY: 137 mg/dL — AB (ref 65–99)
GLUCOSE-CAPILLARY: 201 mg/dL — AB (ref 65–99)
GLUCOSE-CAPILLARY: 244 mg/dL — AB (ref 65–99)
Glucose-Capillary: 168 mg/dL — ABNORMAL HIGH (ref 65–99)
Glucose-Capillary: 168 mg/dL — ABNORMAL HIGH (ref 65–99)
Glucose-Capillary: 236 mg/dL — ABNORMAL HIGH (ref 65–99)
Glucose-Capillary: 259 mg/dL — ABNORMAL HIGH (ref 65–99)

## 2016-11-27 LAB — PREPARE RBC (CROSSMATCH)

## 2016-11-27 NOTE — Progress Notes (Signed)
4 Days Post-Op Subjective: Patient continues to have to be manually irrigated approx. 5x per shift. Hg went from 5.7 to 8.1 after 3 units.    Objective: Vital signs in last 24 hours: Temp:  [97.5 F (36.4 C)-99.2 F (37.3 C)] 98.3 F (36.8 C) (06/02 0400) Pulse Rate:  [36-91] 68 (06/02 0500) Resp:  [13-29] 25 (06/02 0500) BP: (99-154)/(44-79) 154/62 (06/02 0500) SpO2:  [97 %-100 %] 97 % (06/02 0500) Weight:  [161 lb 13.1 oz (73.4 kg)] 161 lb 13.1 oz (73.4 kg) (06/02 0500)  Intake/Output from previous day: 06/01 0701 - 06/02 0700 In: 88502 [I.V.:2450; Blood:1225] Out: 77412 [Urine:56950] Intake/Output this shift: Total I/O In: -  Out: 3500 [Urine:3500]  Physical Exam:  General:cooperative and no distress GU: uncirc penis with no drainage or skin breakdown; 22 3-way foley in place with CBIs connected; fluid in bag clear after catheter irrigated and placed on tension.  Lab Results:  Recent Labs  11/26/16 1204 11/26/16 2304 11/27/16 0610  HGB 5.7* 7.4* 8.1*  HCT 17.7* 22.7* 23.9*   BMET  Recent Labs  11/25/16 0300  NA 141  K 3.6  CL 113*  CO2 15*  GLUCOSE 196*  BUN 27*  CREATININE 2.18*  CALCIUM 7.5*    Recent Labs  11/24/16 1824  INR 1.11   No results for input(s): LABURIN in the last 72 hours. Results for orders placed or performed during the hospital encounter of 10/28/2016  MRSA PCR Screening     Status: None   Collection Time: 11/21/2016  1:44 PM  Result Value Ref Range Status   MRSA by PCR NEGATIVE NEGATIVE Final    Comment:        The GeneXpert MRSA Assay (FDA approved for NASAL specimens only), is one component of a comprehensive MRSA colonization surveillance program. It is not intended to diagnose MRSA infection nor to guide or monitor treatment for MRSA infections.     Studies/Results: Dg Abd Portable 1v  Result Date: 11/25/2016 CLINICAL DATA:  Nasogastric tube placement. EXAM: PORTABLE ABDOMEN - 1 VIEW COMPARISON:  10/30/2016.  FINDINGS: Nasogastric tube tip in the region of the gastric pylorus. Side hole in the mid to distal stomach. Normal bowel gas pattern. Cholecystectomy clips. Lumbar and lower thoracic spine degenerative changes. IMPRESSION: Feeding tube tip in the region of the gastric pylorus. Electronically Signed   By: Beckie Salts M.D.   On: 11/25/2016 16:51    Assessment/Plan:   1) gross hematuria - problem persists.  Foley placed on tension to help tamponade prostatic bleeding - urine cleared immediately with tension. Venous bleeding is likely source of hematuria which can be very difficult to stop intraoperatively. Spoke with Dr. Pearlean Brownie who plans to hold plavix today. Continue catheter on tension and CBI. Bladder ultrasound to evaluate for any large clot within the bladder. NPO after midnight in the event intervention is needed in the AM. Monitor Hg.  2) BPH- about a 170 g prostate on CT. Continue finasteride  3) Urinary retention - continue foley. Void trial when stable/able.      LOS: 4 days   Cloyde Reams [facility] 11/27/2016, 8:03 AM

## 2016-11-27 NOTE — Progress Notes (Signed)
Hgb 7.8 after transfusion, still with hematuria. Urology planning procedure for today. With continued blood loss and low HGB, will transfuse another unit PRBC.   Ritta Slot, MD [facility] [phone]  If 7pm- 7am, please page neurology on call as listed in AMION.

## 2016-11-27 NOTE — Progress Notes (Signed)
STROKE TEAM PROGRESS NOTE   SUBJECTIVE (INTERVAL HISTORY) Patient `s wife and son in law are at bedside.He continues to have hematuria  He required blood transfusion last night. Hemoglobin is yet 8.1 on the.. I discussed the risk benefits of continuing antiplatelet therapy in the setting of a fresh intracranial stent with   to the patient's wife, Dr. Corliss Frazier as well as urologist Dr. Sherryl Frazier and collectively feel that we need to temporarily hold Plavix and patient may need urological procedure tomorrow to stop bleeding. Patient and family understand the risk of stent occlusion but he has continued to have hematuria with drop in hematocrit over the last 3 days requiring multiple blood transfusions necessitating holding Plavix OBJECTIVE Temp:  [97.5 F (36.4 C)-99.2 F (37.3 C)] 98.3 F (36.8 C) (06/02 0400) Pulse Rate:  [36-91] 68 (06/02 0500) Cardiac Rhythm: Atrial fibrillation (06/02 0400) Resp:  [13-29] 25 (06/02 0500) BP: (99-154)/(44-79) 154/62 (06/02 0500) SpO2:  [97 %-100 %] 97 % (06/02 0500) Weight:  [73.4 kg (161 lb 13.1 oz)] 73.4 kg (161 lb 13.1 oz) (06/02 0500)  CBC:   Recent Labs Lab 11/10/2016 1302 11/24/16 0514  11/26/16 2304 11/27/16 0610  WBC 6.0 8.2  < > 11.5* 13.4*  NEUTROABS 4.3 6.5  --   --   --   HGB 9.6* 9.9*  < > 7.4* 8.1*  HCT 30.6* 30.8*  < > 22.7* 23.9*  MCV 89.7 90.6  < > 92.3 90.2  PLT 151 154  < > 165 162  < > = values in this interval not displayed.  Basic Metabolic Panel:   Recent Labs Lab 11/24/16 0514 11/25/16 0300  11/25/16 1850 11/26/16 0508  NA 138 141  --   --   --   K 4.1 3.6  --   --   --   CL 110 113*  --   --   --   CO2 21* 15*  --   --   --   GLUCOSE 131* 196*  --   --   --   BUN 28* 27*  --   --   --   CREATININE 2.03* 2.18*  --   --   --   CALCIUM 7.7* 7.5*  --   --   --   MG  --   --   < > 2.1 2.0  PHOS  --   --   < > 3.4 3.6  < > = values in this interval not displayed.  Lipid Panel:     Component Value Date/Time   CHOL  115 11/24/2016 0514   TRIG 177 (H) 11/24/2016 0514   HDL 21 (L) 11/24/2016 0514   CHOLHDL 5.5 11/24/2016 0514   VLDL 35 11/24/2016 0514   LDLCALC 59 11/24/2016 0514   HgbA1c:  Lab Results  Component Value Date   HGBA1C 8.2 (H) 11/24/2016    PHYSICAL EXAM Elderly Caucasian male who is not in distress. . Afebrile. Head is nontraumatic. Neck is supple without bruit.    Cardiac exam no murmur or gallop. Lungs are clear to auscultation. Distal pulses are well felt. Neurological Exam :  Patient is drowsy but can be aroused and opens eyes partially. Will follow midline and simple commands only. Right gaze preference. Unable to look to the left. Does not blink to threat on either side. Left lower facial weakness. Tongue midline. Dense left hemiplegia with only slight withdrawal to painful stimuli in the left lower extremity and none in the left upper  extremity. Purposeful antigravity movements on the right side   ASSESSMENT/PLAN [name] is a 83 y.o. male with history of of AF on eliquis not on AC, HTN, CAD, CM, OSA who woke with L hemiparesis, dysarthria and L neglect. He did not receive IV t-PA due to delay in arrival. CT and MRI showed hyperdense R MCA. Taken to IR where he received TICI3 revascularization following mechanical thrombectomy and rescue stent placement.   Stroke:   R MCA infarct d/t R MCA M1 occlusion s/p revascularization w/ mechanical thrombectomy and rescue stent placement. Infarct likely due to large vessel disease plus atrial fibrillation. Additional posterior watershed infarct d/t hypoperfusion and L cerebellar infarct.  Code Stroke CT hyperdense R MCA with acute R BG infarct. Aspects 8.  Ltd MRI  Acte R BG and small part frontal lobe infarct.   MRA  Distal R M1 occlusion.  Cerebral angio occluded R MCA M1, Solitaire x 3 passes w/ immediate re-occlusion d/t severe stenosis. Rescue stent placed w/ resultant TICI3 reperfusion  Post IR CT Pipeline stent R MCA w/  infarct & contrast staining R caudate and putamen. Infarct only subinsular.   MRI R MCA, posterior watershed and L cerebellar infarcts. R BG petechial hemorrhage. Old L BG infarct. Moderate small vessel disease.   Carotid doppler no significant extracranial stenosis  2D Echo   pending   LDL 59  HgbA1c 8.2  SCDs for VTE prophylaxis  Diet NPO time specified   No antithrombotic prior to admission, now on aspirin 325 mg daily and clopidogrel 75 mg daily  Therapy recommendations:  CLR  Disposition:  pending   Respiratory Failure  Intubated for neurointervention  CCM following  Atrial Fibrillation  Home anticoagulation:  none   Eliquis stopped 2 months ago d/t GIB              Hypotension Hx Hypertension  BP goal 120-140 post IR  Sedation weaned  Low dose neo adsded  Stable  Diabetes type II  HgbA1c pending, goal < 7.0  Other Stroke Risk Factors  Advanced age  Former Cigarette smoker  UDS / ETOH level not performed   Overweight, Body mass index is 26.93 kg/m.   Coronary artery disease - MI  Obstructive sleep apnea, unable to use CPAP at home  Cardiomyopathy   Other Active Problems  Hx asymptomatic bradycardia  Hypokalemia  CKD  Recent GIB for which eliquis was stopped  Anemia  Hematuria -enlarged prostate and catheter trauma  Hospital day # 4  I have personally examined this patient, reviewed notes, independently viewed imaging studies, participated in medical decision making and plan of care.ROS completed by me personally and pertinent positives fully documented  I have made any additions or clarifications directly to the above note. Agree with note above.  Presented with right MCA occlusion and infarct. Unclear time of onset and underwent mechanical embolectomy but had recurrent right MCA occlusion likely due to underlying intrinsic stenosis hence underwent emergent pipeline stent placement. He remains at risk for neurological  worsening, expansion of hemorrhage and recurrent strokes. He does have AFIB but has previously had GI bleed on eliquis  And now hematuria on dual antiplatelet therapyand seemingly not be a good long-term anticoagulation candidate. I had a long discussion at the bedside with the patient's wife and son in law, about his prognosis, plan for further evaluation, treatment and answered questions.  Hold Plavix for a few days. Continue aspirin 325 mg. Urology team following his hematuria and may need  surgery tomorrow This patient is critically ill and at significant risk of neurological worsening, death and care requires constant monitoring of vital signs, hemodynamics,respiratory and cardiac monitoring, extensive review of multiple databases, frequent neurological assessment, discussion with family, other specialists and medical decision making of high complexity.I have made any additions or clarifications directly to the above note.This critical care time does not reflect procedure time, or teaching time or supervisory time of PA/NP/Med Resident etc but could involve care discussion time.  I spent 45 minutes of neurocritical care time  in the care of  this patient.    Delia Heady, MD Medical Director [facility] Stroke Center Pager: [phone] 11/27/2016 12:39 PM   To contact Stroke Continuity provider, please refer to [email or web address]. After hours, contact General Neurology

## 2016-11-27 NOTE — Progress Notes (Signed)
Patient ID: [name], male   DOB: [birth year], 83 y.o.   MRN: [id number] INR. Patients progress discussed with Dr Pearlean Brownie. Given the  Patients dropping hemoglobin needing transfusions from probable prostatic venous source, plavix could be  held temporarily. Would leave patient on aspirin.325 mg per day ,and resume it after the surgery. S.Autum Benfer MD

## 2016-11-28 ENCOUNTER — Inpatient Hospital Stay (HOSPITAL_COMMUNITY): Payer: Medicare Other

## 2016-11-28 DIAGNOSIS — E1159 Type 2 diabetes mellitus with other circulatory complications: Secondary | ICD-10-CM

## 2016-11-28 DIAGNOSIS — I482 Chronic atrial fibrillation: Secondary | ICD-10-CM

## 2016-11-28 LAB — BPAM RBC
BLOOD PRODUCT EXPIRATION DATE: 201806132359
Blood Product Expiration Date: 201806122359
Blood Product Expiration Date: 201806132359
ISSUE DATE / TIME: 201806011945
ISSUE DATE / TIME: 201806020057
ISSUE DATE / TIME: 201806011536
UNIT TYPE AND RH: 6200
UNIT TYPE AND RH: 6200
UNIT TYPE AND RH: 6200

## 2016-11-28 LAB — TYPE AND SCREEN
ABO/RH(D): A POS
Antibody Screen: NEGATIVE
Unit division: 0
Unit division: 0
Unit division: 0

## 2016-11-28 LAB — CBC
HCT: 25.7 % — ABNORMAL LOW (ref 39.0–52.0)
HEMOGLOBIN: 8.3 g/dL — AB (ref 13.0–17.0)
MCH: 30.2 pg (ref 26.0–34.0)
MCHC: 32.3 g/dL (ref 30.0–36.0)
MCV: 93.5 fL (ref 78.0–100.0)
Platelets: 171 10*3/uL (ref 150–400)
RBC: 2.75 MIL/uL — ABNORMAL LOW (ref 4.22–5.81)
RDW: 16.4 % — AB (ref 11.5–15.5)
WBC: 9.9 10*3/uL (ref 4.0–10.5)

## 2016-11-28 LAB — BASIC METABOLIC PANEL
ANION GAP: 9 (ref 5–15)
BUN: 41 mg/dL — ABNORMAL HIGH (ref 6–20)
CALCIUM: 7.7 mg/dL — AB (ref 8.9–10.3)
CHLORIDE: 122 mmol/L — AB (ref 101–111)
CO2: 18 mmol/L — ABNORMAL LOW (ref 22–32)
Creatinine, Ser: 2.01 mg/dL — ABNORMAL HIGH (ref 0.61–1.24)
GFR calc non Af Amer: 28 mL/min — ABNORMAL LOW (ref >60)
GFR, EST AFRICAN AMERICAN: 33 mL/min — AB (ref >60)
Glucose, Bld: 154 mg/dL — ABNORMAL HIGH (ref 65–99)
Potassium: 2.9 mmol/L — ABNORMAL LOW (ref 3.5–5.1)
SODIUM: 149 mmol/L — AB (ref 135–145)

## 2016-11-28 LAB — GLUCOSE, CAPILLARY
GLUCOSE-CAPILLARY: 160 mg/dL — AB (ref 65–99)
GLUCOSE-CAPILLARY: 172 mg/dL — AB (ref 65–99)
GLUCOSE-CAPILLARY: 87 mg/dL (ref 65–99)
Glucose-Capillary: 183 mg/dL — ABNORMAL HIGH (ref 65–99)
Glucose-Capillary: 191 mg/dL — ABNORMAL HIGH (ref 65–99)
Glucose-Capillary: 167 mg/dL — ABNORMAL HIGH (ref 65–99)

## 2016-11-28 LAB — HEMOGLOBIN: Hemoglobin: 8.2 g/dL — ABNORMAL LOW (ref 13.0–17.0)

## 2016-11-28 MED ORDER — POTASSIUM CHLORIDE 20 MEQ/15ML (10%) PO SOLN
40.0000 meq | ORAL | Status: AC
  Administered 2016-11-28 (×3): 40 meq via ORAL
  Filled 2016-11-28 (×3): qty 30

## 2016-11-28 NOTE — Progress Notes (Signed)
5 Days Post-Op Subjective: Plavix held yesterday. Received another unit yesterday. Hg rose to 9.1. It is 8.3 this AM. Bladder u/s showed some residual clot in bladder but no massive clot burden.  CBI running clear on slow drip currently. Nursing staff had to irrigate clots overnight.   Objective: Vital signs in last 24 hours: Temp:  [97.8 F (36.6 C)-98.8 F (37.1 C)] 98.4 F (36.9 C) (06/03 0810) Pulse Rate:  [50-86] 86 (06/03 0800) Resp:  [15-27] 22 (06/03 0800) BP: (124-163)/(50-91) 141/58 (06/03 0800) SpO2:  [96 %-100 %] 99 % (06/03 0800) Weight:  [170 lb 6.7 oz (77.3 kg)] 170 lb 6.7 oz (77.3 kg) (06/03 0600)  Intake/Output from previous day: 06/02 0701 - 06/03 0700 In: 14824.2 [I.V.:1800; NG/GT:1024.2] Out: 96045 [Urine:55750] Intake/Output this shift: Total I/O In: 3150 [I.V.:150; Other:3000] Out: 4950 [Urine:4950]  Physical Exam:  General:cooperative and no distress GU: uncirc penis with no drainage or skin breakdown; 22 3-way foley in place with CBIs connected; currently completely clear on slow drip. Foley is off tension.  Lab Results:  Recent Labs  11/27/16 0610 11/27/16 1814 11/28/16 0629  HGB 8.1* 9.1* 8.3*  HCT 23.9* 27.6* 25.7*   BMET  Recent Labs  11/28/16 0629  NA 149*  K 2.9*  CL 122*  CO2 18*  GLUCOSE 154*  BUN 41*  CREATININE 2.01*  CALCIUM 7.7*   No results for input(s): LABPT, INR in the last 72 hours. No results for input(s): LABURIN in the last 72 hours. Results for orders placed or performed during the hospital encounter of 11/06/2016  MRSA PCR Screening     Status: None   Collection Time: 11/02/2016  1:44 PM  Result Value Ref Range Status   MRSA by PCR NEGATIVE NEGATIVE Final    Comment:        The GeneXpert MRSA Assay (FDA approved for NASAL specimens only), is one component of a comprehensive MRSA colonization surveillance program. It is not intended to diagnose MRSA infection nor to guide or monitor treatment for MRSA  infections.     Studies/Results: US Pelvis Limited  Result Date: 11/27/2016 CLINICAL DATA:  Gross hematuria, indwelling Foley catheter, history of recent stroke intervention of the right MCA. EXAM: LIMITED ULTRASOUND OF PELVIS TECHNIQUE: Limited transabdominal ultrasound examination of the pelvis was performed. COMPARISON:  05/18/2016 CT without contrast FINDINGS: Indwelling Foley catheter within the bladder. Bladder is decompressed but the wall does appear thickened. Heterogeneous complex echogenic abnormality within the bladder lumen measures 4.6 x 4 x 5.0 cm consistent with bladder mass or intraluminal clot. This remains nonspecific by ultrasound. IMPRESSION: Ill-defined intraluminal echogenic masslike abnormality compatible with bladder mass or clot. Electronically Signed   By: Judie Petit.  Shick M.D.   On: 11/27/2016 11:09    Assessment/Plan:   1) gross hematuria - problem seems to be improving after placing foley on tension yesterday for a few hours and holding plavix. Did have slight drop in Hg overnight. Does have some residual clot in bladder that intermittently clogs catheter but should break down over time. CBI drip completely clear on slow drip which is a significant improvement. Discussed cysto/clot evac/fulguration of bleeders vs continued observation with CBI with son in law and wife. Discussed risks/benefits of both. Since his urine is dramatically better this morning with minimal CBI, the family would like to wait another day to try to avoid surgery.   2) BPH- about a 170 g prostate on CT. Continue finasteride  3) Urinary retention - continue foley. Void trial  when stable/able.      LOS: 5 days   Hildred Laser 11/28/2016, 8:43 AM

## 2016-11-28 NOTE — Progress Notes (Signed)
  Speech Language Pathology Treatment: Dysphagia  Patient Details Name: [name] MRN: [id number] DOB: 1927/11/07 Today's Date: 11/28/2016 Time: 4193-7902 SLP Time Calculation (min) (ACUTE ONLY): 22 min  Assessment / Plan / Recommendation Clinical Impression  ST follow up for swallowing therapy.  Oral care was provided prior to any PO's and the patient's tongue was noted to be dry with dried secretions on it.   Patient had FEES study on Friday suggesting the use of ice chips if the patient was alert enough.  The patient was given ice chips and encouraged to complete a hard swallow.  Decreased bolus awareness was observed as the patient required cues to swallow each ice chip presented. This was most likely impacted slightly be decreased alertness.  He was asked to cough and reswallow following each ice chip swallow.  The patient completed approximately 10 hard ice chip swallows followed by a cough and hard dry swallow.  The dry swallow was more difficult to initiate.  The patient is eager for PO intake.  Cough x 1 ice chip was noted and vocal quality remained clear.  Education was provided to his son in law regarding continued oral care to facilitate oral moisture and decreased bacteria as well as the use of ice chips with the nursing staff (~5/hour with aggressive oral care).   ST will continue to follow for swallowing therapy.    HPI HPI: Pt is an 83 year old male with cardiac history who presented with acute onset of left-sided weakness and slurred speech. He was not a tpa candidate, but was taken to IR for endovascular thrombectomy on 5/29 and remained intubated until 5/30. MRI on 5/30 revealed acute multifocal RIGHT MCA territory and posterior watershed infarcts, an acute subcentimeter LEFT cerebellar infarct, and RIGHT basal ganglia petechial hemorrhage without lobar hematoma.      SLP Plan  Continue with current plan of care       Recommendations  Diet recommendations: NPO (except a few  ice chips per hour with nursing only) Medication Administration: Via alternative means                Oral Care Recommendations: Oral care QID Follow up Recommendations: Inpatient Rehab SLP Visit Diagnosis: Dysphagia, oropharyngeal phase (R13.12) Plan: Continue with current plan of care       GO               Dimas Aguas, MA, CCC-SLP Acute Rehab SLP [phone] Fleet Contras 11/28/2016, 11:27 AM

## 2016-11-28 NOTE — Progress Notes (Signed)
STROKE TEAM PROGRESS NOTE   SUBJECTIVE (INTERVAL HISTORY) No family at bedside. Pt neuro stable. However, Hb from 9.1 post transfusion down to 8.3 this am. plavix on hold. Urology on board, will hold of surgery today and continue observe. Hold off TF for now. CT repeat stable infarct with hemorrhagic transformation.   OBJECTIVE Temp:  [98 F (36.7 C)-98.8 F (37.1 C)] 98 F (36.7 C) (06/03 1125) Pulse Rate:  [54-120] 79 (06/03 1315) Cardiac Rhythm: Atrial fibrillation (06/03 0800) Resp:  [15-27] 20 (06/03 1315) BP: (124-181)/(50-81) 157/64 (06/03 1315) SpO2:  [96 %-100 %] 98 % (06/03 1315) Weight:  [77.3 kg (170 lb 6.7 oz)] 77.3 kg (170 lb 6.7 oz) (06/03 0600)  CBC:   Recent Labs Lab 2016/11/25 1302 11/24/16 0514  11/27/16 0610 11/27/16 1814 11/28/16 0629  WBC 6.0 8.2  < > 13.4*  --  9.9  NEUTROABS 4.3 6.5  --   --   --   --   HGB 9.6* 9.9*  < > 8.1* 9.1* 8.3*  HCT 30.6* 30.8*  < > 23.9* 27.6* 25.7*  MCV 89.7 90.6  < > 90.2  --  93.5  PLT 151 154  < > 162  --  171  < > = values in this interval not displayed.  Basic Metabolic Panel:   Recent Labs Lab 11/25/16 0300  11/25/16 1850 11/26/16 0508 11/28/16 0629  NA 141  --   --   --  149*  K 3.6  --   --   --  2.9*  CL 113*  --   --   --  122*  CO2 15*  --   --   --  18*  GLUCOSE 196*  --   --   --  154*  BUN 27*  --   --   --  41*  CREATININE 2.18*  --   --   --  2.01*  CALCIUM 7.5*  --   --   --  7.7*  MG  --   < > 2.1 2.0  --   PHOS  --   < > 3.4 3.6  --   < > = values in this interval not displayed.  Lipid Panel:     Component Value Date/Time   CHOL 115 11/24/2016 0514   TRIG 177 (H) 11/24/2016 0514   HDL 21 (L) 11/24/2016 0514   CHOLHDL 5.5 11/24/2016 0514   VLDL 35 11/24/2016 0514   LDLCALC 59 11/24/2016 0514   HgbA1c:  Lab Results  Component Value Date   HGBA1C 8.2 (H) 11/24/2016   Imaging I have personally reviewed the radiological images below and agree with the radiology interpretations.  Ct  Head Wo Contrast 25-Nov-2016 Pipeline stent placement in the right MCA. Contrast staining in regions of infarction affecting the right caudate and putamen. Low-density/infarction also evident in the sub insular brain without contrast staining. No evidence of new infarction. No significant mass effect. No demonstrable hemorrhage, though petechial bleeding could be hidden within the contrast staining.   [name] Head Wo Contrast 25-Nov-2016 1. Acute infarct involving the right basal ganglia and small portion of the anteroinferior frontal lobe.  2. Distal right M1 MCA occlusion.  3. 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   Mr Brain Wo Contrast 11/24/2016 Acute multifocal RIGHT MCA territory and posterior watershed infarcts, further propagation from prior MRI.  Acute subcentimeter LEFT cerebellar infarct.  RIGHT basal ganglia petechial hemorrhage without lobar  hematoma.  Old hemorrhagic LEFT basal ganglia infarct and moderate to severe chronic small vessel ischemic disease.   DSA 11/25/2016 Status post endovascular complete revascularization of occluded right middle cerebral artery M1 segment with 3 passes of a 4 mm x 40 mm Solitaire FR retrieval device achieving a TICI 3 reperfusion with immediate occlusion of the vessel due to underlying severe segmental arteriosclerotic disease. Status post rescue stenting right middle cerebral artery due to recurrent occlusion despite retrieval thrombectomy attempts as described above, achieving a TICI 3 reperfusion.   Ct Head Code Stroke W/o Cm 11/14/2016 1. Hyperdense distal right M1/ MCA bifurcation with evidence of acute infarction in the right basal ganglia.  2. ASPECTS is at best 8.  3. No acute intracranial hemorrhage.   Transthoracic Echocardiogram 11/25/2016 - Left ventricle: The cavity size was normal. Wall thickness was   increased in a pattern of moderate LVH. Systolic function was   normal. The  estimated ejection fraction was in the range of 50%   to 55%. Images were inadequate for LV wall motion assessment. The   study is not technically sufficient to allow evaluation of LV   diastolic function. - Mitral valve: Mildly thickened leaflets . There was trivial   regurgitation. Valve area by pressure half-time: 1.25 cm^2. - Left atrium: Severely dilated. - Right atrium: The atrium was mildly dilated. - Tricuspid valve: There was moderate regurgitation. - Pulmonary arteries: PA peak pressure: 53 mm Hg (S). - Inferior vena cava: The vessel was normal in size. The   respirophasic diameter changes were in the normal range (>= 50%),   consistent with normal central venous pressure. Impressions: - Compared to a prior study in 2017, the LVEF is lower at 50-55%,   moderate LVH, a-fib is present. There is severe LAE and moderate   RAE, moderate TR, RVSP 53 mmHg, normal IVC size.  CUS 11/24/2016 The vertebral arteries appear patent with antegrade flow. - Findings consistent with a 1-39 percent stenosis involving the   right internal carotid artery and the left internal carotid   artery.  Ct Head Wo Contrast 11/28/2016 IMPRESSION: 1. Stable compared to brain MRI 11/24/2016. 2. Extensive right MCA territory infarction with mild hemorrhagic conversion (11 mm hematoma present in the putamen).    PHYSICAL EXAM  Temp:  [98 F (36.7 C)-98.8 F (37.1 C)] 98 F (36.7 C) (06/03 1125) Pulse Rate:  [54-120] 79 (06/03 1315) Resp:  [15-27] 20 (06/03 1315) BP: (124-181)/(50-81) 157/64 (06/03 1315) SpO2:  [96 %-100 %] 98 % (06/03 1315) Weight:  [77.3 kg (170 lb 6.7 oz)] 77.3 kg (170 lb 6.7 oz) (06/03 0600)  General - Well nourished, well developed, sleepy.  Ophthalmologic - Fundi not visualized due to noncooperation.  Cardiovascular - irregularly irregular heart rate and rhythm.  Mental Status -  Level of arousal and orientation to time, place, and person were intact. Language including  expression, naming, repetition, comprehension was assessed and found intact. Mild dysarthria.   Cranial Nerves II - XII - II - Visual field intact OU. III, IV, VI - right gaze preference but able to cross midline. V - Facial sensation intact bilaterally. VII - left facial droop. VIII - Hearing & vestibular intact bilaterally. X - Palate elevates symmetrically, mild dysarthria XI - Chin turning & shoulder shrug intact bilaterally. XII - Tongue protrusion intact.  Motor Strength - The patient's strength was normal in RUE and RLE, but 0/5 LUE and 2/5 LLE.  Bulk was normal and fasciculations were absent.  Motor Tone - Muscle tone was assessed at the neck and appendages and was increase at LUE.  Reflexes - The patient's reflexes were 1+ in all extremities and he had no pathological reflexes.  Sensory - Light touch, temperature/pinprick were assessed and were symmetrical.    Coordination - not cooperative on exam.  Tremor was absent.  Gait and Station - not tested.   ASSESSMENT/PLAN [name] is a 84 y.o. male with history of of AF not on AC due to recent GIB, HTN, CAD, OSA who woke with L hemiparesis, dysarthria and L neglect. He did not receive IV t-PA due to delay in arrival. CT and MRI showed hyperdense R MCA. Taken to IR where he received TICI3 revascularization following mechanical thrombectomy and rescue stent placement.   Stroke:   R MCA infarct with right M1 occlusion s/p TICI3 mechanical thrombectomy and rescue stent placement. Infarct likely due to afib not on AC. Additional posterior watershed infarct and L cerebellar infarct.  Code Stroke CT hyperdense R MCA with acute R BG infarct. Aspects 8.  Ltd MRI  Acte R BG and small part frontal lobe infarct.   MRA  Distal R M1 occlusion.  DSA occluded R MCA M1s/p rescue stent placed w/ resultant TICI3 reperfusion  MRI post IR - R MCA, posterior watershed and L cerebellar infarcts. R BG petechial hemorrhage. Old L BG  infarct. Moderate small vessel disease.   Carotid doppler no significant extracranial stenosis  2D Echo - EF 50-55% with atrial fibrillation noted.   LDL 59  HgbA1c 8.2  SCDs for VTE prophylaxis  Diet NPO time specified   No antithrombotic prior to admission, now on aspirin 325 mg daily and clopidogrel on hold.  Therapy recommendations:  SNF  Disposition:  pending   Hematuria   Urology on board, appreciate recs  Improving, s/p foley  Surgery on hold  plavix on hold, on ASA  S/p PRBC  Hb Q12h  Atrial Fibrillation  Home anticoagulation:  none   Eliquis stopped 2 months ago d/t GIB  On ASA now, plavix on hold  Not candidate for [facility] currently due to large infarct.               Hypertension  BP goal normotensive  Stable  Avoid hypotension  Diabetes type II  HgbA1c 8.2, goal < 7.0  Uncontrolled  SSI  Other Stroke Risk Factors  Advanced age  Former Cigarette smoker  UDS / ETOH level not performed   Overweight, Body mass index is 28.36 kg/m.   Coronary artery disease - MI  Obstructive sleep apnea, unable to use CPAP at home  Cardiomyopathy   Other Active Problems  Hx asymptomatic bradycardia  Hypokalemia (lasix 80 mg daily) - 3.6 -> 2.9 -> supplemented - recheck in AM  CKD stage III - Cre 2.18->2.01  Anemia due to acute blood loss - 9.1->8.3, CBC Q12h  Hospital day # 5  This patient is critically ill due to right MCA infarct s/p IR and MCA stent, hematuria requiring PRBC transfusion, afib, anemia and at significant risk of neurological worsening, death form recurrent stroke, hemorrhagic transformation, shock, heart failure. This patient's care requires constant monitoring of vital signs, hemodynamics, respiratory and cardiac monitoring, review of multiple databases, neurological assessment, discussion with family, other specialists and medical decision making of high complexity. I spent 40 minutes of neurocritical care time in the  care of this patient.   Marvel Plan, MD PhD Stroke Neurology 11/28/2016 8:36 PM  To contact Stroke Continuity provider, please refer to [email or web address] After hours, contact General Neurology

## 2016-11-29 DIAGNOSIS — I638 Other cerebral infarction: Secondary | ICD-10-CM

## 2016-11-29 DIAGNOSIS — E870 Hyperosmolality and hypernatremia: Secondary | ICD-10-CM

## 2016-11-29 DIAGNOSIS — E860 Dehydration: Secondary | ICD-10-CM

## 2016-11-29 LAB — GLUCOSE, CAPILLARY
GLUCOSE-CAPILLARY: 146 mg/dL — AB (ref 65–99)
GLUCOSE-CAPILLARY: 173 mg/dL — AB (ref 65–99)
GLUCOSE-CAPILLARY: 252 mg/dL — AB (ref 65–99)
Glucose-Capillary: 201 mg/dL — ABNORMAL HIGH (ref 65–99)
Glucose-Capillary: 181 mg/dL — ABNORMAL HIGH (ref 65–99)
Glucose-Capillary: 195 mg/dL — ABNORMAL HIGH (ref 65–99)

## 2016-11-29 LAB — CBC
HCT: 24.4 % — ABNORMAL LOW (ref 39.0–52.0)
Hemoglobin: 7.9 g/dL — ABNORMAL LOW (ref 13.0–17.0)
MCH: 31 pg (ref 26.0–34.0)
MCHC: 32.4 g/dL (ref 30.0–36.0)
MCV: 95.7 fL (ref 78.0–100.0)
PLATELETS: 180 10*3/uL (ref 150–400)
RBC: 2.55 MIL/uL — AB (ref 4.22–5.81)
RDW: 17.1 % — AB (ref 11.5–15.5)
WBC: 8.8 10*3/uL (ref 4.0–10.5)

## 2016-11-29 LAB — BASIC METABOLIC PANEL
ANION GAP: 11 (ref 5–15)
BUN: 36 mg/dL — ABNORMAL HIGH (ref 6–20)
CALCIUM: 8 mg/dL — AB (ref 8.9–10.3)
CO2: 19 mmol/L — ABNORMAL LOW (ref 22–32)
Chloride: 121 mmol/L — ABNORMAL HIGH (ref 101–111)
Creatinine, Ser: 2.23 mg/dL — ABNORMAL HIGH (ref 0.61–1.24)
GFR, EST AFRICAN AMERICAN: 29 mL/min — AB (ref >60)
GFR, EST NON AFRICAN AMERICAN: 25 mL/min — AB (ref >60)
Glucose, Bld: 190 mg/dL — ABNORMAL HIGH (ref 65–99)
Potassium: 3.4 mmol/L — ABNORMAL LOW (ref 3.5–5.1)
Sodium: 151 mmol/L — ABNORMAL HIGH (ref 135–145)

## 2016-11-29 LAB — HEMOGLOBIN: HEMOGLOBIN: 7.6 g/dL — AB (ref 13.0–17.0)

## 2016-11-29 MED ORDER — FREE WATER
180.0000 mL | Freq: Four times a day (QID) | Status: DC
  Administered 2016-11-29 – 2016-11-30 (×5): 180 mL

## 2016-11-29 MED ORDER — PRO-STAT SUGAR FREE PO LIQD
30.0000 mL | Freq: Every day | ORAL | Status: DC
  Administered 2016-11-30: 30 mL
  Filled 2016-11-29: qty 30

## 2016-11-29 MED ORDER — GLUCERNA 1.2 CAL PO LIQD
1000.0000 mL | ORAL | Status: DC
  Administered 2016-11-29 – 2016-11-30 (×2): 1000 mL
  Filled 2016-11-29 (×2): qty 1000

## 2016-11-29 MED ORDER — BETHANECHOL CHLORIDE 10 MG PO TABS
10.0000 mg | ORAL_TABLET | Freq: Four times a day (QID) | ORAL | Status: DC
  Administered 2016-11-29 – 2016-11-30 (×4): 10 mg via ORAL
  Filled 2016-11-29 (×4): qty 1

## 2016-11-29 MED ORDER — JEVITY 1.2 CAL PO LIQD
1000.0000 mL | ORAL | Status: DC
  Filled 2016-11-29: qty 1000

## 2016-11-29 NOTE — Progress Notes (Signed)
  Speech Language Pathology Treatment: Dysphagia;Cognitive-Linquistic  Patient Details Name: [name] MRN: [id number] DOB: [birth year] Today's Date: 11/29/2016 Time: 9381-8299 SLP Time Calculation (min) (ACUTE ONLY): 21 min  Assessment / Plan / Recommendation Clinical Impression  Pt is alert and oriented x4. He follows commands well and shows adequate sustained attention to basic self-care tasks. Min cues were provided for thoroughness during oral care. He is however impulsive and very fixated on his desire for POs with reduced intellectual awareness about swallowing difficulties. Max cues were provided for recall of swallowing test and its results, but only Min cues were provided for recall of swallowing therapy on previous date when he was reportedly more alert. Pt consumed ice chip trials and small amounts of water by spoon with Mod cues provided for hard cough and re-swallow. Anterior spillage was noted with thin liquids only, due in part to reduced labial seal during acceptance. He did his volitional cough post-swallow but with no additional coughing observed. Recommend to proceed with repeat FEES, scheduled for this afternoon, to assess for improvements in oropharyngeal function.   HPI HPI: Pt is an 83 year old male with cardiac history who presented with acute onset of left-sided weakness and slurred speech. He was not a tpa candidate, but was taken to IR for endovascular thrombectomy on 5/29 and remained intubated until 5/30. MRI on 5/30 revealed acute multifocal RIGHT MCA territory and posterior watershed infarcts, an acute subcentimeter LEFT cerebellar infarct, and RIGHT basal ganglia petechial hemorrhage without lobar hematoma.      SLP Plan  Other (Comment) (FEES)       Recommendations  Diet recommendations: NPO;Other(comment) (ice chips) Medication Administration: Via alternative means                Oral Care Recommendations: Oral care QID;Oral care prior to ice  chip/H20 Follow up Recommendations: Inpatient Rehab SLP Visit Diagnosis: Dysphagia, oropharyngeal phase (R13.12);Cognitive communication deficit (R41.841) Plan: Other (Comment) (FEES)       GO                [name] 11/29/2016, 10:34 AM  [name], M.A. CCC-SLP [phone]

## 2016-11-29 NOTE — Consult Note (Signed)
Physical Medicine and Rehabilitation Consult Reason for Consult: Left side weakness with facial droop and dysarthria Referring Physician: Dr.Xu   HPI: [name] is a 83 y.o. right handed male with history of BPH, cardiomyopathy, hypertension, diabetes mellitus. Per chart review patient lives with spouse and furniture walker prior to admission. Presented 11/29/2016 with left-sided weakness and facial droop and dysarthria. CT/MRI reviewed, showing right basal ganglia and frontal lobe infarct. Per report, acute infarct involving the right basal ganglia and a small portion of the anterior inferior frontal lobe. Distal right M1 MCA occlusion. 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 Patient did not receive TPA. Underwent revascularization mechanical thrombectomy per interventional radiology and stent placement. Repeat CT scan with hemorrhagic transformation. Echocardiogram with ejection fraction of 55% normal systolic function. Currently on aspirin for CVA prophylaxis. NPO with alternative means of nutritional support. Urology consulted for BPH with urinary retention/hematuria and a Foley tube was placed. No current plan for cystoscopy at this time and currently remains on postop as well as Flomax. Physical therapy evaluations completed with recommendations of physical medicine and rehabilitation consult.  Review of Systems  Unable to perform ROS: Mental acuity   Past Medical History:  Diagnosis Date  . Anemia   . BPH (benign prostatic hypertrophy)   . Cardiomyopathy (HCC)   . Coronary artery disease   . Diverticulosis   . Essential hypertension, benign   . GI bleed 2015  . History of kidney stones   . Hypersomnia with sleep apnea, unspecified   . MI (myocardial infarction) (HCC) 1996  . Occasional tremors   . Other and unspecified hyperlipidemia   . Pure hypercholesterolemia   . Sleep apnea    unable to use C-PAP  . Type II or  unspecified type diabetes mellitus without mention of complication, uncontrolled    Past Surgical History:  Procedure Laterality Date  . CARDIAC CATHETERIZATION    . CHOLECYSTECTOMY  1994  . CORONARY ANGIOPLASTY  1996   [place]  . CYSTOSCOPY W/ URETERAL STENT PLACEMENT Left 05/31/2016   Procedure: CYSTOSCOPY WITH STENT REPLACEMENT;  Surgeon: [name], MD;  Location: [facility] ORS;  Service: Urology;  Laterality: Left;  . CYSTOSCOPY WITH LITHOLAPAXY N/A 05/11/2016   Procedure: CYSTOSCOPY WITH LITHOLAPAXY;  Surgeon: [name], MD;  Location: [facility];  Service: Urology;  Laterality: N/A;  . CYSTOSCOPY/URETEROSCOPY/HOLMIUM LASER/STENT PLACEMENT Left 05/11/2016   Procedure: CYSTOSCOPY/URETEROSCOPY/HOLMIUM LASER/STENT PLACEMENT;  Surgeon: [name], MD;  Location: [facility];  Service: Urology;  Laterality: Left;  . ESOPHAGOGASTRODUODENOSCOPY (EGD) WITH PROPOFOL Left 11/23/2015   Procedure: ESOPHAGOGASTRODUODENOSCOPY (EGD) WITH PROPOFOL;  Surgeon: [name], MD;  Location: [facility] ENDOSCOPY;  Service: Endoscopy;  Laterality: Left;  . EYE SURGERY Bilateral    Cataract Extraction with IOL   . FLEXIBLE SIGMOIDOSCOPY    . HERNIA REPAIR Left    Inguinal Hernia Repair  . inguinal hernia repair     left inguinal   . INSERTION OF SUPRAPUBIC CATHETER N/A 05/31/2016   Procedure: INSERTION OF SUPRAPUBIC CATHETER;  Surgeon: [name], MD;  Location: [facility];  Service: Urology;  Laterality: N/A;  . IR GENERIC HISTORICAL  04/12/2016   IR NEPHROSTOMY PLACEMENT LEFT 04/12/2016 [name], MD [facility]-INTERV RAD  . IR PERCUTANEOUS ART THROMBECTOMY/INFUSION INTRACRANIAL INC DIAG ANGIO  29-Nov-2016  . KNEE ARTHROSCOPY Left   . NEPHROSTOMY  05/11/2016   Procedure: NEPHROSTOMY tude removal;  Surgeon: [name], MD;  Location: [facility] OR[facility];  Service:  Urology;;  . RADIOLOGY WITH ANESTHESIA N/A 11-24-16   Procedure: RADIOLOGY WITH ANESTHESIA;  Surgeon: Julieanne Cotton, MD;  Location: [facility] OR;   Service: Radiology;  Laterality: N/A;  . TONSILLECTOMY    . URETEROSCOPY  05/31/2016   Procedure: URETEROSCOPY;  Surgeon: [name], MD;  Location: [facility] OR[facility];  Service: Urology;;   Family History  Problem Relation Age of Onset  . Rheum arthritis Mother   . Alzheimer's disease Father    Social History:  reports that he has quit smoking. His smoking use included Cigarettes. He has a 50.00 pack-year smoking history. He has never used smokeless tobacco. He reports that he does not drink alcohol or use drugs. Allergies:  Allergies  Allergen Reactions  . Exforge [Amlodipine Besylate-Valsartan] Swelling and Other (See Comments)    Pt states that he is unable to swallow.    . Lisinopril Swelling and Other (See Comments)    Pt states that he is unable to swallow.    . Niacin And Related Swelling and Other (See Comments)    Pt states that he is unable to swallow.     Medications Prior to Admission  Medication Sig Dispense Refill  . Coenzyme Q10 (CO Q 10) 10 MG CAPS Take 1 tablet by mouth daily.     Marland Kitchen docusate sodium (COLACE) 100 MG capsule Take 1 capsule (100 mg total) by mouth 2 (two) times daily as needed (take to keep stool soft.). 60 capsule 0  . ferrous sulfate 325 (65 FE) MG tablet Take 325 mg by mouth daily with breakfast.    . finasteride (PROSCAR) 5 MG tablet Take 1 tablet (5 mg total) by mouth daily. 90 tablet 3  . furosemide (LASIX) 40 MG tablet Take 40-80 mg by mouth 2 (two) times daily. Take 2 tablets every am & 1 tablet every pm.     . glimepiride (AMARYL) 1 MG tablet Take 1 mg by mouth daily with breakfast.     . HUMALOG MIX 75/25 KWIKPEN (75-25) 100 UNIT/ML Kwikpen Inject 16 Units into the skin daily.   0  . HYDROcodone-acetaminophen (NORCO/VICODIN) 5-325 MG tablet Take 1-2 tablets by mouth every 6 (six) hours as needed for moderate pain.    Marland Kitchen losartan (COZAAR) 100 MG tablet Take 100 mg by mouth daily.  0  . Magnesium 250 MG TABS Take 250 mg by mouth daily.     . metolazone  (ZAROXOLYN) 5 MG tablet Take 5 mg by mouth daily as needed (for fluid). Reported on 12/15/2015    . oxyCODONE (OXY IR/ROXICODONE) 5 MG immediate release tablet Take 5 mg by mouth every 4 (four) hours as needed for severe pain.    . potassium chloride SA (K-DUR,KLOR-CON) 20 MEQ tablet Take 20 mEq by mouth daily.    . tamsulosin (FLOMAX) 0.4 MG CAPS capsule Take 1 capsule (0.4 mg total) by mouth daily. 90 capsule 3    Home: Home Living Family/patient expects to be discharged to:: Private residence Living Arrangements: Spouse/significant other Available Help at Discharge: Family Type of Home: House Home Access: Stairs to enter Secretary/administrator of Steps: 4 Entrance Stairs-Rails: Right Home Layout: One level Home Equipment: Environmental consultant - 2 wheels  Functional History: Prior Function Level of Independence: Independent Comments: Pt. does not use AD at baseline however, wife mentions that pt. ambulates primarily within home and reaches for furniture or walls for UE support with mobility  Functional Status:  Mobility: Bed Mobility Overal bed mobility: Needs Assistance Bed Mobility: Supine to Sit Rolling:  Max assist Supine to sit: Max assist, HOB elevated Sit to supine: Total assist, +2 for physical assistance General bed mobility comments: Assist to bring LLE off bed and to elevate trunk.  Transfers Overall transfer level: Needs assistance Equipment used:  (back of chair) Transfers: Sit to/from Stand, Stand Pivot Transfers Sit to Stand: Mod assist, +2 physical assistance Stand pivot transfers: Max assist, +2 physical assistance General transfer comment: Assist to power to standing with therapist stabilizing left knee. Pulling up on chair. Manual cues for hip ext and upright posture. Pt leaning left. SPT to chair towards right with chuck pad.      ADL: ADL Overall ADL's : Needs assistance/impaired Eating/Feeding: NPO Grooming: Wash/dry face, Minimal assistance, Sitting Grooming  Details (indicate cue type and reason): requires cues even with oral drainage Upper Body Bathing: Total assistance Lower Body Bathing: Total assistance Upper Body Dressing : Total assistance Lower Body Dressing: Total assistance General ADL Comments: pt supine to sit only this session with decr BP noted.   Cognition: Cognition Overall Cognitive Status: Impaired/Different from baseline Arousal/Alertness: Lethargic Orientation Level: Oriented X4 Attention: Sustained Sustained Attention: Impaired Sustained Attention Impairment: Functional basic, Verbal basic Awareness: Impaired Awareness Impairment: Emergent impairment Safety/Judgment: Impaired Cognition Arousal/Alertness: Lethargic (didnt sleep well last night) Behavior During Therapy: WFL for tasks assessed/performed, Impulsive Overall Cognitive Status: Impaired/Different from baseline Area of Impairment: Safety/judgement Orientation Level: Disoriented to, Time, Place Following Commands: Follows multi-step commands consistently Awareness: Emergent General Comments: A&0x4. Able to translate story in [place] today per wife. Seems to be back to cognitive baseline.  Blood pressure 123/63, pulse 69, temperature 98 F (36.7 C), resp. rate 19, height 5\' 5"  (1.651 m), weight 76.9 kg (169 lb 8.5 oz), SpO2 100 %. Physical Exam  Vitals reviewed. Constitutional: He appears well-developed and well-nourished.  HENT:  Head: Normocephalic and atraumatic.  +NG  Eyes:  Pupils reactive to light Keeps eyes closed  Neck: Normal range of motion. Neck supple. No thyromegaly present.  Cardiovascular: Normal heart sounds.   Irregularly irregular  Respiratory: Effort normal and breath sounds normal. No respiratory distress.  GI: Soft. Bowel sounds are normal. He exhibits no distension.  Musculoskeletal: He exhibits no edema or tenderness.  Neurological: He is alert.  Nonverbal Occasionally following commands, but notes to be moving RUE/RLE  spontaneously No movement noted LUE/LLE Left facial weakness DTRs symmetric  Skin: Skin is warm and dry.  Psychiatric:  Unable to assess due to somnolence and cognition. Perseverative behaviors noted    Results for orders placed or performed during the hospital encounter of 11/03/2016 (from the past 24 hour(s))  Glucose, capillary     Status: Abnormal   Collection Time: 11/28/16  3:50 PM  Result Value Ref Range   Glucose-Capillary 167 (H) 65 - 99 mg/dL  Hemoglobin     Status: Abnormal   Collection Time: 11/28/16  4:18 PM  Result Value Ref Range   Hemoglobin 8.2 (L) 13.0 - 17.0 g/dL  Glucose, capillary     Status: Abnormal   Collection Time: 11/28/16  8:01 PM  Result Value Ref Range   Glucose-Capillary 160 (H) 65 - 99 mg/dL  Glucose, capillary     Status: Abnormal   Collection Time: 11/28/16 11:33 PM  Result Value Ref Range   Glucose-Capillary 172 (H) 65 - 99 mg/dL  Glucose, capillary     Status: Abnormal   Collection Time: 11/29/16  3:34 AM  Result Value Ref Range   Glucose-Capillary 146 (H) 65 - 99 mg/dL  CBC  Status: Abnormal   Collection Time: 11/29/16  5:32 AM  Result Value Ref Range   WBC 8.8 4.0 - 10.5 K/uL   RBC 2.55 (L) 4.22 - 5.81 MIL/uL   Hemoglobin 7.9 (L) 13.0 - 17.0 g/dL   HCT 16.1 (L) 09.6 - 04.5 %   MCV 95.7 78.0 - 100.0 fL   MCH 31.0 26.0 - 34.0 pg   MCHC 32.4 30.0 - 36.0 g/dL   RDW 40.9 (H) 81.1 - 91.4 %   Platelets 180 150 - 400 K/uL  Basic metabolic panel     Status: Abnormal   Collection Time: 11/29/16  5:32 AM  Result Value Ref Range   Sodium 151 (H) 135 - 145 mmol/L   Potassium 3.4 (L) 3.5 - 5.1 mmol/L   Chloride 121 (H) 101 - 111 mmol/L   CO2 19 (L) 22 - 32 mmol/L   Glucose, Bld 190 (H) 65 - 99 mg/dL   BUN 36 (H) 6 - 20 mg/dL   Creatinine, Ser 7.82 (H) 0.61 - 1.24 mg/dL   Calcium 8.0 (L) 8.9 - 10.3 mg/dL   GFR calc non Af Amer 25 (L) >60 mL/min   GFR calc Af Amer 29 (L) >60 mL/min   Anion gap 11 5 - 15  Glucose, capillary     Status:  Abnormal   Collection Time: 11/29/16  7:48 AM  Result Value Ref Range   Glucose-Capillary 201 (H) 65 - 99 mg/dL   Comment 1 Notify RN    Comment 2 Document in Chart   Glucose, capillary     Status: Abnormal   Collection Time: 11/29/16 11:47 AM  Result Value Ref Range   Glucose-Capillary 181 (H) 65 - 99 mg/dL   Comment 1 Notify RN    Comment 2 Document in Chart    Ct Head Wo Contrast  Result Date: 11/28/2016 CLINICAL DATA:  Stroke.  Status post revascularization. EXAM: CT HEAD WITHOUT CONTRAST TECHNIQUE: Contiguous axial images were obtained from the base of the skull through the vertex without intravenous contrast. COMPARISON:  11/10/2016 FINDINGS: Brain: Cytotoxic edema in the right MCA territory affecting the insula, operculum, and striatum. Infarct extent is unchanged from prior brain MRI. Small volume hemorrhagic conversion greatest in the putamen where there is an 11 mm hematoma, stable from prior brain MRI. Small area of right parietal and occipital cortex infarct, also stable. No new infarct noted. Swelling effaces the right lateral ventricle frontal horn and anterior body. No measurable midline shift. No entrapment. Old white matter infarct in the left frontal lobe. Vascular: Pipeline stent on the right M1 segment. No hyperdense vessel. Skull: No acute or aggressive finding Sinuses/Orbits: Chronic sinusitis with greatest opacification in the atelectatic left maxillary sinus. Bilateral cataract resection. No acute finding. IMPRESSION: 1. Stable compared to brain MRI 11/24/2016. 2. Extensive right MCA territory infarction with mild hemorrhagic conversion (11 mm hematoma present in the putamen). Electronically Signed   By: Marnee Spring M.D.   On: 11/28/2016 16:27    Assessment/Plan: Diagnosis: Right MCA infarct with hemorrhagic transformation s/p TPA Labs and images independently reviewed.  Records reviewed and summated above. Stroke: Continue secondary stroke prophylaxis and Risk Factor  Modification listed below:   Antiplatelet therapy:   Blood Pressure Management:  Continue current medication with prn's with permisive HTN per primary team Statin Agent:   Left sided hemiparesis: fit for orthosis to prevent contractures (resting hand splint for day, wrist cock up splint at night, PRAFO, etc) Motor recovery: Fluoxetine  1. Does  the need for close, 24 hr/day medical supervision in concert with the patient's rehab needs make it unreasonable for this patient to be served in a less intensive setting? Yes 2. Co-Morbidities requiring supervision/potential complications: BPH (cont meds), cardiomyopathy (cont meds), HTN (monitor and provide prns in accordance with increased physical exertion and pain), diabetes mellitus (Monitor in accordance with exercise and adjust meds as necessary), urinary retention/hematuria (recs per Urology), tachypnea (monitor RR and O2 Sats with increased physical exertion), pyrexia (cont to monitor), Afib (monitor HR with increased activity), SIRS (cont to monitor for signs and symptoms of infection, further workup if indicated) 3. Due to bladder management, bowel management, safety, skin/wound care, disease management, medication administration, pain management and patient education, does the patient require 24 hr/day rehab nursing? Yes 4. Does the patient require coordinated care of a physician, rehab nurse, PT (1-2 hrs/day, 5 days/week), OT (1-2 hrs/day, 5 days/week) and SLP (1-2 hrs/day, 5 days/week) to address physical and functional deficits in the context of the above medical diagnosis(es)? Yes Addressing deficits in the following areas: balance, endurance, locomotion, strength, transferring, bowel/bladder control, bathing, dressing, feeding, grooming, toileting, cognition, speech, language, swallowing and psychosocial support 5. Can the patient actively participate in an intensive therapy program of at least 3 hrs of therapy per day at least 5 days per week?  Potentially 6. The potential for patient to make measurable gains while on inpatient rehab is excellent 7. Anticipated functional outcomes upon discharge from inpatient rehab are min assist and mod assist  with PT, min assist and mod assist with OT, min assist and mod assist with SLP. 8. Estimated rehab length of stay to reach the above functional goals is: 20-26 days. 9. Anticipated D/C setting: Other 10. Anticipated post D/C treatments: HH therapy and Home excercise program 11. Overall Rehab/Functional Prognosis: good and fair  RECOMMENDATIONS: This patient's condition is appropriate for continued rehabilitative care in the following setting: Possibly CIR when medically stable and able to participate in 3 hours therapy/day. Patient has agreed to participate in recommended program. Potentially Note that insurance prior authorization may be required for reimbursement for recommended care.  Comment: Rehab Admissions Coordinator to follow up.  Maryla Morrow, MD, [place] [facility] Charlton Amor., PA-C 11/29/2016

## 2016-11-29 NOTE — Progress Notes (Signed)
A/P: 1) gross hematuria - I discontinued the CBI. I see H/H dropped, but the urine is currently clear. Hematuria seems to have resolved for now, but certainly at high risk for recurrence.   2) BPH- about a 170 g prostate on CT. Continue finasteride  3) Urinary retention - continue foley. Void trial when patient stable and / or ambulatory.   S: Pt sleeping. Nurse reports urine remaining clear on slow CBI.  Also passed along similar report from overnight nurse who took care of patient over weekend. I reviewed the notes from the weekend, the U/S images and discussed patient with Dr. Sherryl Barters.  O: Vitals:   11/29/16 0700 11/29/16 0800  BP: (!) 169/85 103/61  Pulse: 87 74  Resp: (!) 27 15  Temp:  98.9 F (37.2 C)   CBC    Component Value Date/Time   WBC 8.8 11/29/2016 0532   RBC 2.55 (L) 11/29/2016 0532   HGB 7.9 (L) 11/29/2016 0532   HGB 8.8 (L) 07/08/2013 0431   HCT 24.4 (L) 11/29/2016 0532   HCT 30.4 (L) 03/12/2015 1442   PLT 180 11/29/2016 0532   PLT 264 03/12/2015 1442   MCV 95.7 11/29/2016 0532   MCV 87 03/12/2015 1442   MCV 88 07/08/2013 0431   MCH 31.0 11/29/2016 0532   MCHC 32.4 11/29/2016 0532   RDW 17.1 (H) 11/29/2016 0532   RDW 22.1 (H) 03/12/2015 1442   RDW 14.3 07/08/2013 0431   LYMPHSABS 1.1 11/24/2016 0514   LYMPHSABS 1.7 03/12/2015 1442   LYMPHSABS 1.6 07/08/2013 0431   MONOABS 0.5 11/24/2016 0514   MONOABS 1.5 (H) 07/08/2013 0431   EOSABS 0.1 11/24/2016 0514   EOSABS 0.3 03/12/2015 1442   EOSABS 0.3 07/08/2013 0431   BASOSABS 0.0 11/24/2016 0514   BASOSABS 0.0 03/12/2015 1442   BASOSABS 0.1 07/08/2013 0431   BMET    Component Value Date/Time   NA 151 (H) 11/29/2016 0532   NA 140 12/09/2015 1157   NA 138 07/08/2013 0431   K 3.4 (L) 11/29/2016 0532   K 4.0 07/08/2013 0431   CL 121 (H) 11/29/2016 0532   CL 106 07/08/2013 0431   CO2 19 (L) 11/29/2016 0532   CO2 28 07/08/2013 0431   GLUCOSE 190 (H) 11/29/2016 0532   GLUCOSE 146 (H) 07/08/2013  0431   BUN 36 (H) 11/29/2016 0532   BUN 26 12/09/2015 1157   BUN 12 07/08/2013 0431   CREATININE 2.23 (H) 11/29/2016 0532   CREATININE 1.18 07/08/2013 0431   CALCIUM 8.0 (L) 11/29/2016 0532   CALCIUM 8.0 (L) 07/08/2013 0431   GFRNONAA 25 (L) 11/29/2016 0532   GFRNONAA 57 (L) 07/08/2013 0431   GFRAA 29 (L) 11/29/2016 0532   GFRAA >60 07/08/2013 0431     PE: NAD Resting Unlabored breathing  Abd - soft, NT GU - urine clear on slow CBI. I took any traction off foley and urine remained clear.   Irrigation - I hand irrigated the catheter.  The irrigation was clear.  I irrigated out a few small old clots.  There was no active bleeding.

## 2016-11-29 NOTE — Progress Notes (Signed)
Patient states he feels a pressure and pain in bladder. Urine is red but output is normal. Bladder scan showed <50 in bladder. Notified MD [place], and said to hand irrigate as needed to be sure there is no occlusion. Will continue to monitor.

## 2016-11-29 NOTE — Progress Notes (Signed)
Physical Therapy Treatment Patient Details Name: [name] MRN: [id number] DOB: [birth year] Today's Date: 11/29/2016    History of Present Illness Patient is a 83 y/o male who presents with L sided weakness, facial droop, dysarthria, and left hemineglect. Pt with R MCA infarct d/t R MCA M1 occlusion s/p revascularization w/ mechanical thrombectomy and rescue stent placement. MRI- Acute R BG and small part frontal lobe infarct. Continuous bladder irrigation d/ced 6/4. PMH includes A-fib, CAD, cardiomyopathy, HTN, MI, DM.    PT Comments    Patient alert and interactive today. Per wife, back to cognitive baseline. Eager to get OOB. Tolerated sitting and standing balance with assist of 2 due to left knee instability and lean. Able to transfer to chair today with assist of 2. Pt very happy to be OOB and in chair. Pt with subluxed shoulder 1 finger length today, reports as only source of pain. Pt progressing well and improving since last week so d/c recommendation updated to CIR. Pt independent PTA. Will follow.   Follow Up Recommendations  CIR     Equipment Recommendations  Other (comment) (defer to next venue)    Recommendations for Other Services       Precautions / Restrictions Precautions Precautions: Fall Precaution Comments: cortrak Restrictions Weight Bearing Restrictions: No    Mobility  Bed Mobility Overal bed mobility: Needs Assistance Bed Mobility: Supine to Sit     Supine to sit: Max assist;HOB elevated     General bed mobility comments: Assist to bring LLE off bed and to elevate trunk.   Transfers Overall transfer level: Needs assistance Equipment used:  (back of chair) Transfers: Sit to/from [employer] Sit to Stand: Mod assist;+2 physical assistance Stand pivot transfers: Max assist;+2 physical assistance       General transfer comment: Assist to power to standing with therapist stabilizing left knee. Pulling up on chair. Manual cues for  hip ext and upright posture. Pt leaning left. SPT to chair towards right with chuck pad.  Ambulation/Gait                 Stairs            Wheelchair Mobility    Modified Rankin (Stroke Patients Only) Modified Rankin (Stroke Patients Only) Pre-Morbid Rankin Score: No significant disability Modified Rankin: Severe disability     Balance Overall balance assessment: Needs assistance Sitting-balance support: Feet supported;No upper extremity supported Sitting balance-Leahy Scale: Poor Sitting balance - Comments: Pt with left lateral lean sitting EOB; worked on finding midline and core activation. BP stable. Postural control: Left lateral lean Standing balance support: During functional activity;Single extremity supported Standing balance-Leahy Scale: Zero Standing balance comment: Requires UE support and Mod A of 2 for hip ext and left knee stability.                             Cognition Arousal/Alertness: Lethargic (didnt sleep well last night) Behavior During Therapy: WFL for tasks assessed/performed;Impulsive Overall Cognitive Status: Impaired/Different from baseline Area of Impairment: Safety/judgement                       Following Commands: Follows multi-step commands consistently   Awareness: Emergent   General Comments: A&0x4. Able to translate story in [place] today per wife. Seems to be back to cognitive baseline.      Exercises      General Comments General comments (skin integrity, edema, etc.): Wife and  daughter present during session.      Pertinent Vitals/Pain Pain Assessment: Faces Faces Pain Scale: Hurts little more Pain Location: left shoulder Pain Descriptors / Indicators: Sore Pain Intervention(s): Monitored during session;Repositioned    Home Living                      Prior Function            PT Goals (current goals can now be found in the care plan section) Progress towards PT goals:  Progressing toward goals    Frequency    Min 3X/week      PT Plan Discharge plan needs to be updated    Co-evaluation              AM-PAC PT "6 Clicks" Daily Activity  Outcome Measure  Difficulty turning over in bed (including adjusting bedclothes, sheets and blankets)?: Total Difficulty moving from lying on back to sitting on the side of the bed? : Total Difficulty sitting down on and standing up from a chair with arms (e.g., wheelchair, bedside commode, etc,.)?: Total Help needed moving to and from a bed to chair (including a wheelchair)?: Total Help needed walking in hospital room?: Total Help needed climbing 3-5 steps with a railing? : Total 6 Click Score: 6    End of Session Equipment Utilized During Treatment: Gait belt Activity Tolerance: Patient tolerated treatment well Patient left: in chair;with call bell/phone within reach;with family/visitor present Nurse Communication: Mobility status PT Visit Diagnosis: Hemiplegia and hemiparesis Hemiplegia - Right/Left: Left Hemiplegia - dominant/non-dominant: Non-dominant Hemiplegia - caused by: Cerebral infarction     Time: 6222-9798 PT Time Calculation (min) (ACUTE ONLY): 23 min  Charges:  $Therapeutic Activity: 8-22 mins $Neuromuscular Re-education: 8-22 mins                    G Codes:       [name], PT, DPT [phone]     [name] 11/29/2016, 11:46 AM

## 2016-11-29 NOTE — Progress Notes (Signed)
Both PT and SLP are now recommending an inpt rehab admission at this time. Therefore I will place order for consult to assess if pt would be a candidate. [phone]

## 2016-11-29 NOTE — Progress Notes (Signed)
Per urology instructions. Turned off the continuous bladder irrigation. Will continue to monitor if patient urine output decreases or urine becomes darker red.

## 2016-11-29 NOTE — Progress Notes (Signed)
Nutrition Follow-up   INTERVENTION:   D/C Jevity 1.2  Glucerna 1.2 @ 55 ml/hr (1320 ml/day) 30 ml Prostat daily  180 ml free water every 6 hours   Provides: 1684 kcal, 94 grams protein, and 1062 ml free water Total free water: 1782 ml   NUTRITION DIAGNOSIS:   Inadequate oral intake related to inability to eat as evidenced by NPO status. Ongoing.   GOAL:   Patient will meet greater than or equal to 90% of their needs Progressing.   MONITOR:   Diet advancement, I & O's, TF tolerance  REASON FOR ASSESSMENT:   Consult Enteral/tube feeding initiation and management  ASSESSMENT:   Pt with PMH of afib admitted with R MCA infarct due to occlusion, s/p revascularization with mechanical thrombectomy and stent placement.   Pt failed FEES again today.  Consult received to order TF and free water Labs reviewed: Na 151 CBG's: [phone] Discussed with RN Family at bedside.   Diet Order:  Diet NPO time specified  Skin:  Reviewed, no issues  Last BM:  5/31  Height:   Ht Readings from Last 1 Encounters:  11/28/16 5\' 5"  (1.651 m)    Weight:   Wt Readings from Last 1 Encounters:  11/29/16 169 lb 8.5 oz (76.9 kg)    Ideal Body Weight:  61.8 kg  BMI:  Body mass index is 28.21 kg/m.  Estimated Nutritional Needs:   Kcal:  1600-1800  Protein:  90-100 grams  Fluid:  >1.9 L/day  EDUCATION NEEDS:   No education needs identified at this time  Kendell Bane RD, LDN, CNSC [phone] Pager [phone] After Hours Pager

## 2016-11-29 NOTE — Progress Notes (Signed)
STROKE TEAM PROGRESS NOTE   SUBJECTIVE (INTERVAL HISTORY) Wife and daughter are at bedside. Pt is sitting in chair after working with PT/OT. Sleepy and dozing off in chair. Hematuria much improved and CBI was discontinued. No surgery needed at this time. Still did not pass swallow yet.  OBJECTIVE Temp:  [98.1 F (36.7 C)-100 F (37.8 C)] 98.9 F (37.2 C) (06/04 0800) Pulse Rate:  [53-103] 71 (06/04 1100) Cardiac Rhythm: Atrial fibrillation (06/04 0800) Resp:  [14-28] 14 (06/04 0900) BP: (100-182)/(46-103) 122/65 (06/04 1100) SpO2:  [94 %-100 %] 100 % (06/04 1100) Weight:  [169 lb 8.5 oz (76.9 kg)] 169 lb 8.5 oz (76.9 kg) (06/04 0500)  CBC:   Recent Labs Lab 12-17-2016 1302 11/24/16 0514  11/28/16 0629 11/28/16 1618 11/29/16 0532  WBC 6.0 8.2  < > 9.9  --  8.8  NEUTROABS 4.3 6.5  --   --   --   --   HGB 9.6* 9.9*  < > 8.3* 8.2* 7.9*  HCT 30.6* 30.8*  < > 25.7*  --  24.4*  MCV 89.7 90.6  < > 93.5  --  95.7  PLT 151 154  < > 171  --  180  < > = values in this interval not displayed.  Basic Metabolic Panel:   Recent Labs Lab 11/25/16 1850 11/26/16 0508 11/28/16 0629 11/29/16 0532  NA  --   --  149* 151*  K  --   --  2.9* 3.4*  CL  --   --  122* 121*  CO2  --   --  18* 19*  GLUCOSE  --   --  154* 190*  BUN  --   --  41* 36*  CREATININE  --   --  2.01* 2.23*  CALCIUM  --   --  7.7* 8.0*  MG 2.1 2.0  --   --   PHOS 3.4 3.6  --   --     Lipid Panel:     Component Value Date/Time   CHOL 115 11/24/2016 0514   TRIG 177 (H) 11/24/2016 0514   HDL 21 (L) 11/24/2016 0514   CHOLHDL 5.5 11/24/2016 0514   VLDL 35 11/24/2016 0514   LDLCALC 59 11/24/2016 0514   HgbA1c:  Lab Results  Component Value Date   HGBA1C 8.2 (H) 11/24/2016   Imaging I have personally reviewed the radiological images below and agree with the radiology interpretations.  Ct Head Wo Contrast 12-17-2016 Pipeline stent placement in the right MCA. Contrast staining in regions of infarction  affecting the right caudate and putamen. Low-density/infarction also evident in the sub insular brain without contrast staining. No evidence of new infarction. No significant mass effect. No demonstrable hemorrhage, though petechial bleeding could be hidden within the contrast staining.   [name] Head Wo Contrast 2016-12-17 1. Acute infarct involving the right basal ganglia and small portion of the anteroinferior frontal lobe.  2. Distal right M1 MCA occlusion.  3. 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   Mr Brain Wo Contrast 11/24/2016 Acute multifocal RIGHT MCA territory and posterior watershed infarcts, further propagation from prior MRI.  Acute subcentimeter LEFT cerebellar infarct.  RIGHT basal ganglia petechial hemorrhage without lobar hematoma.  Old hemorrhagic LEFT basal ganglia infarct and moderate to severe chronic small vessel ischemic disease.   DSA 11/25/2016 Status post endovascular complete revascularization of occluded right middle cerebral artery M1 segment with 3 passes of a 4 mm x  40 mm Solitaire FR retrieval device achieving a TICI 3 reperfusion with immediate occlusion of the vessel due to underlying severe segmental arteriosclerotic disease. Status post rescue stenting right middle cerebral artery due to recurrent occlusion despite retrieval thrombectomy attempts as described above, achieving a TICI 3 reperfusion.   Ct Head Code Stroke W/o Cm 11/19/2016 1. Hyperdense distal right M1/ MCA bifurcation with evidence of acute infarction in the right basal ganglia.  2. ASPECTS is at best 8.  3. No acute intracranial hemorrhage.   Transthoracic Echocardiogram 11/25/2016 - Left ventricle: The cavity size was normal. Wall thickness was   increased in a pattern of moderate LVH. Systolic function was   normal. The estimated ejection fraction was in the range of 50%   to 55%. Images were inadequate for LV wall motion assessment.  The   study is not technically sufficient to allow evaluation of LV   diastolic function. - Mitral valve: Mildly thickened leaflets . There was trivial   regurgitation. Valve area by pressure half-time: 1.25 cm^2. - Left atrium: Severely dilated. - Right atrium: The atrium was mildly dilated. - Tricuspid valve: There was moderate regurgitation. - Pulmonary arteries: PA peak pressure: 53 mm Hg (S). - Inferior vena cava: The vessel was normal in size. The   respirophasic diameter changes were in the normal range (>= 50%),   consistent with normal central venous pressure. Impressions: - Compared to a prior study in 2017, the LVEF is lower at 50-55%,   moderate LVH, a-fib is present. There is severe LAE and moderate   RAE, moderate TR, RVSP 53 mmHg, normal IVC size.  CUS 11/24/2016 The vertebral arteries appear patent with antegrade flow. - Findings consistent with a 1-39 percent stenosis involving the   right internal carotid artery and the left internal carotid   artery.  Ct Head Wo Contrast 11/28/2016 IMPRESSION: 1. Stable compared to brain MRI 11/24/2016. 2. Extensive right MCA territory infarction with mild hemorrhagic conversion (11 mm hematoma present in the putamen).    PHYSICAL EXAM  Temp:  [98.1 F (36.7 C)-100 F (37.8 C)] 98.9 F (37.2 C) (06/04 0800) Pulse Rate:  [53-103] 71 (06/04 1100) Resp:  [14-28] 14 (06/04 0900) BP: (100-182)/(46-103) 122/65 (06/04 1100) SpO2:  [94 %-100 %] 100 % (06/04 1100) Weight:  [169 lb 8.5 oz (76.9 kg)] 169 lb 8.5 oz (76.9 kg) (06/04 0500)  General - Well nourished, well developed, sleepy.  Ophthalmologic - Fundi not visualized due to noncooperation.  Cardiovascular - irregularly irregular heart rate and rhythm.  Mental Status -  Level of arousal and orientation to time, place, and person were intact. Language including expression, naming, repetition, comprehension was assessed and found intact. Mild dysarthria.   Cranial Nerves  II - XII - II - Visual field intact OU. III, IV, VI - right gaze preference but able to cross midline. V - Facial sensation intact bilaterally. VII - left facial droop. VIII - Hearing & vestibular intact bilaterally. X - Palate elevates symmetrically, mild dysarthria XI - Chin turning & shoulder shrug intact bilaterally. XII - Tongue protrusion intact.  Motor Strength - The patient's strength was normal in RUE and RLE, but 0/5 LUE and 2/5 LLE.  Bulk was normal and fasciculations were absent.   Motor Tone - Muscle tone was assessed at the neck and appendages and was increase at LUE.  Reflexes - The patient's reflexes were 1+ in all extremities and he had no pathological reflexes.  Sensory - Light touch, temperature/pinprick were  assessed and were symmetrical.    Coordination - not cooperative on exam.  Tremor was absent.  Gait and Station - not tested.   ASSESSMENT/PLAN [name] is a 83 y.o. male with history of of AF not on AC due to recent GIB, HTN, CAD, OSA who woke with L hemiparesis, dysarthria and L neglect. He did not receive IV t-PA due to delay in arrival. CT and MRI showed hyperdense R MCA. Taken to IR where he received TICI3 revascularization following mechanical thrombectomy and rescue stent placement.   Stroke:   R MCA infarct with right M1 occlusion s/p TICI3 mechanical thrombectomy and rescue stent placement. Infarct likely due to afib not on AC. Additional posterior watershed infarct and L cerebellar infarct.  Code Stroke CT hyperdense R MCA with acute R BG infarct. Aspects 8.  Ltd MRI  Acte R BG and small part frontal lobe infarct.   MRA  Distal R M1 occlusion.  DSA occluded R MCA M1s/p rescue stent placed w/ resultant TICI3 reperfusion  MRI post IR - R MCA, posterior watershed and L cerebellar infarcts. R BG petechial hemorrhage. Old L BG infarct. Moderate small vessel disease.   Carotid doppler no significant extracranial stenosis  2D Echo - EF  50-55% with atrial fibrillation noted.   LDL 59  HgbA1c 8.2  SCDs for VTE prophylaxis  Diet NPO time specified   No antithrombotic prior to admission, now on aspirin 325 mg daily and clopidogrel on hold. Will discuss with urology regarding the timing for resumption of plavix  Therapy recommendations:  CIR  Disposition:  pending   Hematuria   Urology on board, appreciate recs  Improving  plavix on hold  No surgery planned for now  Need to discuss with urology regarding when to resume plavix  Atrial Fibrillation  Home anticoagulation:  none   Eliquis stopped 2 months ago d/t GIB  On ASA now, plavix on hold  Not candidate for [facility] currently due to large infarct.               Hypertension  BP goal normotensive  Stable, sometimes low  Avoid hypotension  Diabetes type II  HgbA1c 8.2, goal < 7.0  Uncontrolled  SSI  Dehydration  Cre 2.01->2.23  Na 149->151  Restart TF @ 50  Continue NS @ 50  Free water @ 10  BMP monitoring  Other Stroke Risk Factors  Advanced age  Former Cigarette smoker  UDS / ETOH level not performed   Overweight, Body mass index is 28.21 kg/m.   Coronary artery disease - MI  Obstructive sleep apnea, unable to use CPAP at home  Cardiomyopathy   Other Active Problems  Hx asymptomatic bradycardia  Hypokalemia (lasix 80 mg daily) - 3.6 -> 2.9 ->3.4 supplemented - recheck in AM  CKD stage III - Cre 2.18->2.01->2.33  Anemia due to acute blood loss - 9.1->8.3->7.9  Hospital day # 6  This patient is critically ill due to right MCA infarct s/p IR and MCA stent, hematuria requiring PRBC transfusion, afib, anemia and at significant risk of neurological worsening, death form recurrent stroke, hemorrhagic transformation, shock, heart failure. This patient's care requires constant monitoring of vital signs, hemodynamics, respiratory and cardiac monitoring, review of multiple databases, neurological assessment,  discussion with family, other specialists and medical decision making of high complexity. I spent 40 minutes of neurocritical care time in the care of this patient.   Marvel Plan, MD PhD Stroke Neurology 11/29/2016 11:48 AM    To  contact Stroke Continuity provider, please refer to [email or web address] After hours, contact General Neurology

## 2016-11-29 NOTE — Procedures (Signed)
Objective Swallowing Evaluation: Type of Study: FEES-Fiberoptic Endoscopic Evaluation of Swallow  Patient Details  Name: [name] MRN: [id number] Date of Birth: Jan 05, 1928  Today's Date: 11/29/2016 Time: SLP Start Time (ACUTE ONLY): 1307-SLP Stop Time (ACUTE ONLY): 1340 SLP Time Calculation (min) (ACUTE ONLY): 33 min  Past Medical History:  Past Medical History:  Diagnosis Date  . Anemia   . BPH (benign prostatic hypertrophy)   . Cardiomyopathy (HCC)   . Coronary artery disease   . Diverticulosis   . Essential hypertension, benign   . GI bleed 2015  . History of kidney stones   . Hypersomnia with sleep apnea, unspecified   . MI (myocardial infarction) (HCC) 1996  . Occasional tremors   . Other and unspecified hyperlipidemia   . Pure hypercholesterolemia   . Sleep apnea    unable to use C-PAP  . Type II or unspecified type diabetes mellitus without mention of complication, uncontrolled    Past Surgical History:  Past Surgical History:  Procedure Laterality Date  . CARDIAC CATHETERIZATION    . CHOLECYSTECTOMY  1994  . CORONARY ANGIOPLASTY  1996   [place]  . CYSTOSCOPY W/ URETERAL STENT PLACEMENT Left 05/31/2016   Procedure: CYSTOSCOPY WITH STENT REPLACEMENT;  Surgeon: [name], MD;  Location: [facility] ORS;  Service: Urology;  Laterality: Left;  . CYSTOSCOPY WITH LITHOLAPAXY N/A 05/11/2016   Procedure: CYSTOSCOPY WITH LITHOLAPAXY;  Surgeon: [name], MD;  Location: [facility];  Service: Urology;  Laterality: N/A;  . CYSTOSCOPY/URETEROSCOPY/HOLMIUM LASER/STENT PLACEMENT Left 05/11/2016   Procedure: CYSTOSCOPY/URETEROSCOPY/HOLMIUM LASER/STENT PLACEMENT;  Surgeon: [name], MD;  Location: [facility];  Service: Urology;  Laterality: Left;  . ESOPHAGOGASTRODUODENOSCOPY (EGD) WITH PROPOFOL Left 11/23/2015   Procedure: ESOPHAGOGASTRODUODENOSCOPY (EGD) WITH PROPOFOL;  Surgeon: [name], MD;  Location: [facility] ENDOSCOPY;  Service: Endoscopy;  Laterality: Left;  . EYE  SURGERY Bilateral    Cataract Extraction with IOL   . FLEXIBLE SIGMOIDOSCOPY    . HERNIA REPAIR Left    Inguinal Hernia Repair  . inguinal hernia repair     left inguinal   . INSERTION OF SUPRAPUBIC CATHETER N/A 05/31/2016   Procedure: INSERTION OF SUPRAPUBIC CATHETER;  Surgeon: [name], MD;  Location: [facility];  Service: Urology;  Laterality: N/A;  . IR GENERIC HISTORICAL  04/12/2016   IR NEPHROSTOMY PLACEMENT LEFT 04/12/2016 [name], MD [facility]-INTERV RAD  . IR PERCUTANEOUS ART THROMBECTOMY/INFUSION INTRACRANIAL INC DIAG ANGIO  11/20/2016  . KNEE ARTHROSCOPY Left   . NEPHROSTOMY  05/11/2016   Procedure: NEPHROSTOMY tude removal;  Surgeon: [name], MD;  Location: [facility];  Service: Urology;;  . RADIOLOGY WITH ANESTHESIA N/A 10/27/2016   Procedure: RADIOLOGY WITH ANESTHESIA;  Surgeon: Julieanne Cotton, MD;  Location: [facility] OR;  Service: Radiology;  Laterality: N/A;  . TONSILLECTOMY    . URETEROSCOPY  05/31/2016   Procedure: URETEROSCOPY;  Surgeon: [name], MD;  Location: [facility];  Service: Urology;;   HPI: Pt is an 83 year old male with cardiac history who presented with acute onset of left-sided weakness and slurred speech. He was not a tpa candidate, but was taken to IR for endovascular thrombectomy on 5/29 and remained intubated until 5/30. MRI on 5/30 revealed acute multifocal RIGHT MCA territory and posterior watershed infarcts, an acute subcentimeter LEFT cerebellar infarct, and RIGHT basal ganglia petechial hemorrhage without lobar hematoma.  Subjective: pt alert, impulsive, reduced safety awareness   Assessment / Plan / Recommendation  CHL IP CLINICAL IMPRESSIONS 11/29/2016  Clinical Impression Pt has decreased  pharyngeal/laryngeal irritation with only minimal, thicker secretions pooling in the pharynx. However, he continues to show significant, diffuse pharyngeal weakness and impaired timing that leads to silent penetration of all consistencies and severe  pharyngeal residuals with thickened liquids. SLP provided cueing for postural maneuvers without significant improvement observed. Ice chips were provided along with cues for effortful swallows, although still only mild improvement in pharyngeal residue. Recommend that he remain NPO except for ice chips after oral care. Encouraged family to cue him to perform effortful swallows throughout the day. Pt is impulsive with left-sided inattention and reduced emergent and safety awareness - education reinforced to him about need for ongoing swallowing precautions. Discussed with neurologist about pt's candidacy for EMST to target pharyngeal strength - he is agreemtn with proceeding. Will begin trials on next date.  SLP Visit Diagnosis Dysphagia, oropharyngeal phase (R13.12)  Attention and concentration deficit following --  Frontal lobe and executive function deficit following --  Impact on safety and function Severe aspiration risk      CHL IP TREATMENT RECOMMENDATION 11/29/2016  Treatment Recommendations Therapy as outlined in treatment plan below     Prognosis 11/29/2016  Prognosis for Safe Diet Advancement Good  Barriers to Reach Goals Cognitive deficits  Barriers/Prognosis Comment --    CHL IP DIET RECOMMENDATION 11/29/2016  SLP Diet Recommendations NPO;Ice chips PRN after oral care;Alternative means - temporary  Liquid Administration via --  Medication Administration Via alternative means  Compensations --  Postural Changes --      CHL IP OTHER RECOMMENDATIONS 11/29/2016  Recommended Consults --  Oral Care Recommendations Oral care QID  Other Recommendations Have oral suction available      CHL IP FOLLOW UP RECOMMENDATIONS 11/29/2016  Follow up Recommendations Inpatient Rehab      CHL IP FREQUENCY AND DURATION 11/29/2016  Speech Therapy Frequency (ACUTE ONLY) min 2x/week  Treatment Duration 2 weeks           CHL IP ORAL PHASE 11/29/2016  Oral Phase Impaired  Oral - Pudding Teaspoon --   Oral - Pudding Cup --  Oral - Honey Teaspoon --  Oral - Honey Cup --  Oral - Nectar Teaspoon NT  Oral - Nectar Cup Left anterior bolus loss;Weak lingual manipulation;Decreased bolus cohesion;Premature spillage  Oral - Nectar Straw --  Oral - Thin Teaspoon Left anterior bolus loss;Weak lingual manipulation;Decreased bolus cohesion;Premature spillage  Oral - Thin Cup Left anterior bolus loss;Weak lingual manipulation;Decreased bolus cohesion;Premature spillage  Oral - Thin Straw NT  Oral - Puree NT  Oral - Mech Soft --  Oral - Regular --  Oral - Multi-Consistency --  Oral - Pill --  Oral Phase - Comment --    CHL IP PHARYNGEAL PHASE 11/29/2016  Pharyngeal Phase Impaired  Pharyngeal- Pudding Teaspoon --  Pharyngeal --  Pharyngeal- Pudding Cup --  Pharyngeal --  Pharyngeal- Honey Teaspoon --  Pharyngeal --  Pharyngeal- Honey Cup --  Pharyngeal --  Pharyngeal- Nectar Teaspoon --  Pharyngeal --  Pharyngeal- Nectar Cup Delayed swallow initiation-pyriform sinuses;Reduced pharyngeal peristalsis;Reduced epiglottic inversion;Reduced anterior laryngeal mobility;Reduced laryngeal elevation;Reduced tongue base retraction;Pharyngeal residue - valleculae;Pharyngeal residue - pyriform;Pharyngeal residue - posterior pharnyx;Lateral channel residue;Penetration/Aspiration during swallow  Pharyngeal Material enters airway, remains ABOVE vocal cords and not ejected out  Pharyngeal- Nectar Straw --  Pharyngeal --  Pharyngeal- Thin Teaspoon Delayed swallow initiation-pyriform sinuses;Reduced pharyngeal peristalsis;Reduced epiglottic inversion;Reduced anterior laryngeal mobility;Reduced laryngeal elevation;Reduced tongue base retraction;Pharyngeal residue - valleculae;Pharyngeal residue - pyriform;Pharyngeal residue - posterior pharnyx;Lateral channel residue;Penetration/Apiration after swallow  Pharyngeal Material enters airway, remains ABOVE vocal cords and not ejected out  Pharyngeal- Thin Cup Delayed  swallow initiation-pyriform sinuses;Reduced pharyngeal peristalsis;Reduced epiglottic inversion;Reduced anterior laryngeal mobility;Reduced laryngeal elevation;Reduced tongue base retraction;Pharyngeal residue - valleculae;Pharyngeal residue - pyriform;Pharyngeal residue - posterior pharnyx;Lateral channel residue;Penetration/Aspiration before swallow  Pharyngeal Material enters airway, remains ABOVE vocal cords and not ejected out  Pharyngeal- Thin Straw NT  Pharyngeal --  Pharyngeal- Puree NT  Pharyngeal --  Pharyngeal- Mechanical Soft --  Pharyngeal --  Pharyngeal- Regular --  Pharyngeal --  Pharyngeal- Multi-consistency --  Pharyngeal --  Pharyngeal- Pill --  Pharyngeal --  Pharyngeal Comment --     No flowsheet data found.  No flowsheet data found.  [name] 11/29/2016, 2:55 PM   [name], M.A. CCC-SLP [phone]

## 2016-11-30 ENCOUNTER — Inpatient Hospital Stay (HOSPITAL_COMMUNITY): Payer: Medicare Other

## 2016-11-30 ENCOUNTER — Encounter (HOSPITAL_COMMUNITY): Payer: Self-pay | Admitting: Certified Registered Nurse Anesthetist

## 2016-11-30 DIAGNOSIS — R6510 Systemic inflammatory response syndrome (SIRS) of non-infectious origin without acute organ dysfunction: Secondary | ICD-10-CM

## 2016-11-30 DIAGNOSIS — I255 Ischemic cardiomyopathy: Secondary | ICD-10-CM

## 2016-11-30 DIAGNOSIS — I2119 ST elevation (STEMI) myocardial infarction involving other coronary artery of inferior wall: Secondary | ICD-10-CM

## 2016-11-30 DIAGNOSIS — R0682 Tachypnea, not elsewhere classified: Secondary | ICD-10-CM

## 2016-11-30 DIAGNOSIS — I219 Acute myocardial infarction, unspecified: Secondary | ICD-10-CM

## 2016-11-30 DIAGNOSIS — R570 Cardiogenic shock: Secondary | ICD-10-CM

## 2016-11-30 DIAGNOSIS — R509 Fever, unspecified: Secondary | ICD-10-CM

## 2016-11-30 DIAGNOSIS — I10 Essential (primary) hypertension: Secondary | ICD-10-CM

## 2016-11-30 DIAGNOSIS — D62 Acute posthemorrhagic anemia: Secondary | ICD-10-CM

## 2016-11-30 DIAGNOSIS — N401 Enlarged prostate with lower urinary tract symptoms: Secondary | ICD-10-CM

## 2016-11-30 DIAGNOSIS — I63311 Cerebral infarction due to thrombosis of right middle cerebral artery: Secondary | ICD-10-CM

## 2016-11-30 DIAGNOSIS — R9431 Abnormal electrocardiogram [ECG] [EKG]: Secondary | ICD-10-CM

## 2016-11-30 DIAGNOSIS — E119 Type 2 diabetes mellitus without complications: Secondary | ICD-10-CM

## 2016-11-30 DIAGNOSIS — I4891 Unspecified atrial fibrillation: Secondary | ICD-10-CM

## 2016-11-30 DIAGNOSIS — R310 Gross hematuria: Secondary | ICD-10-CM

## 2016-11-30 DIAGNOSIS — R579 Shock, unspecified: Secondary | ICD-10-CM

## 2016-11-30 DIAGNOSIS — D500 Iron deficiency anemia secondary to blood loss (chronic): Secondary | ICD-10-CM

## 2016-11-30 DIAGNOSIS — R339 Retention of urine, unspecified: Secondary | ICD-10-CM

## 2016-11-30 LAB — BASIC METABOLIC PANEL
ANION GAP: 9 (ref 5–15)
Anion gap: 8 (ref 5–15)
BUN: 42 mg/dL — AB (ref 6–20)
BUN: 56 mg/dL — ABNORMAL HIGH (ref 6–20)
CALCIUM: 7.3 mg/dL — AB (ref 8.9–10.3)
CHLORIDE: 118 mmol/L — AB (ref 101–111)
CO2: 19 mmol/L — AB (ref 22–32)
CO2: 16 mmol/L — ABNORMAL LOW (ref 22–32)
CREATININE: 3.38 mg/dL — AB (ref 0.61–1.24)
Calcium: 7.8 mg/dL — ABNORMAL LOW (ref 8.9–10.3)
Chloride: 125 mmol/L — ABNORMAL HIGH (ref 101–111)
Creatinine, Ser: 2.7 mg/dL — ABNORMAL HIGH (ref 0.61–1.24)
GFR calc Af Amer: 23 mL/min — ABNORMAL LOW (ref >60)
GFR calc non Af Amer: 15 mL/min — ABNORMAL LOW (ref >60)
GFR, EST AFRICAN AMERICAN: 17 mL/min — AB (ref >60)
GFR, EST NON AFRICAN AMERICAN: 20 mL/min — AB (ref >60)
Glucose, Bld: 267 mg/dL — ABNORMAL HIGH (ref 65–99)
Glucose, Bld: 220 mg/dL — ABNORMAL HIGH (ref 65–99)
Potassium: 3.8 mmol/L (ref 3.5–5.1)
Potassium: 4.1 mmol/L (ref 3.5–5.1)
SODIUM: 149 mmol/L — AB (ref 135–145)
Sodium: 146 mmol/L — ABNORMAL HIGH (ref 135–145)

## 2016-11-30 LAB — TROPONIN I: Troponin I: >65 ng/mL (ref <0.03)

## 2016-11-30 LAB — CBC
HCT: 24.8 % — ABNORMAL LOW (ref 39.0–52.0)
HEMATOCRIT: 21.6 % — AB (ref 39.0–52.0)
HEMOGLOBIN: 6.9 g/dL — AB (ref 13.0–17.0)
Hemoglobin: 7.9 g/dL — ABNORMAL LOW (ref 13.0–17.0)
MCH: 31.2 pg (ref 26.0–34.0)
MCH: 28.9 pg (ref 26.0–34.0)
MCHC: 31.9 g/dL (ref 30.0–36.0)
MCHC: 31.9 g/dL (ref 30.0–36.0)
MCV: 97.7 fL (ref 78.0–100.0)
MCV: 90.8 fL (ref 78.0–100.0)
PLATELETS: 238 10*3/uL (ref 150–400)
PLATELETS: 204 10*3/uL (ref 150–400)
RBC: 2.21 MIL/uL — AB (ref 4.22–5.81)
RBC: 2.73 MIL/uL — ABNORMAL LOW (ref 4.22–5.81)
RDW: 17.5 % — ABNORMAL HIGH (ref 11.5–15.5)
RDW: 17.5 % — AB (ref 11.5–15.5)
WBC: 10.8 10*3/uL — AB (ref 4.0–10.5)
WBC: 11.6 10*3/uL — ABNORMAL HIGH (ref 4.0–10.5)

## 2016-11-30 LAB — URINALYSIS, MICROSCOPIC (REFLEX)

## 2016-11-30 LAB — GLUCOSE, CAPILLARY
GLUCOSE-CAPILLARY: 205 mg/dL — AB (ref 65–99)
GLUCOSE-CAPILLARY: 161 mg/dL — AB (ref 65–99)
Glucose-Capillary: 286 mg/dL — ABNORMAL HIGH (ref 65–99)
Glucose-Capillary: 231 mg/dL — ABNORMAL HIGH (ref 65–99)
Glucose-Capillary: 215 mg/dL — ABNORMAL HIGH (ref 65–99)

## 2016-11-30 LAB — URINALYSIS, ROUTINE W REFLEX MICROSCOPIC: Leukocytes, UA: NEGATIVE

## 2016-11-30 LAB — PROTIME-INR
INR: 1.44
PROTHROMBIN TIME: 17.7 s — AB (ref 11.4–15.2)

## 2016-11-30 LAB — MAGNESIUM: Magnesium: 1.9 mg/dL (ref 1.7–2.4)

## 2016-11-30 LAB — PROCALCITONIN: Procalcitonin: 0.43 ng/mL

## 2016-11-30 LAB — FIBRINOGEN: Fibrinogen: 427 mg/dL (ref 210–475)

## 2016-11-30 LAB — CREATININE, URINE, RANDOM: Creatinine, Urine: 13.88 mg/dL

## 2016-11-30 LAB — PREPARE RBC (CROSSMATCH)

## 2016-11-30 LAB — HEMOGLOBIN: Hemoglobin: 8.2 g/dL — ABNORMAL LOW (ref 13.0–17.0)

## 2016-11-30 LAB — CORTISOL: Cortisol, Plasma: 18.5 ug/dL

## 2016-11-30 LAB — SODIUM, URINE, RANDOM: Sodium, Ur: 13 mmol/L

## 2016-11-30 LAB — PHOSPHORUS: Phosphorus: 4.5 mg/dL (ref 2.5–4.6)

## 2016-11-30 LAB — APTT: aPTT: 34 seconds (ref 24–36)

## 2016-11-30 MED ORDER — SODIUM CHLORIDE 0.9% FLUSH: 10.0000 mL | INTRAVENOUS | Status: DC | PRN

## 2016-11-30 MED ORDER — VANCOMYCIN HCL IN DEXTROSE 1-5 GM/200ML-% IV SOLN
1000.0000 mg | INTRAVENOUS | Status: DC
  Administered 2016-11-30: 1000 mg via INTRAVENOUS
  Filled 2016-11-30: qty 200

## 2016-11-30 MED ORDER — SODIUM CHLORIDE 0.9 % IV BOLUS (SEPSIS)
250.0000 mL | Freq: Once | INTRAVENOUS | Status: AC
Start: 2016-11-30 — End: 2016-11-30
  Administered 2016-11-30: 250 mL via INTRAVENOUS

## 2016-11-30 MED ORDER — ONDANSETRON HCL 4 MG/2ML IJ SOLN
4.0000 mg | Freq: Four times a day (QID) | INTRAMUSCULAR | Status: DC | PRN
Start: 2016-11-30 — End: 2016-12-01

## 2016-11-30 MED ORDER — ALBUTEROL SULFATE (2.5 MG/3ML) 0.083% IN NEBU: 2.5000 mg | INHALATION_SOLUTION | RESPIRATORY_TRACT | Status: DC | PRN

## 2016-11-30 MED ORDER — SODIUM CHLORIDE 0.9 % IV SOLN
5.0000 mg/h | INTRAVENOUS | Status: DC
  Administered 2016-12-01: 5 mg/h via INTRAVENOUS
  Filled 2016-11-30: qty 10

## 2016-11-30 MED ORDER — GLYCOPYRROLATE 0.2 MG/ML IJ SOLN: 0.2000 mg | INTRAMUSCULAR | Status: AC

## 2016-11-30 MED ORDER — GLYCOPYRROLATE 1 MG PO TABS: 1.0000 mg | ORAL_TABLET | ORAL | Status: AC

## 2016-11-30 MED ORDER — HALOPERIDOL LACTATE 5 MG/ML IJ SOLN: 0.5000 mg | INTRAMUSCULAR | Status: DC | PRN

## 2016-11-30 MED ORDER — GLYCOPYRROLATE 0.2 MG/ML IJ SOLN: 0.2000 mg | INTRAMUSCULAR | Status: DC | PRN

## 2016-11-30 MED ORDER — PHENYLEPHRINE HCL 10 MG/ML IJ SOLN
0.0000 ug/min | INTRAMUSCULAR | Status: DC
  Administered 2016-11-30: 160 ug/min via INTRAVENOUS
  Administered 2016-11-30: 90 ug/min via INTRAVENOUS
  Administered 2016-11-30: 20 ug/min via INTRAVENOUS
  Filled 2016-11-30 (×4): qty 1

## 2016-11-30 MED ORDER — SODIUM CHLORIDE 0.9 % IV SOLN
0.0000 ug/min | INTRAVENOUS | Status: DC
  Administered 2016-11-30: 200 ug/min via INTRAVENOUS
  Administered 2016-11-30: 170 ug/min via INTRAVENOUS
  Filled 2016-11-30 (×3): qty 4

## 2016-11-30 MED ORDER — SODIUM CHLORIDE 0.9 % IV BOLUS (SEPSIS)
1000.0000 mL | Freq: Once | INTRAVENOUS | Status: AC
  Administered 2016-11-30: 1000 mL via INTRAVENOUS

## 2016-11-30 MED ORDER — MORPHINE SULFATE (PF) 4 MG/ML IV SOLN
1.0000 mg | INTRAVENOUS | Status: DC | PRN
  Administered 2016-11-30: 2 mg via INTRAVENOUS
  Administered 2016-11-30: 1 mg via INTRAVENOUS
  Administered 2016-11-30: 2 mg via INTRAVENOUS
  Filled 2016-11-30 (×3): qty 1

## 2016-11-30 MED ORDER — ONDANSETRON 4 MG PO TBDP: 4.0000 mg | ORAL_TABLET | Freq: Four times a day (QID) | ORAL | Status: DC | PRN

## 2016-11-30 MED ORDER — HALOPERIDOL 0.5 MG PO TABS
0.5000 mg | ORAL_TABLET | ORAL | Status: DC | PRN
  Filled 2016-11-30: qty 1

## 2016-11-30 MED ORDER — ACETAMINOPHEN 325 MG PO TABS: 650.0000 mg | ORAL_TABLET | Freq: Four times a day (QID) | ORAL | Status: DC | PRN

## 2016-11-30 MED ORDER — CHLORHEXIDINE GLUCONATE CLOTH 2 % EX PADS: 6.0000 | MEDICATED_PAD | Freq: Every day | CUTANEOUS | Status: DC

## 2016-11-30 MED ORDER — POLYVINYL ALCOHOL 1.4 % OP SOLN: 1.0000 [drp] | Freq: Four times a day (QID) | OPHTHALMIC | Status: DC | PRN

## 2016-11-30 MED ORDER — POTASSIUM CHLORIDE 10 MEQ/100ML IV SOLN
10.0000 meq | INTRAVENOUS | Status: AC
  Administered 2016-11-30: 10 meq via INTRAVENOUS
  Filled 2016-11-30: qty 100

## 2016-11-30 MED ORDER — SODIUM CHLORIDE 0.9% FLUSH
10.0000 mL | Freq: Two times a day (BID) | INTRAVENOUS | Status: DC
  Administered 2016-11-30 (×2): 10 mL

## 2016-11-30 MED ORDER — HALOPERIDOL LACTATE 2 MG/ML PO CONC
0.5000 mg | ORAL | Status: DC | PRN
  Filled 2016-11-30: qty 0.3

## 2016-11-30 MED ORDER — GLYCOPYRROLATE 0.2 MG/ML IJ SOLN
0.2000 mg | INTRAMUSCULAR | Status: AC
  Administered 2016-12-01: 0.2 mg via INTRAVENOUS
  Filled 2016-11-30: qty 1

## 2016-11-30 MED ORDER — DEXTROSE 5 % IV SOLN
1.0000 g | INTRAVENOUS | Status: DC
  Administered 2016-11-30: 1 g via INTRAVENOUS
  Filled 2016-11-30 (×2): qty 1

## 2016-11-30 MED ORDER — SODIUM CHLORIDE 0.9 % IV SOLN
Freq: Once | INTRAVENOUS | Status: AC
  Administered 2016-11-30: 05:00:00 via INTRAVENOUS

## 2016-11-30 MED ORDER — ACETAMINOPHEN 650 MG RE SUPP: 650.0000 mg | Freq: Four times a day (QID) | RECTAL | Status: DC | PRN

## 2016-11-30 MED ORDER — MORPHINE BOLUS VIA INFUSION
2.0000 mg | INTRAVENOUS | Status: DC | PRN
  Filled 2016-11-30: qty 5

## 2016-11-30 NOTE — Procedures (Signed)
Central Venous Catheter Insertion Procedure Note [name] [id number] 1927-12-19  Procedure: Insertion of Central Venous Catheter Indications: Assessment of intravascular volume, Drug and/or fluid administration and Frequent blood sampling  Procedure Details Consent: Risks of procedure as well as the alternatives and risks of each were explained to the (patient/caregiver).  Consent for procedure obtained. Time Out: Verified patient identification, verified procedure, site/side was marked, verified correct patient position, special equipment/implants available, medications/allergies/relevent history reviewed, required imaging and test results available.  Performed  Maximum sterile technique was used including antiseptics, cap, gloves, gown, hand hygiene, mask and sheet. Skin prep: Chlorhexidine; local anesthetic administered A antimicrobial bonded/coated triple lumen catheter was placed in the left internal jugular vein using the Seldinger technique.  Evaluation Blood flow good Complications: Complications of throughout procedure patient was hard to keep still, shaking head back and fourth, stating he couldn't breath. Drap was taken off by patient before dressing was placed.  Patient did not tolerate procedure well. Chest X-ray ordered to verify placement.  CXR: pending.  Jovita Kussmaul, AGACNP-BC [facility]  Pgr: [phone]  PCCM Pgr: [phone]

## 2016-11-30 NOTE — Progress Notes (Signed)
MD aware low UOP. Will continue to monitor.

## 2016-11-30 NOTE — Progress Notes (Signed)
eLink Physician-Brief Progress Note Patient Name: [name] DOB: 14-Nov-1927 MRN: [id number]   Date of Service  11/30/2016  HPI/Events of Note  Pt was made DNR earlier today. Pt now with audible pooling of secretions and loud breathing sounds. Family at bedside and requests further comfort measures.   eICU Interventions  Will initiate comfort measures only, ordered morphine drip. Family at bedside in agreement.      Intervention Category Major Interventions: Respiratory failure - evaluation and management  Shane Crutch 11/30/2016, 11:52 PM

## 2016-11-30 NOTE — Progress Notes (Addendum)
Dr. Mena Goes notified that urine continues to be dark red post previous irrigation by MD and has decreased in amount recently. Per MD, will return to eval this afternoon. Tube feeds turned off per MD instruction. Will continue to monitor.

## 2016-11-30 NOTE — Progress Notes (Signed)
STROKE TEAM PROGRESS NOTE   SUBJECTIVE (INTERVAL HISTORY) Urologist is at bedside. Pt continued to have hematuria and dropping BP and Hb. Got 2 units of PRBC transfusion this am. And SBP at 80s to 90s. CCM consulted again and Urologist would like to take him for IR embolization. Pt lethargic but still fully orientated.  OBJECTIVE Temp:  [97.5 F (36.4 C)-101.4 F (38.6 C)] 100.2 F (37.9 C) (06/05 1031) Pulse Rate:  [56-102] 94 (06/05 1100) Cardiac Rhythm: Atrial fibrillation (06/05 0000) Resp:  [15-37] 33 (06/05 1100) BP: (72-148)/(43-68) 90/56 (06/05 1100) SpO2:  [81 %-100 %] 81 % (06/05 1100) Weight:  [170 lb 3.1 oz (77.2 kg)] 170 lb 3.1 oz (77.2 kg) (06/05 0500)  CBC:   Recent Labs Lab 12/12/16 1302 11/24/16 0514  11/29/16 0532 11/29/16 1925 11/30/16 0226  WBC 6.0 8.2  < > 8.8  --  10.8*  NEUTROABS 4.3 6.5  --   --   --   --   HGB 9.6* 9.9*  < > 7.9* 7.6* 6.9*  HCT 30.6* 30.8*  < > 24.4*  --  21.6*  MCV 89.7 90.6  < > 95.7  --  97.7  PLT 151 154  < > 180  --  238  < > = values in this interval not displayed.  Basic Metabolic Panel:   Recent Labs Lab 11/25/16 1850 11/26/16 0508  11/29/16 0532 11/30/16 0202  NA  --   --   < > 151* 146*  K  --   --   < > 3.4* 3.8  CL  --   --   < > 121* 118*  CO2  --   --   < > 19* 19*  GLUCOSE  --   --   < > 190* 267*  BUN  --   --   < > 36* 42*  CREATININE  --   --   < > 2.23* 2.70*  CALCIUM  --   --   < > 8.0* 7.8*  MG 2.1 2.0  --   --  1.9  PHOS 3.4 3.6  --   --   --   < > = values in this interval not displayed.  Lipid Panel:     Component Value Date/Time   CHOL 115 11/24/2016 0514   TRIG 177 (H) 11/24/2016 0514   HDL 21 (L) 11/24/2016 0514   CHOLHDL 5.5 11/24/2016 0514   VLDL 35 11/24/2016 0514   LDLCALC 59 11/24/2016 0514   HgbA1c:  Lab Results  Component Value Date   HGBA1C 8.2 (H) 11/24/2016   Imaging I have personally reviewed the radiological images below and agree with the radiology  interpretations.  Ct Head Wo Contrast 12/12/2016 Pipeline stent placement in the right MCA. Contrast staining in regions of infarction affecting the right caudate and putamen. Low-density/infarction also evident in the sub insular brain without contrast staining. No evidence of new infarction. No significant mass effect. No demonstrable hemorrhage, though petechial bleeding could be hidden within the contrast staining.   [name] Head Wo Contrast 12/12/16 1. Acute infarct involving the right basal ganglia and small portion of the anteroinferior frontal lobe.  2. Distal right M1 MCA occlusion.  3. 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   Mr Brain Wo Contrast 11/24/2016 Acute multifocal RIGHT MCA territory and posterior watershed infarcts, further propagation from prior MRI.  Acute subcentimeter LEFT cerebellar infarct.  RIGHT basal ganglia petechial hemorrhage  without lobar hematoma.  Old hemorrhagic LEFT basal ganglia infarct and moderate to severe chronic small vessel ischemic disease.   DSA 11/25/2016 Status post endovascular complete revascularization of occluded right middle cerebral artery M1 segment with 3 passes of a 4 mm x 40 mm Solitaire FR retrieval device achieving a TICI 3 reperfusion with immediate occlusion of the vessel due to underlying severe segmental arteriosclerotic disease. Status post rescue stenting right middle cerebral artery due to recurrent occlusion despite retrieval thrombectomy attempts as described above, achieving a TICI 3 reperfusion.   Ct Head Code Stroke W/o Cm 11/11/2016 1. Hyperdense distal right M1/ MCA bifurcation with evidence of acute infarction in the right basal ganglia.  2. ASPECTS is at best 8.  3. No acute intracranial hemorrhage.   Transthoracic Echocardiogram 11/25/2016 - Left ventricle: The cavity size was normal. Wall thickness was   increased in a pattern of moderate LVH. Systolic function  was   normal. The estimated ejection fraction was in the range of 50%   to 55%. Images were inadequate for LV wall motion assessment. The   study is not technically sufficient to allow evaluation of LV   diastolic function. - Mitral valve: Mildly thickened leaflets . There was trivial   regurgitation. Valve area by pressure half-time: 1.25 cm^2. - Left atrium: Severely dilated. - Right atrium: The atrium was mildly dilated. - Tricuspid valve: There was moderate regurgitation. - Pulmonary arteries: PA peak pressure: 53 mm Hg (S). - Inferior vena cava: The vessel was normal in size. The   respirophasic diameter changes were in the normal range (>= 50%),   consistent with normal central venous pressure. Impressions: - Compared to a prior study in 2017, the LVEF is lower at 50-55%,   moderate LVH, a-fib is present. There is severe LAE and moderate   RAE, moderate TR, RVSP 53 mmHg, normal IVC size.  CUS 11/24/2016 The vertebral arteries appear patent with antegrade flow. - Findings consistent with a 1-39 percent stenosis involving the   right internal carotid artery and the left internal carotid   artery.  Ct Head Wo Contrast 11/28/2016 IMPRESSION: 1. Stable compared to brain MRI 11/24/2016. 2. Extensive right MCA territory infarction with mild hemorrhagic conversion (11 mm hematoma present in the putamen).    PHYSICAL EXAM  Temp:  [97.5 F (36.4 C)-101.4 F (38.6 C)] 100.2 F (37.9 C) (06/05 1031) Pulse Rate:  [56-102] 94 (06/05 1100) Resp:  [15-37] 33 (06/05 1100) BP: (72-148)/(43-68) 90/56 (06/05 1100) SpO2:  [81 %-100 %] 81 % (06/05 1100) Weight:  [170 lb 3.1 oz (77.2 kg)] 170 lb 3.1 oz (77.2 kg) (06/05 0500)  General - Well nourished, well developed, lethargic.  Ophthalmologic - Fundi not visualized due to noncooperation.  Cardiovascular - irregularly irregular heart rate and rhythm.  Mental Status -  Level of arousal and orientation to time, place, and person were  intact. Language including expression, naming, repetition, comprehension was assessed and found intact. Mild dysarthria.   Cranial Nerves II - XII - II - Visual field intact OU. III, IV, VI - right gaze preference but able to cross midline. V - Facial sensation intact bilaterally. VII - left facial droop. VIII - Hearing & vestibular intact bilaterally. X - Palate elevates symmetrically, mild dysarthria XI - Chin turning & shoulder shrug intact bilaterally. XII - Tongue protrusion intact.  Motor Strength - The patient's strength was purposeful and against gravity in RUE and RLE, but 0/5 LUE and 0/5 LLE.  Bulk was normal  and fasciculations were absent.   Motor Tone - Muscle tone was assessed at the neck and appendages and was increase at LUE.  Reflexes - The patient's reflexes were 1+ in all extremities and he had no pathological reflexes.  Sensory - Light touch, temperature/pinprick were assessed and were symmetrical.    Coordination - not cooperative on exam.  Tremor was absent.  Gait and Station - not tested.   ASSESSMENT/PLAN [name] is a 83 y.o. male with history of of AF not on AC due to recent GIB, HTN, CAD, OSA who woke with L hemiparesis, dysarthria and L neglect. He did not receive IV t-PA due to delay in arrival. CT and MRI showed hyperdense R MCA. Taken to IR where he received TICI3 revascularization following mechanical thrombectomy and rescue stent placement.   Stroke:   R MCA infarct with right M1 occlusion s/p TICI3 mechanical thrombectomy and rescue stent placement. Infarct likely due to afib not on AC. Additional posterior watershed infarct and L cerebellar infarct.  Code Stroke CT hyperdense R MCA with acute R BG infarct. Aspects 8.  Ltd MRI  Acte R BG and small part frontal lobe infarct.   MRA  Distal R M1 occlusion.  DSA occluded R MCA M1s/p rescue stent placed w/ resultant TICI3 reperfusion  MRI post IR - R MCA, posterior watershed and L  cerebellar infarcts. R BG petechial hemorrhage. Old L BG infarct. Moderate small vessel disease.   Carotid doppler no significant extracranial stenosis  2D Echo - EF 50-55% with atrial fibrillation noted.   LDL 59  HgbA1c 8.2  SCDs for VTE prophylaxis  Diet NPO time specified   No antithrombotic prior to admission, now on aspirin 325 mg daily and clopidogrel on hold.  Therapy recommendations:  CIR  Disposition:  pending   Hematuria   Urology on board, appreciate recs  Continue hematuria today with dropping Hb and BPs  plavix on hold  Plan to go to IR for embolization to stop bleeding  Anemia with shock  due to acute blood loss - 9.1->8.3->7.9->6.9  Received PRBC transfusion x 3 + 2  H&H q12h  SBP 80-90s  On IVF @ 75 and TF @ 55 with free water flush  CCM re-consulted  Atrial Fibrillation  Home anticoagulation:  none   Eliquis stopped 2 months ago d/t GIB  On ASA now, plavix on hold  Not candidate for [facility] currently due to large infarct and active bleeding with anemia and shock               Abnormal EEG  EKG over night showed wide complex beats  Cardiology considered afib with aberrancy, likely related to hypotension, anemia, shock  CCM re-consulted  Will consider cardiology consult if needed  Hypertension  BP goal normotensive  Stable, sometimes low  Avoid hypotension  Diabetes type II  HgbA1c 8.2, goal < 7.0  Uncontrolled  SSI  AKI - due to hypotension, anemia, dehydration  Cre 2.01->2.23 ->2.70  Na 149->151->146  Restart TF @ 50  Continue NS @ 75  Free water @ 10  BMP monitoring  Other Stroke Risk Factors  Advanced age  Former Cigarette smoker  Overweight, Body mass index is 28.32 kg/m.   CAD / MI  OSA, unable to use CPAP at home  Other Active Problems  Hx asymptomatic bradycardia  Hypokalemia (lasix 80 mg daily) - 3.6 -> 2.9 ->3.4 supplemented - recheck in AM  CKD stage III - Cre  2.18->2.01->2.33  Anemia  Hospital day # 7  This patient is critically ill due to right MCA infarct s/p IR and MCA stent, hematuria requiring PRBC transfusion, afib, anemia, and hypotension and at significant risk of neurological worsening, death form recurrent stroke, hemorrhagic transformation, shock, heart failure. This patient's care requires constant monitoring of vital signs, hemodynamics, respiratory and cardiac monitoring, review of multiple databases, neurological assessment, discussion with family, other specialists and medical decision making of high complexity. I spent 45 minutes of neurocritical care time in the care of this patient. I had discussed the case with Urology Dr. Mena Goes and CCM Dr. Jamison Neighbor.    Marvel Plan, MD PhD Stroke Neurology 11/30/2016 11:12 AM    To contact Stroke Continuity provider, please refer to [email or web address]. After hours, contact General Neurology

## 2016-11-30 NOTE — Progress Notes (Addendum)
Spoke with Nephrologist regarding order for urine testing. At this time, patient is having low, bloody urine and urology flushed catheter. Will hold on collection until able to get better sample. MD also made aware of plan not to pursue surgical intervention at this time and that patient's code status has changed to DNR.

## 2016-11-30 NOTE — Progress Notes (Signed)
Patient ID: [name], male   DOB: 1928/02/12, 83 y.o.   MRN: [id number]    Referring Physician(s): Dr. Karma Greaser  Supervising Physician: Malachy Moan  Patient Status: [facility] - In-pt  Chief Complaint: Hematuria secondary to enlarged prostate  Subjective: The patient is known to the [facility] service as he presented with an acute CVA on 5/29.  He underwent endovascular complete revascularization of occluded right middle cerebral artery M1 segment with 3 passes of a 4 mm x 40 mm Solitaire FR retrieval device achieving a TICI 3 reperfusion with immediate occlusion of the vessel due to underlying severe segmental arteriosclerotic disease.  Along with rescue stenting of the right MCA due to recurrent occlusion.  The patient post procedure was placed on plavix and ASA.  He has since developed significant hematuria secondary to an enlarged prostate.  Urology has been following the patient and doing bladder irrigations.  He has received a transfusion several days ago.  His hgb has drifted back down to 6.9 today and he is hypotensive and more lethargic per his wife.  We have been asked by urology to evaluate the patient for a possible prostate artery embolization.    Allergies: Exforge [amlodipine besylate-valsartan]; Lisinopril; and Niacin and related  Medications: Prior to Admission medications   Medication Sig Start Date End Date Taking? Authorizing Provider  Coenzyme Q10 (CO Q 10) 10 MG CAPS Take 1 tablet by mouth daily.    Yes [provider]  docusate sodium (COLACE) 100 MG capsule Take 1 capsule (100 mg total) by mouth 2 (two) times daily as needed (take to keep stool soft.). 04/28/16  Yes [name], MD  ferrous sulfate 325 (65 FE) MG tablet Take 325 mg by mouth daily with breakfast.   Yes [provider]  finasteride (PROSCAR) 5 MG tablet Take 1 tablet (5 mg total) by mouth daily. 11/09/16  Yes [name], MD  furosemide (LASIX) 40 MG tablet Take 40-80 mg by  mouth 2 (two) times daily. Take 2 tablets every am & 1 tablet every pm.    Yes [provider]  glimepiride (AMARYL) 1 MG tablet Take 1 mg by mouth daily with breakfast.  03/02/16 03/02/17 Yes [provider]  HUMALOG MIX 75/25 KWIKPEN (75-25) 100 UNIT/ML Kwikpen Inject 16 Units into the skin daily.  06/30/16  Yes [provider]  HYDROcodone-acetaminophen (NORCO/VICODIN) 5-325 MG tablet Take 1-2 tablets by mouth every 6 (six) hours as needed for moderate pain.   Yes [provider]  losartan (COZAAR) 100 MG tablet Take 100 mg by mouth daily. 03/31/16  Yes [provider]  Magnesium 250 MG TABS Take 250 mg by mouth daily.    Yes [provider]  metolazone (ZAROXOLYN) 5 MG tablet Take 5 mg by mouth daily as needed (for fluid). Reported on 12/15/2015   Yes [provider]  oxyCODONE (OXY IR/ROXICODONE) 5 MG immediate release tablet Take 5 mg by mouth every 4 (four) hours as needed for severe pain.   Yes [provider]  potassium chloride SA (K-DUR,KLOR-CON) 20 MEQ tablet Take 20 mEq by mouth daily.   Yes [provider]  tamsulosin (FLOMAX) 0.4 MG CAPS capsule Take 1 capsule (0.4 mg total) by mouth daily. 07/09/16  Yes [name], MD    Vital Signs: BP (!) 86/58   Pulse (!) 101   Temp 98.4 F (36.9 C) (Axillary)   Resp (!) 33   Ht 5\' 5"  (1.651 m)   Wt 170 lb 3.1  oz (77.2 kg)   SpO2 98%   BMI 28.32 kg/m   Physical Exam: Gen: lethargic, but does answer some questions Heart: irregular  CTAB: slight increase work of breathing GU: foley in place with hematuria present   Imaging: Ct Head Wo Contrast  Result Date: 11/28/2016 CLINICAL DATA:  Stroke.  Status post revascularization. EXAM: CT HEAD WITHOUT CONTRAST TECHNIQUE: Contiguous axial images were obtained from the base of the skull through the vertex without intravenous contrast. COMPARISON:  10/31/2016 FINDINGS: Brain: Cytotoxic edema in the right MCA territory  affecting the insula, operculum, and striatum. Infarct extent is unchanged from prior brain MRI. Small volume hemorrhagic conversion greatest in the putamen where there is an 11 mm hematoma, stable from prior brain MRI. Small area of right parietal and occipital cortex infarct, also stable. No new infarct noted. Swelling effaces the right lateral ventricle frontal horn and anterior body. No measurable midline shift. No entrapment. Old white matter infarct in the left frontal lobe. Vascular: Pipeline stent on the right M1 segment. No hyperdense vessel. Skull: No acute or aggressive finding Sinuses/Orbits: Chronic sinusitis with greatest opacification in the atelectatic left maxillary sinus. Bilateral cataract resection. No acute finding. IMPRESSION: 1. Stable compared to brain MRI 11/24/2016. 2. Extensive right MCA territory infarction with mild hemorrhagic conversion (11 mm hematoma present in the putamen). Electronically Signed   By: Marnee Spring M.D.   On: 11/28/2016 16:27   US Pelvis Limited  Result Date: 11/27/2016 CLINICAL DATA:  Gross hematuria, indwelling Foley catheter, history of recent stroke intervention of the right MCA. EXAM: LIMITED ULTRASOUND OF PELVIS TECHNIQUE: Limited transabdominal ultrasound examination of the pelvis was performed. COMPARISON:  05/18/2016 CT without contrast FINDINGS: Indwelling Foley catheter within the bladder. Bladder is decompressed but the wall does appear thickened. Heterogeneous complex echogenic abnormality within the bladder lumen measures 4.6 x 4 x 5.0 cm consistent with bladder mass or intraluminal clot. This remains nonspecific by ultrasound. IMPRESSION: Ill-defined intraluminal echogenic masslike abnormality compatible with bladder mass or clot. Electronically Signed   By: Judie Petit.  Shick M.D.   On: 11/27/2016 11:09   Dg Chest Port 1 View  Result Date: 11/30/2016 CLINICAL DATA:  83 year old male with weakness. Cardiomyopathy. Subsequent encounter. EXAM: PORTABLE  CHEST 1 VIEW COMPARISON:  11/01/2016. FINDINGS: Endotracheal tube has been removed. Feeding tube courses below diaphragm, tip not imaged. Cardiomegaly. Calcified tortuous aorta. Pulmonary vascular prominence most notable centrally. No segmental consolidation or pneumothorax. Probable confluent she shadows right upper lobe. Attention to this on follow-up. With the degree of central pulmonary vascular prominence and tortuous aorta, limited for detecting mediastinal adenopathy or mass. IMPRESSION: Cardiomegaly. Pulmonary vascular prominence most notable centrally. No segmental consolidation. Calcified slightly tortuous aorta. Probable confluence of shadows right upper lobe. Attention to this on follow-up. Electronically Signed   By: Lacy Duverney M.D.   On: 11/30/2016 07:34    Labs:  CBC:  Recent Labs  11/27/16 0610 11/27/16 1814 11/28/16 0629 11/28/16 1618 11/29/16 0532 11/29/16 1925 11/30/16 0226  WBC 13.4*  --  9.9  --  8.8  --  10.8*  HGB 8.1* 9.1* 8.3* 8.2* 7.9* 7.6* 6.9*  HCT 23.9* 27.6* 25.7*  --  24.4*  --  21.6*  PLT 162  --  171  --  180  --  238    COAGS:  Recent Labs  04/12/16 1123 11/11/2016 0717 11/24/16 1824  INR 1.13 1.10 1.11  APTT 34 32 22*    BMP:  Recent Labs  11/25/16 0300 11/28/16  3846 11/29/16 0532 11/30/16 0202  NA 141 149* 151* 146*  K 3.6 2.9* 3.4* 3.8  CL 113* 122* 121* 118*  CO2 15* 18* 19* 19*  GLUCOSE 196* 154* 190* 267*  BUN 27* 41* 36* 42*  CALCIUM 7.5* 7.7* 8.0* 7.8*  CREATININE 2.18* 2.01* 2.23* 2.70*  GFRNONAA 26* 28* 25* 20*  GFRAA 30* 33* 29* 23*    LIVER FUNCTION TESTS:  Recent Labs  04/12/16 1123 11/24/2016 0717  BILITOT 1.4* 0.8  AST 26 42*  ALT 12* 36  ALKPHOS 74 131*  PROT 7.9 7.7  ALBUMIN 3.3* 3.4*    Assessment and Plan: 1. Hematuria  2. S/p endovascular revascularization and stent placement from a CVA 3. ABL anemia secondary to #1 4. Hypotension, secondary to #3 5. ARF, Cr 2.7  Unfortunately the patient is  having gross hematuria secondary to BPH and plavix that the patient was placed on after his endovascular treatment.  His plavix is crucial to his stent as without it he may develop intrastent thrombus and cause worsening CVA or death.  However, the patient is continuing to bleed from his prostate requiring multiple transfusions and is now hypotensive.  The plavix has been held and his bleeding is less, but he has still falled to 6.9 today for his hgb and requiring another transfusion.  Please see urologies note regarding surgical options and why they are not feasible currently.  We have been asked to see if the patient would be a candidate for PAE.  The patient's creatinine is 2.7 today.  It was 1.8 on admission.  Dr. Archer Asa has spoken to Dr. Eliott Nine with nephrology to determine whether an embolization and contrast dye load would even be feasible for this patient.  Given the anatomy can be variable among different patient's, a large dye load is generally required for a PAE, and therefore, for this patient who already has a creatinine of 2.7 makes him very high risk for renal failure or possibly hemodialysis.  This has been thoroughly discussed with the patient's wife, daughter, Wynona Canes, and son in Social worker, Arlys John.  The patient has a living will and Arlys John made it clear that for now they would like to do watchful waiting and DO NOT want to pursue PAE as the patient would not want to survive with a life changing condition such as dialysis.  I think this is very reasonable.  We will continue to follow and see how the patient does.  Knowing the patient has a living will etc, it may be a good time to have a GOC discussion with the family to assist in making decisions going forward.    Electronically Signed: Letha Cape 11/30/2016, 12:55 PM   I spent a total of 35 Minutes at the the patient's bedside AND on the patient's hospital floor or unit, greater than 50% of which was counseling/coordinating care for  hematuria

## 2016-11-30 NOTE — Progress Notes (Signed)
Pharmacy Antibiotic Note  [name] is a 83 y.o. male admitted on 11/22/2016 with sepsis.  Pharmacy has been consulted for vancomycin and ceftazidime dosing. Tmax is 101.4 and WBC is mildly elevated at 10.8. SCr is increasing to 2.7.   Plan: Vancomycin 1gm IV Q48H Ceftazidime 1gm IV Q24H F/u renal fxn, C&S, clinical status and trough at SS  Height: 5\' 5"  (165.1 cm) Weight: 170 lb 3.1 oz (77.2 kg) IBW/kg (Calculated) : 61.5  Temp (24hrs), Avg:98.9 F (37.2 C), Min:97.5 F (36.4 C), Max:101.4 F (38.6 C)   Recent Labs Lab 11/24/16 0514 11/25/16 0300 11/26/16 2304 11/27/16 0610 11/28/16 0629 11/29/16 0532 11/30/16 0202 11/30/16 0226  WBC 8.2 12.2* 11.5* 13.4* 9.9 8.8  --  10.8*  CREATININE 2.03* 2.18*  --   --  2.01* 2.23* 2.70*  --     Estimated Creatinine Clearance: 18.5 mL/min (A) (by C-G formula based on SCr of 2.7 mg/dL (H)).    Allergies  Allergen Reactions  . Exforge [Amlodipine Besylate-Valsartan] Swelling and Other (See Comments)    Pt states that he is unable to swallow.    . Lisinopril Swelling and Other (See Comments)    Pt states that he is unable to swallow.    . Niacin And Related Swelling and Other (See Comments)    Pt states that he is unable to swallow.      Antimicrobials this admission: Vanc 6/5>> Ceftaz 6/5>>  Dose adjustments this admission: N/A  Microbiology results: Pending  Thank you for allowing pharmacy to be a part of this patient's care.  Siddharth Babington, Drake Leach 11/30/2016 10:57 AM

## 2016-11-30 NOTE — Progress Notes (Signed)
SLP Cancellation Note  Patient Details Name: [name] MRN: [id number] DOB: [birth year]   Cancelled treatment:       Reason Eval/Treat Not Completed: Medical issues which prohibited therapy - per chart review note additional events of the day and TF now on hold. Discussed with RN - will hold PO trials for today. Will f/u on next date for medical appropriateness.   [name] 11/30/2016, 3:22 PM  [name], M.A. CCC-SLP [phone]

## 2016-11-30 NOTE — Progress Notes (Signed)
PT Cancellation Note  Patient Details Name: [name] MRN: [id number] DOB: 1928-06-01   Cancelled Treatment:    Reason Eval/Treat Not Completed: Medical issues which prohibited therapy.  Noted issues overnight with drop in Hgb to 6.9, hypotension.  Will return at later date for PT session.   Vena Austria 11/30/2016, 11:41 AM Durenda Hurt. Renaldo Fiddler, [facility] Acute Rehab Services Pager [phone]

## 2016-11-30 NOTE — Progress Notes (Addendum)
  A/P: 1) gross hematuria - Again, I pushed balloon out of prostate and showed nurse how to keep foley off traction. I hand irrigated the catheter and it cleared quickly. I wouldn't take [name] to OR urgently for the amount of hematuria I see on hand irrigation, he needs resuscitation now and is getting tube feeds. Will check later today. I'll also repeat a bladder u/s to ensure no large clots. Discussed with [name] wife at bedside.  I spoke with Dr. Marlyne Beards and he feels the low blood pressure may be multifactorial and not all related to bleeding. He's planing to get a line in patient and start some pressors and get cultures. I discussed with Dr. Roda Shutters - [name] stable off plavix for now.  I also explored PAE and I discussed with Dr. Archer Asa, but with elevated Cr he's at risk for CIN. IR will consult and discuss with nephrology (this was done and embolization carries high risk for HD). I also discussed with anesthesia and he would likely need an intubation with general anesthesia to control the airway.   I called son-in-law, Arlys John who conferenced in daughter. Updated on above conversations. Again, [name] not stable now for any intervention.  If prostate bleeding is the issue, we discussed nature r/b of surveillance, PAE or cysto/clot evac/TURP. After a cysto possible TURP/TURBT one would expect the level of bleeding he has now, so the anticoagulation can be an issue even post-op. They will consider.    2) BPH- about a 170 g prostate on CT. Continue finasteride   3) Urinary retention - continue foley. Void trial when patient stable and / or ambulatory.   Addendum: I spent about 30 minutes in direct patient care and about 120 minutes in coordinating care for this patient today.   S: [name] without complaints. Wants ice chips. It appears he started bleeding again last night. Hgb 6.9 this AM, [name] has been hypotensive.   O: Vitals:   11/30/16 0921 11/30/16 0925  BP: (!) 77/48   Pulse:    Resp: (!) 31   Temp: 98.2 F  (36.8 C) 99.2 F (37.3 C)   PE: Sleeping but arousable  Abd - soft, NT, ND GU: urine dark red, foley found to be back on traction, not secured to [name].   CBC    Component Value Date/Time   WBC 10.8 (H) 11/30/2016 0226   RBC 2.21 (L) 11/30/2016 0226   HGB 6.9 (LL) 11/30/2016 0226   HGB 8.8 (L) 07/08/2013 0431   HCT 21.6 (L) 11/30/2016 0226   HCT 30.4 (L) 03/12/2015 1442   PLT 238 11/30/2016 0226   PLT 264 03/12/2015 1442   MCV 97.7 11/30/2016 0226   MCV 87 03/12/2015 1442   MCV 88 07/08/2013 0431   MCH 31.2 11/30/2016 0226   MCHC 31.9 11/30/2016 0226   RDW 17.5 (H) 11/30/2016 0226   RDW 22.1 (H) 03/12/2015 1442   RDW 14.3 07/08/2013 0431   LYMPHSABS 1.1 11/24/2016 0514   LYMPHSABS 1.7 03/12/2015 1442   LYMPHSABS 1.6 07/08/2013 0431   MONOABS 0.5 11/24/2016 0514   MONOABS 1.5 (H) 07/08/2013 0431   EOSABS 0.1 11/24/2016 0514   EOSABS 0.3 03/12/2015 1442   EOSABS 0.3 07/08/2013 0431   BASOSABS 0.0 11/24/2016 0514   BASOSABS 0.0 03/12/2015 1442   BASOSABS 0.1 07/08/2013 0431

## 2016-11-30 NOTE — Progress Notes (Signed)
EKG changes noted by nursing. Runs of wide complex beats. The patient is asymptomatic, no drop in pressure and no chest pain. I asked Dr. Mayford Knife to review EKGs and she feels that this represents afib with aberrancy.   I will order stat BMP, Mg, Phos and replete as needed. Could consider formal Cardiology consult in the AM.   Ritta Slot, MD [facility] [phone]  If 7pm- 7am, please page neurology on call as listed in AMION.

## 2016-11-30 NOTE — Progress Notes (Signed)
eLink Physician-Brief Progress Note Patient Name: [name] DOB: 1928-01-07 MRN: [id number]   Date of Service  11/30/2016  HPI/Events of Note  Tropomin > 65.0. EKG from 2 AM last evening:  Critical Test Result: STEMI Atrial fibrillation. ST elevation consider inferior injury or acute infarct **  ACUTE MI / STEMI  ** Consider right ventricular involvement in acute inferior infarct  eICU Interventions  Will order: 1. Ground team to contact Cardiology for consultation.      Intervention Category Intermediate Interventions: Diagnostic test evaluation  Lenell Antu 11/30/2016, 4:35 PM

## 2016-11-30 NOTE — Progress Notes (Signed)
Neurology Cross-Cover Note  Called by PCCM NP Janyth Contes with update. Patient with elevated troponin this afternoon, now >65. EKG showed acute STEMI. Chart reviewed.   Patient initially presented with R MCA infarct and was taken to IR for thrombectomy and rescue stent placement on 11-25-16 with resultant TICI3 flow. Follow-up imaging showed a sizable area of ischemic infarction in the R MCA territory involving the R frontal lobe, insula, and insula with a small amount of hemorrhagic conversion within the infarct on CTH from 11/28/16. He has had persistent dysarthria, right gaze preference, left facial droop, and dense left hemiparesis as a result of his stroke. He was placed on Plavix following placement of R MCA stent.   His course has been complicated by severe hematuria due to prostate hemorrhage. This has required blood transfusion. He is being followed by urology but is not a candidate for surgery. Embolization of the prostate artery was considered and IR was consulted but this would require a significant amount of contrast. He is already demonstrating acute kidney injury with worsening renal function and exposure to this degree of contrast would result in an almost definite need for hemodialysis. After discussion between urology, IR, nephrology, the patient and his family, the decision was made not to proceed with embolization as he would not want dialysis.   Per nephrology consult note today, his AKI is superimposed on baseline CKD4 and is felt to be the result of contrast, ARB administration, hypotension, and possible sepsis. She discussed CRRT and HD with the patient and his family but they indicated that they would not want longterm treatment.   This afternoon labs showed a troponin >65 with EKG showing STEMI. He was seen by PCCM and noted to have gurgling respirations and progressive hypotension requiring phenylephrine for support. They consulted cardiology for further recommendations. He has  developed tachypnea with evidence of pulmonary congestion on CXR. They spoke with the patient and his family. The patient indicated that he would not want to be intubated and the patient was made DNR/DNI after this discussion. Continue medical management and supportive care were desired with transition to comfort measures if he continues to decline further.   He was seen in consultation by Dr. Katrinka Blazing and I have reviewed his note. He indicates that his MI is likely at least 24 hours old. He is not a candidate for aggressive or invasive treatment due to his other issues. He recommends conservative management with antiplatelet tx, IVF for blood pressure support given concern that hypotension may be more related to RV infarct. Prognosis is poor from cardiac status alone and is much worse when factoring in his other medical issues and his age. A predominantly palliative approach was recommended.   I appreciate the assistance of all consultants with Mr. Goblirsch care. I agree that a palliative approach is best at this time. I spoke with the patient's daughter and son-in-law on the unit. They understand the patient's condition and his grim prognosis. They are understandably tearful. They would like to continue with current medical support but feel that if he continues to worsen or if he begins to experience increasing pain and discomfort, they would like to transition to comfort measures. They were given the chance to ask any questions and these were addressed to their satisfaction.   Plan: 1. Palliative care consultation 2. Morphine PRN--patient beginning to complain of some substernal chest pain 3. Anticipate transition to full comfort measures soon

## 2016-11-30 NOTE — Progress Notes (Addendum)
Inpatient Diabetes Program Recommendations  AACE/ADA: New Consensus Statement on Inpatient Glycemic Control (2015)  Target Ranges:  Prepandial:   less than 140 mg/dL      Peak postprandial:   less than 180 mg/dL (1-2 hours)      Critically ill patients:  140 - 180 mg/dL   Results for LAMOUNT, POPA (MRN [id number]) as of 11/30/2016 09:31  Ref. Range 11/29/2016 07:48 11/29/2016 11:47 11/29/2016 15:40 11/29/2016 19:59 11/29/2016 23:36 11/30/2016 03:54 11/30/2016 08:13  Glucose-Capillary Latest Ref Range: 65 - 99 mg/dL 403 (H) 754 (H) 360 (H) 195 (H) 252 (H) 286 (H) 231 (H)   Review of Glycemic Control  Diabetes history: DM2 Outpatient Diabetes medications: Amaryl 1 mg QAM, Humalog 75/25 16 units daily Current orders for Inpatient glycemic control: Novolog 0-15 units Q4H  Inpatient Diabetes Program Recommendations: Insulin - Basal: Please consider ordering Levemir 8 units Q24H. Insulin -Tube Feeding Coverage: While ordered tube feeding, please consider ordering Novolog 3 units Q4H for tube feeding coverage.  Thanks, Orlando Penner, RN, MSN, CDE Diabetes Coordinator Inpatient Diabetes Program [phone] (Team Pager from 8am to 5pm)

## 2016-11-30 NOTE — Progress Notes (Addendum)
[facility] Attending Note  ADMISSION DATE:  11/19/2016  CONSULTATION DATE:  11/21/2016  CHIEF COMPLAINT:  CVA  Presenting HPI:  83 y.o. male with PMH as below, which is significant for Afib not on anticoagulation (stopped recently due to GI bleed), OSA not on CPAP, Cardiomyopathy, CAD, and DM. 5/28 he presented to [facility] ED with complaints of L sided weakness with L sided facial droop, dysarthria, and left hemineglect. Last seen normal 2330 the prior evening. Upon awakening at 6AM he was noted to have the aforementioned deficits. Code stroke was immediately called in the ED. Intracranial imaging demonstrated acute CVA and the patient was taken to IR for endovascular procedure. Underwent thrombectomy by Dr. Estanislado Pandy and was sent to ICU for recovery on ventilator.   Subjective:  Patient with more bleeding and borderline blood pressure overnight. Denies any pain or difficulty breathing.   Review of Systems:  Patient drowsy and unable to provide further history.   Temp:  [97.5 F (36.4 C)-101.4 F (38.6 C)] 101.4 F (38.6 C) (06/05 0800) Pulse Rate:  [53-102] 102 (06/05 0700) Resp:  [15-36] 36 (06/05 0900) BP: (85-148)/(45-68) 87/68 (06/05 0900) SpO2:  [94 %-100 %] 100 % (06/05 0700) Weight:  [170 lb 3.1 oz (77.2 kg)] 170 lb 3.1 oz (77.2 kg) (06/05 0500)   General:  Wife at bedside. No distress. Integument:  Warm & dry. No rash on exposed skin. HEENT:  Moist mucus memebranes. No scleral icterus.  Neurological:  Following commands. Moves right side on command but only minimal movement of left foot. Musculoskeletal:  No joint effusion or erythema appreciated. Symmetric muscle bulk. Pulmonary:  Mildly increased work of breathing on room air. Clear bilaterally to auscultation. Cardiovascular:  Regular rate. No appreciable JVD. Normal S1 & S2. Abdomen:  Soft. Nondistended. Normoactive bowel sounds.  LINES/TUBES: OETT 5/29 - 5/30 Art Line 5/29 - 5/30 3-Way Foley  5/30 >>> NGT >>> PIV  CBC Latest Ref Rng & Units 11/30/2016 11/29/2016 11/29/2016  WBC 4.0 - 10.5 K/uL 10.8(H) - 8.8  Hemoglobin 13.0 - 17.0 g/dL 6.9(LL) 7.6(L) 7.9(L)  Hematocrit 39.0 - 52.0 % 21.6(L) - 24.4(L)  Platelets 150 - 400 K/uL 238 - 180    BMP Latest Ref Rng & Units 11/30/2016 11/29/2016 11/28/2016  Glucose 65 - 99 mg/dL 267(H) 190(H) 154(H)  BUN 6 - 20 mg/dL 42(H) 36(H) 41(H)  Creatinine 0.61 - 1.24 mg/dL 2.70(H) 2.23(H) 2.01(H)  BUN/Creat Ratio 10 - 24 - - -  Sodium 135 - 145 mmol/L 146(H) 151(H) 149(H)  Potassium 3.5 - 5.1 mmol/L 3.8 3.4(L) 2.9(L)  Chloride 101 - 111 mmol/L 118(H) 121(H) 122(H)  CO2 22 - 32 mmol/L 19(L) 19(L) 18(L)  Calcium 8.9 - 10.3 mg/dL 7.8(L) 8.0(L) 7.7(L)   Hepatic Function Latest Ref Rng & Units 11/14/2016 04/12/2016 11/22/2015  Total Protein 6.5 - 8.1 g/dL 7.7 7.9 7.0  Albumin 3.5 - 5.0 g/dL 3.4(L) 3.3(L) 3.0(L)  AST 15 - 41 U/L 42(H) 26 31  ALT 17 - 63 U/L 36 12(L) 16(L)  Alk Phosphatase 38 - 126 U/L 131(H) 74 63  Total Bilirubin 0.3 - 1.2 mg/dL 0.8 1.4(H) 0.8    IMAGING/STUDIES: MRI BRAIN 5/30:  acute R MCA CVA and watershed infarcts, acute L cerebellar CVA, R BG petechial hemorrhage without hematoma.  TTE 5/31:  EF 50-55% (slightly lower than 2017), moderate LVH, severe left atrial enlargement and moderate right atrial enlargement, RVSP 53 mmHg CT HEAD W/O 6/3: IMPRESSION: 1. Stable compared to brain MRI  11/24/2016. 2. Extensive right MCA territory infarction with mild hemorrhagic conversion (11 mm hematoma present in the putamen). PORT CXR 6/5:  Mild bilateral interstitial prominence. No focal opacity. No pleural effusion appreciated. Enteric feeding tube coursing below diaphragm.  MICROBIOLOGY: MRSA PCR 5/29:  Negative   ANTIBIOTICS: Ancef 5/29 (periop)  SIGNIFICANT EVENTS: 05/29 - Admit w/ CVA >> Neuro IR & transferred out on Ventilator 06/01 - Signed off 06/05 - Called back w/ ongoing bleeding & borderline blood  pressure  ASSESSMENT/PLAN:  83 y.o. male admitted with acute CVA. Now with gross hematuria and active blood loss. Neurology following. Neurology following given admission for acute CVA. Fever possibly secondary to an acute infectious process.  1. Hypotension: Suspect secondary to hypovolemia. Checking serum cortisol. Normal saline 1 L bolus. Low-dose peripheral Neo-Synephrine while blood product transfusion continues. Holding Lasix. 2. Anemia: Secondary to genitourinary bleeding. Finishing second unit packed red blood cells. Checking stat coags. 3. Genitourinary bleeding: Three-way Foley in place. Management per Urology. 4. FUO/Possible Sepsis:  Checking Procalcitonin per algorithm. Also checking blood and urine cultures as well as urinalysis. Starting empiric Vancomycin & Tressie Ellis.  5. Diabetes mellitus: Glucose elevated. Continuing Accu-Cheks every 4 hours with moderate dose sliding scale insulin per algorithm. 6. Acute CVA: Management per neurology. 7. History of atrial fibrillation: Continuous telemetry monitoring. Holding anticoagulation given ongoing bleeding. 8. Recent gastrointestinal bleeding: No signs of active bleeding. Continuing Protonix IV daily. 9. Acute renal failure: Avoiding nephrotoxic agents. Trending urine output. Monitoring electrolytes daily. 10. Hypernatremia: Improving. Mild. Continuing free water via NG tube.  Prophylaxis:  SCDs & Protonix IV daily.  Diet:  Continuing Tube Feedings. NPO.  Code Status:  Full Code per previous physician discussions. Disposition:  As per primary service & consultant.  Family Update:  Patient and wife updated at bedside at the time of my exam.   I have personally spent a total of 32 minutes of critical care time today caring for the patient, updating patient and wife at bedside, & reviewing the patient's electronic medical record.  Remainder of care as per primary service and other consultants.   Sonia Baller Ashok Cordia, M.D. [facility]  Pulmonary & Critical Care Pager:  [phone] After 3pm or if no response, call [phone] 9:21 AM 11/30/16

## 2016-11-30 NOTE — Progress Notes (Signed)
PCCM Acute Assessment  Asked to see pt with recent CVA.  Developed hematuria.  Now with STEMI.  Having progressive hypotension.  Gurgling respirations.  BP 94/70   Pulse 94   Temp 99 F (37.2 C) (Oral)   Resp (!) 31   Ht 5\' 5"  (1.651 m)   Wt 170 lb 3.1 oz (77.2 kg)   SpO2 98%   BMI 28.32 kg/m   General - ill appearing Eyes - pupils reactive ENT - gurgling respirations Cardiac - regular, no murmur Chest - b/l crackles Abd - soft, non tender Ext - no edema Skin - no rashes Neuro - normal strength Psych - normal mood  CMP Latest Ref Rng & Units 11/30/2016 11/30/2016 11/29/2016  Glucose 65 - 99 mg/dL 409(W) 119(J) 478(G)  BUN 6 - 20 mg/dL 95(A) 21(H) 08(M)  Creatinine 0.61 - 1.24 mg/dL 5.78(I) 6.96(E) 9.52(W)  Sodium 135 - 145 mmol/L 149(H) 146(H) 151(H)  Potassium 3.5 - 5.1 mmol/L 4.1 3.8 3.4(L)  Chloride 101 - 111 mmol/L 125(H) 118(H) 121(H)  CO2 22 - 32 mmol/L 16(L) 19(L) 19(L)  Calcium 8.9 - 10.3 mg/dL 7.3(L) 7.8(L) 8.0(L)  Total Protein 6.5 - 8.1 g/dL - - -  Total Bilirubin 0.3 - 1.2 mg/dL - - -  Alkaline Phos 38 - 126 U/L - - -  AST 15 - 41 U/L - - -  ALT 17 - 63 U/L - - -    CBC Latest Ref Rng & Units 11/30/2016 11/30/2016 11/29/2016  WBC 4.0 - 10.5 K/uL 11.6(H) 10.8(H) -  Hemoglobin 13.0 - 17.0 g/dL 7.9(L) 6.9(LL) 7.6(L)  Hematocrit 39.0 - 52.0 % 24.8(L) 21.6(L) -  Platelets 150 - 400 K/uL 204 238 -    US Pelvis Limited  Result Date: 11/30/2016 CLINICAL DATA:  Gross hematuria for 1 week EXAM: ULTRASOUND OF URINARY BLADDER TECHNIQUE: Transverse and longitudinal images of the urinary bladder obtained. COMPARISON:  None. FINDINGS: Foley catheter is position within the urinary bladder. There is diffuse inhomogeneous material throughout the urinary bladder. The urinary bladder wall does not appear significantly thickened. IMPRESSION: Extensive apparent debris and likely hemorrhage throughout the urinary bladder. An underlying mass in the bladder cannot be excluded. Urinary  bladder wall does not appear appreciably thickened. Foley catheter is position within the urinary bladder. Given this extensive inflammation and potential hemorrhage, it may be prudent to consider direct visualization for further assessment. Electronically Signed   By: Bretta Bang III M.D.   On: 11/30/2016 13:40   Dg Chest Port 1 View  Result Date: 11/30/2016 CLINICAL DATA:  Central line placement EXAM: PORTABLE CHEST 1 VIEW COMPARISON:  11/30/2016 . FINDINGS: Interim placement of left IJ line, its tip is projected as the left brachycephalic vein. Feeding tube noted with tip below left hemidiaphragm. Stable cardiomegaly with pulmonary venous congestion. Mild right base atelectasis. No pleural effusion or pneumothorax. IMPRESSION: 1. Left IJ line noted over the left brachycephalic vein. Feeding tube noted with tip below left hemidiaphragm. 2. Cardiomegaly with pulmonary venous congestion. Mild right base atelectasis. Electronically Signed   By: Maisie Fus  Register   On: 11/30/2016 14:47   Dg Chest Port 1 View  Result Date: 11/30/2016 CLINICAL DATA:  83 year old male with weakness. Cardiomyopathy. Subsequent encounter. EXAM: PORTABLE CHEST 1 VIEW COMPARISON:  10/28/2016. FINDINGS: Endotracheal tube has been removed. Feeding tube courses below diaphragm, tip not imaged. Cardiomegaly. Calcified tortuous aorta. Pulmonary vascular prominence most notable centrally. No segmental consolidation or pneumothorax. Probable confluent she shadows right upper lobe. Attention to this  on follow-up. With the degree of central pulmonary vascular prominence and tortuous aorta, limited for detecting mediastinal adenopathy or mass. IMPRESSION: Cardiomegaly. Pulmonary vascular prominence most notable centrally. No segmental consolidation. Calcified slightly tortuous aorta. Probable confluence of shadows right upper lobe. Attention to this on follow-up. Electronically Signed   By: Lacy Duverney M.D.   On: 11/30/2016 07:34     Assessment: CVA STEMI Acute renal failure Hematuria Sepsis with probable HCAP  Plan: Not candidate for surgical intervention for bladder due to risk D/w pt and family - in agreement for DNR/DNI status Continue medical management otherwise Cardiology consult pending If medical status gets worse, then transition to comfort care  D/w Dr. Mena Goes

## 2016-11-30 NOTE — Consult Note (Addendum)
Cardiology Consultation:   Patient ID: [name]; [id number]; 07-07-1927   Admit date: 11/17/2016 Date of Consult: 11/30/2016  Primary Care Provider: Kirk Ruths, MD Primary Cardiologist: Ida Rogue Primary Electrophysiologist:  None   Patient Profile:   [name] is a 83 y.o. male with a hx of recent embolic right middle cerebral artery stroke treated with intracranial mechanical revascularization 10/26/2016, essential hypertension, recent history of GI bleed requiring cessation of anticoagulation therapy 3 months ago, history of chronic atrial fibrillation, myocardial infarction in 1996 treated with PTCA, in hospital hematuria on aspirin and Plavix, history of ischemic cardiomyopathy who is being seen today for the evaluation of elevated troponin consistent with large ST elevation myocardial infarction at the request of Chesley Mires, M.D. He has been declared DO NOT RESUSCITATE DO NOT INTUBATE  History of Present Illness:   [name] is a pleasant 83 year old gentleman admitted to the hospital with acute right middle cerebral artery occlusion felt to be embolic and treated with Code Stroke protocol and subsequent mechanical reperfusion and ultimate stenting on 11/16/2016. Since that time, hospital course has been complicated by urinary bleeding on Plavix and aspirin, and residual impact of the stroke with left-sided hemiparesis and some dysarthria. Early this morning he began experiencing ventricular ectopy on monitor and investigation included blood work, cardiac markers, and 12-lead electrocardiogram. Ultimately, troponin returned greater than 65, and 12-lead EKG demonstrated widespread evidence of ischemia with inferior ST elevation and diffuse precordial ST depression. He has subsequently developed hypotension requiring Neo-Synephrine infusion. He has become tachypnea and chest x-ray reveals cardiomegaly and pulmonary venous congestion. He denies chest discomfort at  any point over the past 48 hours. He is currently pain-free and admits to only mild dyspnea. He is coherent and able to engage in conversation.  Past Medical History:  Diagnosis Date  . Anemia   . BPH (benign prostatic hypertrophy)   . Cardiomyopathy ([place])   . Coronary artery disease   . Diverticulosis   . Essential hypertension, benign   . GI bleed 2015  . History of kidney stones   . Hypersomnia with sleep apnea, unspecified   . MI (myocardial infarction) ([place]) 1996  . Occasional tremors   . Other and unspecified hyperlipidemia   . Pure hypercholesterolemia   . Sleep apnea    unable to use C-PAP  . Type II or unspecified type diabetes mellitus without mention of complication, uncontrolled     Past Surgical History:  Procedure Laterality Date  . CARDIAC CATHETERIZATION    . CHOLECYSTECTOMY  1994  . CORONARY ANGIOPLASTY  1996   [place]  . CYSTOSCOPY W/ URETERAL STENT PLACEMENT Left 05/31/2016   Procedure: CYSTOSCOPY WITH STENT REPLACEMENT;  Surgeon: Hollice Espy, MD;  Location: [facility];  Service: Urology;  Laterality: Left;  . CYSTOSCOPY WITH LITHOLAPAXY N/A 05/11/2016   Procedure: CYSTOSCOPY WITH LITHOLAPAXY;  Surgeon: Hollice Espy, MD;  Location: [facility];  Service: Urology;  Laterality: N/A;  . CYSTOSCOPY/URETEROSCOPY/HOLMIUM LASER/STENT PLACEMENT Left 05/11/2016   Procedure: CYSTOSCOPY/URETEROSCOPY/HOLMIUM LASER/STENT PLACEMENT;  Surgeon: Hollice Espy, MD;  Location: [facility];  Service: Urology;  Laterality: Left;  . ESOPHAGOGASTRODUODENOSCOPY (EGD) WITH PROPOFOL Left 11/23/2015   Procedure: ESOPHAGOGASTRODUODENOSCOPY (EGD) WITH PROPOFOL;  Surgeon: Fredonia Highland, MD;  Location: [facility];  Service: Endoscopy;  Laterality: Left;  . EYE SURGERY Bilateral    Cataract Extraction with IOL   . FLEXIBLE SIGMOIDOSCOPY    . HERNIA REPAIR Left    Inguinal Hernia Repair  . inguinal hernia repair  left inguinal   . INSERTION OF SUPRAPUBIC CATHETER N/A 05/31/2016    Procedure: INSERTION OF SUPRAPUBIC CATHETER;  Surgeon: Hollice Espy, MD;  Location: [facility];  Service: Urology;  Laterality: N/A;  . IR GENERIC HISTORICAL  04/12/2016   IR NEPHROSTOMY PLACEMENT LEFT 04/12/2016 Sandi Mariscal, MD [facility]  . IR PERCUTANEOUS ART THROMBECTOMY/INFUSION INTRACRANIAL INC DIAG ANGIO  11/11/2016  . KNEE ARTHROSCOPY Left   . NEPHROSTOMY  05/11/2016   Procedure: NEPHROSTOMY tude removal;  Surgeon: Hollice Espy, MD;  Location: [facility];  Service: Urology;;  . RADIOLOGY WITH ANESTHESIA N/A 11/24/2016   Procedure: RADIOLOGY WITH ANESTHESIA;  Surgeon: Luanne Bras, MD;  Location: [place];  Service: Radiology;  Laterality: N/A;  . TONSILLECTOMY    . URETEROSCOPY  05/31/2016   Procedure: URETEROSCOPY;  Surgeon: Hollice Espy, MD;  Location: [facility];  Service: Urology;;     Inpatient Medications: Scheduled Meds: . aspirin  325 mg Per Tube Q breakfast  . bethanechol  10 mg Oral QID  . chlorhexidine gluconate (MEDLINE KIT)  15 mL Mouth Rinse BID  . Chlorhexidine Gluconate Cloth  6 each Topical Daily  . feeding supplement (PRO-STAT SUGAR FREE 64)  30 mL Per Tube Daily  . finasteride  5 mg Oral Daily  . free water  180 mL Per Tube Q6H  . insulin aspart  0-15 Units Subcutaneous Q4H  . pantoprazole (PROTONIX) IV  40 mg Intravenous QHS  . sodium chloride flush  10-40 mL Intracatheter Q12H   Continuous Infusions: . sodium chloride 75 mL/hr at 11/30/16 1700  . cefTAZidime (FORTAZ)  IV Stopped (11/30/16 1555)  . feeding supplement (GLUCERNA 1.2 CAL) 1,000 mL (11/30/16 1750)  . phenylephrine (NEO-SYNEPHRINE) Adult infusion 180 mcg/min (11/30/16 1700)  . sodium chloride irrigation    . vancomycin Stopped (11/30/16 1658)   PRN Meds: acetaminophen **OR** acetaminophen (TYLENOL) oral liquid 160 mg/5 mL **OR** acetaminophen, docusate sodium, hydrALAZINE, ondansetron (ZOFRAN) IV, senna-docusate, sodium chloride, sodium chloride flush  Allergies:    Allergies    Allergen Reactions  . Exforge [Amlodipine Besylate-Valsartan] Swelling and Other (See Comments)    Pt states that he is unable to swallow.    . Lisinopril Swelling and Other (See Comments)    Pt states that he is unable to swallow.    . Niacin And Related Swelling and Other (See Comments)    Pt states that he is unable to swallow.      Social History:   Social History   Social History  . Marital status: Married    Spouse name: N/A  . Number of children: N/A  . Years of education: N/A   Occupational History  . Not on file.   Social History Main Topics  . Smoking status: Former Smoker    Packs/day: 1.00    Years: 50.00    Types: Cigarettes  . Smokeless tobacco: Never Used  . Alcohol use No  . Drug use: No  . Sexual activity: Not on file   Other Topics Concern  . Not on file   Social History Narrative  . No narrative on file    Family History:   The patient's family history includes Alzheimer's disease in his father; Rheum arthritis in his mother.  ROS:  Please see the history of present illness.  ROS  History of large prostate and urinary difficulty. Prior myocardial infarction treated with angioplasty 1996. History of paroxysmal atrial fibrillation previously on anticoagulation therapy. Recurring GI bleeding on oral anticoagulation therapy,  Eliquis. Known  diabetic with moderate obesity. Denies hemoptysis, orthopnea, PND, and recent chest pain. Denies chills and fever. All other ROS reviewed and negative.     Physical Exam/Data:   Vitals:   11/30/16 1640 11/30/16 1650 11/30/16 1700 11/30/16 1710  BP: (!) 76/51 96/60 (!) 87/60 101/60  Pulse: 92 (!) 101 91 93  Resp: (!) 35 (!) 29 (!) 27 (!) 26  Temp:      TempSrc:      SpO2: 98% 98% 96% 97%  Weight:      Height:        Intake/Output Summary (Last 24 hours) at 11/30/16 1811 Last data filed at 11/30/16 1700  Gross per 24 hour  Intake          5435.44 ml  Output             1265 ml  Net          4170.44 ml    Filed Weights   11/28/16 0600 11/29/16 0500 11/30/16 0500  Weight: 170 lb 6.7 oz (77.3 kg) 169 lb 8.5 oz (76.9 kg) 170 lb 3.1 oz (77.2 kg)   Body mass index is 28.32 kg/m.  General:  Well nourished, well developed, in Moderate respiratory distress. Skin is warm and dry. HEENT: normal Lymph: no adenopathy Neck: Neck veins are difficult to evaluate. Endocrine:  No thryomegaly Vascular: No carotid bruits; FA pulses 2+ bilaterally without bruits  Cardiac: Diminished S1, S2; possible soft S3 gallop; IIRR; 1/6 apical systolic murmur is noted. Lungs:  clear to auscultation bilaterally, no wheezing, rhonchi or rales  Abd: soft, nontender, no hepatomegaly  Ext: no edema Musculoskeletal:  No deformities, BUE and BLE strength normal and equal Skin: warm and dry  Neuro:  CNs 2-12 intact, no focal abnormalities noted Psych:  Normal affect    EKG:  The EKG performed on 11/30/2016 at 1:58 AM was personally reviewed and demonstrates atrial fibrillation at 96 bpm, mild inferior ST elevation, and diffuse ST segment depression V2 through V6. This is improved compared to the tracing done at 1:51 AM which revealed an accelerated idioventricular rhythm  Relevant CV Studies: No repeat of echocardiography or other studies since development of acute infarction. Prior echo demonstrated LVEF of 50-55% on 11/25/2016.  Laboratory Data:  Chemistry Recent Labs Lab 11/29/16 0532 11/30/16 0202 11/30/16 1431  NA 151* 146* 149*  K 3.4* 3.8 4.1  CL 121* 118* 125*  CO2 19* 19* 16*  GLUCOSE 190* 267* 220*  BUN 36* 42* 56*  CREATININE 2.23* 2.70* 3.38*  CALCIUM 8.0* 7.8* 7.3*  GFRNONAA 25* 20* 15*  GFRAA 29* 23* 17*  ANIONGAP 11 9 8     No results for input(s): PROT, ALBUMIN, AST, ALT, ALKPHOS, BILITOT in the last 168 hours. Hematology Recent Labs Lab 11/29/16 0532 11/29/16 1925 11/30/16 0226 11/30/16 1431  WBC 8.8  --  10.8* 11.6*  RBC 2.55*  --  2.21* 2.73*  HGB 7.9* 7.6* 6.9* 7.9*  HCT 24.4*   --  21.6* 24.8*  MCV 95.7  --  97.7 90.8  MCH 31.0  --  31.2 28.9  MCHC 32.4  --  31.9 31.9  RDW 17.1*  --  17.5* 17.5*  PLT 180  --  238 204   Cardiac Enzymes Recent Labs Lab 11/30/16 1431  TROPONINI >65.00*   No results for input(s): TROPIPOC in the last 168 hours.  BNPNo results for input(s): BNP, PROBNP in the last 168 hours.  DDimer No results for input(s): DDIMER in the  last 168 hours.  Radiology/Studies:  Ct Head Wo Contrast  Result Date: 11/28/2016 CLINICAL DATA:  Stroke.  Status post revascularization. EXAM: CT HEAD WITHOUT CONTRAST TECHNIQUE: Contiguous axial images were obtained from the base of the skull through the vertex without intravenous contrast. COMPARISON:  11/22/2016 FINDINGS: Brain: Cytotoxic edema in the right MCA territory affecting the insula, operculum, and striatum. Infarct extent is unchanged from prior brain MRI. Small volume hemorrhagic conversion greatest in the putamen where there is an 11 mm hematoma, stable from prior brain MRI. Small area of right parietal and occipital cortex infarct, also stable. No new infarct noted. Swelling effaces the right lateral ventricle frontal horn and anterior body. No measurable midline shift. No entrapment. Old white matter infarct in the left frontal lobe. Vascular: Pipeline stent on the right M1 segment. No hyperdense vessel. Skull: No acute or aggressive finding Sinuses/Orbits: Chronic sinusitis with greatest opacification in the atelectatic left maxillary sinus. Bilateral cataract resection. No acute finding. IMPRESSION: 1. Stable compared to brain MRI 11/24/2016. 2. Extensive right MCA territory infarction with mild hemorrhagic conversion (11 mm hematoma present in the putamen). Electronically Signed   By: Monte Fantasia M.D.   On: 11/28/2016 16:27   US Pelvis Limited  Result Date: 11/30/2016 CLINICAL DATA:  Gross hematuria for 1 week EXAM: ULTRASOUND OF URINARY BLADDER TECHNIQUE: Transverse and longitudinal images of the  urinary bladder obtained. COMPARISON:  None. FINDINGS: Foley catheter is position within the urinary bladder. There is diffuse inhomogeneous material throughout the urinary bladder. The urinary bladder wall does not appear significantly thickened. IMPRESSION: Extensive apparent debris and likely hemorrhage throughout the urinary bladder. An underlying mass in the bladder cannot be excluded. Urinary bladder wall does not appear appreciably thickened. Foley catheter is position within the urinary bladder. Given this extensive inflammation and potential hemorrhage, it may be prudent to consider direct visualization for further assessment. Electronically Signed   By: Lowella Grip III M.D.   On: 11/30/2016 13:40   US Pelvis Limited  Result Date: 11/27/2016 CLINICAL DATA:  Gross hematuria, indwelling Foley catheter, history of recent stroke intervention of the right MCA. EXAM: LIMITED ULTRASOUND OF PELVIS TECHNIQUE: Limited transabdominal ultrasound examination of the pelvis was performed. COMPARISON:  05/18/2016 CT without contrast FINDINGS: Indwelling Foley catheter within the bladder. Bladder is decompressed but the wall does appear thickened. Heterogeneous complex echogenic abnormality within the bladder lumen measures 4.6 x 4 x 5.0 cm consistent with bladder mass or intraluminal clot. This remains nonspecific by ultrasound. IMPRESSION: Ill-defined intraluminal echogenic masslike abnormality compatible with bladder mass or clot. Electronically Signed   By: Jerilynn Mages.  Shick M.D.   On: 11/27/2016 11:09   Dg Chest Port 1 View  Result Date: 11/30/2016 CLINICAL DATA:  Central line placement EXAM: PORTABLE CHEST 1 VIEW COMPARISON:  11/30/2016 . FINDINGS: Interim placement of left IJ line, its tip is projected as the left brachycephalic vein. Feeding tube noted with tip below left hemidiaphragm. Stable cardiomegaly with pulmonary venous congestion. Mild right base atelectasis. No pleural effusion or pneumothorax.  IMPRESSION: 1. Left IJ line noted over the left brachycephalic vein. Feeding tube noted with tip below left hemidiaphragm. 2. Cardiomegaly with pulmonary venous congestion. Mild right base atelectasis. Electronically Signed   By: Marcello Moores  Register   On: 11/30/2016 14:47   Dg Chest Port 1 View  Result Date: 11/30/2016 CLINICAL DATA:  83 year old male with weakness. Cardiomyopathy. Subsequent encounter. EXAM: PORTABLE CHEST 1 VIEW COMPARISON:  11/08/2016. FINDINGS: Endotracheal tube has been removed. Feeding tube courses  below diaphragm, tip not imaged. Cardiomegaly. Calcified tortuous aorta. Pulmonary vascular prominence most notable centrally. No segmental consolidation or pneumothorax. Probable confluent she shadows right upper lobe. Attention to this on follow-up. With the degree of central pulmonary vascular prominence and tortuous aorta, limited for detecting mediastinal adenopathy or mass. IMPRESSION: Cardiomegaly. Pulmonary vascular prominence most notable centrally. No segmental consolidation. Calcified slightly tortuous aorta. Probable confluence of shadows right upper lobe. Attention to this on follow-up. Electronically Signed   By: Genia Del M.D.   On: 11/30/2016 07:34    Assessment and Plan:   1. Inferior ST elevation myocardial infarction at least 24 hours old with initial troponin greater than 65. It is unclear if this represents a completed infarct or if the patient could've had reperfusion. Given the limitations imposed by recent intracranial event, hematuria on Plavix, and relatively recent GI bleed the treatment options are limited. Invasive options are not possible. Recommend conservative management with antiplatelet therapy, IV fluid as needed to support blood pressure, consider repeat echo in a.m. to assess residual LV function and right ventricular function. It is possible that hypotension is related to right ventricular infarction and the management should be IV fluid administration  rather than presser therapy. 2. Recent right middle cerebral artery embolic CVA treated with mechanical revascularization and stenting. 3. Chronic atrial fibrillation, currently not on anticoagulation therapy because of bleeding (GI 2 months ago and currently GU bleeding). 4. History of coronary artery disease with acute infarct PTCA in 1996. Details are not known. 5. Hematuria, continuous, being managed by urology Dr. Junious Silk 6. Possible sepsis 7. Acute on chronic kidney disease, stage V. 8. Cardiogenic shock - possibly RV infarction versus related to LV systolic failure.  Overall prognosis seems extremely poor. Myocardial infarction is at least 44 hours old. Management will be controlling complications (arrhythmia, heart failure, hypotension, mechanical complications etc.). At this point, approach should be predominantly palliative. I have not ordered cardiac imaging or other tests as I do not believe they would alter the overall prognosis or change our approach.  Prolonged discussion with family concerning cardiac status and poor prognosis related to cardiogenic shock.I recommended palliative care to avoid suffering.  Time spent in direct face-to-face contact and with the family counseling concerning management and prognosis, 120 minutes.  Signed, Sinclair Grooms, MD  11/30/2016 6:11 PM

## 2016-11-30 NOTE — Progress Notes (Addendum)
7 Days Post-Op   Pt resting but arousable. He is also oriented to person, place [name]") and time ("June"). His son-in-law and daughter are here. Pt on pressors, IVF and abx. I reviewed the [place] and pt has some clot in the bladder. Urine is red in tubing, but with his progressive renal failure I doubt he's making a lot of urine. Discussed with Dr. Craige Cotta, family and Dr. Jairo Ben and pt not a candidate for the OR (cysto, clot evac , fulguration) right now.   Dr. Craige Cotta reports to family pt may have had an MI and his respiratory function is worsening. He discussed with family pt may need to be intubated. Pt expressed to son-in-law he does not want to be intubated. I discussed pt with Dr. Apolinar Junes out of a courtesy for the family.   I tried to irrigate the foley but I can't.  I'm getting fluid in and not out. I discussed with family changing the foley to see if we can get some of the clot evacuated and decompress the bladder and they wanted to proceed.   I filled the bladder with about 100 ml and removed the 22 fr 3 way and placed a 24 fr 3 way hematuria. I was able to irrigate out the clots, but he also is having bladder spasms which is makes irrigation more difficult. The irrigation cleared quickly and foley irrigated with equal return. I restarted a slow CBI and it remains light pink. I don't feel he has significant ongoing bleeding. His breathing is more labored.   Addendum: I spent about 30 minutes in direct patient care and about 120 minutes in coordinating care for this patient today.    LOS: 7 days   [name] 11/30/2016, 4:24 PM

## 2016-11-30 NOTE — Progress Notes (Addendum)
CRITICAL VALUE ALERT  Critical Value:  Troponin > 65  Date & Time Notified:  11/30/2016 15:56- paged Dr. Jamison Neighbor. 16:17 Elink called & made aware  Provider Notified: spoke with [name] RN answering on behalf of MD  Orders Received/Actions taken:

## 2016-11-30 NOTE — Consult Note (Signed)
[name] Consultation Note Requesting Physician:  Dr. Laurence Ferrari Reason for Consult:  AKI and assessment of risk of CIN if embolization of bleeding prostate  HPI:  The patient is a 83 y.o. year-old M. PMH AF (no anticoag 2/2 prev GIB, CAD, CM, DM, HLD, baseline CKD4 GFR 23-26, OSA. BPH, chronic urinary retention, h/o urologic procedures including stents/SP tube in the past (Dr. Erlene Quan)  Presented w/R MCA stroke. IR on 5/29 for endovascular complete revascularization of occluded R MCA + thrombectomy/stenting (300 cc contrast). Post procedure urinary retention with gross hematuria related to foley placement requiring copious bladder irrigation. Ongoing bleeding. Discussions held re possible embolization of prostate. Past 24 hours hypotension, now requiring pressors.   Original question from Dr. Laurence Ferrari centered around risk of CIN if procedure done to embolize prostate. However, patient already with evolving AKI, creatinine 2->2.7, oliguria. Has been getting lasix 80/day, had losartan 5/31-6/3, had 300 cc contrast 5/29. Currently on high dose neo, getting volume, transfusions for Hb down to 6.8, evaluation for sepsis and on V/F.   Pt himself currently denies pain, reports a lot of mucus in his throat and "feels like he needs to pee".   Creatinine, Ser  Date/Time Value Ref Range Status  11/30/2016 02:02 AM 2.70 (H) 0.61 - 1.24 mg/dL Final  11/29/2016 05:32 AM 2.23 (H) 0.61 - 1.24 mg/dL Final  11/28/2016 06:29 AM 2.01 (H) 0.61 - 1.24 mg/dL Final  11/25/2016 03:00 AM 2.18 (H) 0.61 - 1.24 mg/dL Final  11/24/2016 05:14 AM 2.03 (H) 0.61 - 1.24 mg/dL Final  11/25/2016 12:48 PM 1.80 (H) 0.61 - 1.24 mg/dL Final  11/04/2016 07:21 AM 1.90 (H) 0.61 - 1.24 mg/dL Final  10/31/2016 07:17 AM 1.85 (H) 0.61 - 1.24 mg/dL Final  04/30/2016 09:37 AM 2.77 (H) 0.61 - 1.24 mg/dL Final  04/16/2016 09:54 AM 2.41 (H) 0.61 - 1.24 mg/dL Final  04/15/2016 09:19 AM 2.67 (H) 0.61 - 1.24 mg/dL Final  04/14/2016 05:17 AM 3.04  (H) 0.61 - 1.24 mg/dL Final  04/13/2016 03:40 AM 3.06 (H) 0.61 - 1.24 mg/dL Final  04/12/2016 05:48 PM 2.94 (H) 0.61 - 1.24 mg/dL Final  04/12/2016 11:23 AM 3.01 (H) 0.61 - 1.24 mg/dL Final  01/11/2016 07:52 AM 1.41 (H) 0.61 - 1.24 mg/dL Final  12/09/2015 11:57 AM 1.65 (H) 0.76 - 1.27 mg/dL Final  11/25/2015 04:53 AM 1.79 (H) 0.61 - 1.24 mg/dL Final  11/24/2015 03:35 AM 2.04 (H) 0.61 - 1.24 mg/dL Final  11/23/2015 05:52 AM 2.42 (H) 0.61 - 1.24 mg/dL Final  11/22/2015 12:45 PM 2.48 (H) 0.61 - 1.24 mg/dL Final  02/23/2015 04:26 PM 1.49 (H) 0.61 - 1.24 mg/dL Final  02/22/2015 02:48 AM 1.44 (H) 0.61 - 1.24 mg/dL Final  02/21/2015 03:29 PM 1.56 (H) 0.61 - 1.24 mg/dL Final     Past Medical History:  Diagnosis Date  . Anemia   . BPH (benign prostatic hypertrophy)   . Cardiomyopathy ([place])   . Coronary artery disease   . Diverticulosis   . Essential hypertension, benign   . GI bleed 2015  . History of kidney stones   . Hypersomnia with sleep apnea, unspecified   . MI (myocardial infarction) ([place]) 1996  . Occasional tremors   . Other and unspecified hyperlipidemia   . Pure hypercholesterolemia   . Sleep apnea    unable to use C-PAP  . Type II or unspecified type diabetes mellitus without mention of complication, uncontrolled      Past Surgical History:  Procedure Laterality Date  . CARDIAC  CATHETERIZATION    . CHOLECYSTECTOMY  1994  . CORONARY ANGIOPLASTY  1996   [place]  . CYSTOSCOPY W/ URETERAL STENT PLACEMENT Left 05/31/2016   Procedure: CYSTOSCOPY WITH STENT REPLACEMENT;  Surgeon: Hollice Espy, MD;  Location: [facility];  Service: Urology;  Laterality: Left;  . CYSTOSCOPY WITH LITHOLAPAXY N/A 05/11/2016   Procedure: CYSTOSCOPY WITH LITHOLAPAXY;  Surgeon: Hollice Espy, MD;  Location: [facility];  Service: Urology;  Laterality: N/A;  . CYSTOSCOPY/URETEROSCOPY/HOLMIUM LASER/STENT PLACEMENT Left 05/11/2016   Procedure: CYSTOSCOPY/URETEROSCOPY/HOLMIUM LASER/STENT PLACEMENT;   Surgeon: Hollice Espy, MD;  Location: [facility];  Service: Urology;  Laterality: Left;  . ESOPHAGOGASTRODUODENOSCOPY (EGD) WITH PROPOFOL Left 11/23/2015   Procedure: ESOPHAGOGASTRODUODENOSCOPY (EGD) WITH PROPOFOL;  Surgeon: Fredonia Highland, MD;  Location: [facility] END[facility]COPY;  Service: Endoscopy;  Laterality: Left;  . EYE SURGERY Bilateral    Cataract Extraction with IOL   . FLEXIBLE SIGMOIDOSCOPY    . HERNIA REPAIR Left    Inguinal Hernia Repair  . inguinal hernia repair     left inguinal   . INSERTION OF SUPRAPUBIC CATHETER N/A 05/31/2016   Procedure: INSERTION OF SUPRAPUBIC CATHETER;  Surgeon: Hollice Espy, MD;  Location: [facility];  Service: Urology;  Laterality: N/A;  . IR GENERIC HISTORICAL  04/12/2016   IR NEPHROSTOMY PLACEMENT LEFT 04/12/2016 Sandi Mariscal, MD [facility]  . IR PERCUTANEOUS ART THROMBECTOMY/INFUSION INTRACRANIAL INC DIAG ANGIO  11/16/2016  . KNEE ARTHROSCOPY Left   . NEPHROSTOMY  05/11/2016   Procedure: NEPHROSTOMY tude removal;  Surgeon: Hollice Espy, MD;  Location: [facility];  Service: Urology;;  . RADIOLOGY WITH ANESTHESIA N/A 11/09/2016   Procedure: RADIOLOGY WITH ANESTHESIA;  Surgeon: Luanne Bras, MD;  Location: [facility];  Service: Radiology;  Laterality: N/A;  . TONSILLECTOMY    . URETEROSCOPY  05/31/2016   Procedure: URETEROSCOPY;  Surgeon: Hollice Espy, MD;  Location: [facility];  Service: Urology;;     Family History  Problem Relation Age of Onset  . Rheum arthritis Mother   . Alzheimer's disease Father    Social History:  reports that he has quit smoking. His smoking use included Cigarettes. He has a 50.00 pack-year smoking history. He has never used smokeless tobacco. He reports that he does not drink alcohol or use drugs.  Allergies:  Allergies  Allergen Reactions  . Exforge [Amlodipine Besylate-Valsartan] Swelling and Other (See Comments)    Pt states that he is unable to swallow.    . Lisinopril Swelling and Other (See Comments)    Pt states  that he is unable to swallow.    . Niacin And Related Swelling and Other (See Comments)    Pt states that he is unable to swallow.       Prior to Admission medications   Medication Sig Start Date End Date Taking? Authorizing Provider  Coenzyme Q10 (CO Q 10) 10 MG CAPS Take 1 tablet by mouth daily.    Yes [provider]  docusate sodium (COLACE) 100 MG capsule Take 1 capsule (100 mg total) by mouth 2 (two) times daily as needed (take to keep stool soft.). 04/28/16  Yes Hollice Espy, MD  ferrous sulfate 325 (65 FE) MG tablet Take 325 mg by mouth daily with breakfast.   Yes [provider]  finasteride (PROSCAR) 5 MG tablet Take 1 tablet (5 mg total) by mouth daily. 11/09/16  Yes Hollice Espy, MD  furosemide (LASIX) 40 MG tablet Take 40-80 mg by mouth 2 (two) times daily. Take 2 tablets every am & 1  tablet every pm.    Yes [provider]  glimepiride (AMARYL) 1 MG tablet Take 1 mg by mouth daily with breakfast.  03/02/16 03/02/17 Yes [provider]  HUMALOG MIX 75/25 KWIKPEN (75-25) 100 UNIT/ML Kwikpen Inject 16 Units into the skin daily.  06/30/16  Yes [provider]  HYDROcodone-acetaminophen (NORCO/VICODIN) 5-325 MG tablet Take 1-2 tablets by mouth every 6 (six) hours as needed for moderate pain.   Yes [provider]  losartan (COZAAR) 100 MG tablet Take 100 mg by mouth daily. 03/31/16  Yes [provider]  Magnesium 250 MG TABS Take 250 mg by mouth daily.    Yes [provider]  metolazone (ZAROXOLYN) 5 MG tablet Take 5 mg by mouth daily as needed (for fluid). Reported on 12/15/2015   Yes [provider]  oxyCODONE (OXY IR/ROXICODONE) 5 MG immediate release tablet Take 5 mg by mouth every 4 (four) hours as needed for severe pain.   Yes [provider]  potassium chloride SA (K-DUR,KLOR-CON) 20 MEQ tablet Take 20 mEq by mouth daily.   Yes [provider]  tamsulosin (FLOMAX) 0.4 MG CAPS capsule  Take 1 capsule (0.4 mg total) by mouth daily. 07/09/16  Yes Hollice Espy, MD    Inpatient medications: . aspirin  325 mg Per Tube Q breakfast  . bethanechol  10 mg Oral QID  . chlorhexidine gluconate (MEDLINE KIT)  15 mL Mouth Rinse BID  . feeding supplement (PRO-STAT SUGAR FREE 64)  30 mL Per Tube Daily  . finasteride  5 mg Oral Daily  . free water  180 mL Per Tube Q6H  . insulin aspart  0-15 Units Subcutaneous Q4H  . pantoprazole (PROTONIX) IV  40 mg Intravenous QHS    Review of Systems    Physical Exam:  Blood pressure (!) 83/58, pulse 95, temperature 98.4 F (36.9 C), temperature source Axillary, resp. rate (!) 32, height 5' 5"  (1.651 m), weight 77.2 kg (170 lb 3.1 oz), SpO2 98 %.  Gen: Frail elderly WM Lines/tubes: NG tube, L IJ triple lumen, foley with dark blood urine Skin: covered in seb k's No discernible JVD Coarse BS, no rales Tachy HR 90's S1S2 No S3 Abd soft, not tender Foley draining bloody dark red urine, small amounts SCD's in place No LE edema   Recent Labs Lab 11/24/16 0514 11/25/16 0300 11/25/16 1512 11/25/16 1850 11/26/16 0508 11/28/16 0629 11/29/16 0532 11/30/16 0202  NA 138 141  --   --   --  149* 151* 146*  K 4.1 3.6  --   --   --  2.9* 3.4* 3.8  CL 110 113*  --   --   --  122* 121* 118*  CO2 21* 15*  --   --   --  18* 19* 19*  GLUCOSE 131* 196*  --   --   --  154* 190* 267*  BUN 28* 27*  --   --   --  41* 36* 42*  CREATININE 2.03* 2.18*  --   --   --  2.01* 2.23* 2.70*  CALCIUM 7.7* 7.5*  --   --   --  7.7* 8.0* 7.8*  PHOS  --   --  3.6 3.4 3.6  --   --   --      Recent Labs Lab 11/24/16 0514  11/27/16 0610 11/27/16 1814 11/28/16 0629 11/28/16 1618 11/29/16 0532 11/29/16 1925 11/30/16 0226  WBC 8.2  < > 13.4*  --  9.9  --  8.8  --  10.8*  NEUTROABS 6.5  --   --   --   --   --   --   --   --   HGB 9.9*  < > 8.1* 9.1* 8.3* 8.2* 7.9* 7.6* 6.9*  HCT 30.8*  < > 23.9* 27.6* 25.7*  --  24.4*  --  21.6*  MCV 90.6  < > 90.2  --   93.5  --  95.7  --  97.7  PLT 154  < > 162  --  171  --  180  --  238  < > = values in this interval not displayed.   Recent Labs Lab 11/29/16 1959 11/29/16 2336 11/30/16 0354 11/30/16 0813 11/30/16 1255  GLUCAP 195* 252* 286* 231* 215*    Xrays/Other Studies: Ct Head Wo Contrast  Result Date: 11/28/2016 CLINICAL DATA:  Stroke.  Status post revascularization. EXAM: CT HEAD WITHOUT CONTRAST TECHNIQUE: Contiguous axial images were obtained from the base of the skull through the vertex without intravenous contrast. COMPARISON:  10/28/2016 FINDINGS: Brain: Cytotoxic edema in the right MCA territory affecting the insula, operculum, and striatum. Infarct extent is unchanged from prior brain MRI. Small volume hemorrhagic conversion greatest in the putamen where there is an 11 mm hematoma, stable from prior brain MRI. Small area of right parietal and occipital cortex infarct, also stable. No new infarct noted. Swelling effaces the right lateral ventricle frontal horn and anterior body. No measurable midline shift. No entrapment. Old white matter infarct in the left frontal lobe. Vascular: Pipeline stent on the right M1 segment. No hyperdense vessel. Skull: No acute or aggressive finding Sinuses/Orbits: Chronic sinusitis with greatest opacification in the atelectatic left maxillary sinus. Bilateral cataract resection. No acute finding. IMPRESSION: 1. Stable compared to brain MRI 11/24/2016. 2. Extensive right MCA territory infarction with mild hemorrhagic conversion (11 mm hematoma present in the putamen). Electronically Signed   By: Monte Fantasia M.D.   On: 11/28/2016 16:27   US Pelvis Limited  Result Date: 11/30/2016 CLINICAL DATA:  Gross hematuria for 1 week EXAM: ULTRASOUND OF URINARY BLADDER TECHNIQUE: Transverse and longitudinal images of the urinary bladder obtained. COMPARISON:  None. FINDINGS: Foley catheter is position within the urinary bladder. There is diffuse inhomogeneous material  throughout the urinary bladder. The urinary bladder wall does not appear significantly thickened. IMPRESSION: Extensive apparent debris and likely hemorrhage throughout the urinary bladder. An underlying mass in the bladder cannot be excluded. Urinary bladder wall does not appear appreciably thickened. Foley catheter is position within the urinary bladder. Given this extensive inflammation and potential hemorrhage, it may be prudent to consider direct visualization for further assessment. Electronically Signed   By: Lowella Grip III M.D.   On: 11/30/2016 13:40   Dg Chest Port 1 View  Result Date: 11/30/2016 CLINICAL DATA:  83 year old male with weakness. Cardiomyopathy. Subsequent encounter. EXAM: PORTABLE CHEST 1 VIEW COMPARISON:  11/11/2016. FINDINGS: Endotracheal tube has been removed. Feeding tube courses below diaphragm, tip not imaged. Cardiomegaly. Calcified tortuous aorta. Pulmonary vascular prominence most notable centrally. No segmental consolidation or pneumothorax. Probable confluent she shadows right upper lobe. Attention to this on follow-up. With the degree of central pulmonary vascular prominence and tortuous aorta, limited for detecting mediastinal adenopathy or mass. IMPRESSION: Cardiomegaly. Pulmonary vascular prominence most notable centrally. No segmental consolidation. Calcified slightly tortuous aorta. Probable confluence of shadows right upper lobe. Attention to this on follow-up. Electronically Signed   By: Genia Del M.D.   On: 11/30/2016 07:34  Background:  83 y.o. year-old M. PMH AF (no anticoag 2/2 prev GIB), CAD, CM, DM, HLD, baseline CKD4 GFR 23-26, OSA. BPH, chronic urinary retention, h/o urologic procedures including stents/SP tube in the past (Dr. Erlene Quan)  Presented w/R MCA stroke. IR on 5/29 for endovascular complete revascularization of occluded R MCA + thrombectomy/stenting (300 cc contrast). Post procedure urinary retention with gross hematuria related to foley  placement requiring copious bladder irrigation. Ongoing bleeding. Discussions held re possible embolization of prostate. Past 24 hours hypotension, now requiring pressors.   Original question from Dr. Laurence Ferrari centered around risk of CIN if procedure done to embolize prostate. Baseline creatinine 2/CKD4/GR 20's. However, patient already with evolving AKI, creatinine 2->2.7, oliguria. Hypotension, had rec'd lasix 80/day 5/30-6/4, losartan 5/31-6/3, 300 cc contrast 5/29; ABLA with Hb down to 6.9. Currently on high dose neo, getting volume, transfusions for Hb down to 6.8, evaluation for sepsis and on V/F.   Assessment/Recommendations  1. AKI on CKD4 - contrast/ARB/hypotension/possible sepsis. Oliguria developing. No emerging indication for dialysis but possible need for CRRT vs HD if current course of AKI continues was discussed with wife/daughter/pt. I think they would want CRRT or temporary HD for patient but not likely long term. Quality of life is most important. 2. CKD4 - baseline GFR in 20's. Likely DM/HTN/aging kidneys 3. S/p R MCA stroke - s/p endovasc revascularization/TXB/stenting/.  4. BPH w/h/o urinary obstruction 5. Gross hematuria - from bleeding prostate. S/p CBI's now off. Urology following. Dr. Laurence Ferrari questions risk of contrast for PAE of prostate - at this point 100% likelihood 6. Shock - pressor dependent. CCM managing volume and transfusion. 7. DM - meds per primary service   Charles Maes,  MD [facility] [phone] pager 11/30/2016, 2:08 PM

## 2016-11-30 NOTE — Progress Notes (Signed)
CRITICAL VALUE ALERT  Critical Value:  Hgb=6.9  Date & Time Notied:  11/30/16  0258  Provider Notified: 11/30/16 0300  Orders Received/Actions taken: 2 units of PRBCs

## 2016-11-30 NOTE — Progress Notes (Signed)
Spoke with CCM regarding increased crackles. New order received for NT suction. This nurse attempted two passes in each nostril with immediate resistance. Was able to successfully pass suction catheter via mouth, pulling back thin white secretions. Audible crackles diminished, patient states his chest doesn't feel as heavy anymore. Will continue to monitor. Dicie Beam RN BSN.

## 2016-11-30 NOTE — Progress Notes (Signed)
Pt started having an EKG change. Notified MD Amada Jupiter. Will continue to monitor

## 2016-11-30 NOTE — Progress Notes (Signed)
I will follow up with pt and family concerning rehab venue options as he is more able to participate in therapies. Unable at this time due to medical issues. [phone]

## 2016-11-30 NOTE — Progress Notes (Signed)
SLP Cancellation Note  Patient Details Name: [name] MRN: [id number] DOB: 1927-10-02   Cancelled treatment:       Reason Eval/Treat Not Completed: Other (comment) Chart reviewed for medical changes overnight. This morning he is busy with lab work and MD visits. RN shares that he's very drowsy this morning. Discussed with neurologist again - he suggested holding on evaluation for EMST today, but that other pharyngeal strengthening exercises would be appropriate to try. Will re-attempt this afternoon to see if he is more alert for participation.   [name] 11/30/2016, 10:56 AM  [name], M.A. CCC-SLP [phone]

## 2016-11-30 NOTE — Progress Notes (Signed)
Patient now receiving CBI per urology.

## 2016-12-01 ENCOUNTER — Encounter (HOSPITAL_COMMUNITY): Admission: EM | Disposition: E | Payer: Self-pay | Source: Home / Self Care | Attending: Neurology

## 2016-12-01 LAB — TYPE AND SCREEN
ABO/RH(D): A POS
Antibody Screen: NEGATIVE
UNIT DIVISION: 0
Unit division: 0

## 2016-12-01 LAB — URINE CULTURE
CULTURE: NO GROWTH
SPECIAL REQUESTS: NORMAL

## 2016-12-01 LAB — BPAM RBC
BLOOD PRODUCT EXPIRATION DATE: 201806132359
Blood Product Expiration Date: 201806132359
ISSUE DATE / TIME: 201806050629
ISSUE DATE / TIME: 201806050943
UNIT TYPE AND RH: 6200
UNIT TYPE AND RH: 6200

## 2016-12-01 SURGERY — CYSTOSCOPY, WITH BLADDER FULGURATION
Anesthesia: General

## 2016-12-03 ENCOUNTER — Ambulatory Visit: Payer: Medicare Other | Admitting: Urology

## 2016-12-05 LAB — CULTURE, BLOOD (ROUTINE X 2)
CULTURE: NO GROWTH
Culture: NO GROWTH
Special Requests: ADEQUATE
Special Requests: ADEQUATE

## 2016-12-26 NOTE — Progress Notes (Signed)
2 mg of morphine wasted in sharps box with Dicie Beam, RN.

## 2016-12-26 NOTE — Progress Notes (Signed)
Time of death 35, Dr. Amada Jupiter notified.

## 2016-12-26 NOTE — Progress Notes (Signed)
240 mL of morphine infusion wasted in sink as witnessed by Bartholomew Crews, RN.

## 2016-12-26 NOTE — Progress Notes (Signed)
Elink paged due to family with lots of questions about palliative care. MD spoke with family via camera and addressed questions. Family wishes to make patient full comfort measures at this time. Patient is agreeable to this decision.

## 2016-12-26 NOTE — Death Summary Note (Signed)
Stroke Discharge Summary  Patient ID: [name]   MRN: [id number]      DOB: 02/13/1928  Date of Admission: 12/07/2016 Date of Discharge: 12/06/2016  Attending Physician:  Marvel Plan, Stroke MD Consultant(s):    Jerilee Field, MD - urology Camille Bal, MD - nephrology Verdis Prime, MD - cardiology Lawanda Cousins, MD - critical care Malachy Moan, MD - IR  Patient's PCP:  Lauro Regulus, MD  DISCHARGE DIAGNOSIS:  Active Problems:   R MCAinfarct with right M1 occlusion s/p TICI3 mechanical thrombectomy and rescue stent   Acute STEMI   Cardiogenic shock due to STEMI   Gross hematuria due to prostate hemorrhage   Hypovolumic shock due to acute blood loss   Acute blood loss anemia requiring blood transfusion   Cardiomyopathy, ischemic   Benign essential HTN   Diabetes mellitus type 2 in nonobese (HCC)   AKI on CKD     BMI: Body mass index is 28.32 kg/m.  Past Medical History:  Diagnosis Date  . Anemia   . BPH (benign prostatic hypertrophy)   . Cardiomyopathy (HCC)   . Coronary artery disease   . Diverticulosis   . Essential hypertension, benign   . GI bleed 2015  . History of kidney stones   . Hypersomnia with sleep apnea, unspecified   . MI (myocardial infarction) (HCC) 1996  . Occasional tremors   . Other and unspecified hyperlipidemia   . Pure hypercholesterolemia   . Sleep apnea    unable to use C-PAP  . Type II or unspecified type diabetes mellitus without mention of complication, uncontrolled    Past Surgical History:  Procedure Laterality Date  . CARDIAC CATHETERIZATION    . CHOLECYSTECTOMY  1994  . CORONARY ANGIOPLASTY  1996   [place]  . CYSTOSCOPY W/ URETERAL STENT PLACEMENT Left 05/31/2016   Procedure: CYSTOSCOPY WITH STENT REPLACEMENT;  Surgeon: [name], MD;  Location: [facility] ORS;  Service: Urology;  Laterality: Left;  . CYSTOSCOPY WITH LITHOLAPAXY N/A 05/11/2016   Procedure: CYSTOSCOPY WITH LITHOLAPAXY;  Surgeon:  [name], MD;  Location: [facility];  Service: Urology;  Laterality: N/A;  . CYSTOSCOPY/URETEROSCOPY/HOLMIUM LASER/STENT PLACEMENT Left 05/11/2016   Procedure: CYSTOSCOPY/URETEROSCOPY/HOLMIUM LASER/STENT PLACEMENT;  Surgeon: [name], MD;  Location: [facility];  Service: Urology;  Laterality: Left;  . ESOPHAGOGASTRODUODENOSCOPY (EGD) WITH PROPOFOL Left 11/23/2015   Procedure: ESOPHAGOGASTRODUODENOSCOPY (EGD) WITH PROPOFOL;  Surgeon: [name], MD;  Location: [facility]COPY;  Service: Endoscopy;  Laterality: Left;  . EYE SURGERY Bilateral    Cataract Extraction with IOL   . FLEXIBLE SIGMOIDOSCOPY    . HERNIA REPAIR Left    Inguinal Hernia Repair  . inguinal hernia repair     left inguinal   . INSERTION OF SUPRAPUBIC CATHETER N/A 05/31/2016   Procedure: INSERTION OF SUPRAPUBIC CATHETER;  Surgeon: [name], MD;  Location: [facility];  Service: Urology;  Laterality: N/A;  . IR GENERIC HISTORICAL  04/12/2016   IR NEPHROSTOMY PLACEMENT LEFT 04/12/2016 [name], MD [facility]INTER[facility] RAD  . IR PERCUTANEOUS ART THROMBECTOMY/INFUSION INTRACRANIAL INC DIAG ANGIO  07-Dec-2016  . KNEE ARTHROSCOPY Left   . NEPHROSTOMY  05/11/2016   Procedure: NEPHROSTOMY tude removal;  Surgeon: [name], MD;  Location: [facility];  Service: Urology;;  . RADIOLOGY WITH ANESTHESIA N/A 12/07/16   Procedure: RADIOLOGY WITH ANESTHESIA;  Surgeon: Julieanne Cotton, MD;  Location: [facility] OR;  Service: Radiology;  Laterality: N/A;  . TONSILLECTOMY    . URETEROSCOPY  05/31/2016  Procedure: URETEROSCOPY;  Surgeon: [name], MD;  Location: [facility];  Service: Urology;;    Allergies as of 11/28/2016      Reactions   Exforge [amlodipine Besylate-valsartan] Swelling, Other (See Comments)   Pt states that he is unable to swallow.     Lisinopril Swelling, Other (See Comments)   Pt states that he is unable to swallow.     Niacin And Related Swelling, Other (See Comments)   Pt states that he is unable to swallow.         Medication List    ASK your doctor about these medications   Co Q 10 10 MG Caps Take 1 tablet by mouth daily.   docusate sodium 100 MG capsule Commonly known as:  COLACE Take 1 capsule (100 mg total) by mouth 2 (two) times daily as needed (take to keep stool soft.).   ferrous sulfate 325 (65 FE) MG tablet Take 325 mg by mouth daily with breakfast.   finasteride 5 MG tablet Commonly known as:  PROSCAR Take 1 tablet (5 mg total) by mouth daily.   furosemide 40 MG tablet Commonly known as:  LASIX Take 40-80 mg by mouth 2 (two) times daily. Take 2 tablets every am & 1 tablet every pm.   glimepiride 1 MG tablet Commonly known as:  AMARYL Take 1 mg by mouth daily with breakfast.   HUMALOG MIX 75/25 KWIKPEN (75-25) 100 UNIT/ML Kwikpen Generic drug:  Insulin Lispro Prot & Lispro Inject 16 Units into the skin daily.   HYDROcodone-acetaminophen 5-325 MG tablet Commonly known as:  NORCO/VICODIN Take 1-2 tablets by mouth every 6 (six) hours as needed for moderate pain.   losartan 100 MG tablet Commonly known as:  COZAAR Take 100 mg by mouth daily.   Magnesium 250 MG Tabs Take 250 mg by mouth daily.   metolazone 5 MG tablet Commonly known as:  ZAROXOLYN Take 5 mg by mouth daily as needed (for fluid). Reported on 12/15/2015   oxyCODONE 5 MG immediate release tablet Commonly known as:  Oxy IR/ROXICODONE Take 5 mg by mouth every 4 (four) hours as needed for severe pain.   potassium chloride SA 20 MEQ tablet Commonly known as:  K-DUR,KLOR-CON Take 20 mEq by mouth daily.   tamsulosin 0.4 MG Caps capsule Commonly known as:  FLOMAX Take 1 capsule (0.4 mg total) by mouth daily.       LABORATORY STUDIES CBC    Component Value Date/Time   WBC 11.6 (H) 11/30/2016 1431   RBC 2.73 (L) 11/30/2016 1431   HGB 8.2 (L) 11/30/2016 2210   HGB 9.9 (L) 03/12/2015 1442   HCT 24.8 (L) 11/30/2016 1431   HCT 30.4 (L) 03/12/2015 1442   PLT 204 11/30/2016 1431   PLT 264 03/12/2015  1442   MCV 90.8 11/30/2016 1431   MCV 87 03/12/2015 1442   MCV 88 07/08/2013 0431   MCH 28.9 11/30/2016 1431   MCHC 31.9 11/30/2016 1431   RDW 17.5 (H) 11/30/2016 1431   RDW 22.1 (H) 03/12/2015 1442   RDW 14.3 07/08/2013 0431   LYMPHSABS 1.1 11/24/2016 0514   LYMPHSABS 1.7 03/12/2015 1442   LYMPHSABS 1.6 07/08/2013 0431   MONOABS 0.5 11/24/2016 0514   MONOABS 1.5 (H) 07/08/2013 0431   EOSABS 0.1 11/24/2016 0514   EOSABS 0.3 03/12/2015 1442   EOSABS 0.3 07/08/2013 0431   BASOSABS 0.0 11/24/2016 0514   BASOSABS 0.0 03/12/2015 1442   BASOSABS 0.1 07/08/2013 0431   CMP  Component Value Date/Time   NA 149 (H) 11/30/2016 1431   NA 140 12/09/2015 1157   NA 138 07/08/2013 0431   K 4.1 11/30/2016 1431   K 4.0 07/08/2013 0431   CL 125 (H) 11/30/2016 1431   CL 106 07/08/2013 0431   CO2 16 (L) 11/30/2016 1431   CO2 28 07/08/2013 0431   GLUCOSE 220 (H) 11/30/2016 1431   GLUCOSE 146 (H) 07/08/2013 0431   BUN 56 (H) 11/30/2016 1431   BUN 26 12/09/2015 1157   BUN 12 07/08/2013 0431   CREATININE 3.38 (H) 11/30/2016 1431   CREATININE 1.18 07/08/2013 0431   CALCIUM 7.3 (L) 11/30/2016 1431   CALCIUM 8.0 (L) 07/08/2013 0431   PROT 7.7 11-26-2016 0717   PROT 7.7 07/06/2013 1513   ALBUMIN 3.4 (L) 11-26-2016 0717   ALBUMIN 3.2 (L) 07/06/2013 1513   AST 42 (H) 2016/11/26 0717   AST 34 07/06/2013 1513   ALT 36 11/26/2016 0717   ALT 22 07/06/2013 1513   ALKPHOS 131 (H) 11/26/16 0717   ALKPHOS 85 07/06/2013 1513   BILITOT 0.8 11-26-16 0717   BILITOT 1.0 07/06/2013 1513   GFRNONAA 15 (L) 11/30/2016 1431   GFRNONAA 57 (L) 07/08/2013 0431   GFRAA 17 (L) 11/30/2016 1431   GFRAA >60 07/08/2013 0431   COAGS Lab Results  Component Value Date   INR 1.44 11/30/2016   INR 1.11 11/24/2016   INR 1.10 2016-11-26   Lipid Panel    Component Value Date/Time   CHOL 115 11/24/2016 0514   TRIG 177 (H) 11/24/2016 0514   HDL 21 (L) 11/24/2016 0514   CHOLHDL 5.5 11/24/2016 0514   VLDL 35  11/24/2016 0514   LDLCALC 59 11/24/2016 0514   HgbA1C  Lab Results  Component Value Date   HGBA1C 8.2 (H) 11/24/2016   Urinalysis    Component Value Date/Time   COLORURINE RED (A) 11/30/2016 1010   APPEARANCEUR TURBID (A) 11/30/2016 1010   LABSPEC  11/30/2016 1010    TEST NOT REPORTED DUE TO COLOR INTERFERENCE OF URINE PIGMENT   PHURINE  11/30/2016 1010    TEST NOT REPORTED DUE TO COLOR INTERFERENCE OF URINE PIGMENT   GLUCOSEU (A) 11/30/2016 1010    TEST NOT REPORTED DUE TO COLOR INTERFERENCE OF URINE PIGMENT   HGBUR (A) 11/30/2016 1010    TEST NOT REPORTED DUE TO COLOR INTERFERENCE OF URINE PIGMENT   BILIRUBINUR (A) 11/30/2016 1010    TEST NOT REPORTED DUE TO COLOR INTERFERENCE OF URINE PIGMENT   KETONESUR (A) 11/30/2016 1010    TEST NOT REPORTED DUE TO COLOR INTERFERENCE OF URINE PIGMENT   PROTEINUR (A) 11/30/2016 1010    TEST NOT REPORTED DUE TO COLOR INTERFERENCE OF URINE PIGMENT   NITRITE (A) 11/30/2016 1010    TEST NOT REPORTED DUE TO COLOR INTERFERENCE OF URINE PIGMENT   LEUKOCYTESUR NEGATIVE 11/30/2016 1010   Urine Drug Screen No results found for: LABOPIA, COCAINSCRNUR, LABBENZ, AMPHETMU, THCU, LABBARB  Alcohol Level No results found for: [facility]   SIGNIFICANT DIAGNOSTIC STUDIES Ct Head Wo Contrast November 26, 2016 Pipeline stent placement in the right MCA. Contrast staining in regions of infarction affecting the right caudate and putamen. Low-density/infarction also evident in the sub insular brain without contrast staining. No evidence of new infarction. No significant mass effect. No demonstrable hemorrhage, though petechial bleeding could be hidden within the contrast staining.   [name] Head Wo Contrast November 26, 2016 1. Acute infarct involving the right basal ganglia and small portion of the anteroinferior  frontal lobe.  2. Distal right M1 MCA occlusion.  3. 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   Charles Brain Wo  Contrast 11/24/2016 Acute multifocal RIGHT MCA territory and posterior watershed infarcts, further propagation from prior MRI.  Acute subcentimeter LEFT cerebellar infarct.  RIGHT basal ganglia petechial hemorrhage without lobar hematoma.  Old hemorrhagic LEFT basal ganglia infarct and moderate to severe chronic small vessel ischemic disease.   DSA 11/25/2016 Status post endovascular complete revascularization of occluded right middle cerebral artery M1 segment with 3 passes of a 4 mm x 40 mm Solitaire FR retrieval device achieving a TICI 3 reperfusion with immediate occlusion of the vessel due to underlying severe segmental arteriosclerotic disease. Status post rescue stenting right middle cerebral artery due to recurrent occlusion despite retrieval thrombectomy attempts as described above, achieving a TICI 3 reperfusion.   Ct Head Code Stroke W/o Cm 11/11/2016 1. Hyperdense distal right M1/ MCA bifurcation with evidence of acute infarction in the right basal ganglia.  2. ASPECTS is at best 8.  3. No acute intracranial hemorrhage.   Transthoracic Echocardiogram 11/25/2016 - Left ventricle: The cavity size was normal. Wall thickness was increased in a pattern of moderate LVH. Systolic function was normal. The estimated ejection fraction was in the range of 50% to 55%. Images were inadequate for LV wall motion assessment. The study is not technically sufficient to allow evaluation of LV diastolic function. - Mitral valve: Mildly thickened leaflets . There was trivial regurgitation. Valve area by pressure half-time: 1.25 cm^2. - Left atrium: Severely dilated. - Right atrium: The atrium was mildly dilated. - Tricuspid valve: There was moderate regurgitation. - Pulmonary arteries: PA peak pressure: 53 mm Hg (S). - Inferior vena cava: The vessel was normal in size. The respirophasic diameter changes were in the normal range (>= 50%), consistent with normal central venous  pressure. Impressions: - Compared to a prior study in 2017, the LVEF is lower at 50-55%, moderate LVH, a-fib is present. There is severe LAE and moderate RAE, moderate TR, RVSP 53 mmHg, normal IVC size.  CUS 11/24/2016 The vertebral arteries appear patent with antegrade flow. - Findings consistent with a 1-39 percent stenosis involving the right internal carotid artery and the left internal carotid artery.  Ct Head Wo Contrast 11/28/2016 IMPRESSION: 1. Stable compared to brain MRI 11/24/2016. 2. Extensive right MCA territory infarction with mild hemorrhagic conversion (11 mm hematoma present in the putamen).     HISTORY OF PRESENT ILLNESS [name] is an 83 y.o. male who presents with acute onset left sided weakness with left facial droop, dysarthria and left hemineglect. LKN was 23:30 before going to bed. He woke up in the middle of the night to use the bathroom, but family is unsure of when. The patient was found by family this morning at 6:00 AM with dense left sided weakness, facial droop and dysarthria. He denied any deficit at that time, consistent with anosognosia/hemineglect. He was emergently transported to the ED as a Code Stroke.  PMHx includes CAD, cardiomyopathy, HTN, sleep apnea, MI, HLD and DM2. He has no prior history of stroke. Although atrial fibrillation is not listed in his PMHx in EPIC, EMS states that family told them that the patient has a hx of atrial fibrillation and was on Eliquis, but that this was stopped 2 months ago due to GI bleeding.   LSN: 23:30 tPA Given: No: Out of time window   HOSPITAL COURSE [name]  is a 83 y.o. male with history of of AF not on AC due to recent GIB, HTN, CAD, OSA who woke with L hemiparesis, dysarthria and L neglect. Hedidnot receive IV t-PA due to delay in arrival. CT and MRI showed hyperdense R MCA. Taken to IR where he received TICI3 revascularization following mechanical thrombectomy and rescue stent  placement.   Stroke: R MCAinfarct with right M1 occlusion s/p TICI3 mechanical thrombectomy and rescue stent placement. Infarct likely due to afib not on AC. Additional posterior watershed infarct and L cerebellar infarct.  Code Stroke CT hyperdense R MCA with acute R BG infarct. Aspects 8.  Ltd MRIActe R BG and small part frontal lobe infarct.   MRADistal R M1 occlusion.  DSA occluded R MCA M1s/p rescue stent placed w/ resultant TICI3 reperfusion  MRI post IR - R MCA, posterior watershed and L cerebellar infarcts. R BG petechial hemorrhage. Old L BG infarct. Moderate small vessel disease.   Carotid doppler no significant extracranial stenosis  2D Echo - EF 50-55% with atrial fibrillation noted.   LDL59  HgbA1c8.2  SCDs for VTE prophylaxis  Diet NPO time specified   No antithrombotic prior to admission, put on aspirin 325 mg daily and clopidogrel was on hold.  Acute STEMI with cardiogenic shock  BP 80s to 90s  EKG showed STEMI lateral wall vs. Inferior wall  Cardiology consulted - appreciate recs  No intervention or specific management available  Palliative care recommended.  Hematuria, gross   Urology on board, appreciate recs  Continue hematuria today with dropping Hb and BPs  plavix on hold  Plan to go to IR for embolization to stop bleeding, however, pt not a candidate for that due to severe medical condition  Anemia with hypovolumic shock  due to acute blood loss - 9.1->8.3->7.9->6.9  Received PRBC transfusion x 3 + 2  H&H q12h  SBP 80-90s  On IVF @ 75 and TF @ 55 with free water flush  CCM on board   Palliative care recommended  Atrial Fibrillation  Home anticoagulation: none  Eliquis stopped 2 months ago d/t GIB  on ASA, but plavix on hold   Diabetes type II  HgbA1c8.2, goal < 7.0  Uncontrolled  SSI  AKI on CKD III - due to hypotension, anemia, dehydration  Cre 2.01->2.23 ->2.70  Na 149->151->146  Restart  TF @ 50  Continue NS @ 75  Free water @ 10  BMP monitoring  Nephrology on board - appreciate recs  Other Stroke Risk Factors  Advanced age  Former Cigarette smoker  Hx of CAD  OSA, unable to use CPAP at home  Other Active Problems  Hx asymptomatic bradycardia  Hypokalemia (lasix 80 mg daily) - 3.6 -> 2.9 ->3.4    DISCHARGE EXAM Pt deceased   Marvel Plan, MD PhD Stroke Neurology 11/28/2016 4:55 PM

## 2016-12-26 NOTE — Progress Notes (Signed)
2 mg morphine wasted with Dicie Beam, RN in sharps box.

## 2016-12-26 DEATH — deceased

## 2017-01-19 ENCOUNTER — Other Ambulatory Visit (HOSPITAL_COMMUNITY): Payer: Self-pay | Admitting: Emergency Medicine

## 2017-01-19 DIAGNOSIS — I639 Cerebral infarction, unspecified: Secondary | ICD-10-CM

## 2017-01-20 ENCOUNTER — Encounter (HOSPITAL_COMMUNITY): Payer: Self-pay | Admitting: Interventional Radiology

## 2017-02-15 IMAGING — US US RENAL
1 series · 14 of 25 positions shown · non-contrast
Comparison: CT 05/18/2016

CLINICAL DATA: Left ureteral stone follow-up

EXAM:
RENAL / URINARY TRACT ULTRASOUND COMPLETE

[Series 1: us renal · 0.22mm/px · 14 of 51 slices shown]
[im 1/51]
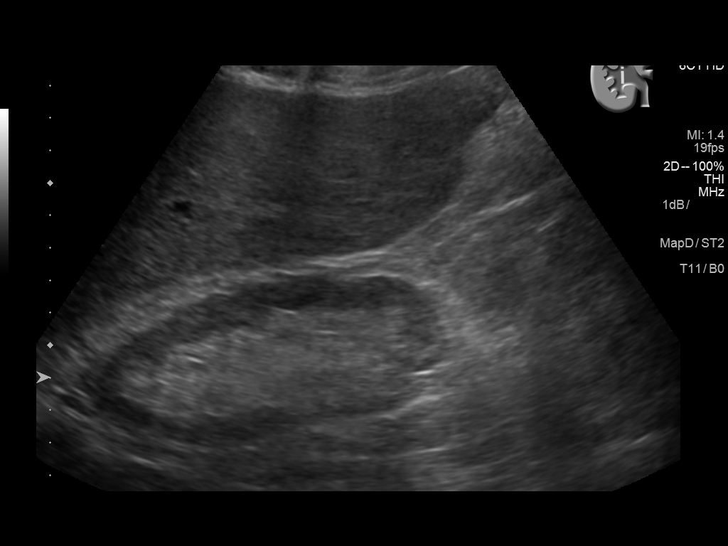
[im 5/51]
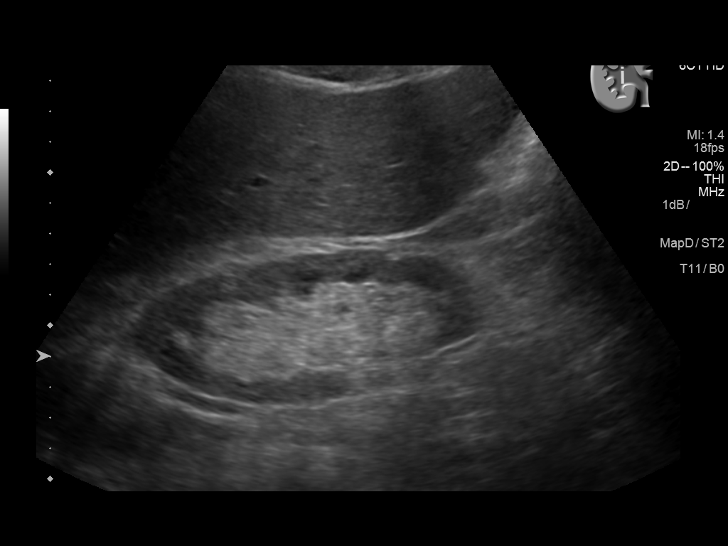
[im 9/51]
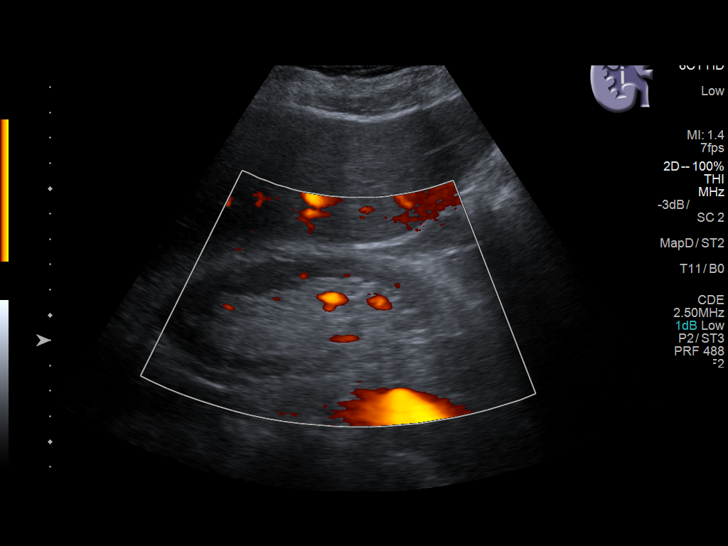
[im 13/51]
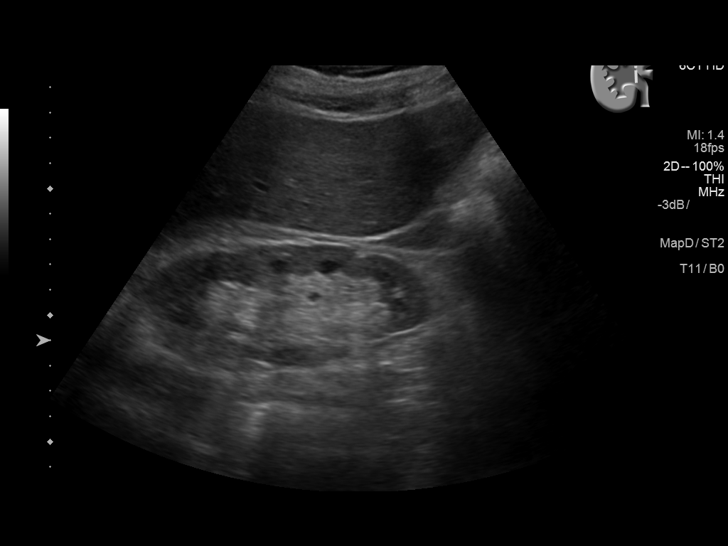
[im 17/51]
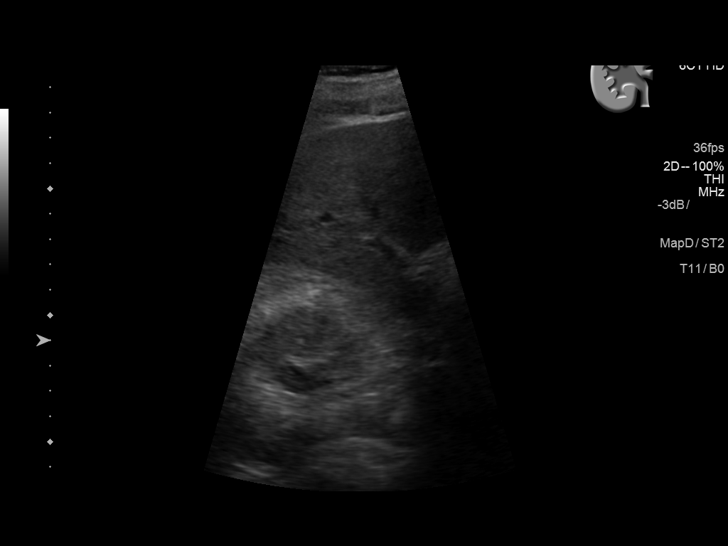
[im 19/51]
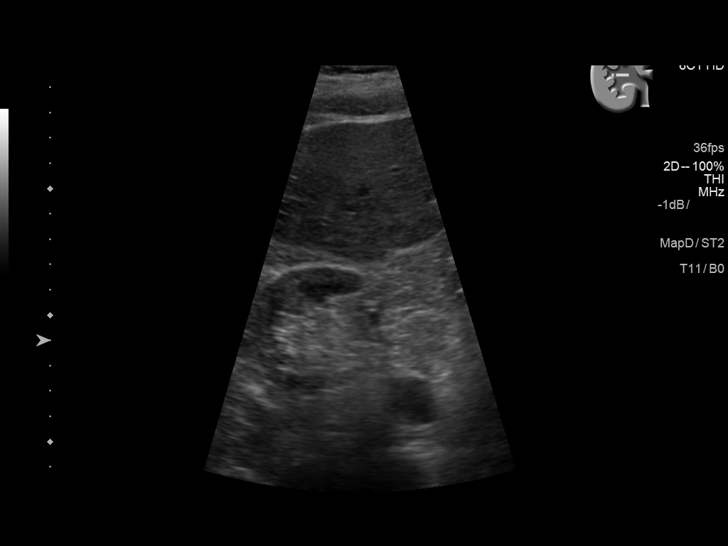
[im 23/51]
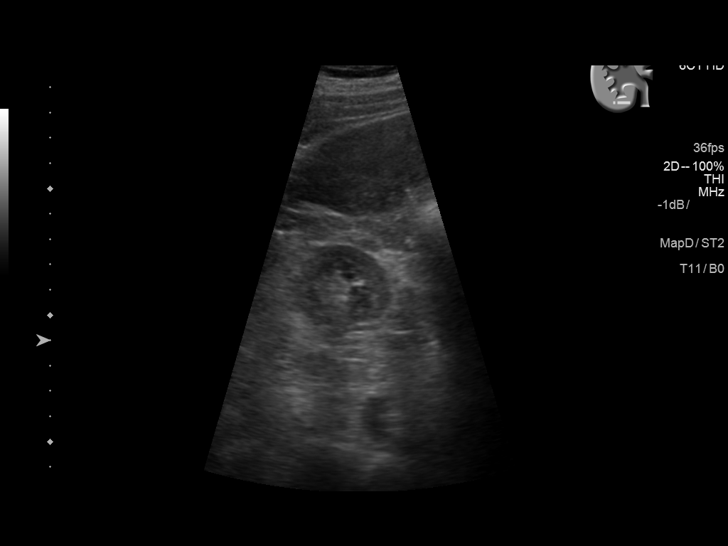
[im 28/51]
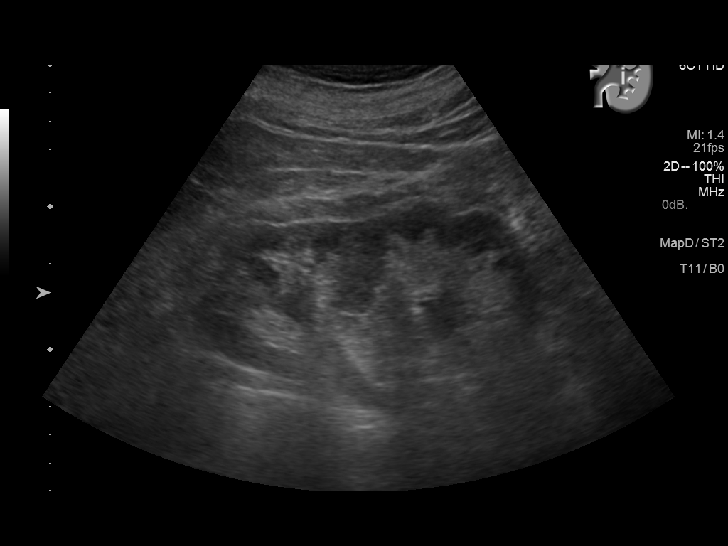
[im 32/51]
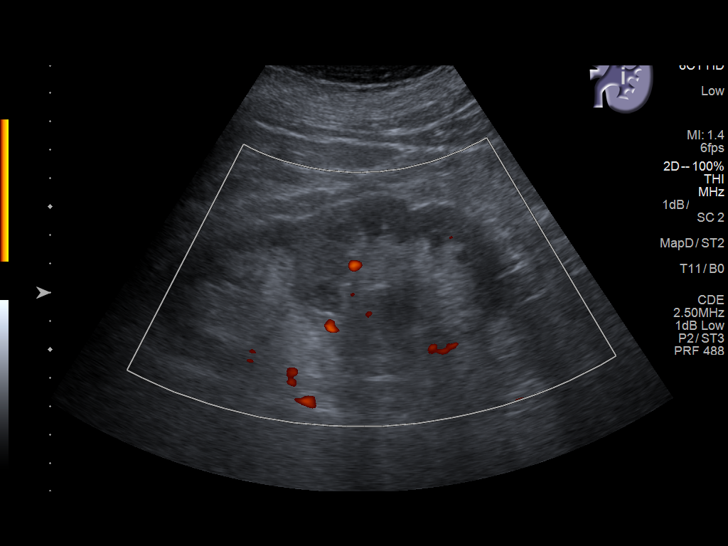
[im 34/51]
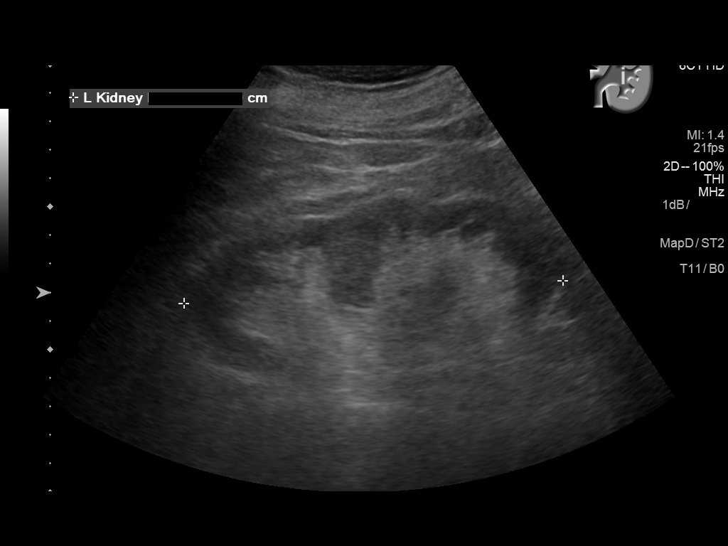
[im 38/51]
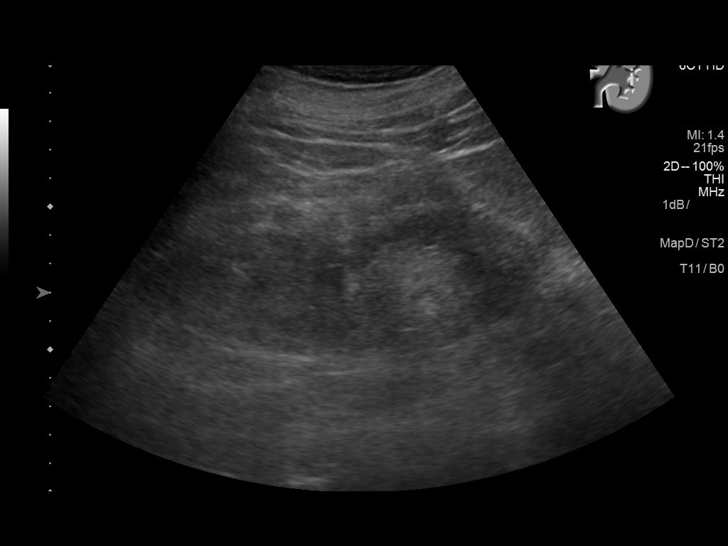
[im 42/51]
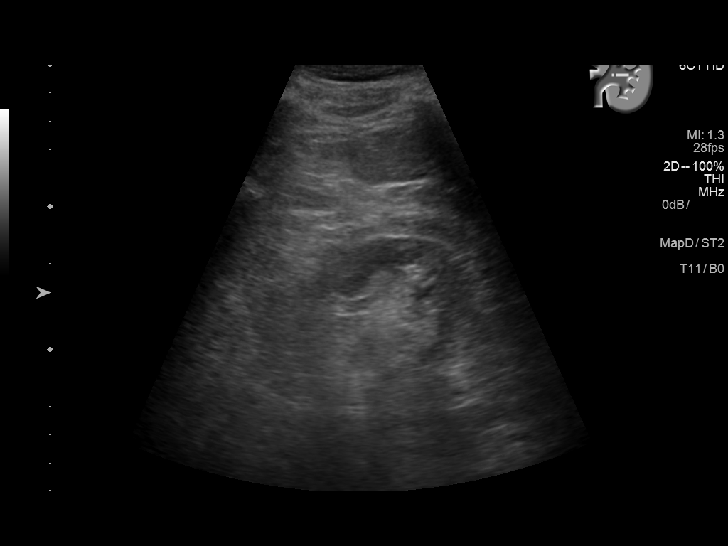
[im 46/51]
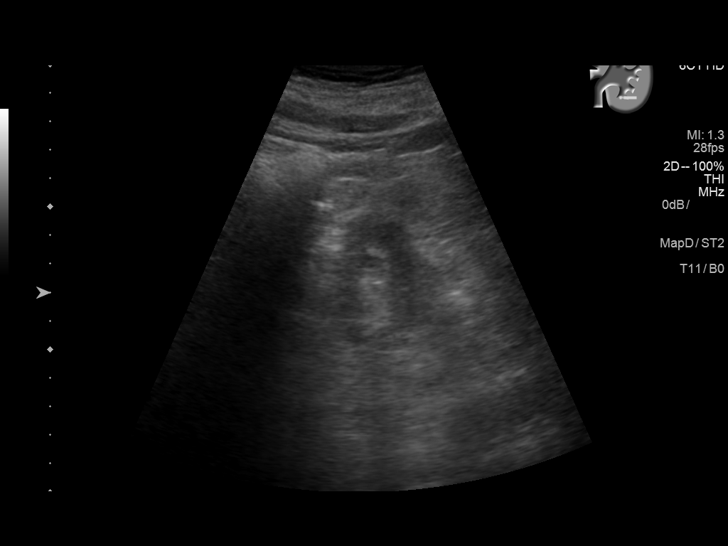
[im 51/51]
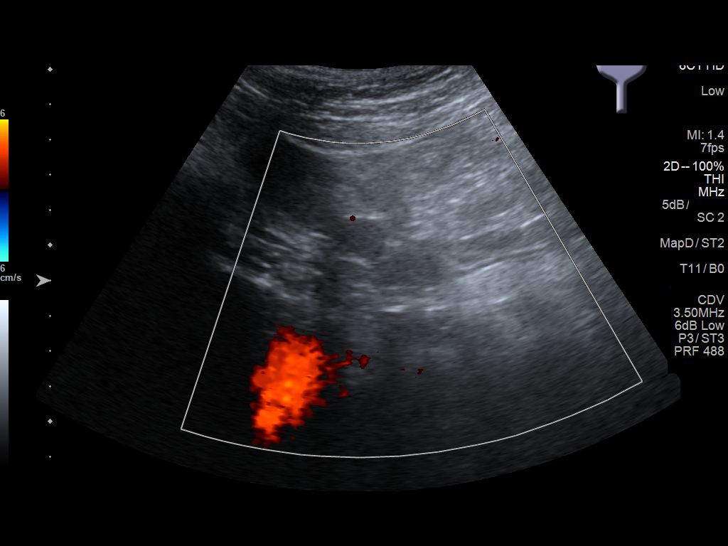

[14 of 25 positions shown; findings below may reference images not displayed]

FINDINGS: Right Kidney:

Length: 11.7 cm. Mildly increased echotexture and cortical thinning.
No hydronephrosis or mass.

Left Kidney:

Length: 13.3 cm. Mildly increased echotexture and cortical thinning.
No hydronephrosis. No mass or visible stones.

Bladder:

Not visualized.  Suprapubic catheter in place.
IMPRESSION: Mild cortical thinning and increased echotexture in the kidneys
bilaterally compatible with chronic medical renal disease.

No hydronephrosis or acute findings.

## 2017-03-18 ENCOUNTER — Ambulatory Visit: Payer: Medicare Other | Admitting: Urology

## 2017-07-21 IMAGING — US US PELVIS LIMITED
1 series · 14 of 19 positions shown · non-contrast
Comparison: 05/18/2016 CT without contrast

CLINICAL DATA: Gross hematuria, indwelling Foley catheter, history
of recent stroke intervention of the right MCA.

EXAM:
LIMITED ULTRASOUND OF PELVIS
TECHNIQUE: Limited transabdominal ultrasound examination of the pelvis was
performed.

[Series 1: us pelvis limited · 0.22mm/px · 14 of 19 slices shown]
[im 1/19]
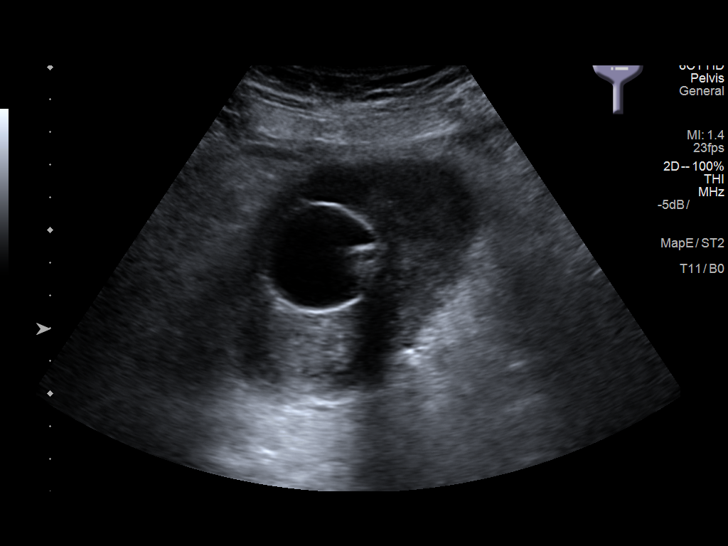
[im 3/19]
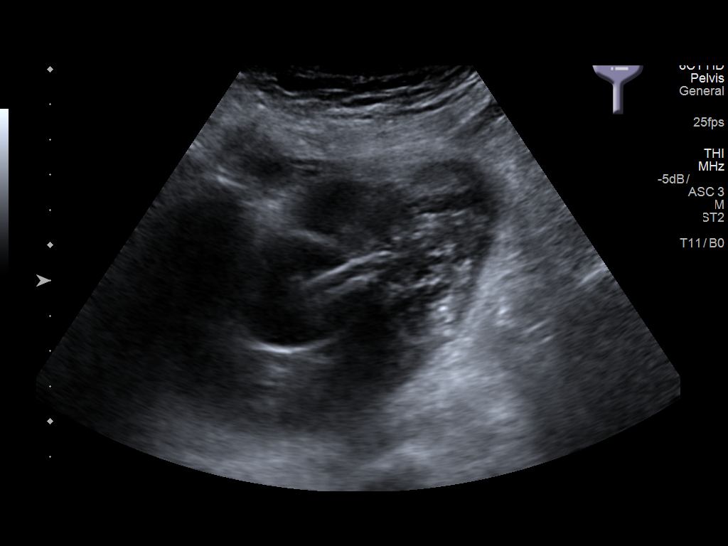
[im 4/19]
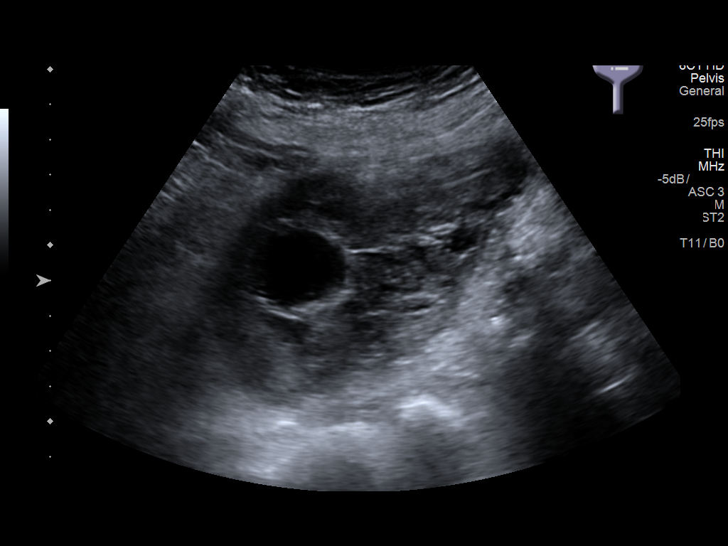
[im 5/19]
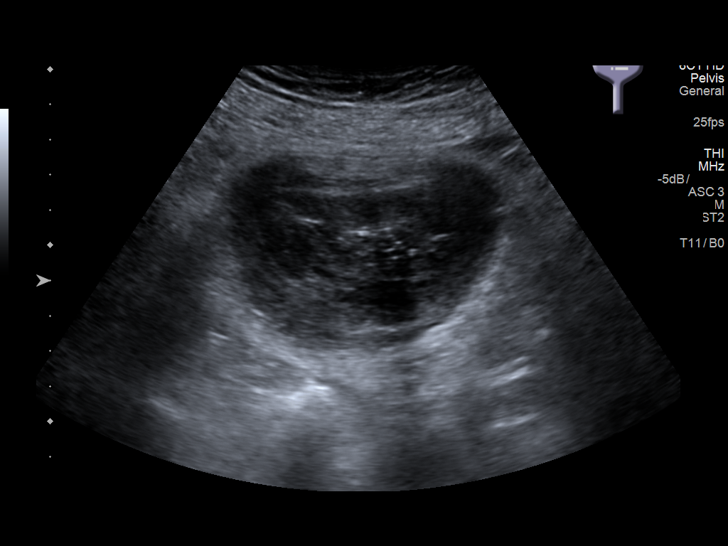
[im 7/19]
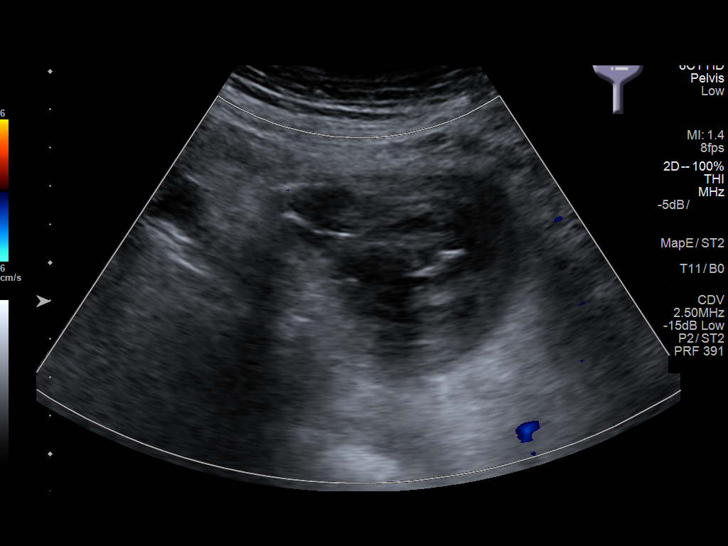
[im 8/19]
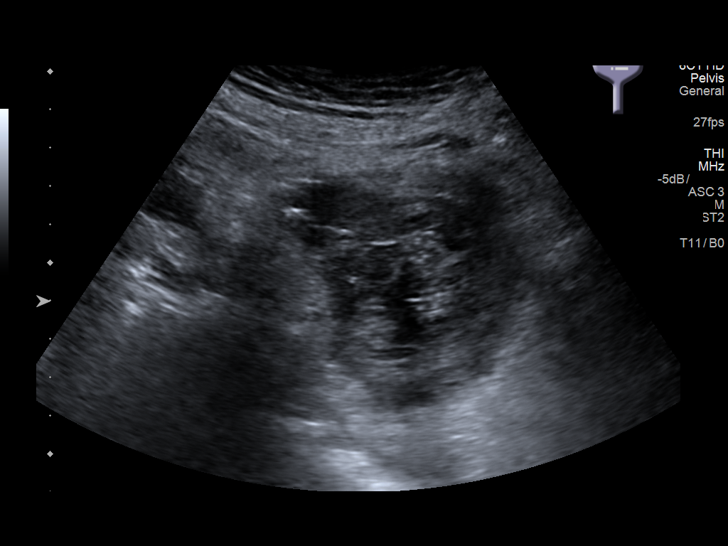
[im 9/19]
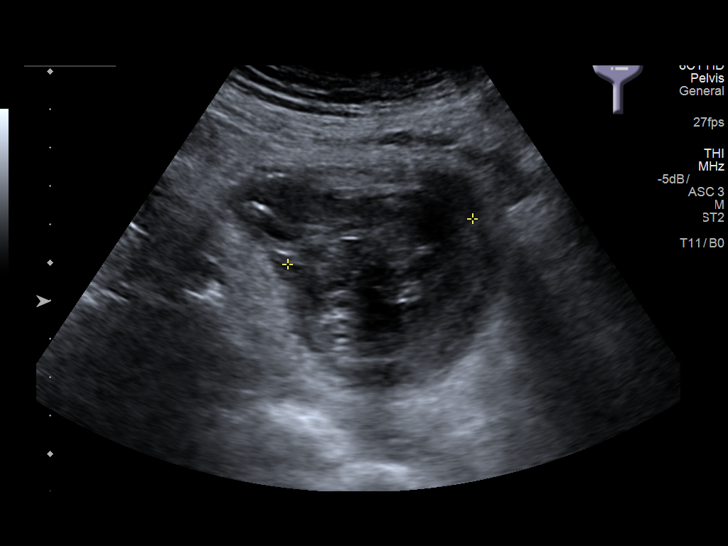
[im 11/19]
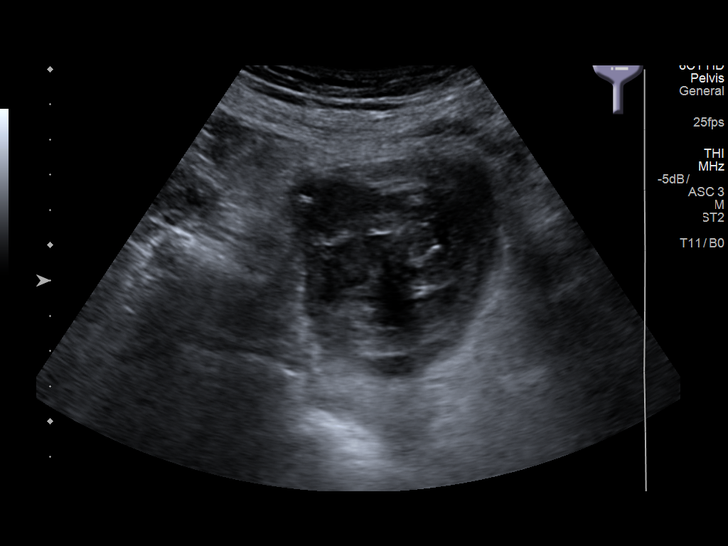
[im 12/19]
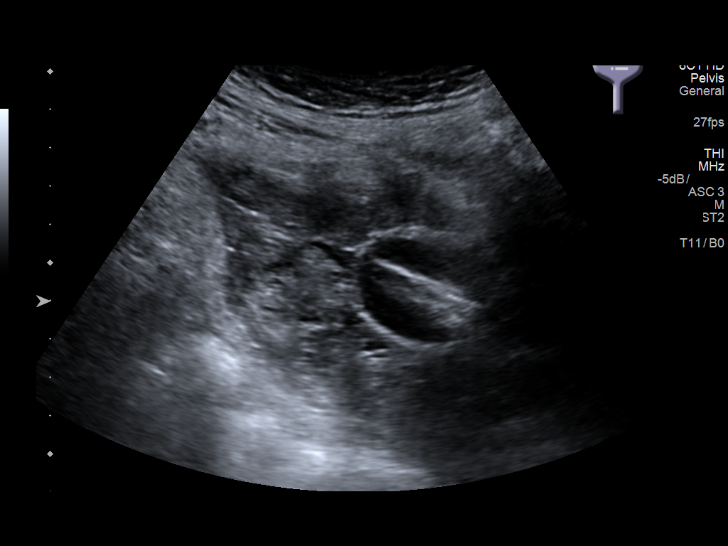
[im 13/19]
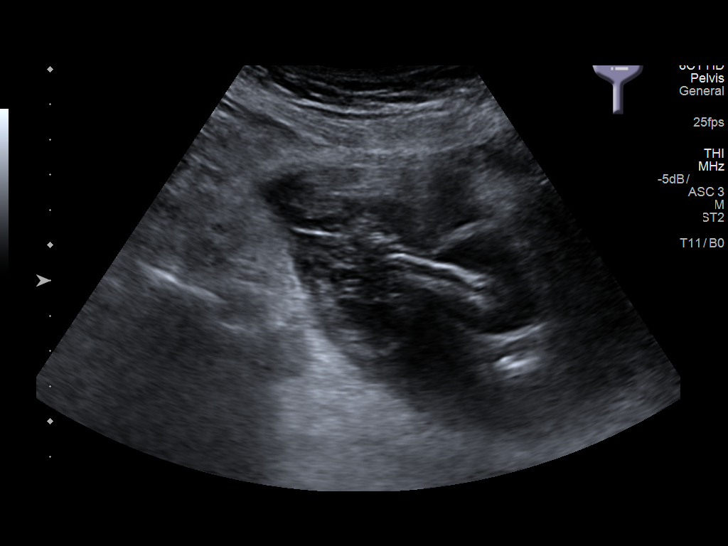
[im 15/19]
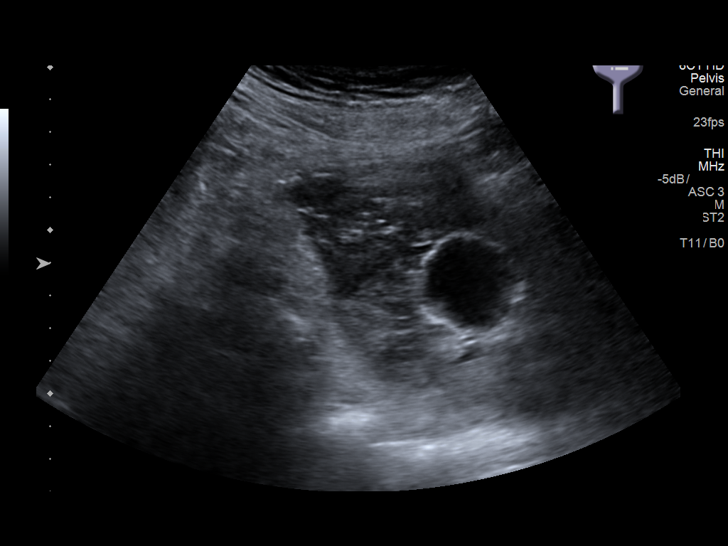
[im 16/19]
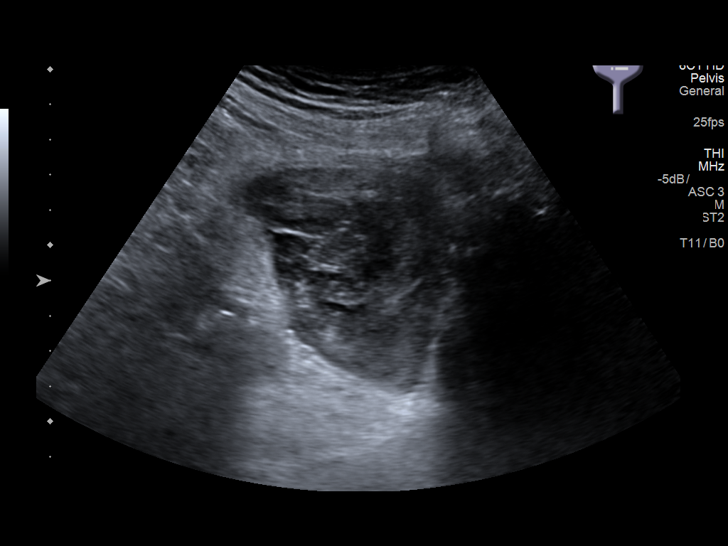
[im 17/19]
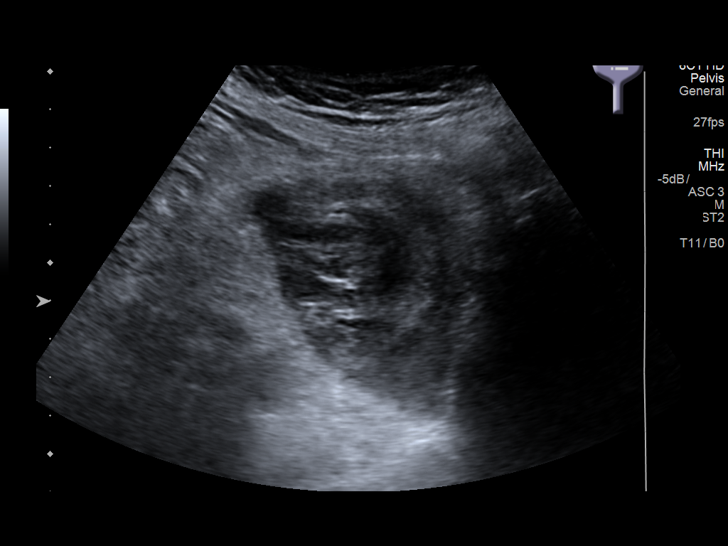
[im 19/19]
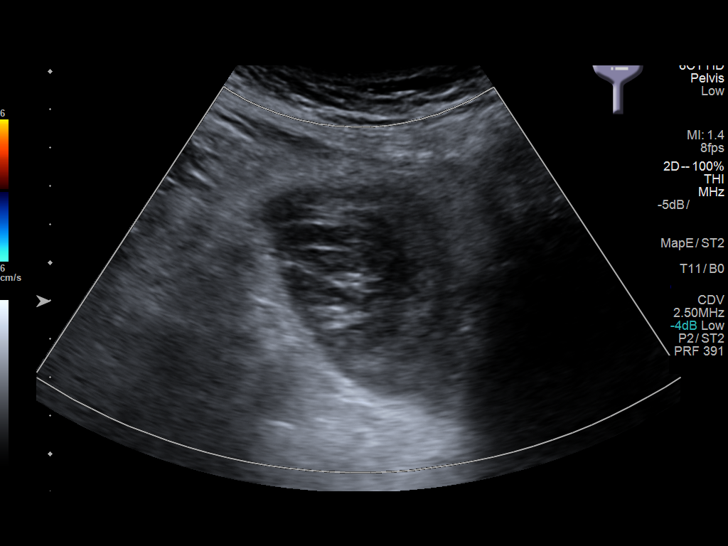

[14 of 19 positions shown; findings below may reference images not displayed]

FINDINGS: Indwelling Foley catheter within the bladder. Bladder is
decompressed but the wall does appear thickened. Heterogeneous
complex echogenic abnormality within the bladder lumen measures
x 4 x 5.0 cm consistent with bladder mass or intraluminal clot. This
remains nonspecific by ultrasound.
IMPRESSION: Ill-defined intraluminal echogenic masslike abnormality compatible
with bladder mass or clot.

## 2017-07-24 IMAGING — US US PELVIS LIMITED
1 series · 6 of 6 positions shown · non-contrast
Comparison: None.

CLINICAL DATA: Gross hematuria for 1 week

EXAM:
ULTRASOUND OF URINARY BLADDER
TECHNIQUE: Transverse and longitudinal images of the urinary bladder obtained.

[Series 1: us pelvis limited · 0.26mm/px · 6 of 6 slices shown]
[im 1/6]
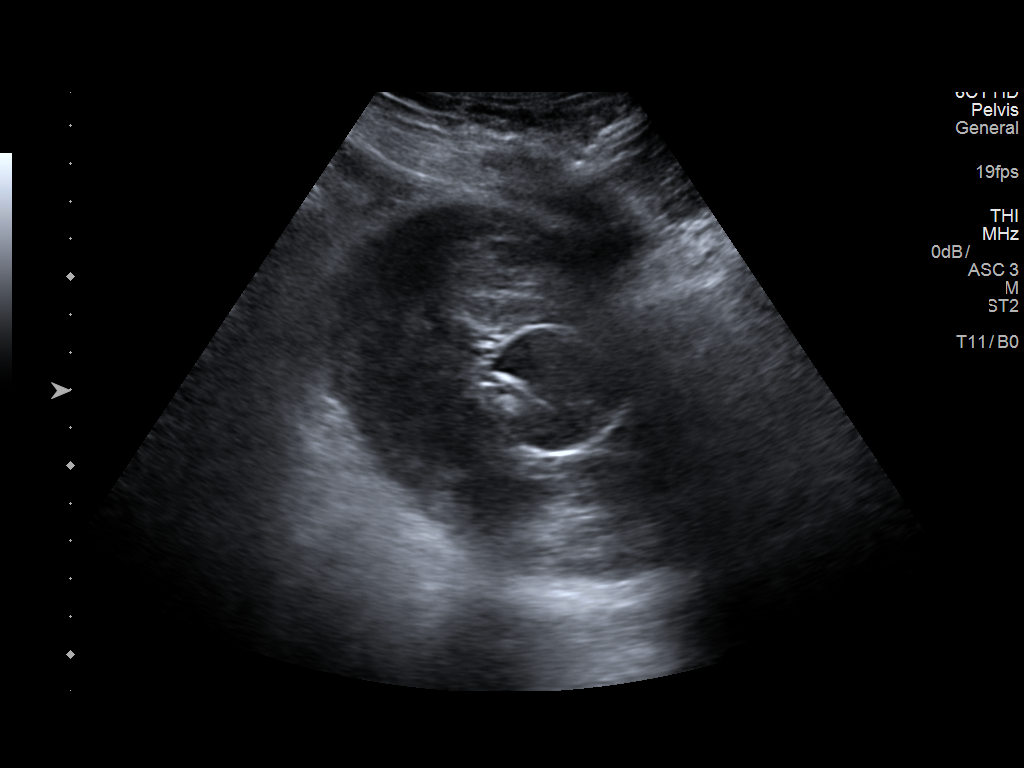
[im 2/6]
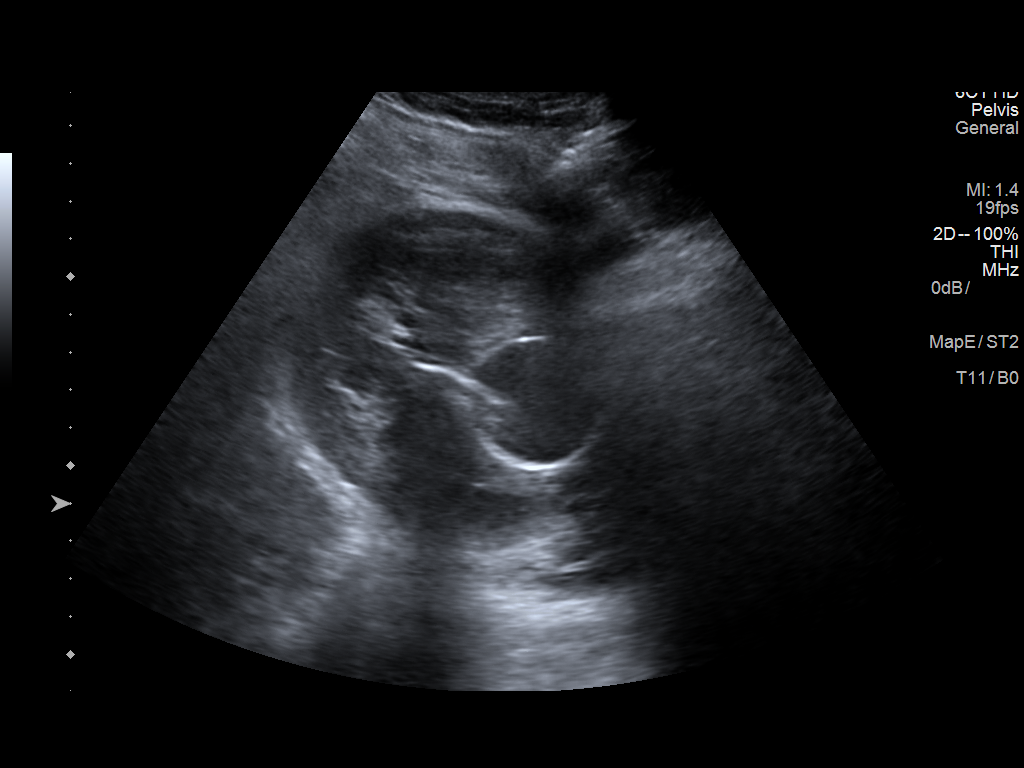
[im 3/6]
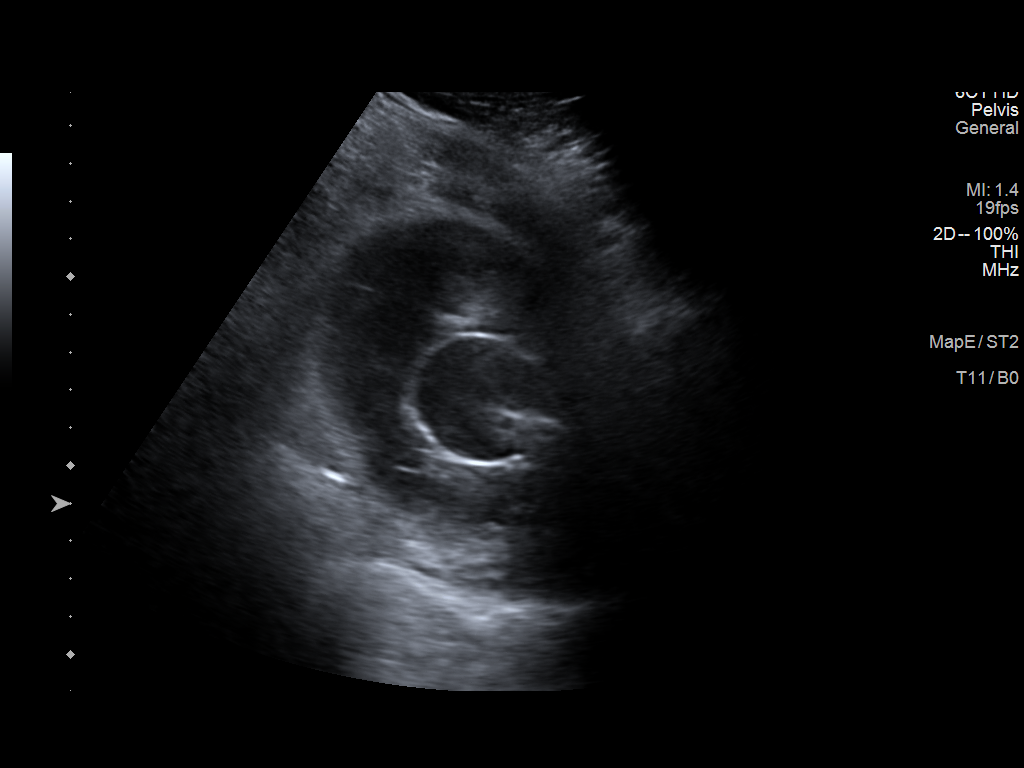
[im 4/6]
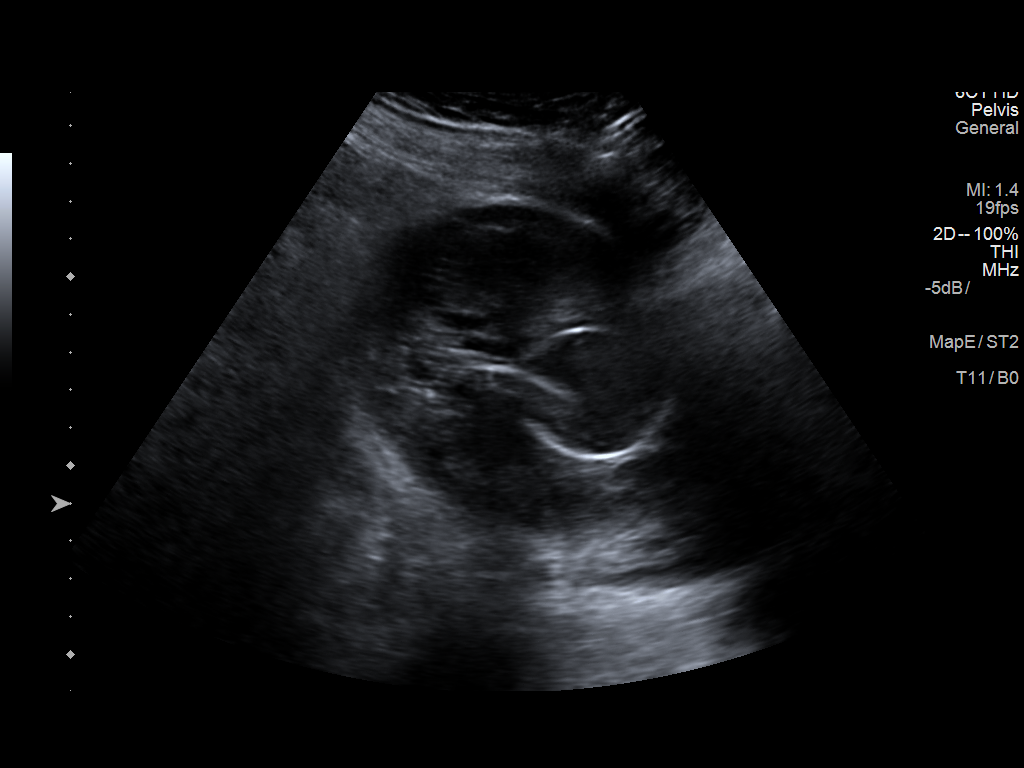
[im 5/6]
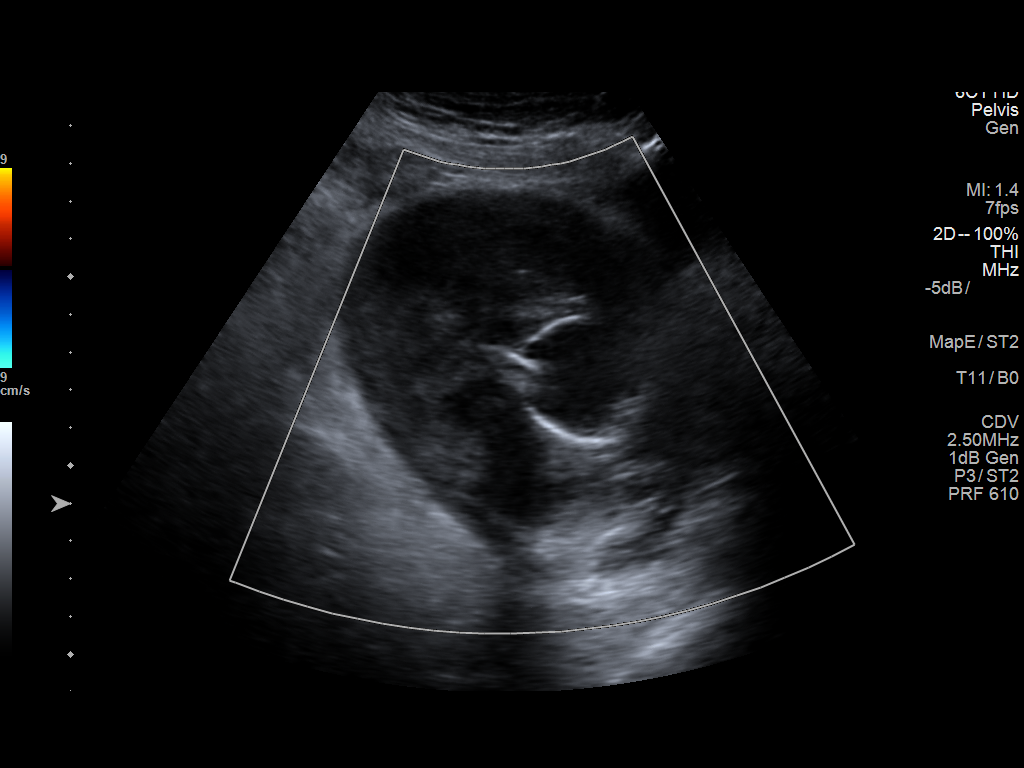
[im 6/6]
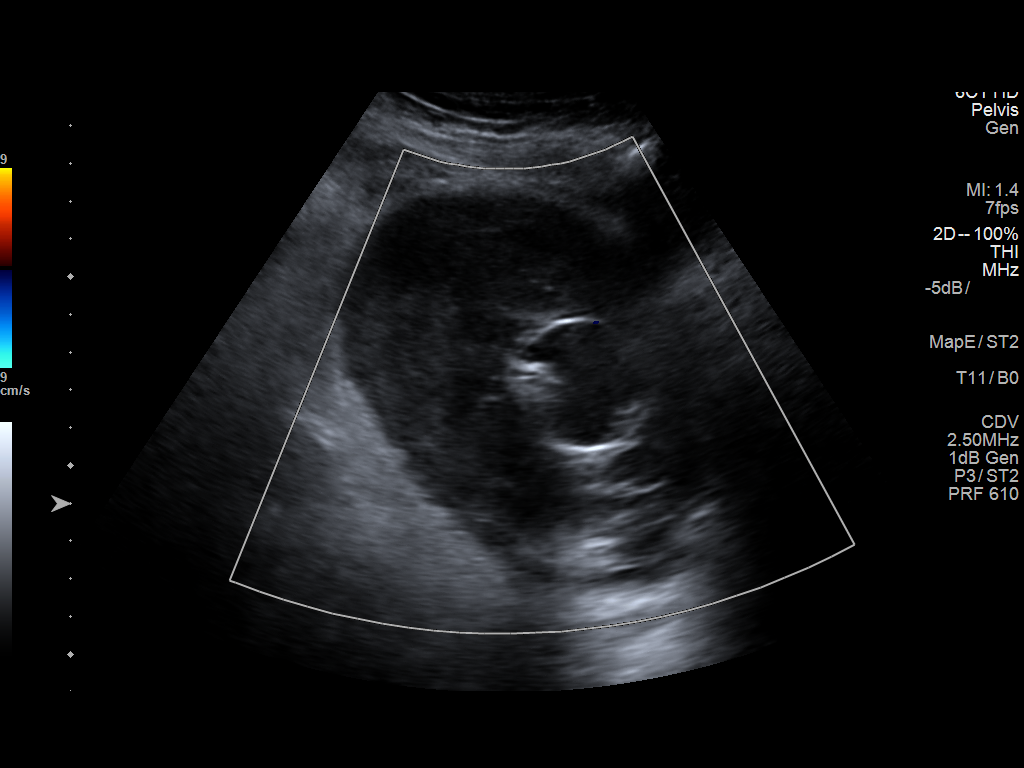

[6 of 6 positions shown; findings below may reference images not displayed]

FINDINGS: Foley catheter is position within the urinary bladder. There is
diffuse inhomogeneous material throughout the urinary bladder. The
urinary bladder wall does not appear significantly thickened.
IMPRESSION: Extensive apparent debris and likely hemorrhage throughout the
urinary bladder. An underlying mass in the bladder cannot be
excluded. Urinary bladder wall does not appear appreciably
thickened. Foley catheter is position within the urinary bladder.

Given this extensive inflammation and potential hemorrhage, it may
be prudent to consider direct visualization for further assessment.

## 2017-12-26 IMAGING — CT CT HEAD CODE STROKE
3 series · 15 of 47 positions shown, 18 images · non-contrast
Comparison: None.

CLINICAL DATA: Code stroke. Left-sided weakness, facial droop, and
slurred speech.

EXAM:
CT HEAD WITHOUT CONTRAST
TECHNIQUE: Contiguous axial images were obtained from the base of the skull
through the vertex without intravenous contrast.

[Series 3: head 5.0 st · axial · 0.41mm/px · z∈[-48,+82]mm · 9 of 32 slices shown, 12 images]
[im 3/32  brain]
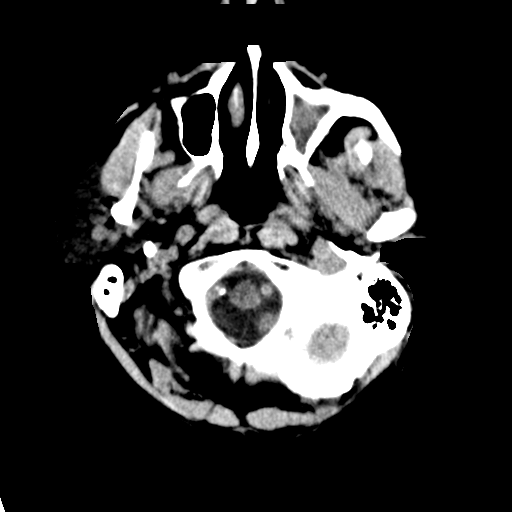
[im 3/32  bone]
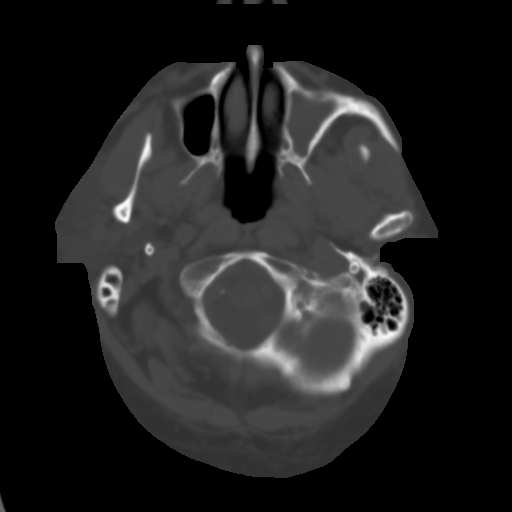
[im 6/32  brain]
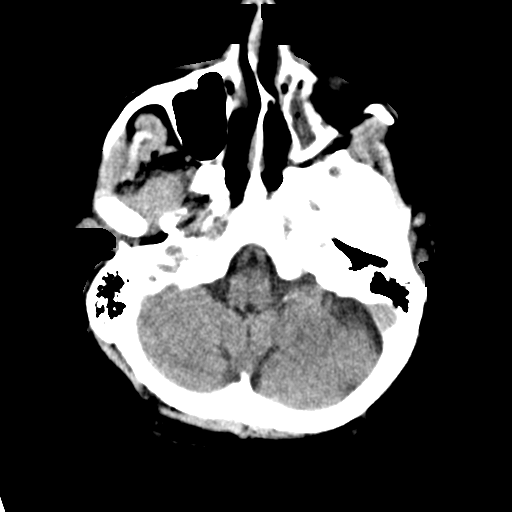
[im 9/32  brain]
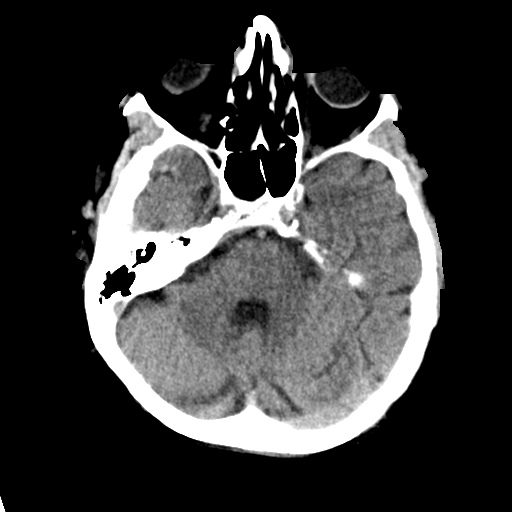
[im 12/32  brain]
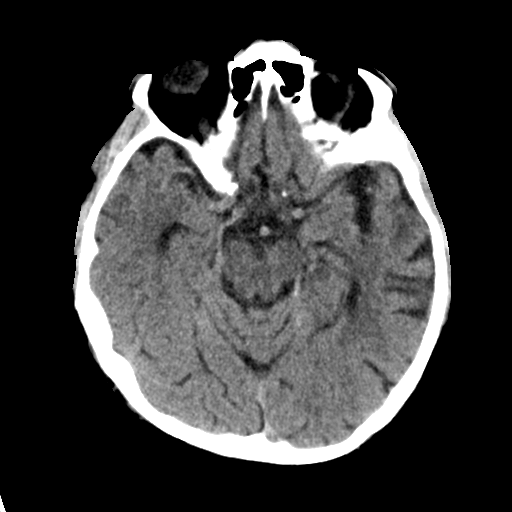
[im 17/32  brain]
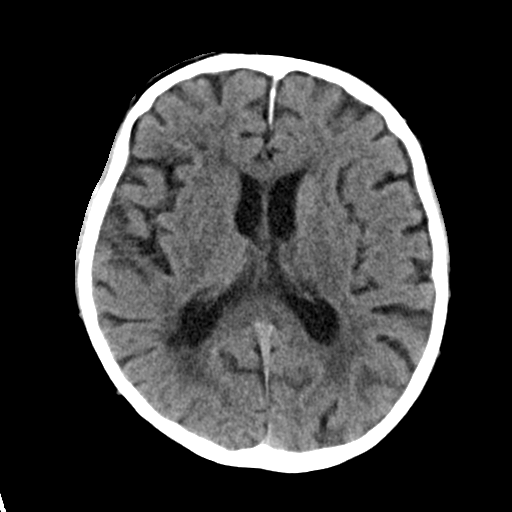
[im 17/32  bone]
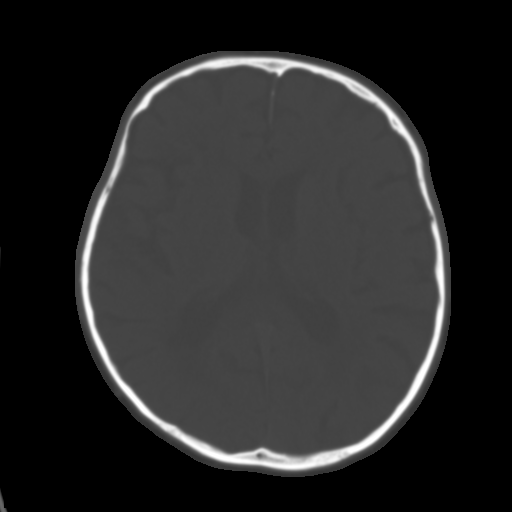
[im 20/32  brain]
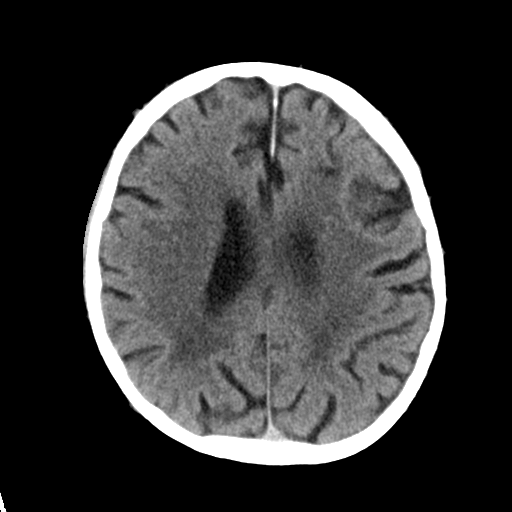
[im 23/32  brain]
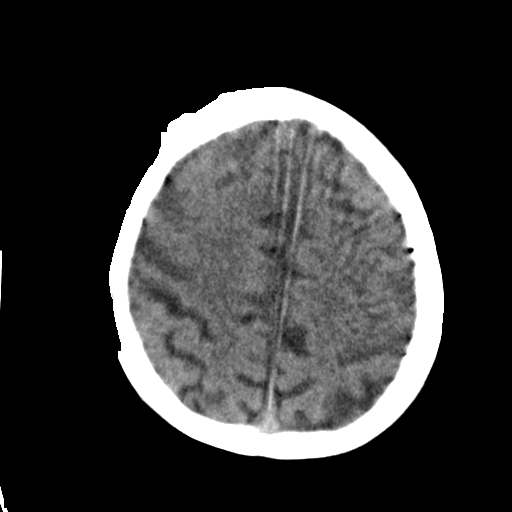
[im 26/32  brain]
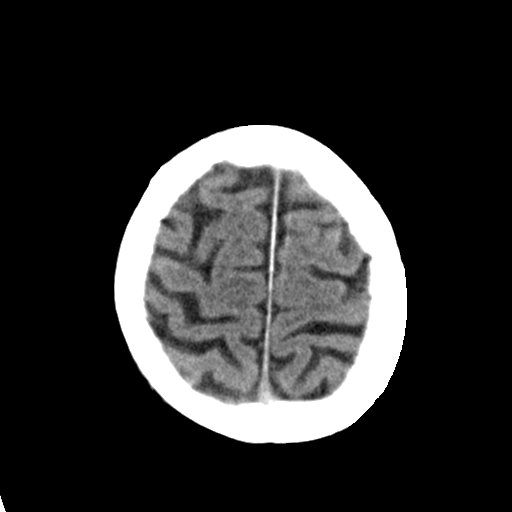
[im 29/32  brain]
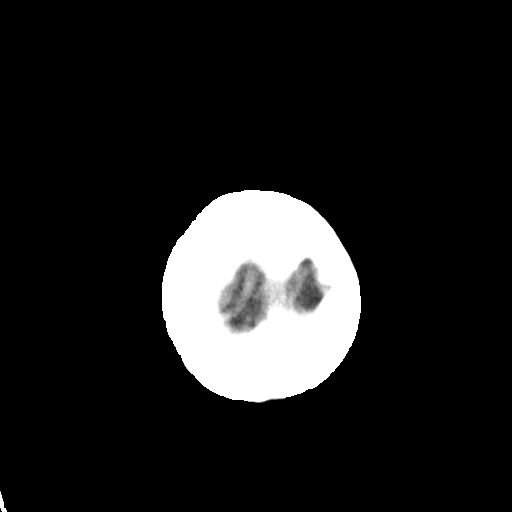
[im 29/32  bone]
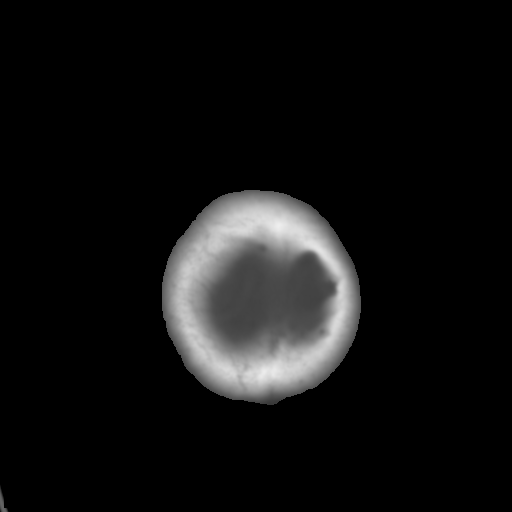

[Series 5: head 3.0 cor st · coronal · 0.33mm/px · 3 of 67 slices shown]
[im 23/67  brain]
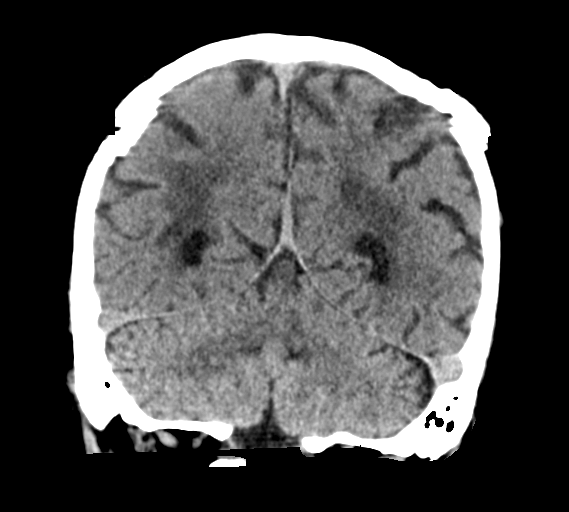
[im 30/67  brain]
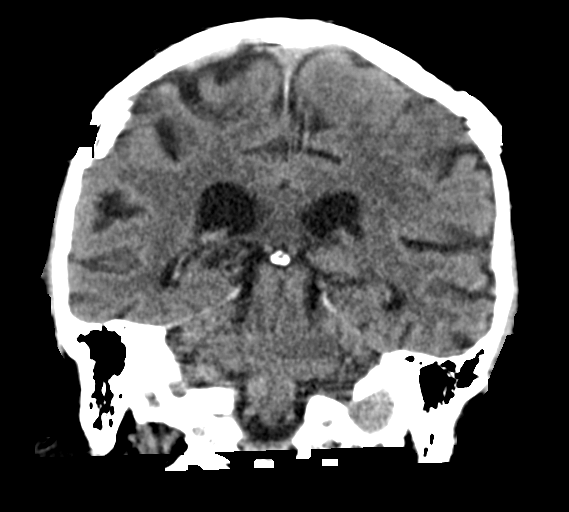
[im 37/67  brain]
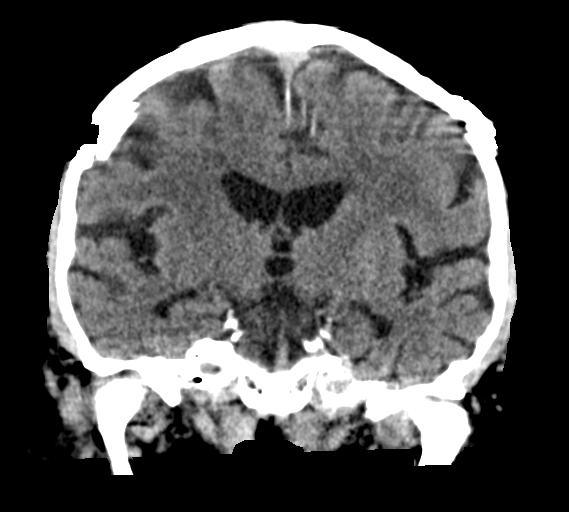

[Series 6: head 3.0 sag st · sagittal · 0.32mm/px · 3 of 63 slices shown]
[im 21/63  brain]
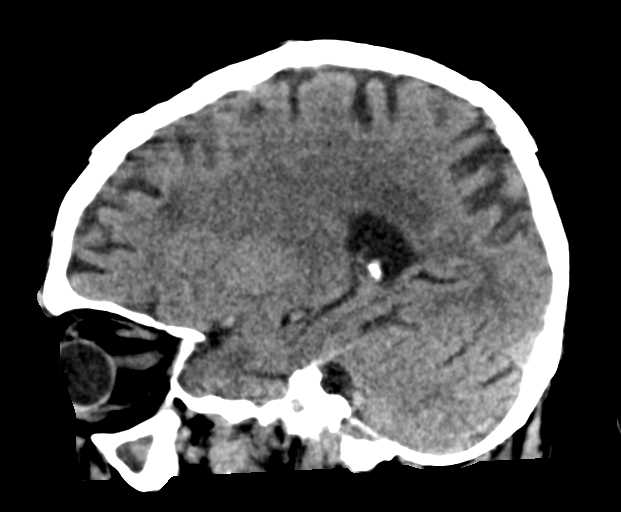
[im 32/63  brain]
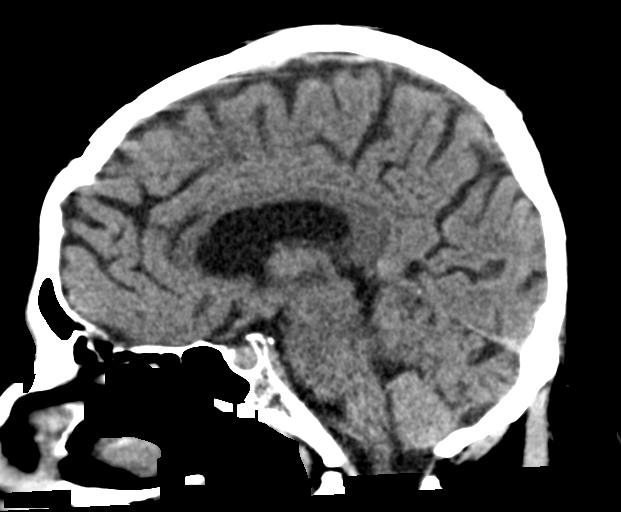
[im 42/63  brain]
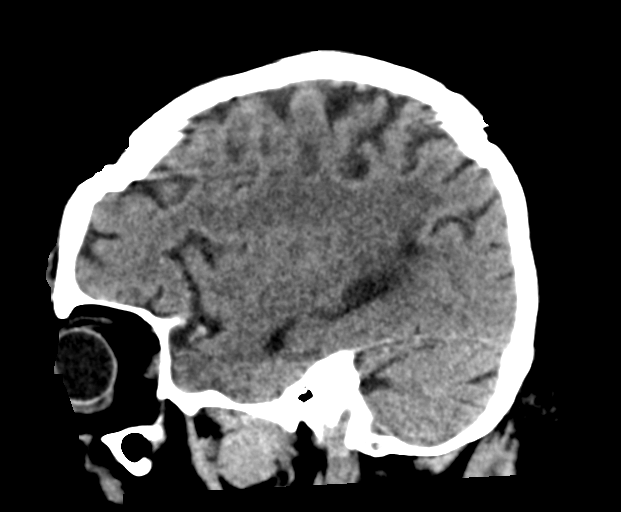

[15 of 47 positions shown; findings below may reference images not displayed]

FINDINGS: Brain: There is mild motion artifact towards the vertex. High right
frontal hypoattenuation on image 22 is favored to reflect motion
artifact over early infarct based on reformats. There is however
abnormal hypoattenuation in the right caudate and lentiform nuclei.
No intracranial hemorrhage, mass, midline shift, or extra-axial
fluid collection is identified. Mild cerebral atrophy is within
normal limits for age. Subcortical and deep cerebral white matter
hypodensities are nonspecific but compatible with moderate chronic
small vessel ischemic disease. Lacunar infarcts are present in the
deep cerebral white matter bilaterally and left basal ganglia,
likely chronic.

Vascular: Abnormal hyperdensity of the right MCA at the distal
M1/bifurcation region. Calcified atherosclerosis at the skullbase.

Skull: No fracture or focal osseous lesion.

Sinuses/Orbits: Chronic left maxillary sinusitis with sinus
atelectasis and near complete opacification. Clear mastoid air
cells. Bilateral cataract extraction.

Other: None.

ASPECTS (Alberta Stroke Program Early CT Score)

- Ganglionic level infarction (caudate, lentiform nuclei, internal
capsule, insula, M1-M3 cortex): 5

- Supraganglionic infarction (M4-M6 cortex): 3

Total score (0-10 with 10 being normal): 8
IMPRESSION: 1. Hyperdense distal right M1/ MCA bifurcation with evidence of
acute infarction in the right basal ganglia.
2. ASPECTS is at best 8.
3. No acute intracranial hemorrhage.
These results were called by telephone at the time of interpretation
on 11/23/2016 at [DATE] to Dr. Av, who verbally acknowledged
these results.

## 2018-01-02 IMAGING — DX DG CHEST 1V PORT
1 series · 1 of 1 positions shown · non-contrast
Comparison: 11/23/2016.

CLINICAL DATA: 89-year-old male with weakness. Cardiomyopathy.
Subsequent encounter.

EXAM:
PORTABLE CHEST 1 VIEW

[chest]
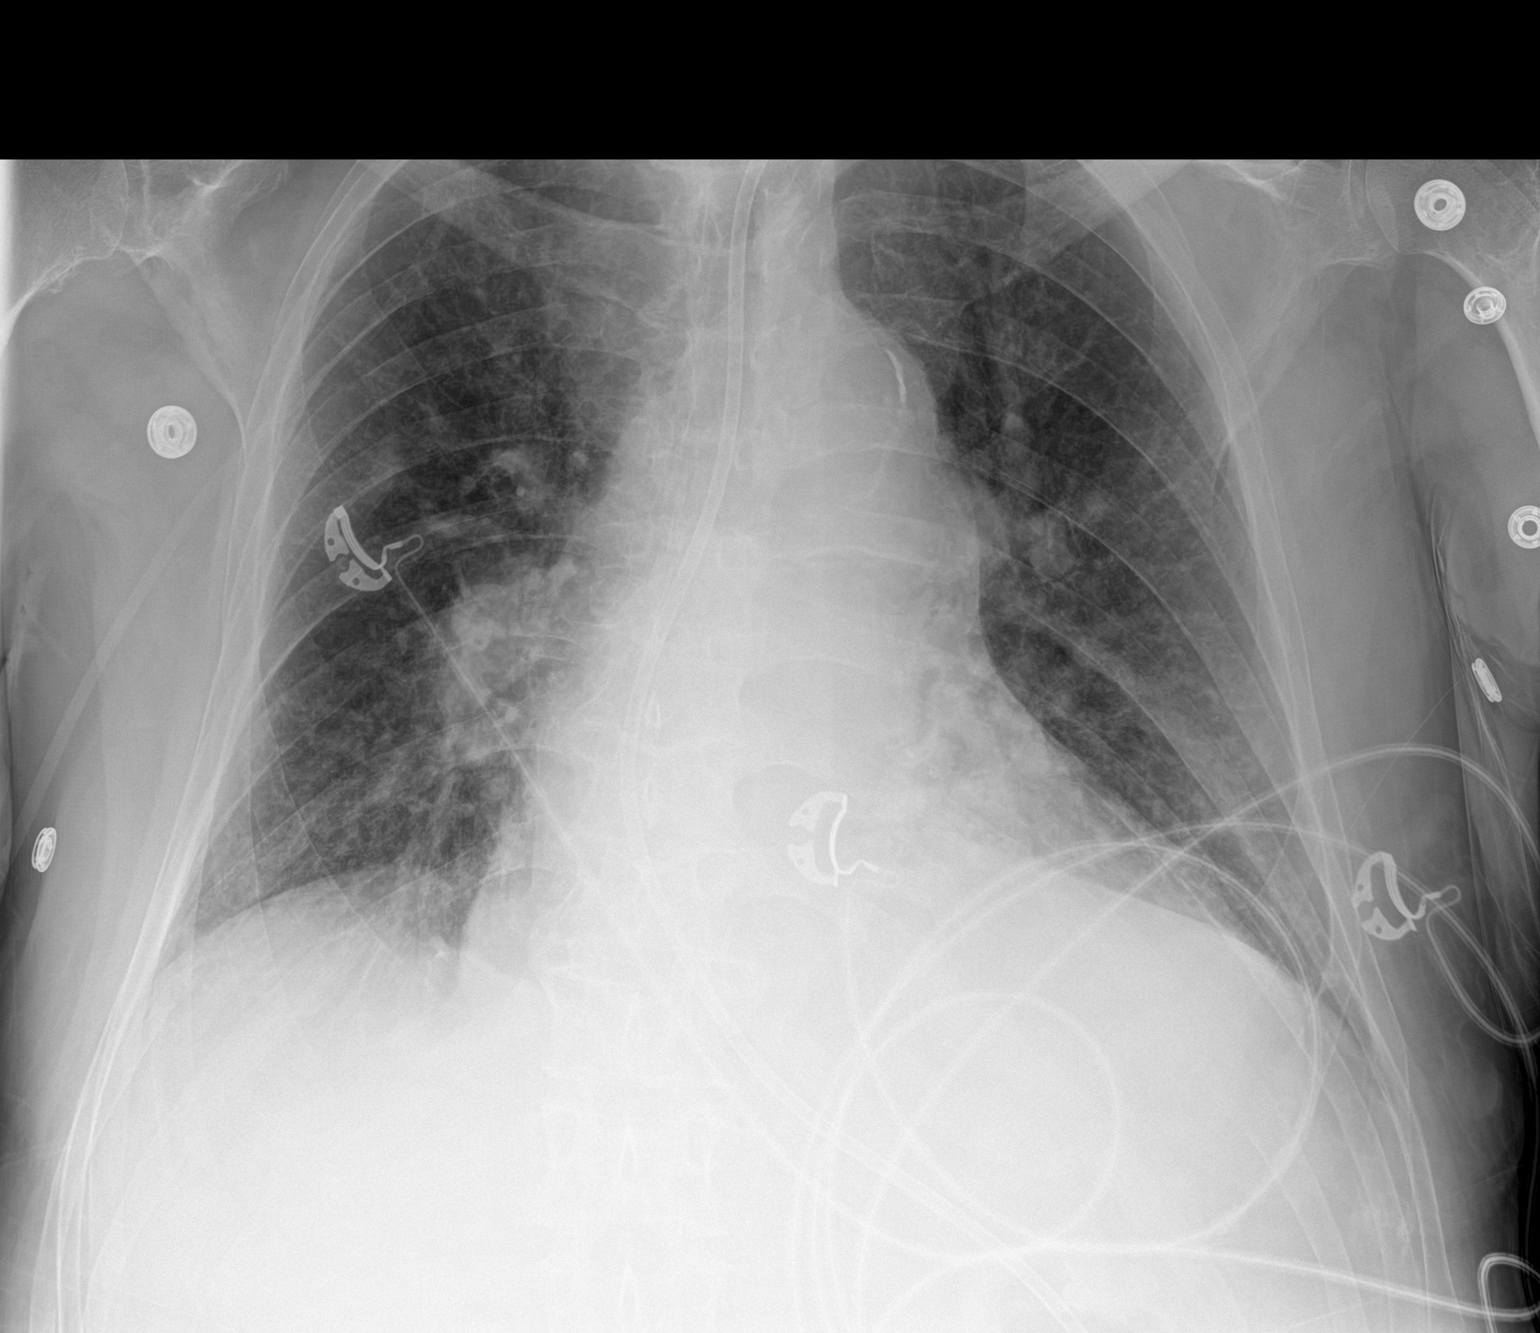

[1 of 1 positions shown; findings below may reference images not displayed]

FINDINGS: Endotracheal tube has been removed. Feeding tube courses below
diaphragm, tip not imaged.

Cardiomegaly.

Calcified tortuous aorta.

Pulmonary vascular prominence most notable centrally.

No segmental consolidation or pneumothorax.

Probable confluent she shadows right upper lobe. Attention to this
on follow-up.

With the degree of central pulmonary vascular prominence and
tortuous aorta, limited for detecting mediastinal adenopathy or
mass.
IMPRESSION: Cardiomegaly.

Pulmonary vascular prominence most notable centrally.

No segmental consolidation.

Calcified slightly tortuous aorta.

Probable confluence of shadows right upper lobe. Attention to this
on follow-up.
